# Patient Record
Sex: Male | Born: 1970 | Race: Black or African American | Hispanic: No | Marital: Single | Smoking: Former smoker
Health system: Southern US, Community
[De-identification: ages and names within clinical notes are randomized; demographics above are authoritative.]

## PROBLEM LIST (undated history)

## (undated) DIAGNOSIS — G4733 Obstructive sleep apnea (adult) (pediatric): Secondary | ICD-10-CM

## (undated) DIAGNOSIS — I10 Essential (primary) hypertension: Secondary | ICD-10-CM

## (undated) DIAGNOSIS — Z8719 Personal history of other diseases of the digestive system: Secondary | ICD-10-CM

## (undated) DIAGNOSIS — K760 Fatty (change of) liver, not elsewhere classified: Secondary | ICD-10-CM

## (undated) DIAGNOSIS — R351 Nocturia: Secondary | ICD-10-CM

## (undated) DIAGNOSIS — N529 Male erectile dysfunction, unspecified: Secondary | ICD-10-CM

## (undated) DIAGNOSIS — N433 Hydrocele, unspecified: Secondary | ICD-10-CM

## (undated) DIAGNOSIS — F329 Major depressive disorder, single episode, unspecified: Secondary | ICD-10-CM

## (undated) DIAGNOSIS — F32A Depression, unspecified: Secondary | ICD-10-CM

## (undated) DIAGNOSIS — K7460 Unspecified cirrhosis of liver: Secondary | ICD-10-CM

## (undated) DIAGNOSIS — Z973 Presence of spectacles and contact lenses: Secondary | ICD-10-CM

## (undated) DIAGNOSIS — D4959 Neoplasm of unspecified behavior of other genitourinary organ: Secondary | ICD-10-CM

## (undated) DIAGNOSIS — N503 Cyst of epididymis: Secondary | ICD-10-CM

## (undated) DIAGNOSIS — IMO0001 Reserved for inherently not codable concepts without codable children: Secondary | ICD-10-CM

## (undated) DIAGNOSIS — J40 Bronchitis, not specified as acute or chronic: Secondary | ICD-10-CM

## (undated) DIAGNOSIS — D869 Sarcoidosis, unspecified: Secondary | ICD-10-CM

## (undated) DIAGNOSIS — I4510 Unspecified right bundle-branch block: Secondary | ICD-10-CM

## (undated) DIAGNOSIS — D860 Sarcoidosis of lung: Secondary | ICD-10-CM

## (undated) DIAGNOSIS — R053 Chronic cough: Secondary | ICD-10-CM

## (undated) DIAGNOSIS — K219 Gastro-esophageal reflux disease without esophagitis: Secondary | ICD-10-CM

## (undated) DIAGNOSIS — D863 Sarcoidosis of skin: Secondary | ICD-10-CM

## (undated) DIAGNOSIS — T7840XA Allergy, unspecified, initial encounter: Secondary | ICD-10-CM

## (undated) HISTORY — PX: LUNG BIOPSY: SHX232

## (undated) HISTORY — DX: Personal history of other diseases of the digestive system: Z87.19

## (undated) HISTORY — DX: Male erectile dysfunction, unspecified: N52.9

## (undated) HISTORY — DX: Depression, unspecified: F32.A

## (undated) HISTORY — DX: Gastro-esophageal reflux disease without esophagitis: K21.9

## (undated) HISTORY — DX: Hydrocele, unspecified: N43.3

## (undated) HISTORY — DX: Major depressive disorder, single episode, unspecified: F32.9

## (undated) HISTORY — DX: Allergy, unspecified, initial encounter: T78.40XA

## (undated) HISTORY — DX: Fatty (change of) liver, not elsewhere classified: K76.0

## (undated) HISTORY — PX: OTHER SURGICAL HISTORY: SHX169

---

## 2010-02-19 ENCOUNTER — Emergency Department (HOSPITAL_BASED_OUTPATIENT_CLINIC_OR_DEPARTMENT_OTHER)
Admission: EM | Admit: 2010-02-19 | Discharge: 2010-02-19 | Payer: Self-pay | Source: Home / Self Care | Admitting: Emergency Medicine

## 2010-02-19 IMAGING — CR DG CHEST 2V
2 series · 2 of 2 positions shown · non-contrast
Comparison: None.

CLINICAL DATA: Chest pain for 1 week.  Cough.  Sarcoidosis.

CHEST - 2 VIEW

[w chest pa]
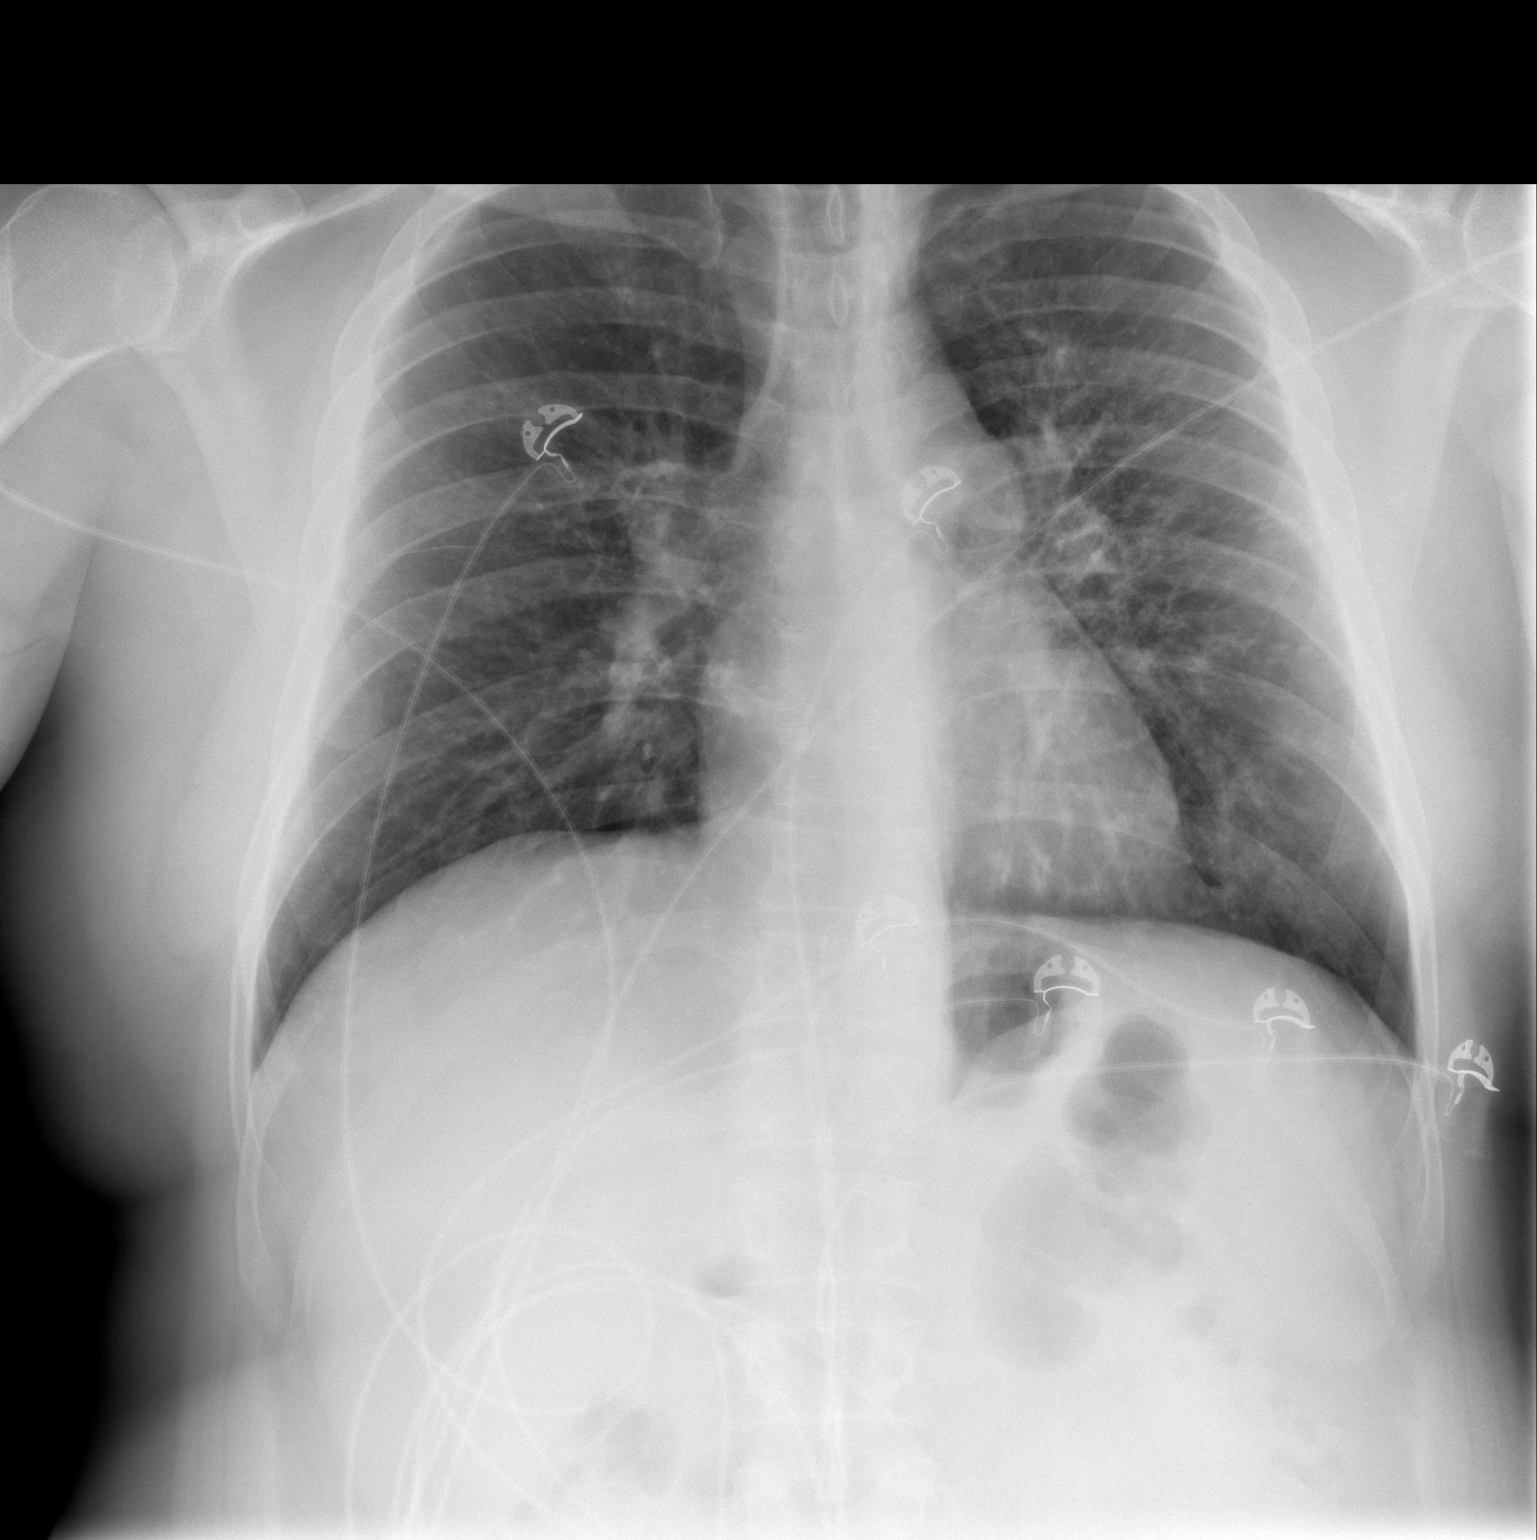

[w chest lat]
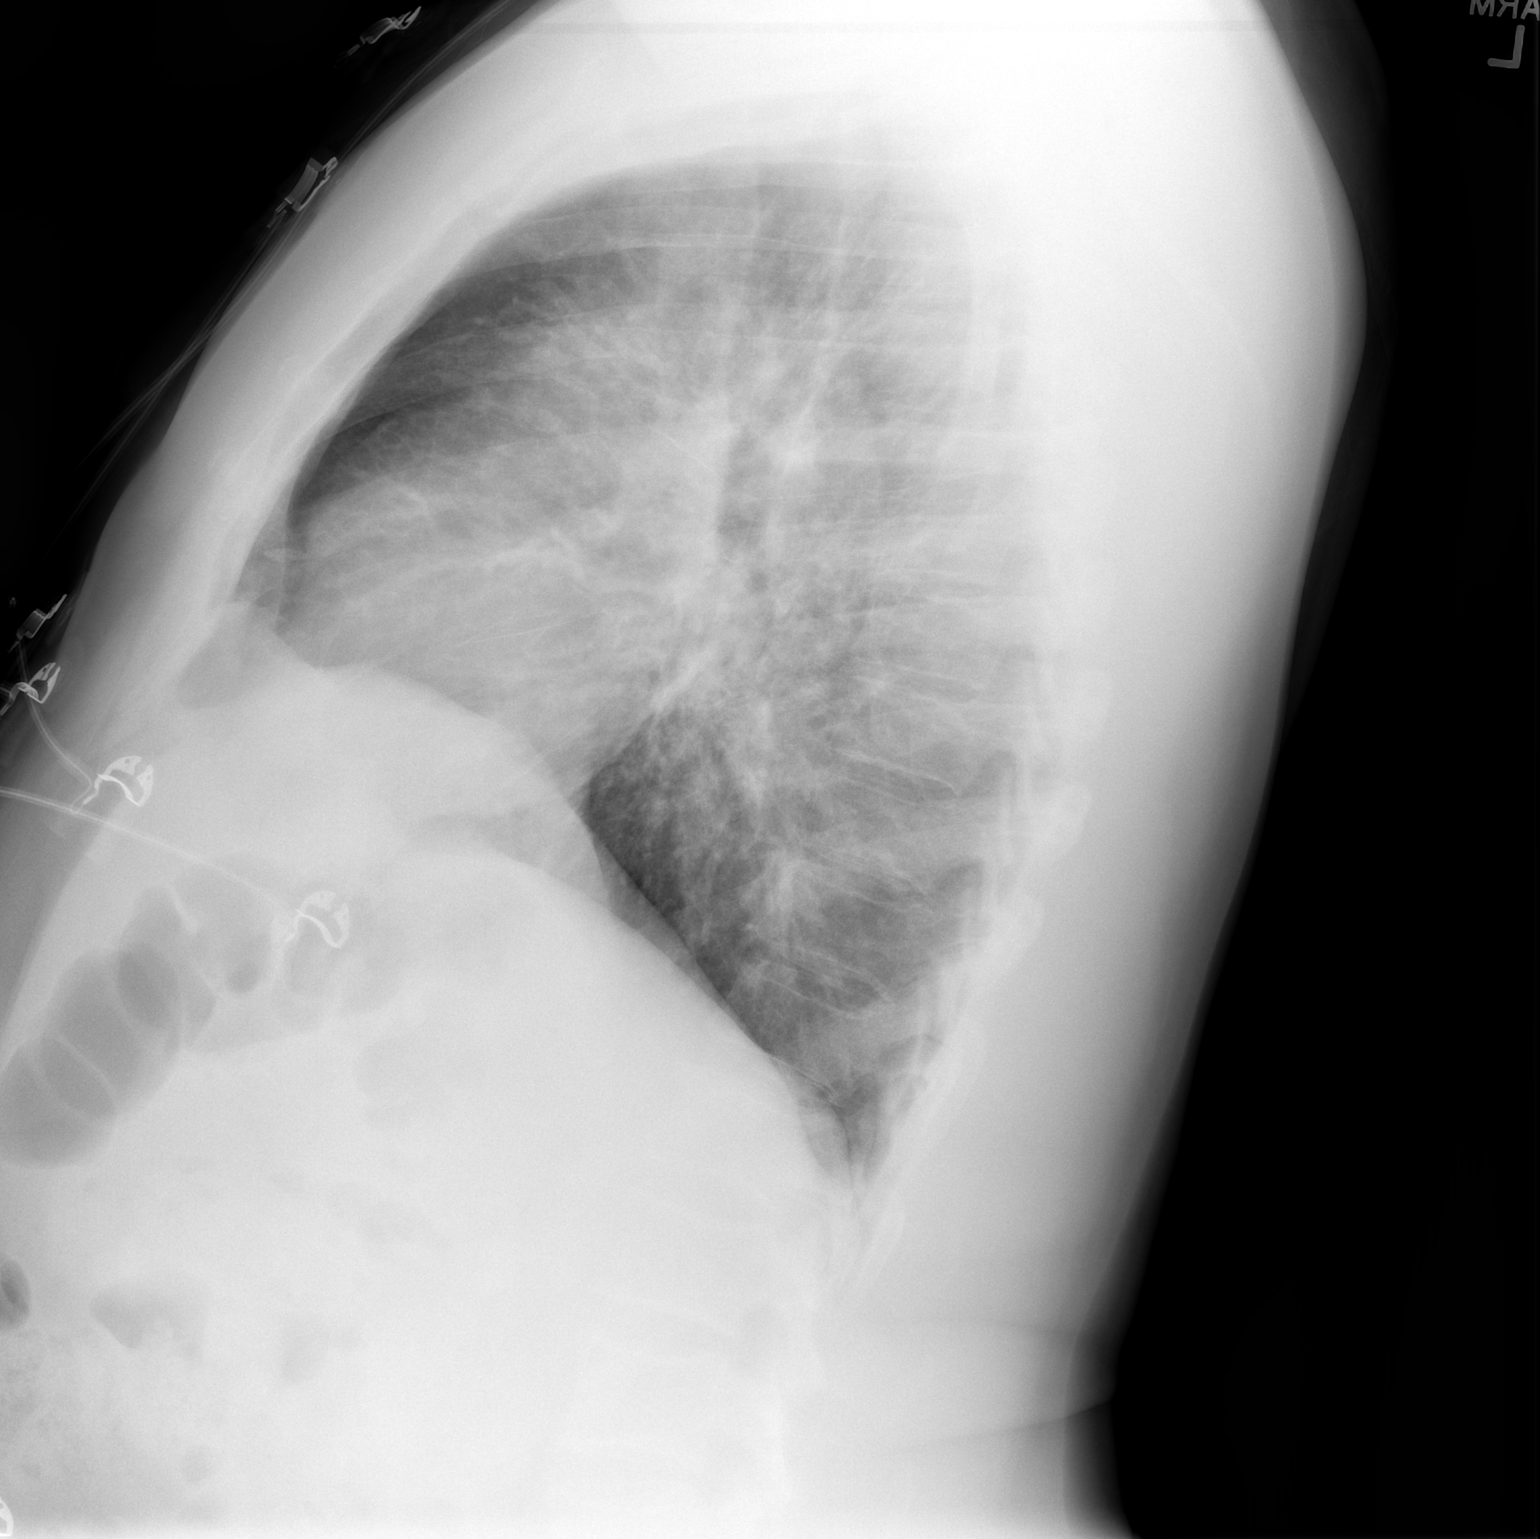

[2 of 2 positions shown; findings below may reference images not displayed]

FINDINGS: There is no comparison film available.  There is patchy
interstitial density more prominent on the left than right.  The
hila are slightly prominent, compatible with stated clinical
history of sarcoidosis.  There is no focal consolidation.
Superimposed pneumonia is difficult to exclude however the overall
appearance is most compatible with pulmonary sarcoidosis with
interstitial changes.
IMPRESSION: 1.  No definite acute cardiopulmonary disease.
2.  Interstitial prominence most compatible with sarcoidosis.
Underlying infection is impossible to exclude but not favored.

## 2010-05-07 LAB — URINALYSIS, ROUTINE W REFLEX MICROSCOPIC
Bilirubin Urine: NEGATIVE
Hgb urine dipstick: NEGATIVE
Ketones, ur: NEGATIVE mg/dL
Nitrite: NEGATIVE
Protein, ur: NEGATIVE mg/dL
Specific Gravity, Urine: 1.019 (ref 1.005–1.030)
Urobilinogen, UA: 0.2 mg/dL (ref 0.0–1.0)
pH: 5.5 (ref 5.0–8.0)

## 2010-05-07 LAB — DIFFERENTIAL
Lymphocytes Relative: 29 % (ref 12–46)
Monocytes Relative: 12 % (ref 3–12)
Neutrophils Relative %: 52 % (ref 43–77)

## 2010-05-07 LAB — CBC
HCT: 45.7 % (ref 39.0–52.0)
Hemoglobin: 15.7 g/dL (ref 13.0–17.0)
MCH: 28.6 pg (ref 26.0–34.0)
MCHC: 34.4 g/dL (ref 30.0–36.0)
MCV: 83.2 fL (ref 78.0–100.0)
RBC: 5.49 MIL/uL (ref 4.22–5.81)

## 2010-05-07 LAB — BASIC METABOLIC PANEL
BUN: 15 mg/dL (ref 6–23)
GFR calc non Af Amer: >60 mL/min (ref >60)
Glucose, Bld: 80 mg/dL (ref 70–99)
Sodium: 144 mEq/L (ref 135–145)

## 2010-08-26 ENCOUNTER — Emergency Department (INDEPENDENT_AMBULATORY_CARE_PROVIDER_SITE_OTHER): Payer: Self-pay

## 2010-08-26 ENCOUNTER — Emergency Department (HOSPITAL_BASED_OUTPATIENT_CLINIC_OR_DEPARTMENT_OTHER)
Admission: EM | Admit: 2010-08-26 | Discharge: 2010-08-26 | Disposition: A | Discharge status: Home / Self Care | Payer: Self-pay | Attending: Emergency Medicine | Admitting: Emergency Medicine

## 2010-08-26 DIAGNOSIS — R05 Cough: Secondary | ICD-10-CM | POA: Insufficient documentation

## 2010-08-26 DIAGNOSIS — R059 Cough, unspecified: Secondary | ICD-10-CM | POA: Insufficient documentation

## 2010-08-26 DIAGNOSIS — R0602 Shortness of breath: Secondary | ICD-10-CM

## 2010-08-26 DIAGNOSIS — R0789 Other chest pain: Secondary | ICD-10-CM

## 2010-08-26 DIAGNOSIS — IMO0002 Reserved for concepts with insufficient information to code with codable children: Secondary | ICD-10-CM | POA: Insufficient documentation

## 2010-08-26 DIAGNOSIS — M25569 Pain in unspecified knee: Secondary | ICD-10-CM

## 2010-08-26 DIAGNOSIS — S82839A Other fracture of upper and lower end of unspecified fibula, initial encounter for closed fracture: Secondary | ICD-10-CM | POA: Insufficient documentation

## 2010-08-26 IMAGING — CR DG KNEE COMPLETE 4+V*R*
4 series · 4 of 4 positions shown · non-contrast
Comparison: None.

CLINICAL DATA: Right knee pain following an injury 1.5 weeks ago.

RIGHT KNEE - COMPLETE 4+ VIEW

[t knee ap right]
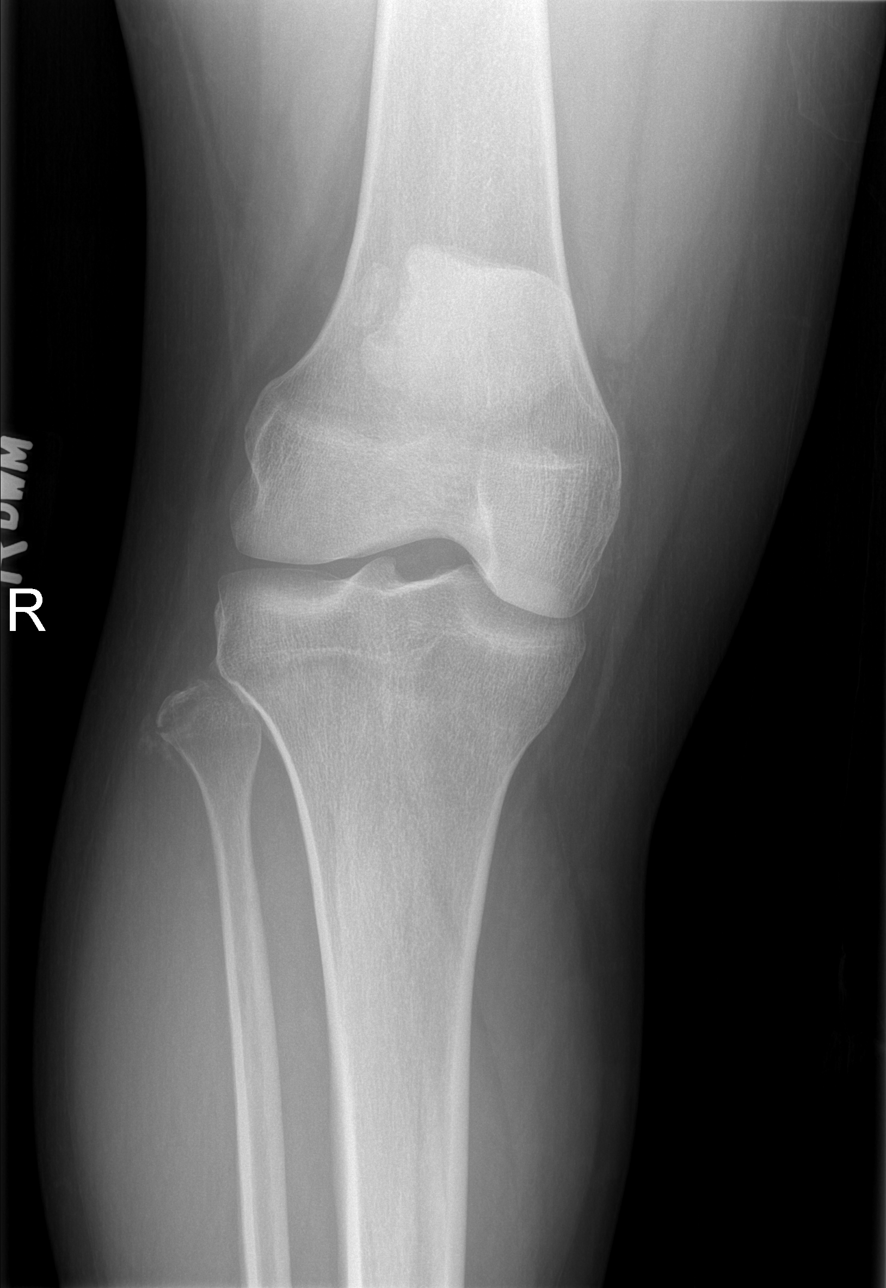

[t knee oblique right (1 of 2)]
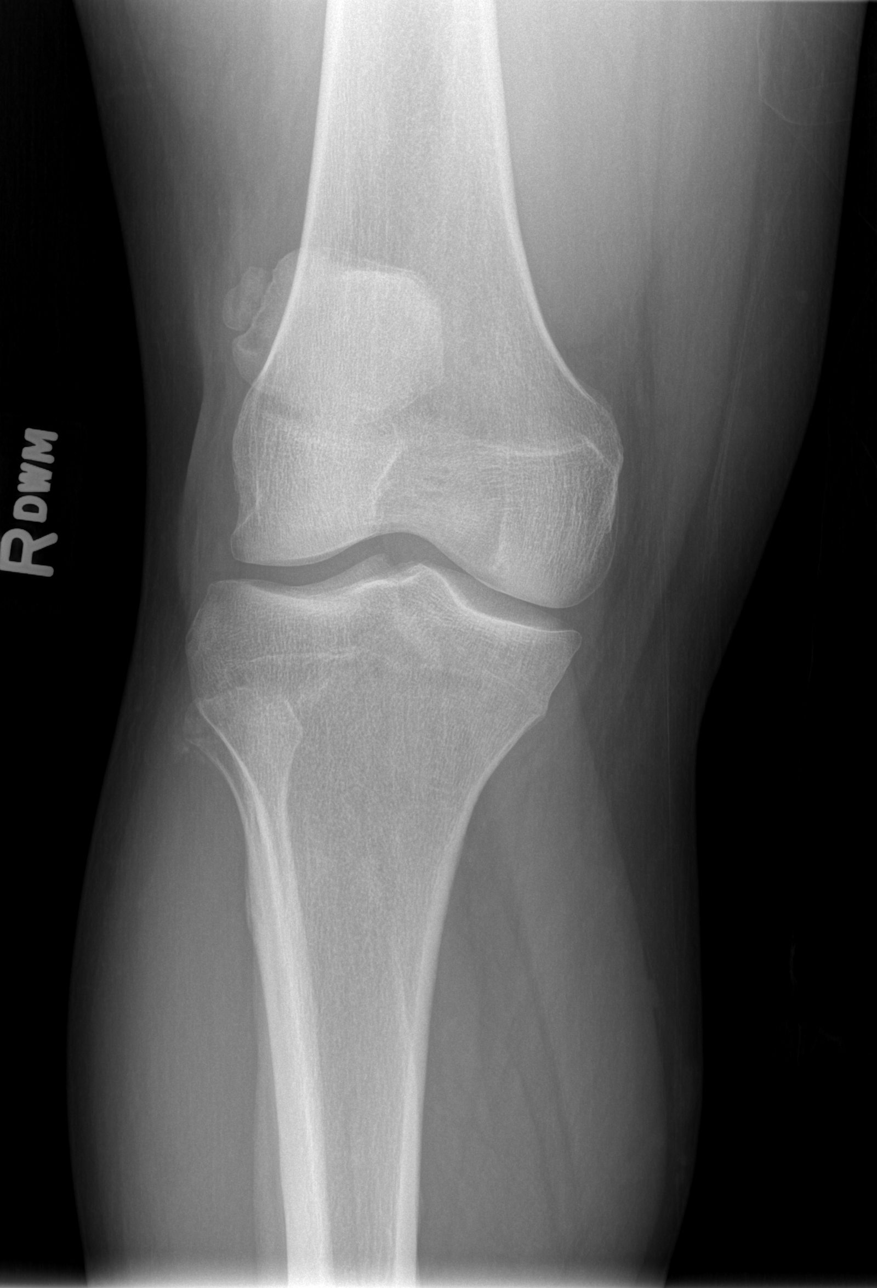

[t knee oblique right (2 of 2)]
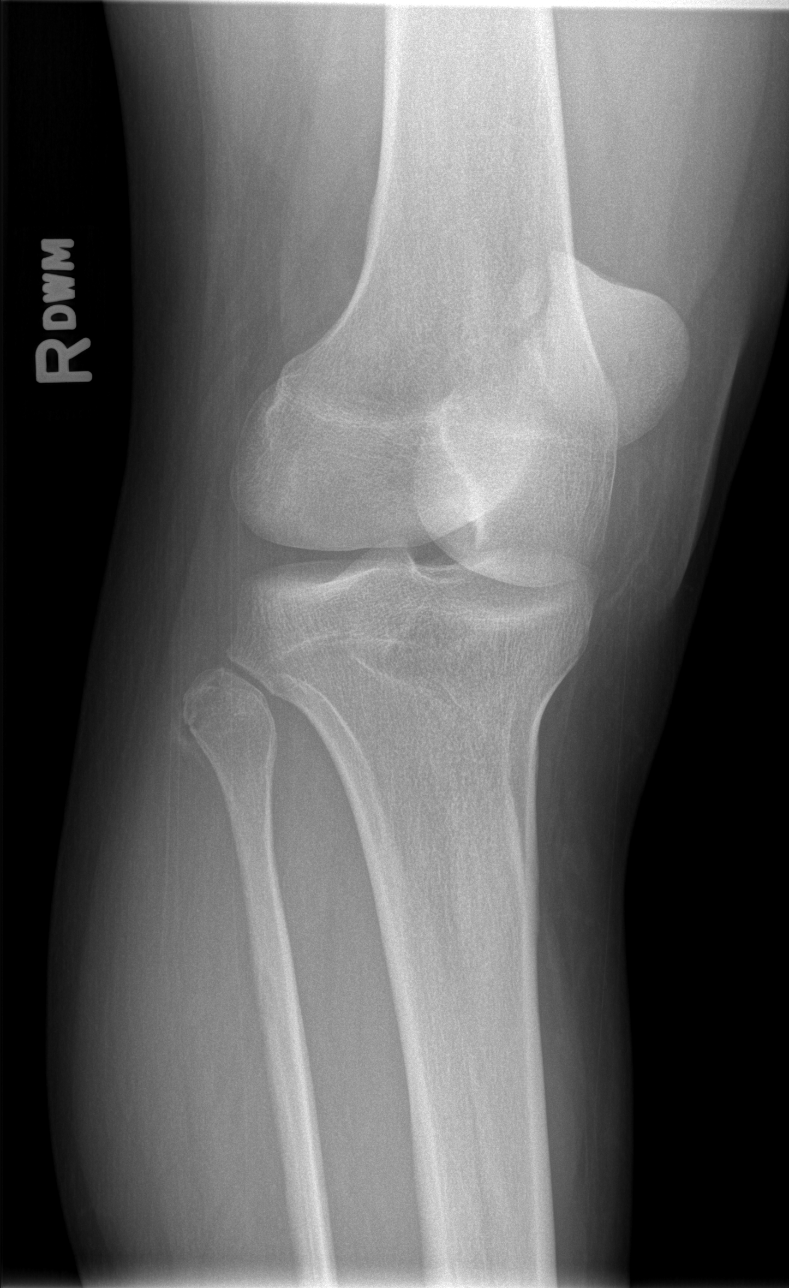

[t knee lat right]
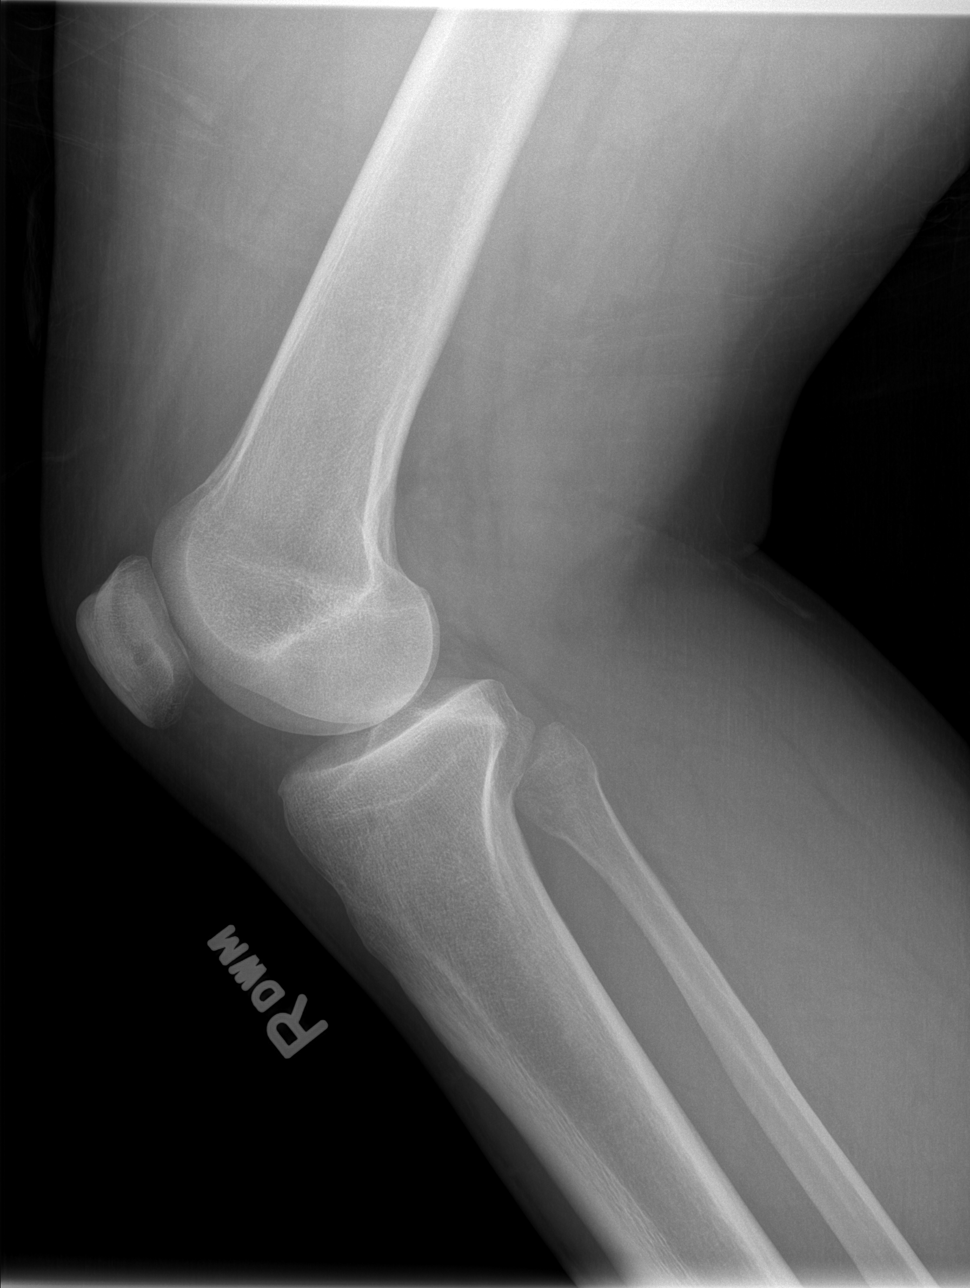

[4 of 4 positions shown; findings below may reference images not displayed]

FINDINGS: Mildly comminuted fracture of the superior aspect of the
fibular head with adjacent soft tissue calcification.  Bipartite
patella.  No effusion.
IMPRESSION: Healing fibular head fracture.

## 2010-08-26 IMAGING — CR DG CHEST 2V
2 series · 2 of 2 positions shown · non-contrast
Comparison: [DATE]

CLINICAL DATA: Chest tightness and short of breath for 3 days.
History sarcoidosis.  Ex-smoker.

CHEST - 2 VIEW

[w chest pa]
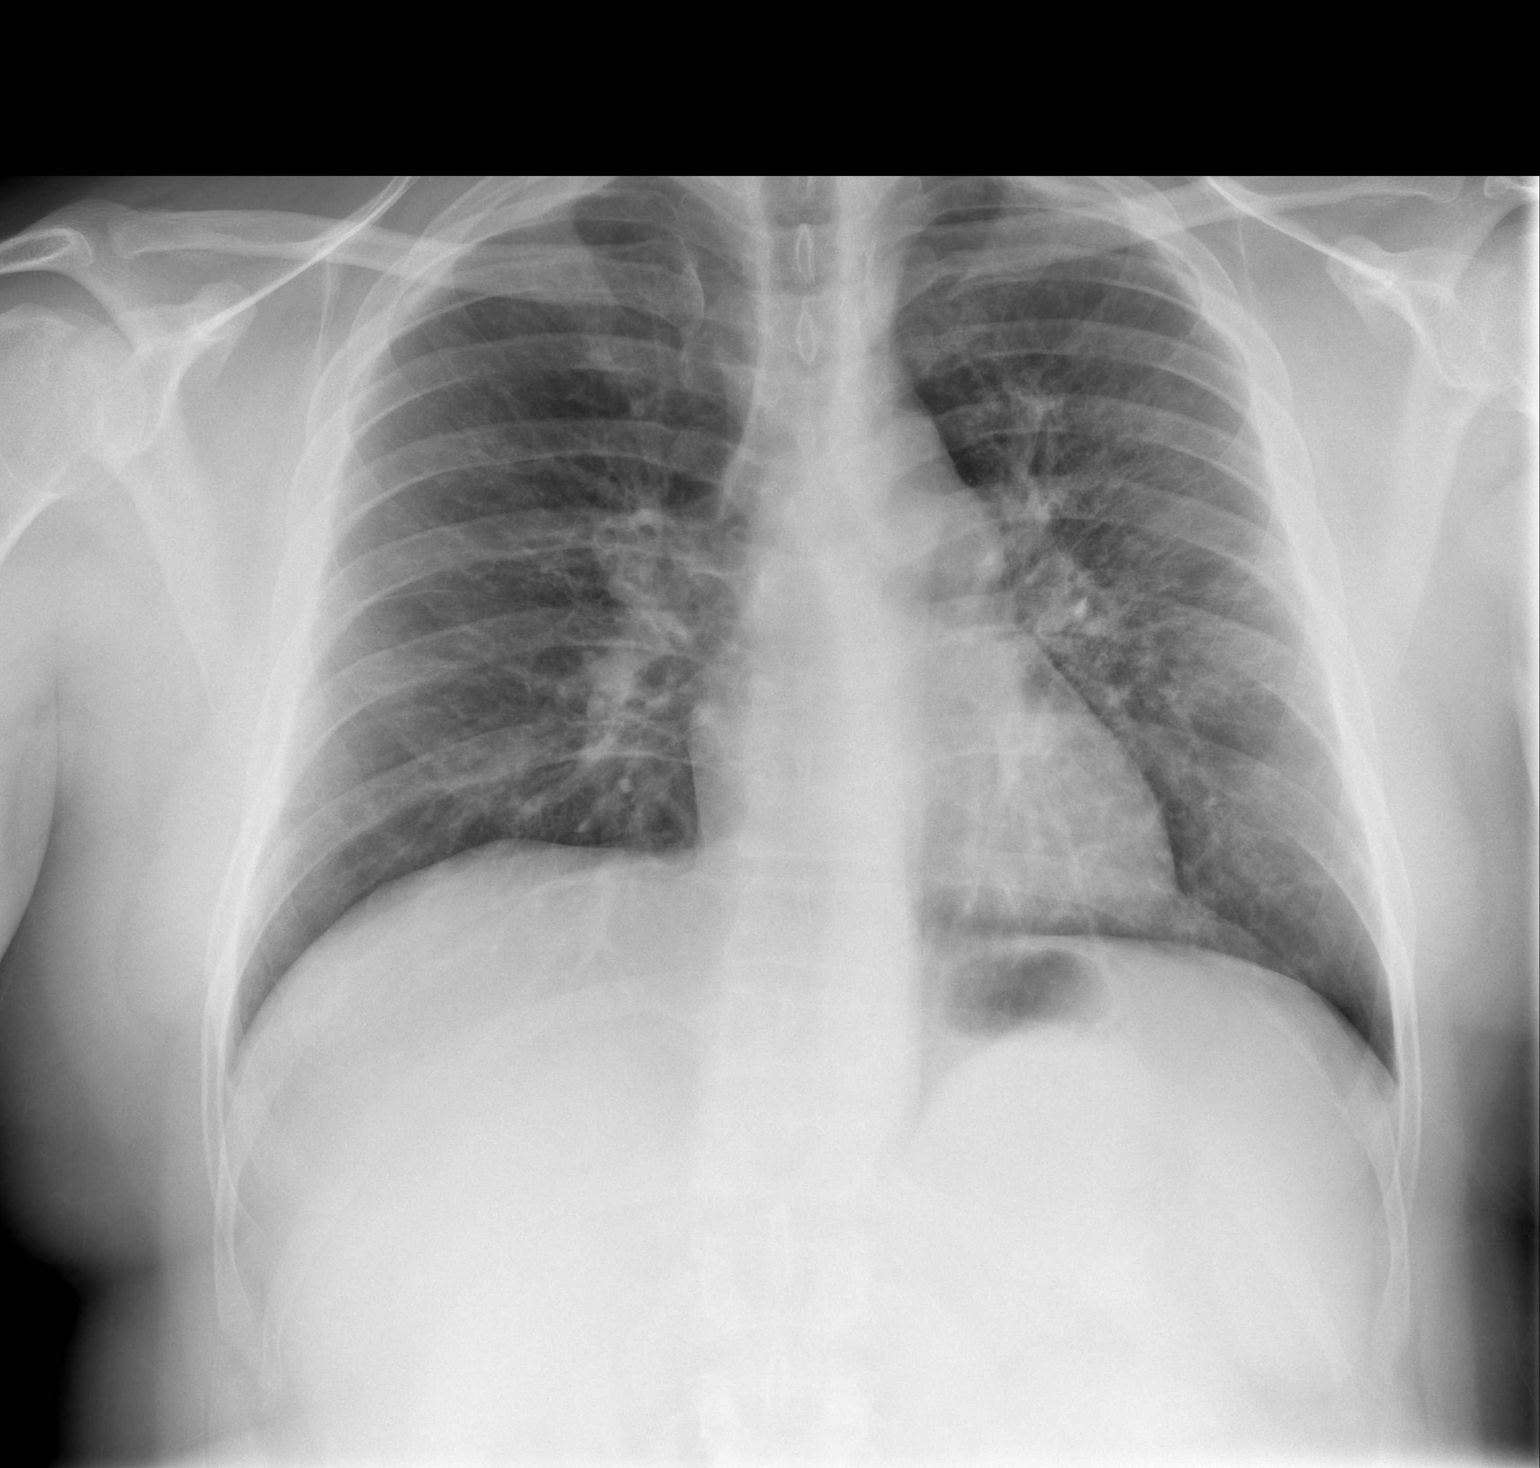

[w chest lat]
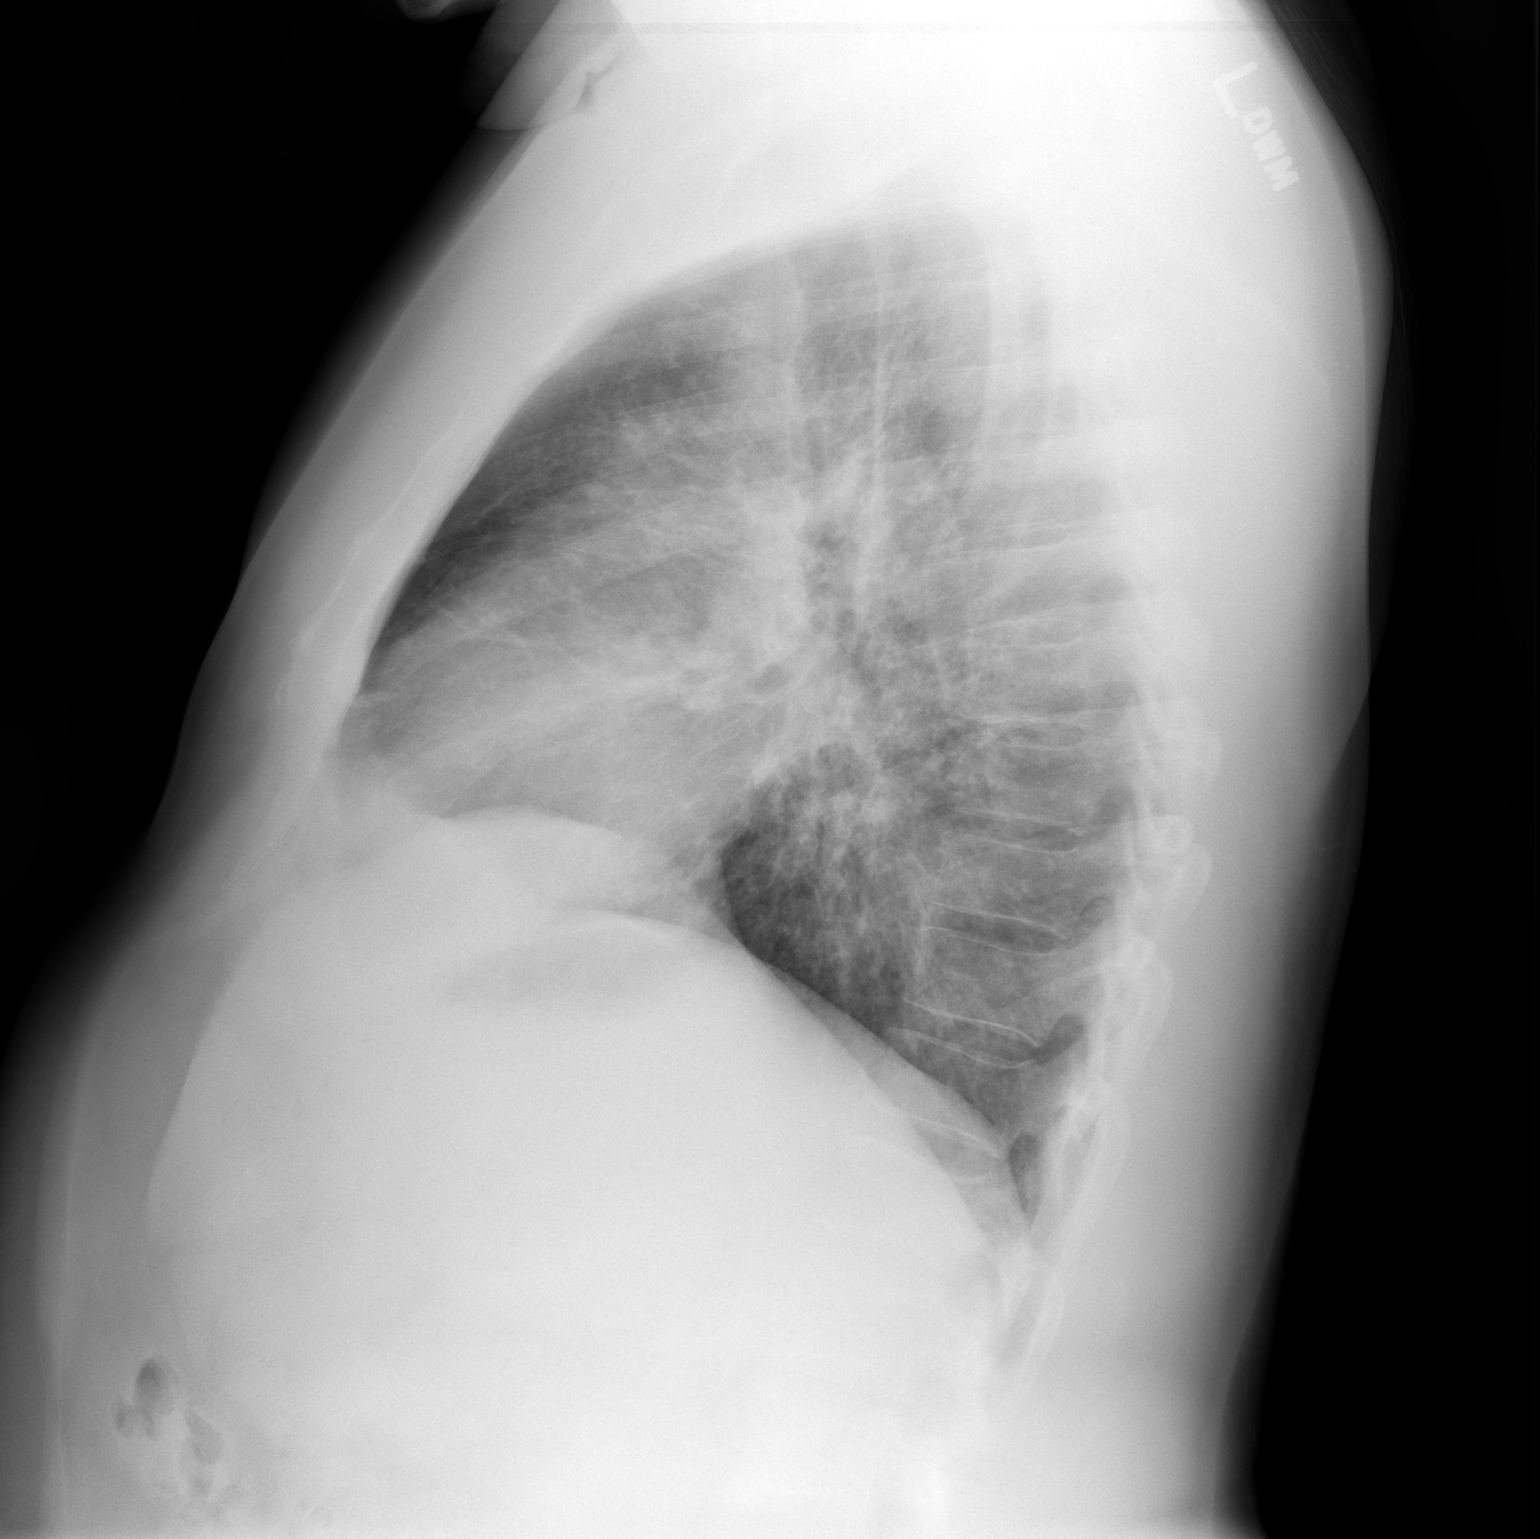

[2 of 2 positions shown; findings below may reference images not displayed]

FINDINGS: Midline trachea.  Normal heart size and mediastinal
contours. No pleural effusion or pneumothorax.  Biapical pleural
thickening.  Moderate diffuse interstitial thickening is similar to
on the prior exam.  Slightly asymmetric, and greater on the left
than right.
IMPRESSION: No change in diffuse interstitial thickening.  Likely related to
the clinical history of sarcoidosis.  No evidence of acute
superimposed process.

## 2011-01-09 ENCOUNTER — Emergency Department (HOSPITAL_BASED_OUTPATIENT_CLINIC_OR_DEPARTMENT_OTHER)
Admission: EM | Admit: 2011-01-09 | Discharge: 2011-01-09 | Disposition: A | Discharge status: Home / Self Care | Payer: Self-pay | Attending: Emergency Medicine | Admitting: Emergency Medicine

## 2011-01-09 ENCOUNTER — Emergency Department (INDEPENDENT_AMBULATORY_CARE_PROVIDER_SITE_OTHER): Payer: Self-pay

## 2011-01-09 ENCOUNTER — Other Ambulatory Visit: Payer: Self-pay

## 2011-01-09 DIAGNOSIS — R0602 Shortness of breath: Secondary | ICD-10-CM | POA: Insufficient documentation

## 2011-01-09 DIAGNOSIS — R079 Chest pain, unspecified: Secondary | ICD-10-CM | POA: Insufficient documentation

## 2011-01-09 DIAGNOSIS — R1011 Right upper quadrant pain: Secondary | ICD-10-CM

## 2011-01-09 DIAGNOSIS — R110 Nausea: Secondary | ICD-10-CM | POA: Insufficient documentation

## 2011-01-09 DIAGNOSIS — R1013 Epigastric pain: Secondary | ICD-10-CM | POA: Insufficient documentation

## 2011-01-09 DIAGNOSIS — K7689 Other specified diseases of liver: Secondary | ICD-10-CM | POA: Insufficient documentation

## 2011-01-09 HISTORY — DX: Sarcoidosis, unspecified: D86.9

## 2011-01-09 LAB — COMPREHENSIVE METABOLIC PANEL
ALT: 32 U/L (ref 0–53)
AST: 53 U/L — ABNORMAL HIGH (ref 0–37)
Alkaline Phosphatase: 83 U/L (ref 39–117)
BUN: 6 mg/dL (ref 6–23)
Calcium: 9.6 mg/dL (ref 8.4–10.5)
Sodium: 136 mEq/L (ref 135–145)
Total Bilirubin: 1.1 mg/dL (ref 0.3–1.2)
Total Protein: 8.8 g/dL — ABNORMAL HIGH (ref 6.0–8.3)

## 2011-01-09 LAB — CBC
MCH: 28.8 pg (ref 26.0–34.0)
MCV: 84.8 fL (ref 78.0–100.0)
Platelets: 210 10*3/uL (ref 150–400)
RBC: 5.6 MIL/uL (ref 4.22–5.81)

## 2011-01-09 LAB — DIFFERENTIAL
Basophils Absolute: 0 10*3/uL (ref 0.0–0.1)
Basophils Relative: 1 % (ref 0–1)
Eosinophils Absolute: 0.2 10*3/uL (ref 0.0–0.7)
Neutro Abs: 3.3 10*3/uL (ref 1.7–7.7)
Neutrophils Relative %: 59 % (ref 43–77)

## 2011-01-09 LAB — URINALYSIS, ROUTINE W REFLEX MICROSCOPIC
Bilirubin Urine: NEGATIVE
Nitrite: NEGATIVE
Protein, ur: NEGATIVE mg/dL
Urobilinogen, UA: 1 mg/dL (ref 0.0–1.0)
pH: 5.5 (ref 5.0–8.0)

## 2011-01-09 LAB — LIPASE, BLOOD: Lipase: 22 U/L (ref 11–59)

## 2011-01-09 LAB — TROPONIN I: Troponin I: <0.3 ng/mL (ref <0.30)

## 2011-01-09 LAB — D-DIMER, QUANTITATIVE: D-Dimer, Quant: <0.22 ug/mL-FEU (ref 0.00–0.48)

## 2011-01-09 IMAGING — CR DG ABDOMEN ACUTE W/ 1V CHEST
3 series · 3 of 3 positions shown · non-contrast
Comparison: [DATE]

CLINICAL DATA: Left-sided chest pain

ACUTE ABDOMEN SERIES (ABDOMEN 2 VIEW & CHEST 1 VIEW)

[w chest pa]
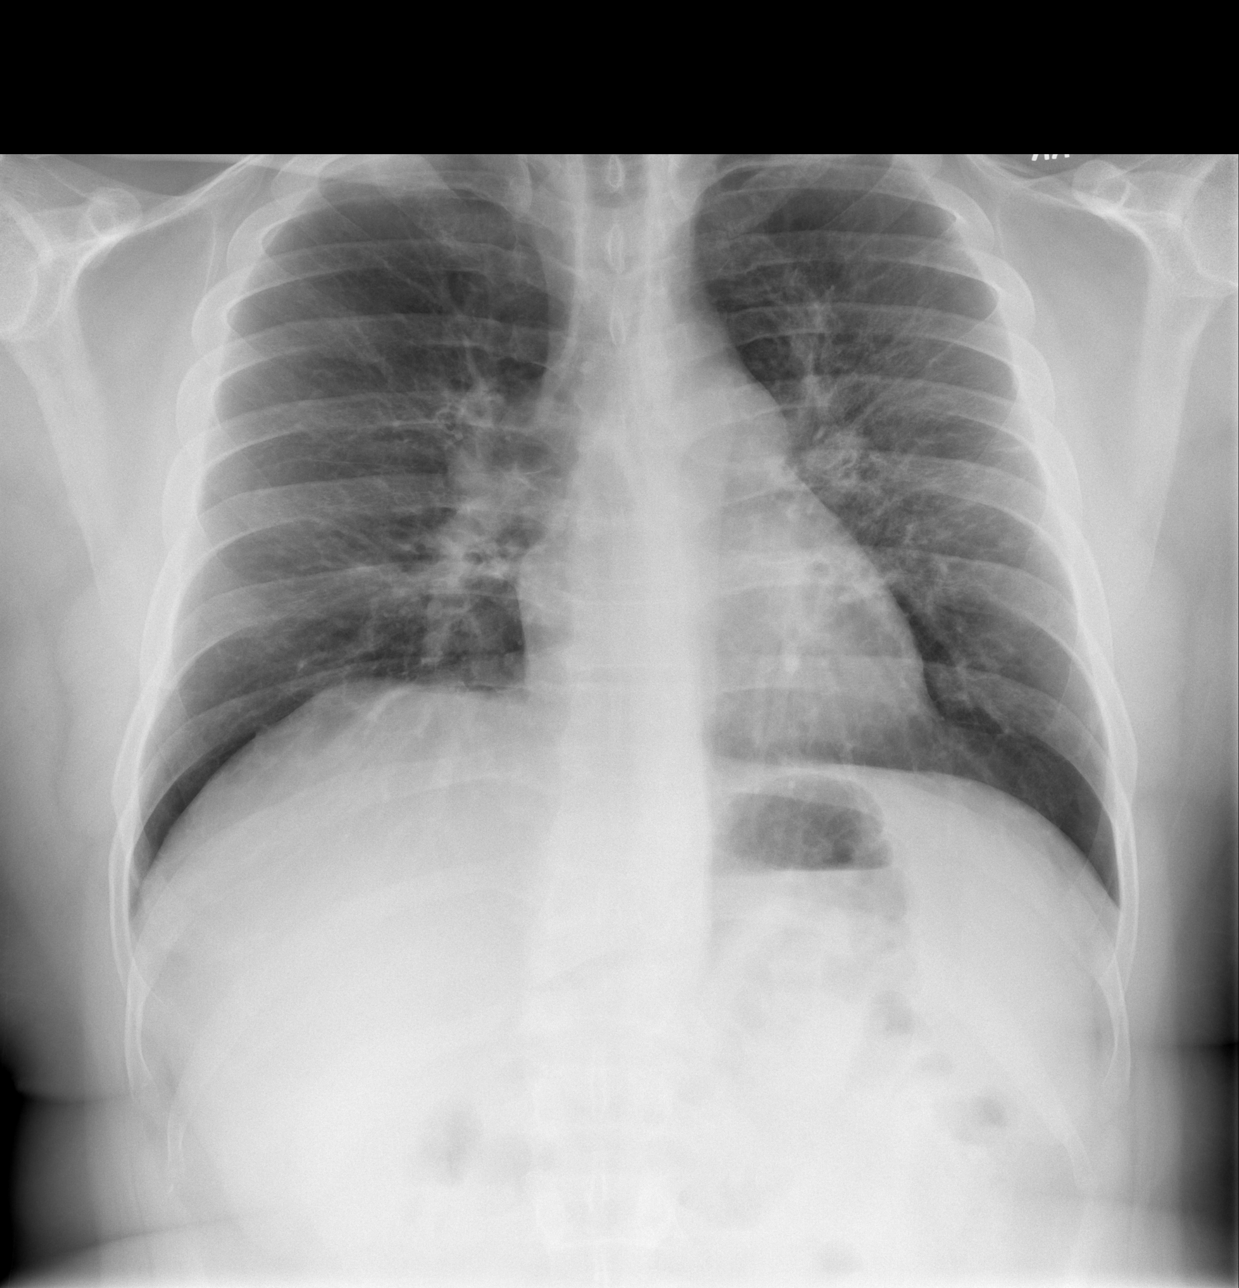

[w abdomen upright]
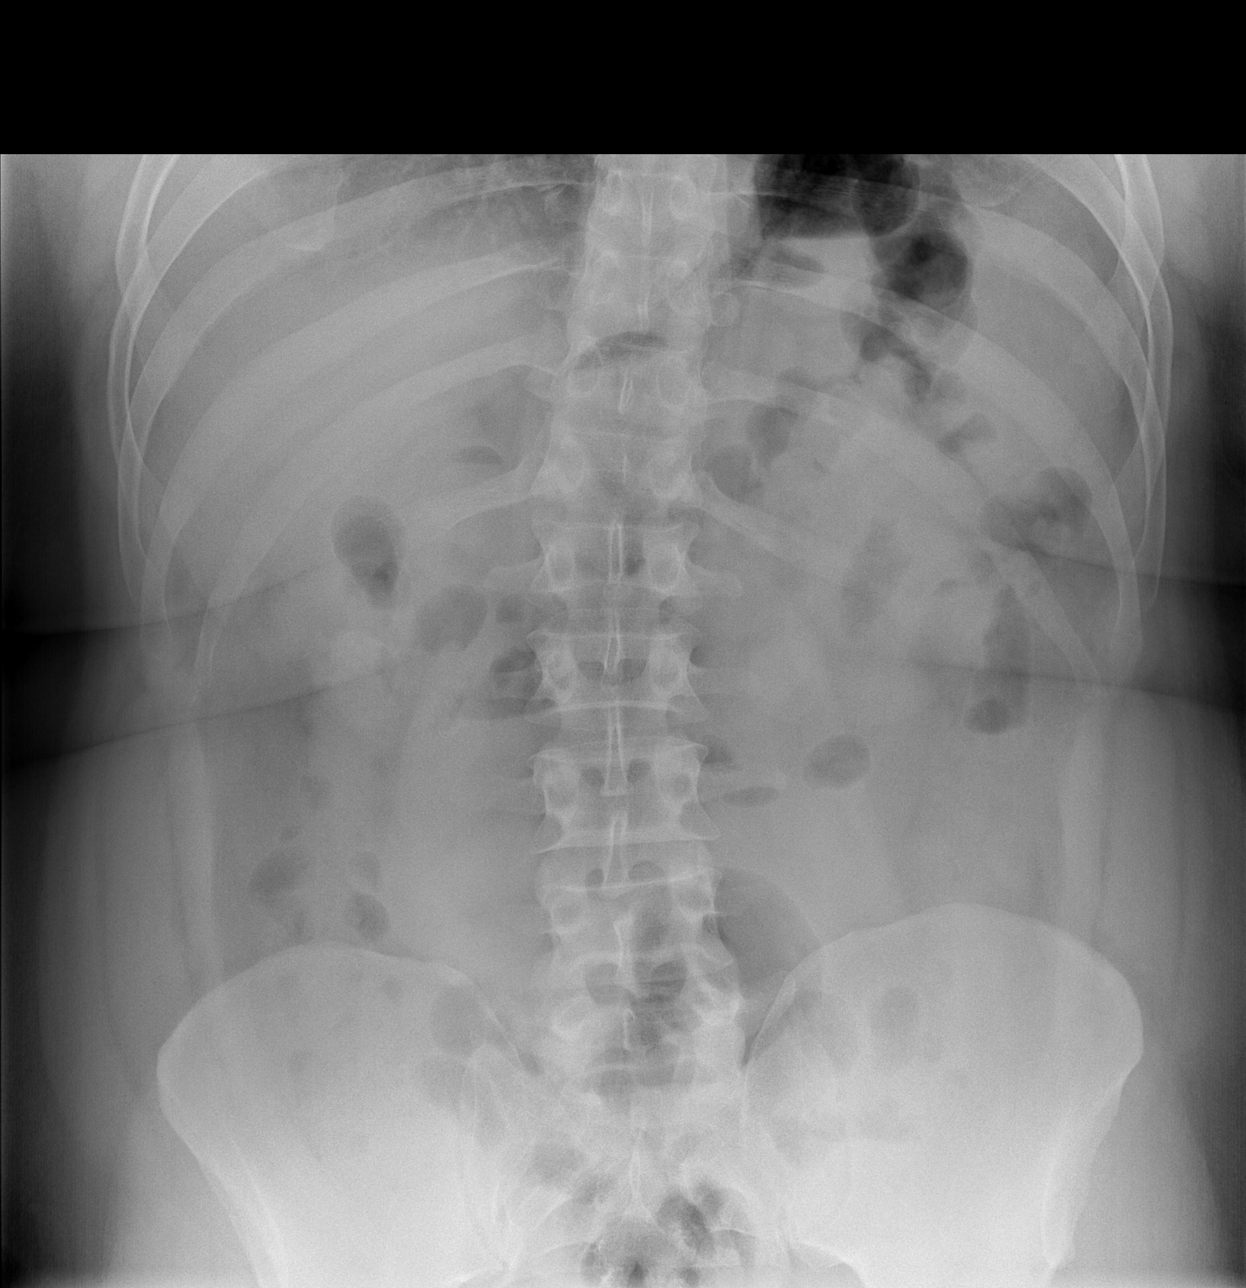

[t abdomen supine]
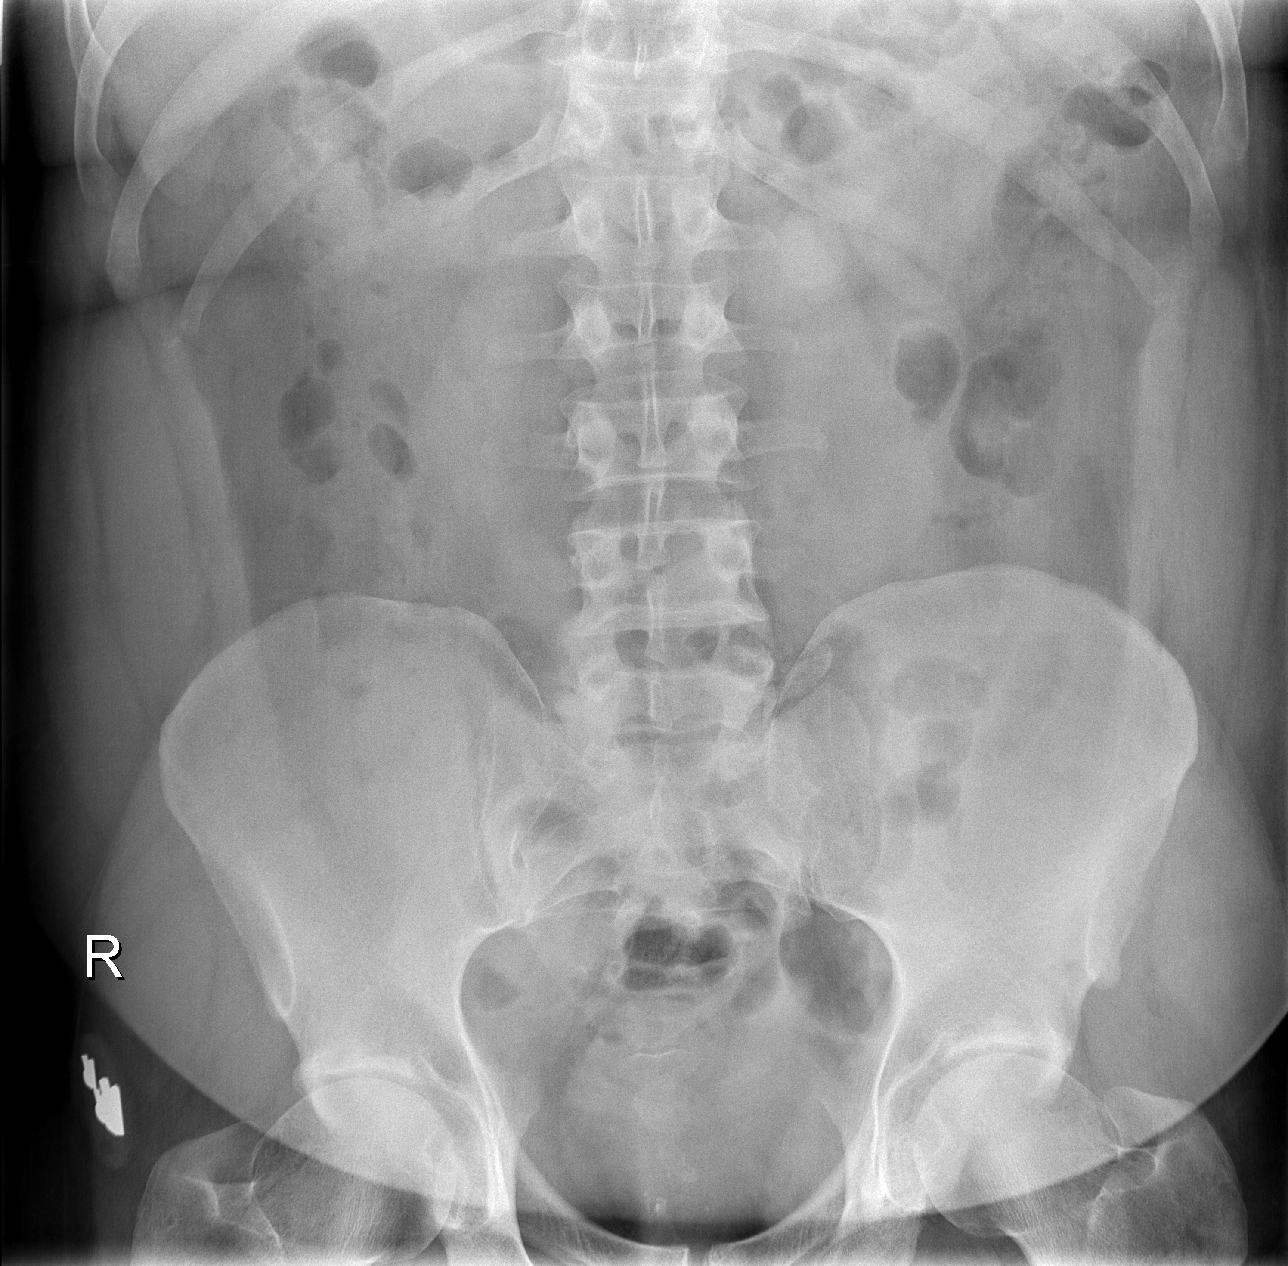

[3 of 3 positions shown; findings below may reference images not displayed]

FINDINGS: Cardiomediastinal silhouette is stable.  Stable diffuse
interstitial thickening and mild perihilar interstitial prominence.
No acute infiltrate or pulmonary edema.

There is nonspecific nonobstructive bowel gas pattern.  No free
abdominal air is noted.
IMPRESSION: Stable diffuse interstitial thickening and mild perihilar
interstitial prominence.  No acute infiltrate or pulmonary edema.
Nonspecific nonobstructive bowel gas pattern.

## 2011-01-09 IMAGING — US US ABDOMEN COMPLETE
1 series · 14 of 25 positions shown · non-contrast
Comparison: Radiography same day

CLINICAL DATA: Right upper quadrant pain.  Epigastric pain.

COMPLETE ABDOMINAL ULTRASOUND

[Series 1: us abdomen complete · 0.32mm/px · 14 of 80 slices shown]
[im 1/80]
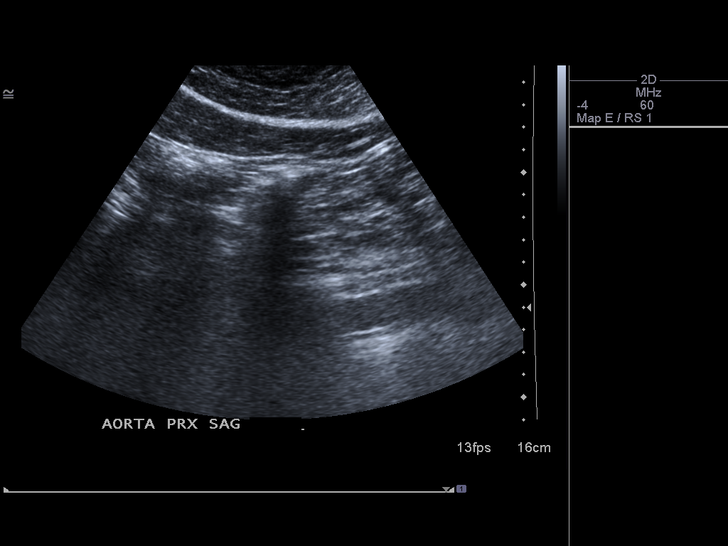
[im 7/80]
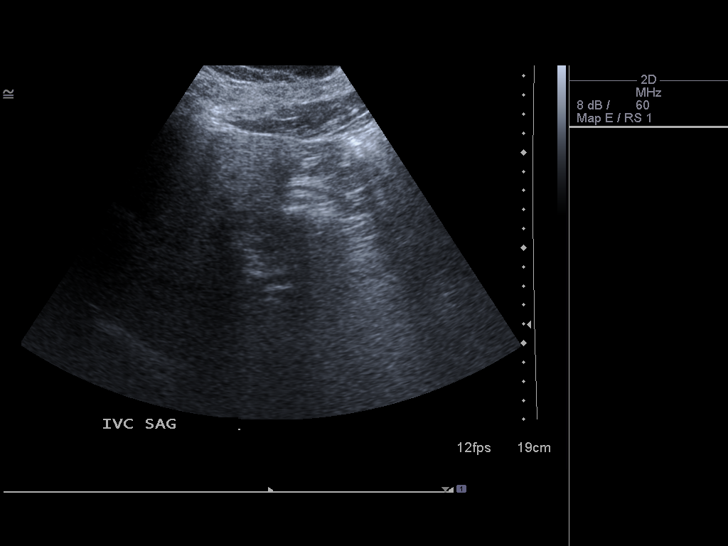
[im 14/80]
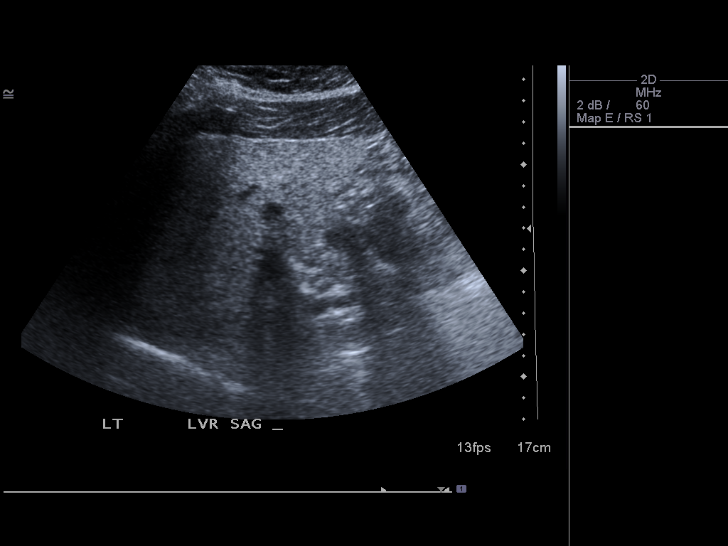
[im 20/80]
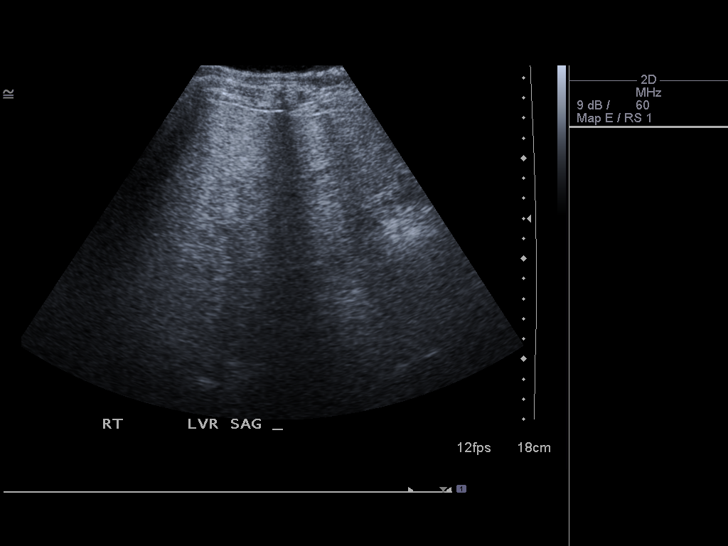
[im 27/80]
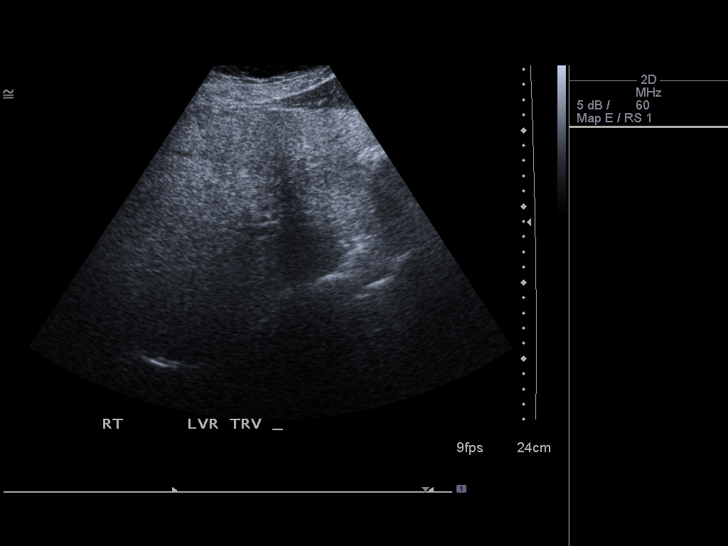
[im 30/80]
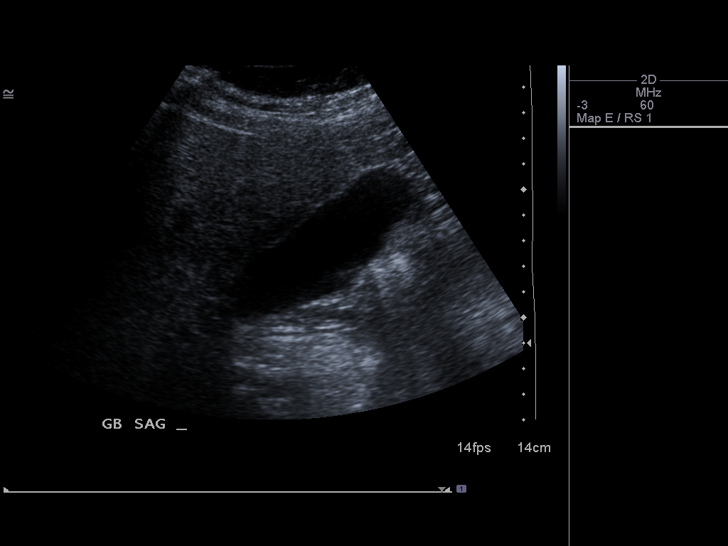
[im 37/80]
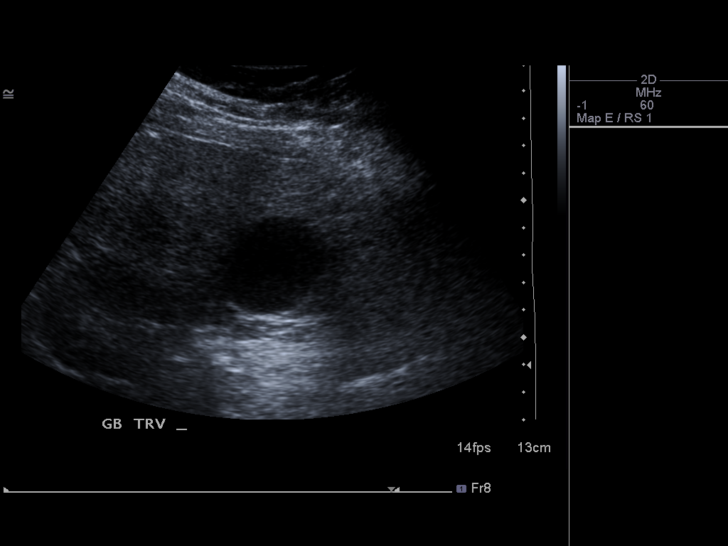
[im 43/80]
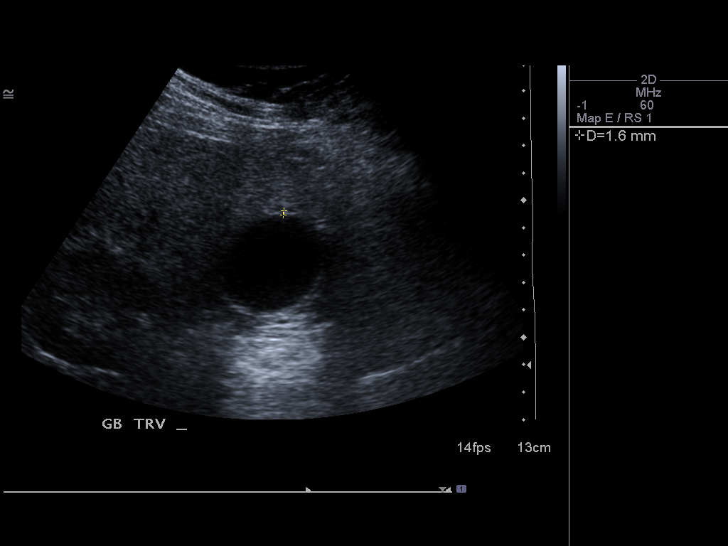
[im 50/80]
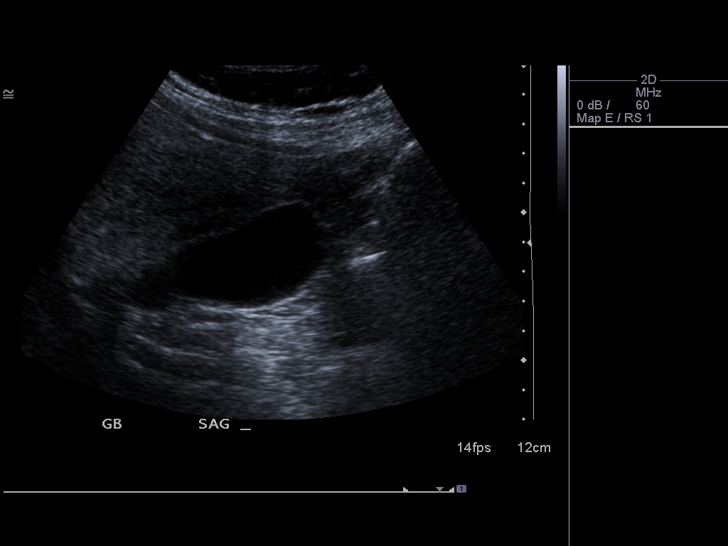
[im 53/80]
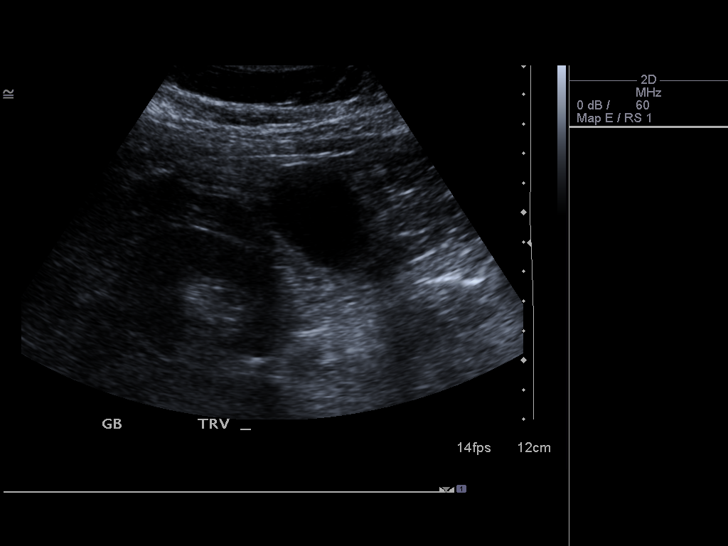
[im 60/80]
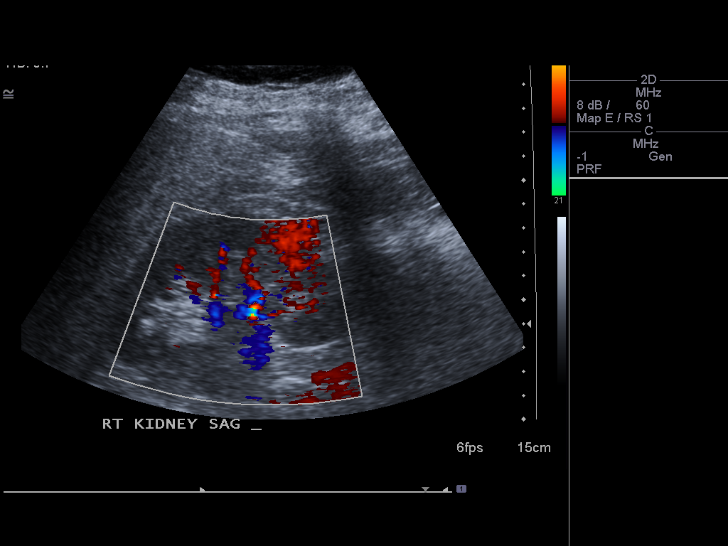
[im 66/80]
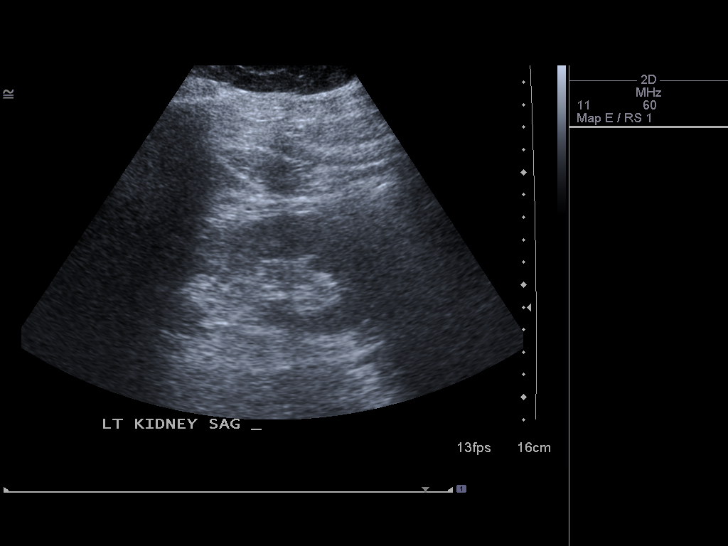
[im 73/80]
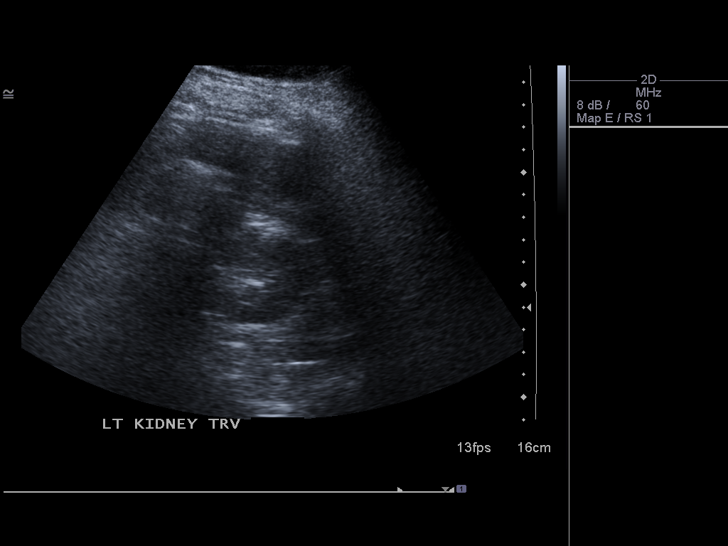
[im 80/80]
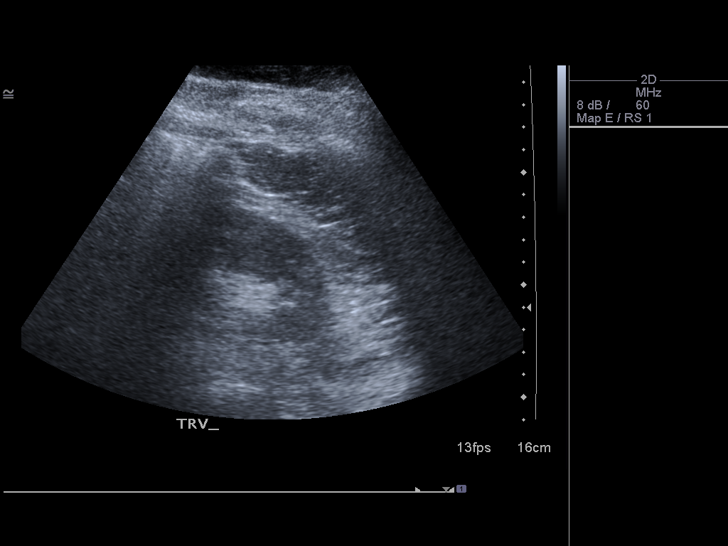

[14 of 25 positions shown; findings below may reference images not displayed]

FINDINGS: Gallbladder:  Normal without stones, sludge or wall thickening.

Common bile duct:  Normal at 2 mm.

Liver:  Echogenic suggesting fatty change.  No focal lesion or
biliary ductal dilatation.

IVC:  Poorly seen because of overlying bowel gas.

Pancreas:  Poorly seen because of overlying bowel gas.

Spleen:  Normal at 10.8 cm.

Right Kidney:  Normal 11.0 cm.  No cyst, mass, stone or
hydronephrosis.

Left Kidney:  Similarly normal at 10.8 cm.

Abdominal aorta:  Poorly seen because of overlying bowel gas.  No
aneurysm identified.

No ascites
IMPRESSION: Fatty liver.  No evidence of biliary tract disease.

Poor visualization of the IVC, pancreas and aorta because of
overlying bowel gas.

## 2011-01-09 MED ORDER — ASPIRIN 325 MG PO TABS
325.0000 mg | ORAL_TABLET | Freq: Once | ORAL | Status: AC
  Administered 2011-01-09: 325 mg via ORAL
  Filled 2011-01-09: qty 1

## 2011-01-09 MED ORDER — ALBUTEROL SULFATE (5 MG/ML) 0.5% IN NEBU
2.5000 mg | INHALATION_SOLUTION | Freq: Once | RESPIRATORY_TRACT | Status: AC
  Administered 2011-01-09: 2.5 mg via RESPIRATORY_TRACT
  Filled 2011-01-09: qty 0.5

## 2011-01-09 MED ORDER — PANTOPRAZOLE SODIUM 40 MG IV SOLR
80.0000 mg | Freq: Once | INTRAVENOUS | Status: AC
  Administered 2011-01-09: 80 mg via INTRAVENOUS
  Filled 2011-01-09: qty 40

## 2011-01-09 MED ORDER — MORPHINE SULFATE 4 MG/ML IJ SOLN
4.0000 mg | Freq: Once | INTRAMUSCULAR | Status: AC
  Administered 2011-01-09: 4 mg via INTRAVENOUS
  Filled 2011-01-09: qty 1

## 2011-01-09 MED ORDER — ONDANSETRON HCL 4 MG/2ML IJ SOLN
4.0000 mg | Freq: Once | INTRAMUSCULAR | Status: AC
  Administered 2011-01-09: 4 mg via INTRAVENOUS
  Filled 2011-01-09: qty 2

## 2011-01-09 MED ORDER — PANTOPRAZOLE SODIUM 20 MG PO TBEC: 20.0000 mg | DELAYED_RELEASE_TABLET | Freq: Every day | ORAL | Status: DC

## 2011-01-09 NOTE — ED Provider Notes (Signed)
History     CSN: [id number] Arrival date & time: 01/09/2011  2:28 PM   First MD Initiated Contact with Patient 01/09/11 1436      Chief Complaint  Patient presents with  . Chest Pain  . Abdominal Pain    (Consider location/radiation/quality/duration/timing/severity/associated sxs/prior treatment) HPI 40 yo M with sarcoidosis presents for evaluation of epigastric abdominal pain and left-sided chest pain for the past week.  Paient has associated cough, SOB, and anorexia with this.  He is not on home O2 but is on prednisone 40 mg po qday.  He has no recent hospitaliaqtions and no history of PE or DVT.  He has had multiple episodes of PNA in the past but says that this feels different.  He denies fever but reports occasional streaks of blood in his stools.  He has not taken anything for his pain.  He does feel the abdominal pain is worse with eating.  He has no prior abdominal surgeries.  He says his pain is an 8/10.  There are not other alleviating or exacerbating factors he can think of.  He denies radiation of his pain and feels like it has gradually gotten worse.  There are not other modifying factor. Past Medical History  Diagnosis Date  . Sarcoidosis     Past Surgical History  Procedure Date  . Arm surgery     History reviewed. No pertinent family history.  History  Substance Use Topics  . Smoking status: Former Games developer  . Smokeless tobacco: Not on file  . Alcohol Use: Yes      Review of Systems  Constitutional: Positive for appetite change.  HENT: Negative.   Eyes: Negative.   Respiratory: Positive for shortness of breath.   Cardiovascular: Positive for chest pain.  Gastrointestinal: Positive for nausea, abdominal pain and blood in stool. Negative for vomiting, diarrhea and constipation.  Genitourinary: Negative.   Musculoskeletal: Negative.   Skin: Negative.   Neurological: Negative.   Hematological: Negative.   Psychiatric/Behavioral: Negative.   All other  systems reviewed and are negative.    Allergies  Review of patient's allergies indicates no known allergies.  Home Medications   Current Outpatient Rx  Name Route Sig Dispense Refill  . ALBUTEROL SULFATE HFA 108 (90 BASE) MCG/ACT IN AERS Inhalation Inhale 2 puffs into the lungs every 6 (six) hours as needed. For shortness of breath and wheezing     . VENTOLIN HFA IN Inhalation Inhale into the lungs as needed.     Marland Kitchen HYDROCODONE-ACETAMINOPHEN 5-325 MG PO TABS Oral Take 1 tablet by mouth every 6 (six) hours as needed.      Marland Kitchen PREDNISONE 20 MG PO TABS Oral Take 20 mg by mouth 2 (two) times daily.      Marland Kitchen PANTOPRAZOLE SODIUM 20 MG PO TBEC Oral Take 1 tablet (20 mg total) by mouth daily. 30 tablet 0    BP 125/103  Pulse 92  Temp(Src) 98.6 F (37 C) (Oral)  Resp 20  Ht 5\' 9"  (1.753 m)  Wt 260 lb (117.935 kg)  BMI 38.40 kg/m2  SpO2 99%  Physical Exam  Nursing note and vitals reviewed. Constitutional: He is oriented to person, place, and time. He appears well-developed and well-nourished. No distress.  HENT:  Head: Normocephalic and atraumatic.  Eyes: Conjunctivae and EOM are normal. Pupils are equal, round, and reactive to light.  Neck: Normal range of motion.  Cardiovascular: Normal rate, regular rhythm, normal heart sounds and intact distal pulses.  Exam reveals no  gallop and no friction rub.   No murmur heard. Pulmonary/Chest: Effort normal and breath sounds normal. No respiratory distress. He has no wheezes. He has no rales.  Abdominal: Soft. Bowel sounds are normal. He exhibits no distension. There is tenderness in the epigastric area. There is no rigidity, no rebound, no guarding and negative Murphy's sign.  Musculoskeletal: Normal range of motion. He exhibits no edema and no tenderness.  Neurological: He is alert and oriented to person, place, and time. No cranial nerve deficit. He exhibits normal muscle tone. Coordination normal.  Skin: Skin is warm and dry. No rash noted.    Psychiatric: He has a normal mood and affect.    ED Course  Procedures (including critical care time)  Date: 01/09/2011  Rate: 81Rhythm: normal sinus rhythm  QRS Axis: normal  Intervals: normal  ST/T Wave abnormalities: nonspecific T wave changes  Conduction Disutrbances:none  Narrative Interpretation: no acute ischemic changes  Old EKG Reviewed: unchanged  Labs Reviewed  CARDIAC PANEL(CRET KIN+CKTOT+MB+TROPI) - Abnormal; Notable for the following:    Total CK 831 (*)    CK, MB 6.6 (*)    All other components within normal limits  COMPREHENSIVE METABOLIC PANEL - Abnormal; Notable for the following:    Glucose, Bld 126 (*)    Total Protein 8.8 (*)    AST 53 (*)    All other components within normal limits  URINALYSIS, ROUTINE W REFLEX MICROSCOPIC - Abnormal; Notable for the following:    Ketones, ur 15 (*)    All other components within normal limits  DIFFERENTIAL - Abnormal; Notable for the following:    Monocytes Relative 13 (*)    All other components within normal limits  CBC  LIPASE, BLOOD  PRO B NATRIURETIC PEPTIDE  D-DIMER, QUANTITATIVE  TROPONIN I   US Abdomen Complete  01/09/2011  *RADIOLOGY REPORT*  Clinical Data:  Right upper quadrant pain.  Epigastric pain.  COMPLETE ABDOMINAL ULTRASOUND  Comparison:  Radiography same day  Findings:  Gallbladder:  Normal without stones, sludge or wall thickening.  Common bile duct:  Normal at 2 mm.  Liver:  Echogenic suggesting fatty change.  No focal lesion or biliary ductal dilatation.  IVC:  Poorly seen because of overlying bowel gas.  Pancreas:  Poorly seen because of overlying bowel gas.  Spleen:  Normal at 10.8 cm.  Right Kidney:  Normal 11.0 cm.  No cyst, mass, stone or hydronephrosis.  Left Kidney:  Similarly normal at 10.8 cm.  Abdominal aorta:  Poorly seen because of overlying bowel gas.  No aneurysm identified.  No ascites  IMPRESSION: Fatty liver.  No evidence of biliary tract disease.  Poor visualization of the IVC,  pancreas and aorta because of overlying bowel gas.  Original Report Authenticated By: Thomasenia Sales, M.D.   Dg Abd Acute W/chest  01/09/2011  *RADIOLOGY REPORT*  Clinical Data: Left-sided chest pain  ACUTE ABDOMEN SERIES (ABDOMEN 2 VIEW & CHEST 1 VIEW)  Comparison: 08/26/2010  Findings: Cardiomediastinal silhouette is stable.  Stable diffuse interstitial thickening and mild perihilar interstitial prominence. No acute infiltrate or pulmonary edema.  There is nonspecific nonobstructive bowel gas pattern.  No free abdominal air is noted.  IMPRESSION: Stable diffuse interstitial thickening and mild perihilar interstitial prominence.  No acute infiltrate or pulmonary edema. Nonspecific nonobstructive bowel gas pattern.  Original Report Authenticated By: Natasha Mead, M.D.     1. Epigastric abdominal pain       MDM  Paient was evaluated by myself.  He  had an ECG that was unchanged from previous.  Given his chest pain a TNI, CBC, and d-imer were ordered.  CK and CK-MB were already ordered per protocol and were mildly abnormal owing mainly to patient elevated total CK.  Paient did receive ASA.  Paient was given morphine and nausea medicine.  D-dimer and CXR were unremarkable.  Given abdominal pain protonix was given.  CBC, lipase, UA, and LFT's were unrevealing.  Patient was given a gI cocktail.  He had an US of the RUQ given the timing of his symptoms with eating and this was normal.  While here patient was also given a albuterol neb with improvement of his SOB as well.  Paient overall felt much better and had a completely benign work-up.  I spoke with his pulmonologist, Dr. Ysidro Evert who was in agreement that patient could stay on current prednisone.  Final TNI at 3 hours was normal.  Since patient improved mainly with GI cocktail and protonix a Rx for protonix was given.  Paient can follow-up with his PCP or return for other emergent concerns.       Cyndra Numbers, MD 01/09/11 [phone]

## 2011-01-09 NOTE — ED Notes (Signed)
C/o left side CP-and mid abd pain x 1 week

## 2011-03-25 ENCOUNTER — Emergency Department (HOSPITAL_BASED_OUTPATIENT_CLINIC_OR_DEPARTMENT_OTHER)
Admission: EM | Admit: 2011-03-25 | Discharge: 2011-03-25 | Disposition: A | Discharge status: Home / Self Care | Payer: Self-pay | Attending: Emergency Medicine | Admitting: Emergency Medicine

## 2011-03-25 ENCOUNTER — Emergency Department (INDEPENDENT_AMBULATORY_CARE_PROVIDER_SITE_OTHER): Payer: Self-pay

## 2011-03-25 ENCOUNTER — Encounter (HOSPITAL_BASED_OUTPATIENT_CLINIC_OR_DEPARTMENT_OTHER): Payer: Self-pay

## 2011-03-25 DIAGNOSIS — R509 Fever, unspecified: Secondary | ICD-10-CM

## 2011-03-25 DIAGNOSIS — R05 Cough: Secondary | ICD-10-CM

## 2011-03-25 DIAGNOSIS — J069 Acute upper respiratory infection, unspecified: Secondary | ICD-10-CM

## 2011-03-25 DIAGNOSIS — J111 Influenza due to unidentified influenza virus with other respiratory manifestations: Secondary | ICD-10-CM | POA: Insufficient documentation

## 2011-03-25 DIAGNOSIS — R079 Chest pain, unspecified: Secondary | ICD-10-CM | POA: Insufficient documentation

## 2011-03-25 IMAGING — CR DG CHEST 2V
2 series · 2 of 2 positions shown · non-contrast
Comparison: [DATE]

CLINICAL DATA: Low grade fever, cough, and left-sided chest pain.

CHEST - 2 VIEW

[w chest pa]
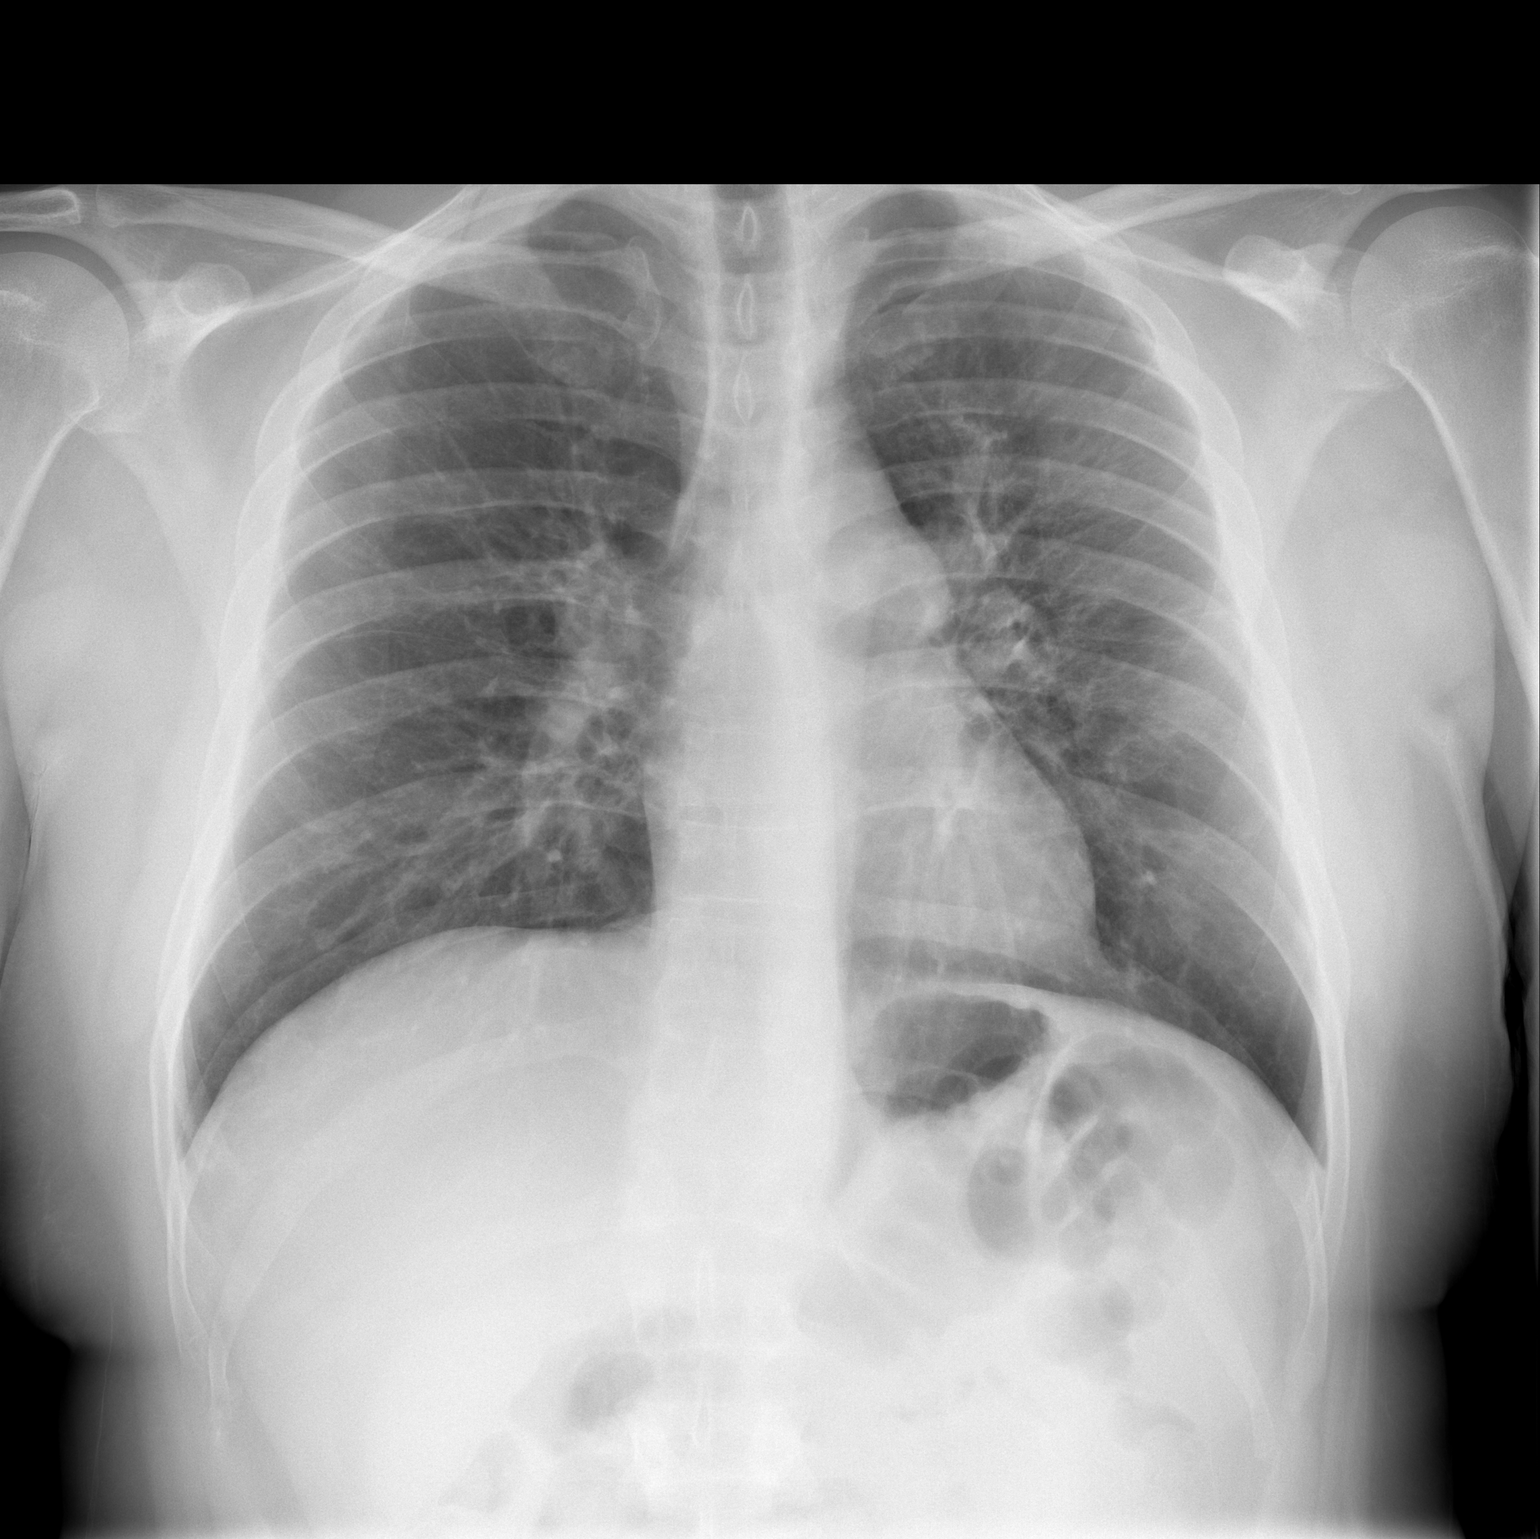

[w chest lat]
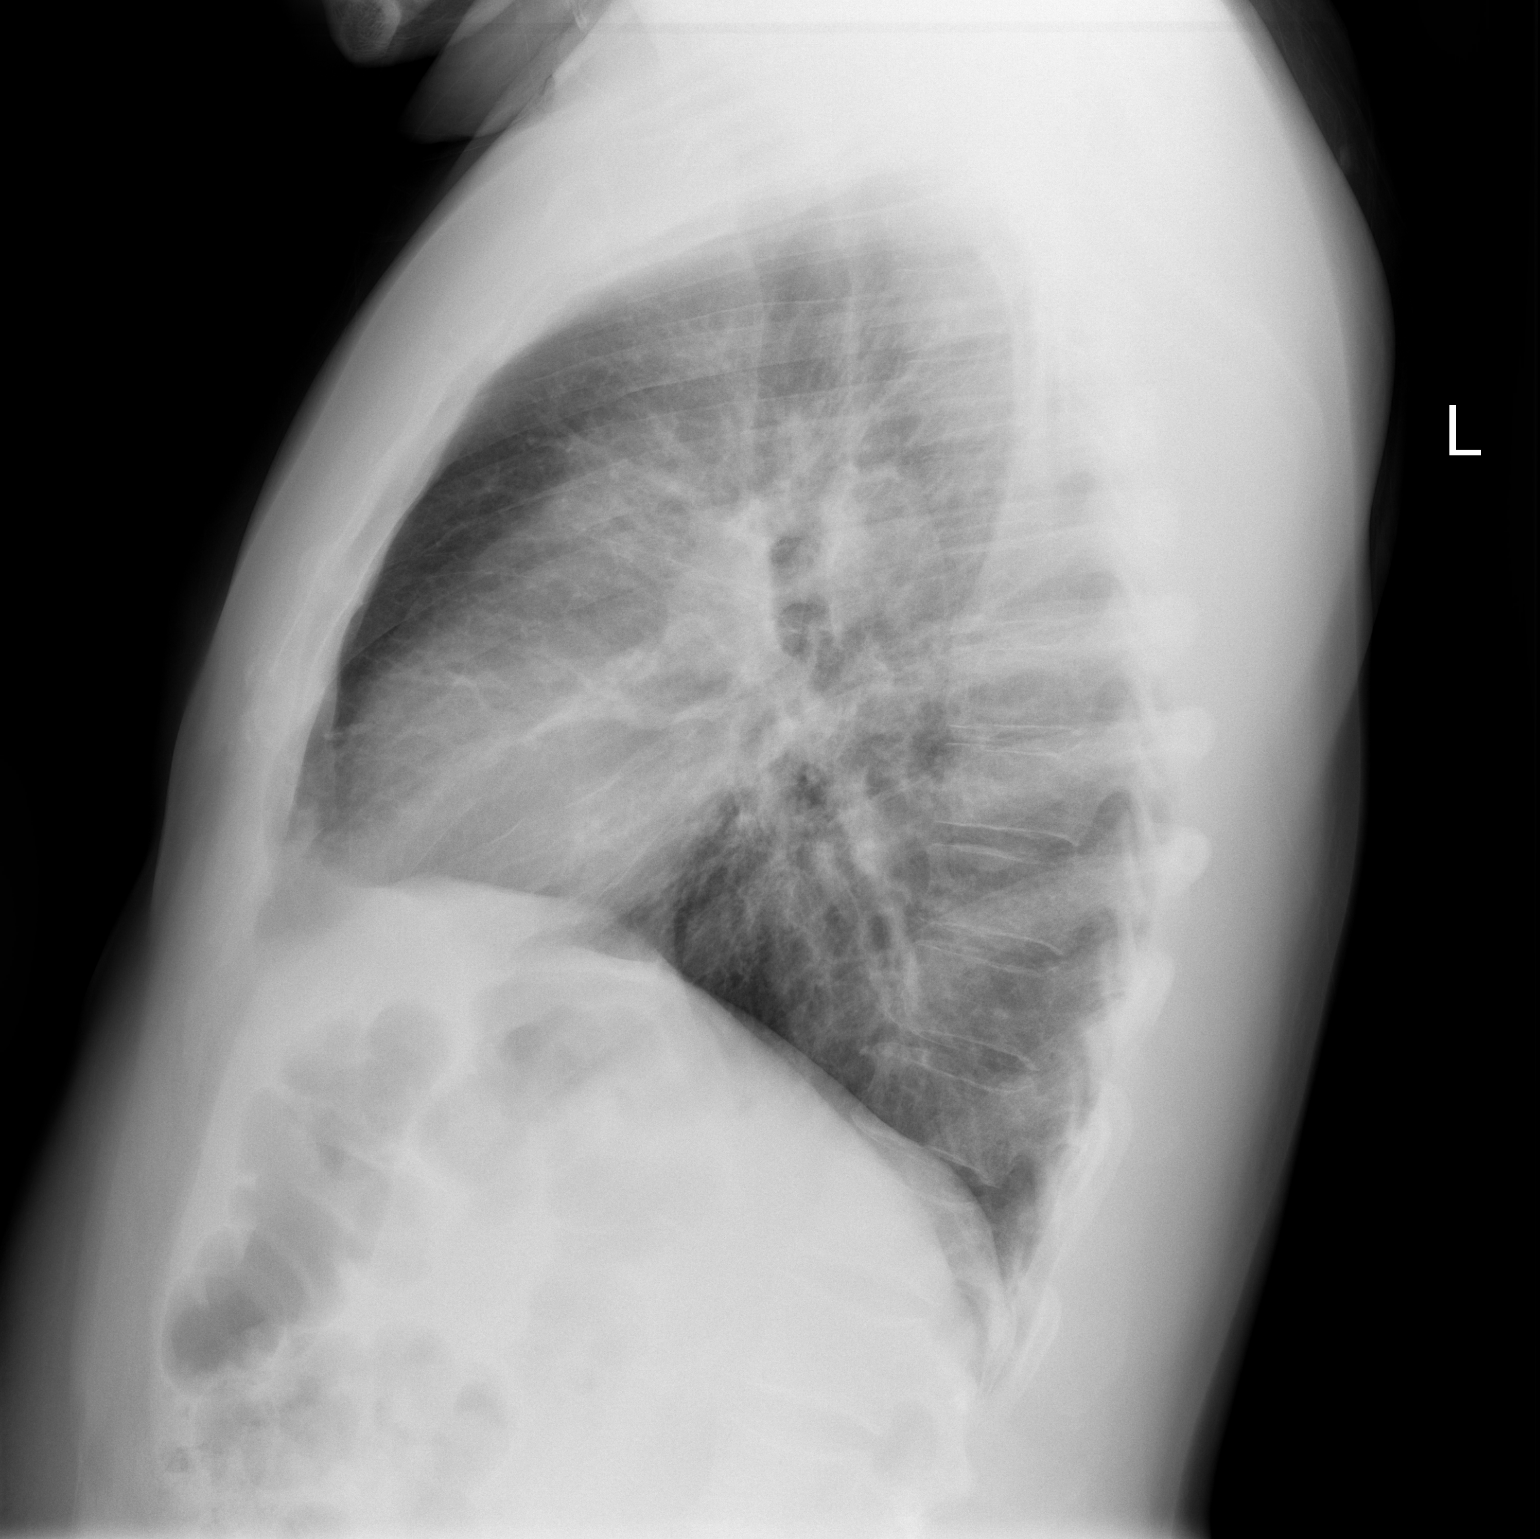

[2 of 2 positions shown; findings below may reference images not displayed]

FINDINGS: The heart size and pulmonary vascularity are normal. The
lungs appear clear and expanded without focal air space disease or
consolidation. No blunting of the costophrenic angles.  No
pneumothorax.
IMPRESSION: No evidence of active pulmonary disease.

## 2011-03-25 MED ORDER — ALBUTEROL SULFATE (5 MG/ML) 0.5% IN NEBU
INHALATION_SOLUTION | RESPIRATORY_TRACT | Status: AC
  Administered 2011-03-25: 5 mg via RESPIRATORY_TRACT
  Filled 2011-03-25: qty 1

## 2011-03-25 MED ORDER — ACETAMINOPHEN-CODEINE 120-12 MG/5ML PO SUSP: 5.0000 mL | Freq: Four times a day (QID) | ORAL | Status: AC | PRN

## 2011-03-25 MED ORDER — ACETAMINOPHEN 325 MG PO TABS
650.0000 mg | ORAL_TABLET | Freq: Once | ORAL | Status: AC
  Administered 2011-03-25: 650 mg via ORAL

## 2011-03-25 MED ORDER — IBUPROFEN 800 MG PO TABS
ORAL_TABLET | ORAL | Status: AC
  Administered 2011-03-25: 800 mg via ORAL
  Filled 2011-03-25: qty 1

## 2011-03-25 MED ORDER — ACETAMINOPHEN 325 MG PO TABS
ORAL_TABLET | ORAL | Status: AC
  Administered 2011-03-25: 650 mg via ORAL
  Filled 2011-03-25: qty 2

## 2011-03-25 MED ORDER — IBUPROFEN 800 MG PO TABS
800.0000 mg | ORAL_TABLET | Freq: Once | ORAL | Status: AC
  Administered 2011-03-25: 800 mg via ORAL

## 2011-03-25 MED ORDER — ONDANSETRON 8 MG PO TBDP
8.0000 mg | ORAL_TABLET | Freq: Once | ORAL | Status: AC
  Administered 2011-03-25: 8 mg via ORAL
  Filled 2011-03-25: qty 1

## 2011-03-25 MED ORDER — ALBUTEROL SULFATE (5 MG/ML) 0.5% IN NEBU
5.0000 mg | INHALATION_SOLUTION | Freq: Once | RESPIRATORY_TRACT | Status: AC
  Administered 2011-03-25: 5 mg via RESPIRATORY_TRACT

## 2011-03-25 MED ORDER — OSELTAMIVIR PHOSPHATE 75 MG PO CAPS: 75.0000 mg | ORAL_CAPSULE | Freq: Two times a day (BID) | ORAL | Status: AC

## 2011-03-25 MED ORDER — ONDANSETRON HCL 4 MG PO TABS: 4.0000 mg | ORAL_TABLET | Freq: Four times a day (QID) | ORAL | Status: AC

## 2011-03-25 NOTE — ED Provider Notes (Signed)
History     CSN: [id number]  Arrival date & time 03/25/11  6295   First MD Initiated Contact with Patient 03/25/11 [phone]      Chief Complaint  Patient presents with  . Influenza    (Consider location/radiation/quality/duration/timing/severity/associated sxs/prior treatment) Patient is a 40 y.o. male presenting with flu symptoms. The history is provided by the patient.  Influenza This is a new problem. The problem occurs constantly. The problem has not changed since onset.Associated symptoms include chest pain. Pertinent negatives include no abdominal pain, no headaches and no shortness of breath. The symptoms are aggravated by exertion. The symptoms are relieved by nothing. He has tried nothing for the symptoms. The treatment provided no relief.   history of sarcoid, did not get a flu shot this season. Said symptoms for the last 7-10 days, including cough, congestion, sore throat, voice change, now with pain in his chest every time he coughs. No pain outside of coughing. He has some lower back discomfort body aches. He has subjective fever and chills at home. He denies taking any medications for the symptoms. Uses inhaler as needed. He is scheduled followup with his primary doctor this week in clinic. No leg pain or swelling. Moderate in severity.  Past Medical History  Diagnosis Date  . Sarcoidosis     Past Surgical History  Procedure Date  . Arm surgery     History reviewed. No pertinent family history.  History  Substance Use Topics  . Smoking status: Former Games developer  . Smokeless tobacco: Not on file  . Alcohol Use: Yes      Review of Systems  Constitutional: Positive for fever and chills.  HENT: Positive for congestion, sore throat, rhinorrhea and voice change. Negative for neck pain and neck stiffness.   Eyes: Negative for pain.  Respiratory: Positive for cough. Negative for shortness of breath.   Cardiovascular: Positive for chest pain. Negative for leg swelling.    Gastrointestinal: Positive for nausea. Negative for vomiting and abdominal pain.  Genitourinary: Negative for dysuria and flank pain.  Musculoskeletal: Positive for back pain.  Skin: Negative for rash.  Neurological: Negative for headaches.  All other systems reviewed and are negative.    Allergies  Review of patient's allergies indicates no known allergies.  Home Medications   Current Outpatient Rx  Name Route Sig Dispense Refill  . ALBUTEROL SULFATE HFA 108 (90 BASE) MCG/ACT IN AERS Inhalation Inhale 2 puffs into the lungs every 6 (six) hours as needed. For shortness of breath and wheezing     . PREDNISONE 20 MG PO TABS Oral Take 30 mg by mouth daily.     Terald Sleeper HFA IN Inhalation Inhale into the lungs as needed.     Marland Kitchen HYDROCODONE-ACETAMINOPHEN 5-325 MG PO TABS Oral Take 1 tablet by mouth every 6 (six) hours as needed.      Marland Kitchen PANTOPRAZOLE SODIUM 20 MG PO TBEC Oral Take 1 tablet (20 mg total) by mouth daily. 30 tablet 0    BP 151/101  Pulse 126  Temp(Src) 98.5 F (36.9 C) (Oral)  Resp 20  Ht 5\' 9"  (1.753 m)  Wt 210 lb (95.255 kg)  BMI 31.01 kg/m2  SpO2 98%  Physical Exam  Constitutional: He is oriented to person, place, and time. He appears well-developed and well-nourished.  HENT:  Head: Normocephalic and atraumatic.       Oral pharynx is clear without posterior erythema or injury dates. Uvula midline. Moist mucous membranes. Nasal congestion. Hoarse voice.  Eyes:  Conjunctivae and EOM are normal. Pupils are equal, round, and reactive to light. Right eye exhibits no discharge. Left eye exhibits no discharge.  Neck: Trachea normal. Neck supple. No thyromegaly present.  Cardiovascular: Normal rate, regular rhythm, S1 normal, S2 normal and normal pulses.     No systolic murmur is present   No diastolic murmur is present  Pulses:      Radial pulses are 2+ on the right side, and 2+ on the left side.  Pulmonary/Chest: Effort normal and breath sounds normal. He has no  wheezes. He has no rhonchi. He has no rales. He exhibits tenderness.       Reproducible left chest wall tenderness. No crepitus or erythema.  Abdominal: Soft. Normal appearance and bowel sounds are normal. There is no tenderness. There is no CVA tenderness and negative Murphy's sign.  Musculoskeletal: Normal range of motion. He exhibits no edema.       BLE:s Calves nontender, no cords or erythema, negative Homans sign  Lymphadenopathy:    He has no cervical adenopathy.  Neurological: He is alert and oriented to person, place, and time. He has normal strength. No cranial nerve deficit or sensory deficit. GCS eye subscore is 4. GCS verbal subscore is 5. GCS motor subscore is 6.  Skin: Skin is warm and dry. No rash noted. He is not diaphoretic.  Psychiatric: His speech is normal.       Cooperative and appropriate    ED Course  Procedures (including critical care time)  Dg Chest 2 View  03/25/2011  *RADIOLOGY REPORT*  Clinical Data: Low grade fever, cough, and left-sided chest pain.  CHEST - 2 VIEW  Comparison: 08/26/2010  Findings: The heart size and pulmonary vascularity are normal. The lungs appear clear and expanded without focal air space disease or consolidation. No blunting of the costophrenic angles.  No pneumothorax.  IMPRESSION: No evidence of active pulmonary disease.  Original Report Authenticated By: Marlon Pel, M.D.    Room air pulse ox 98% is adequate  Zofran for nausea. Albuterol for respiratory complaints with history of sarcoid. Tylenol and Motrin for body aches symptoms. Chest x-ray obtained and reviewed as above. Tolerates by mouth fluids.  MDM   Clinical presentation suggest flulike illness. Given significant history of sarcoid, is on prednisone 30 mg daily for the same, has moderate complaints, will treat with Tamiflu and plan outpatient followup as scheduled in clinic in 3 days. Reliable historian and agrees to strict return precautions and all discharge and  followup instructions.        Sunnie Nielsen, MD 03/25/11 [phone]

## 2011-03-25 NOTE — ED Notes (Signed)
Pt states that he has had a low grade fever, cough, chest pain on the L side, and back pain in lower back in the middle.  Hx of sarcoidosis

## 2011-03-25 NOTE — ED Notes (Signed)
Pt is drinking water and not having any episodes of nausea at this time.

## 2011-10-25 ENCOUNTER — Emergency Department (HOSPITAL_BASED_OUTPATIENT_CLINIC_OR_DEPARTMENT_OTHER)
Admission: EM | Admit: 2011-10-25 | Discharge: 2011-10-25 | Disposition: A | Discharge status: Home / Self Care | Payer: Self-pay | Attending: Emergency Medicine | Admitting: Emergency Medicine

## 2011-10-25 ENCOUNTER — Encounter (HOSPITAL_BASED_OUTPATIENT_CLINIC_OR_DEPARTMENT_OTHER): Payer: Self-pay | Admitting: *Deleted

## 2011-10-25 DIAGNOSIS — R059 Cough, unspecified: Secondary | ICD-10-CM | POA: Insufficient documentation

## 2011-10-25 DIAGNOSIS — J209 Acute bronchitis, unspecified: Secondary | ICD-10-CM

## 2011-10-25 DIAGNOSIS — Z720 Tobacco use: Secondary | ICD-10-CM

## 2011-10-25 DIAGNOSIS — D869 Sarcoidosis, unspecified: Secondary | ICD-10-CM | POA: Insufficient documentation

## 2011-10-25 DIAGNOSIS — K219 Gastro-esophageal reflux disease without esophagitis: Secondary | ICD-10-CM | POA: Insufficient documentation

## 2011-10-25 DIAGNOSIS — R05 Cough: Secondary | ICD-10-CM | POA: Insufficient documentation

## 2011-10-25 DIAGNOSIS — F17200 Nicotine dependence, unspecified, uncomplicated: Secondary | ICD-10-CM | POA: Insufficient documentation

## 2011-10-25 HISTORY — DX: Gastro-esophageal reflux disease without esophagitis: K21.9

## 2011-10-25 HISTORY — DX: Bronchitis, not specified as acute or chronic: J40

## 2011-10-25 HISTORY — DX: Reserved for inherently not codable concepts without codable children: IMO0001

## 2011-10-25 MED ORDER — AZITHROMYCIN 250 MG PO TABS: 250.0000 mg | ORAL_TABLET | Freq: Every day | ORAL | Status: AC

## 2011-10-25 MED ORDER — ALBUTEROL SULFATE HFA 108 (90 BASE) MCG/ACT IN AERS
2.0000 | INHALATION_SPRAY | Freq: Once | RESPIRATORY_TRACT | Status: AC
  Administered 2011-10-25: 2 via RESPIRATORY_TRACT
  Filled 2011-10-25: qty 6.7

## 2011-10-25 NOTE — ED Provider Notes (Signed)
History     CSN: [id number]  Arrival date & time 10/25/11  0909   First MD Initiated Contact with Patient 10/25/11 [phone]      Chief Complaint  Patient presents with  . Cough    (Consider location/radiation/quality/duration/timing/severity/associated sxs/prior treatment) Patient is a 41 y.o. male presenting with cough. The history is provided by the patient.  Cough This is a new problem. Episode onset: 4 days ago. The problem occurs constantly. The problem has been gradually worsening. The cough is productive of blood-tinged sputum. There has been no fever. Associated symptoms include rhinorrhea. Pertinent negatives include no chest pain. He has tried decongestants for the symptoms. The treatment provided mild relief. He is a smoker. Past medical history comments: sarcoidosis.    Past Medical History  Diagnosis Date  . Sarcoidosis   . Bronchitis   . Reflux     Past Surgical History  Procedure Date  . Arm surgery   . Lung biopsy     No family history on file.  History  Substance Use Topics  . Smoking status: Current Everyday Smoker -- 1.0 packs/day    Types: Cigarettes  . Smokeless tobacco: Not on file  . Alcohol Use: Yes      Review of Systems  HENT: Positive for rhinorrhea.   Respiratory: Positive for cough.   Cardiovascular: Negative for chest pain.  All other systems reviewed and are negative.    Allergies  Review of patient's allergies indicates no known allergies.  Home Medications   Current Outpatient Rx  Name Route Sig Dispense Refill  . ALBUTEROL SULFATE HFA 108 (90 BASE) MCG/ACT IN AERS Inhalation Inhale 2 puffs into the lungs every 6 (six) hours as needed. For shortness of breath and wheezing     . VENTOLIN HFA IN Inhalation Inhale into the lungs as needed.     Marland Kitchen HYDROCODONE-ACETAMINOPHEN 5-325 MG PO TABS Oral Take 1 tablet by mouth every 6 (six) hours as needed.      Marland Kitchen PANTOPRAZOLE SODIUM 20 MG PO TBEC Oral Take 1 tablet (20 mg total) by mouth  daily. 30 tablet 0  . PREDNISONE 20 MG PO TABS Oral Take 20 mg by mouth 2 (two) times daily.        BP 149/97  Pulse 93  Temp 98.3 F (36.8 C) (Oral)  Resp 20  Ht 5\' 9"  (1.753 m)  Wt 215 lb (97.523 kg)  BMI 31.75 kg/m2  SpO2 97%  Physical Exam  Nursing note and vitals reviewed. Constitutional: He is oriented to person, place, and time. He appears well-developed and well-nourished. No distress.  HENT:  Head: Normocephalic and atraumatic.  Mouth/Throat: Oropharynx is clear and moist.       Bilateral TM's clear.  Neck: Normal range of motion. Neck supple.  Cardiovascular: Normal rate and regular rhythm.   No murmur heard. Pulmonary/Chest: Effort normal and breath sounds normal. No respiratory distress. He has no wheezes.  Abdominal: Soft. Bowel sounds are normal. He exhibits no distension. There is no tenderness.  Musculoskeletal: Normal range of motion. He exhibits no edema.  Lymphadenopathy:    He has no cervical adenopathy.  Neurological: He is alert and oriented to person, place, and time.  Skin: Skin is warm and dry. He is not diaphoretic.    ED Course  Procedures (including critical care time)  Labs Reviewed - No data to display No results found.   No diagnosis found.    MDM  Patient presents with symptoms consistent with bronchitis/sinusitis.  He is a smoker and was diagnosed 8 years ago with sarcoidosis.  I have advised him that smoking cessation is in his best interest.  He tells me he is well aware of the risks of tobacco use.        Geoffery Lyons, MD 10/25/11 [phone]

## 2011-10-25 NOTE — ED Notes (Signed)
Patient states for the last 3 days he has had sinus congestion, runny nose ,  Productive cough with yellow secretions, chest tightness and bodyaches.

## 2012-02-07 ENCOUNTER — Emergency Department (HOSPITAL_BASED_OUTPATIENT_CLINIC_OR_DEPARTMENT_OTHER): Payer: Self-pay

## 2012-02-07 ENCOUNTER — Emergency Department (HOSPITAL_BASED_OUTPATIENT_CLINIC_OR_DEPARTMENT_OTHER)
Admission: EM | Admit: 2012-02-07 | Discharge: 2012-02-07 | Disposition: A | Discharge status: Home / Self Care | Payer: Self-pay | Attending: Emergency Medicine | Admitting: Emergency Medicine

## 2012-02-07 ENCOUNTER — Encounter (HOSPITAL_BASED_OUTPATIENT_CLINIC_OR_DEPARTMENT_OTHER): Payer: Self-pay

## 2012-02-07 DIAGNOSIS — D869 Sarcoidosis, unspecified: Secondary | ICD-10-CM | POA: Insufficient documentation

## 2012-02-07 DIAGNOSIS — K219 Gastro-esophageal reflux disease without esophagitis: Secondary | ICD-10-CM | POA: Insufficient documentation

## 2012-02-07 DIAGNOSIS — R062 Wheezing: Secondary | ICD-10-CM | POA: Insufficient documentation

## 2012-02-07 DIAGNOSIS — F17200 Nicotine dependence, unspecified, uncomplicated: Secondary | ICD-10-CM | POA: Insufficient documentation

## 2012-02-07 DIAGNOSIS — R0602 Shortness of breath: Secondary | ICD-10-CM | POA: Insufficient documentation

## 2012-02-07 DIAGNOSIS — J40 Bronchitis, not specified as acute or chronic: Secondary | ICD-10-CM | POA: Insufficient documentation

## 2012-02-07 DIAGNOSIS — R0682 Tachypnea, not elsewhere classified: Secondary | ICD-10-CM | POA: Insufficient documentation

## 2012-02-07 DIAGNOSIS — R079 Chest pain, unspecified: Secondary | ICD-10-CM | POA: Insufficient documentation

## 2012-02-07 DIAGNOSIS — R6883 Chills (without fever): Secondary | ICD-10-CM | POA: Insufficient documentation

## 2012-02-07 IMAGING — CR DG CHEST 2V
2 series · 2 of 2 positions shown · non-contrast
Comparison: [DATE]

CLINICAL DATA: Productive cough, congestion.

CHEST - 2 VIEW

[w chest pa]
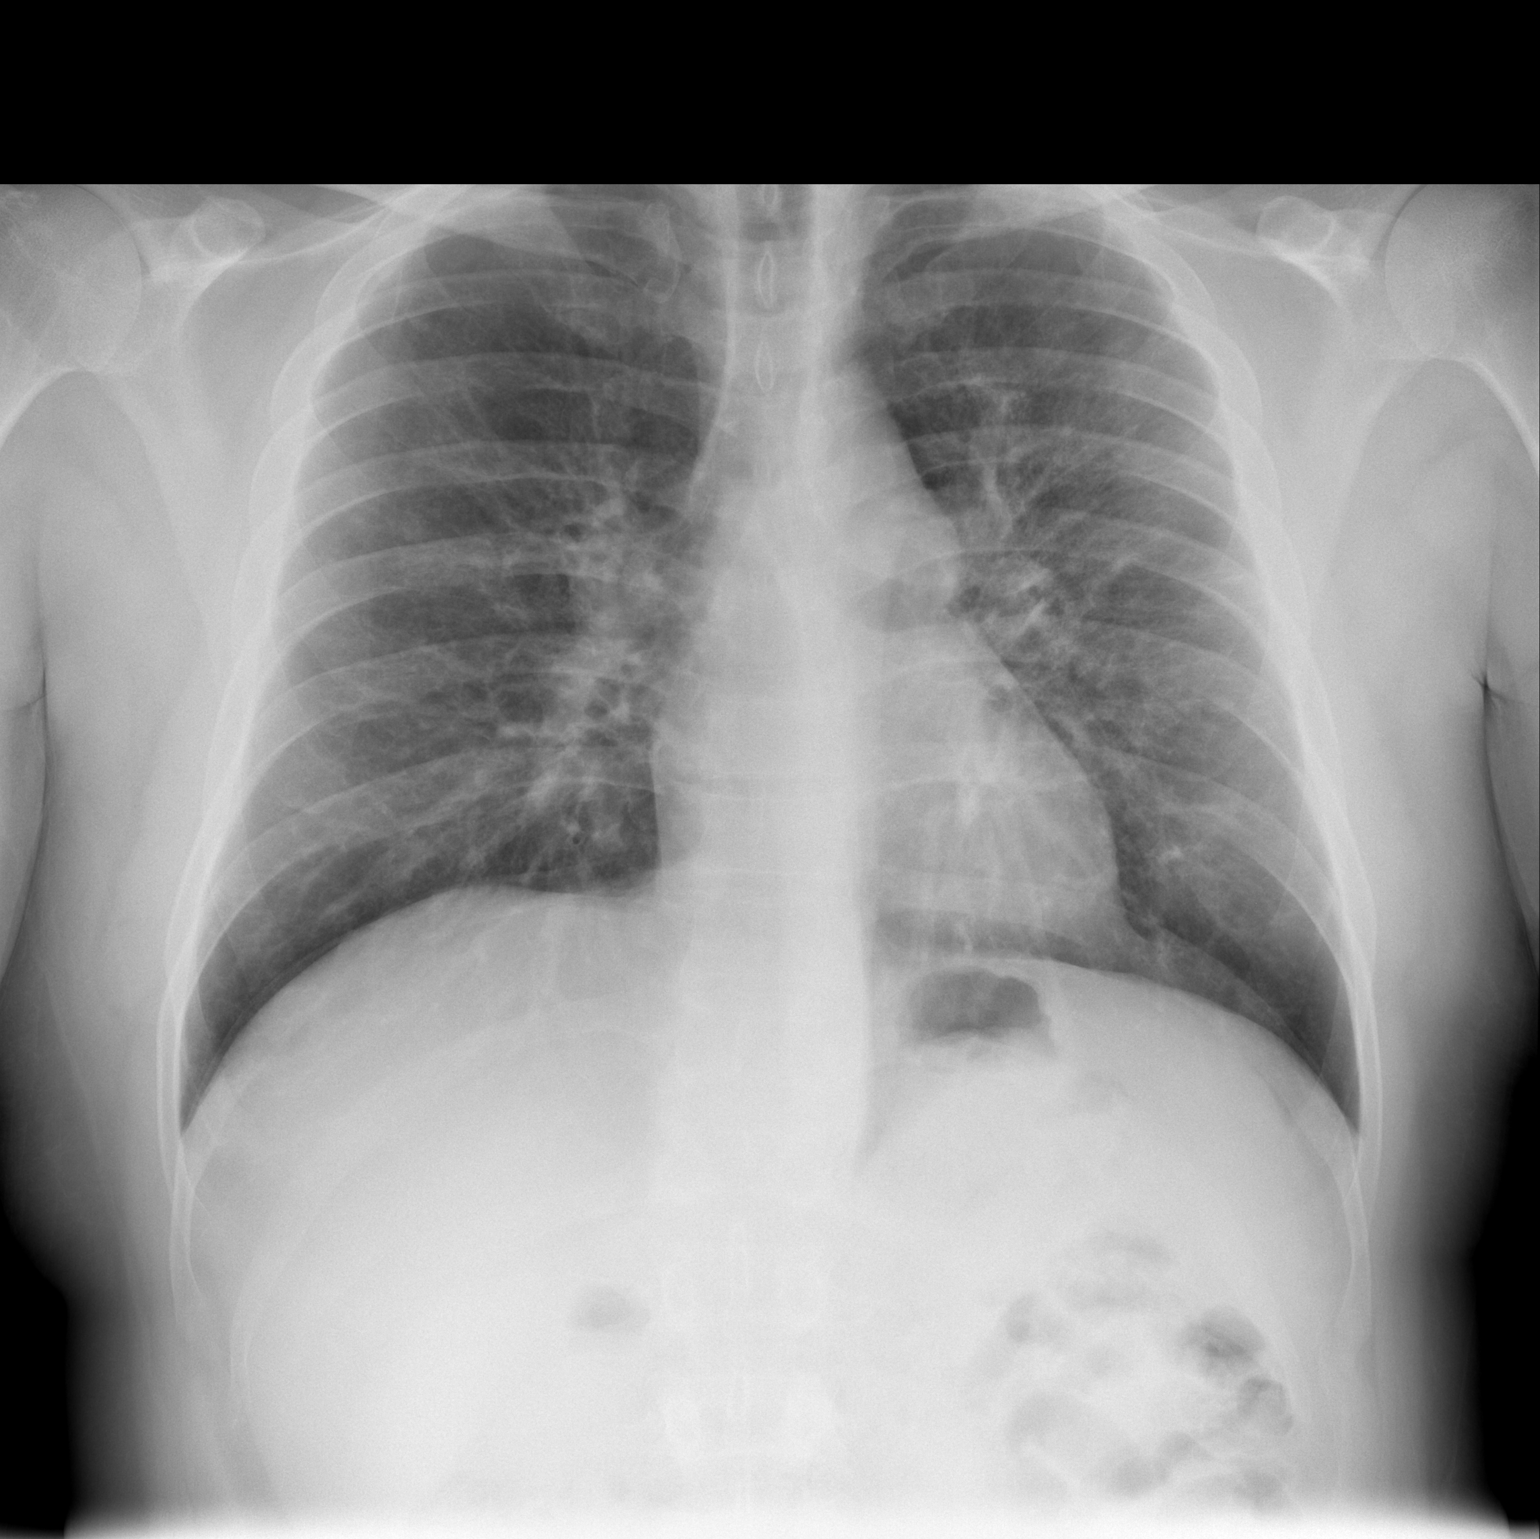

[w chest lat]
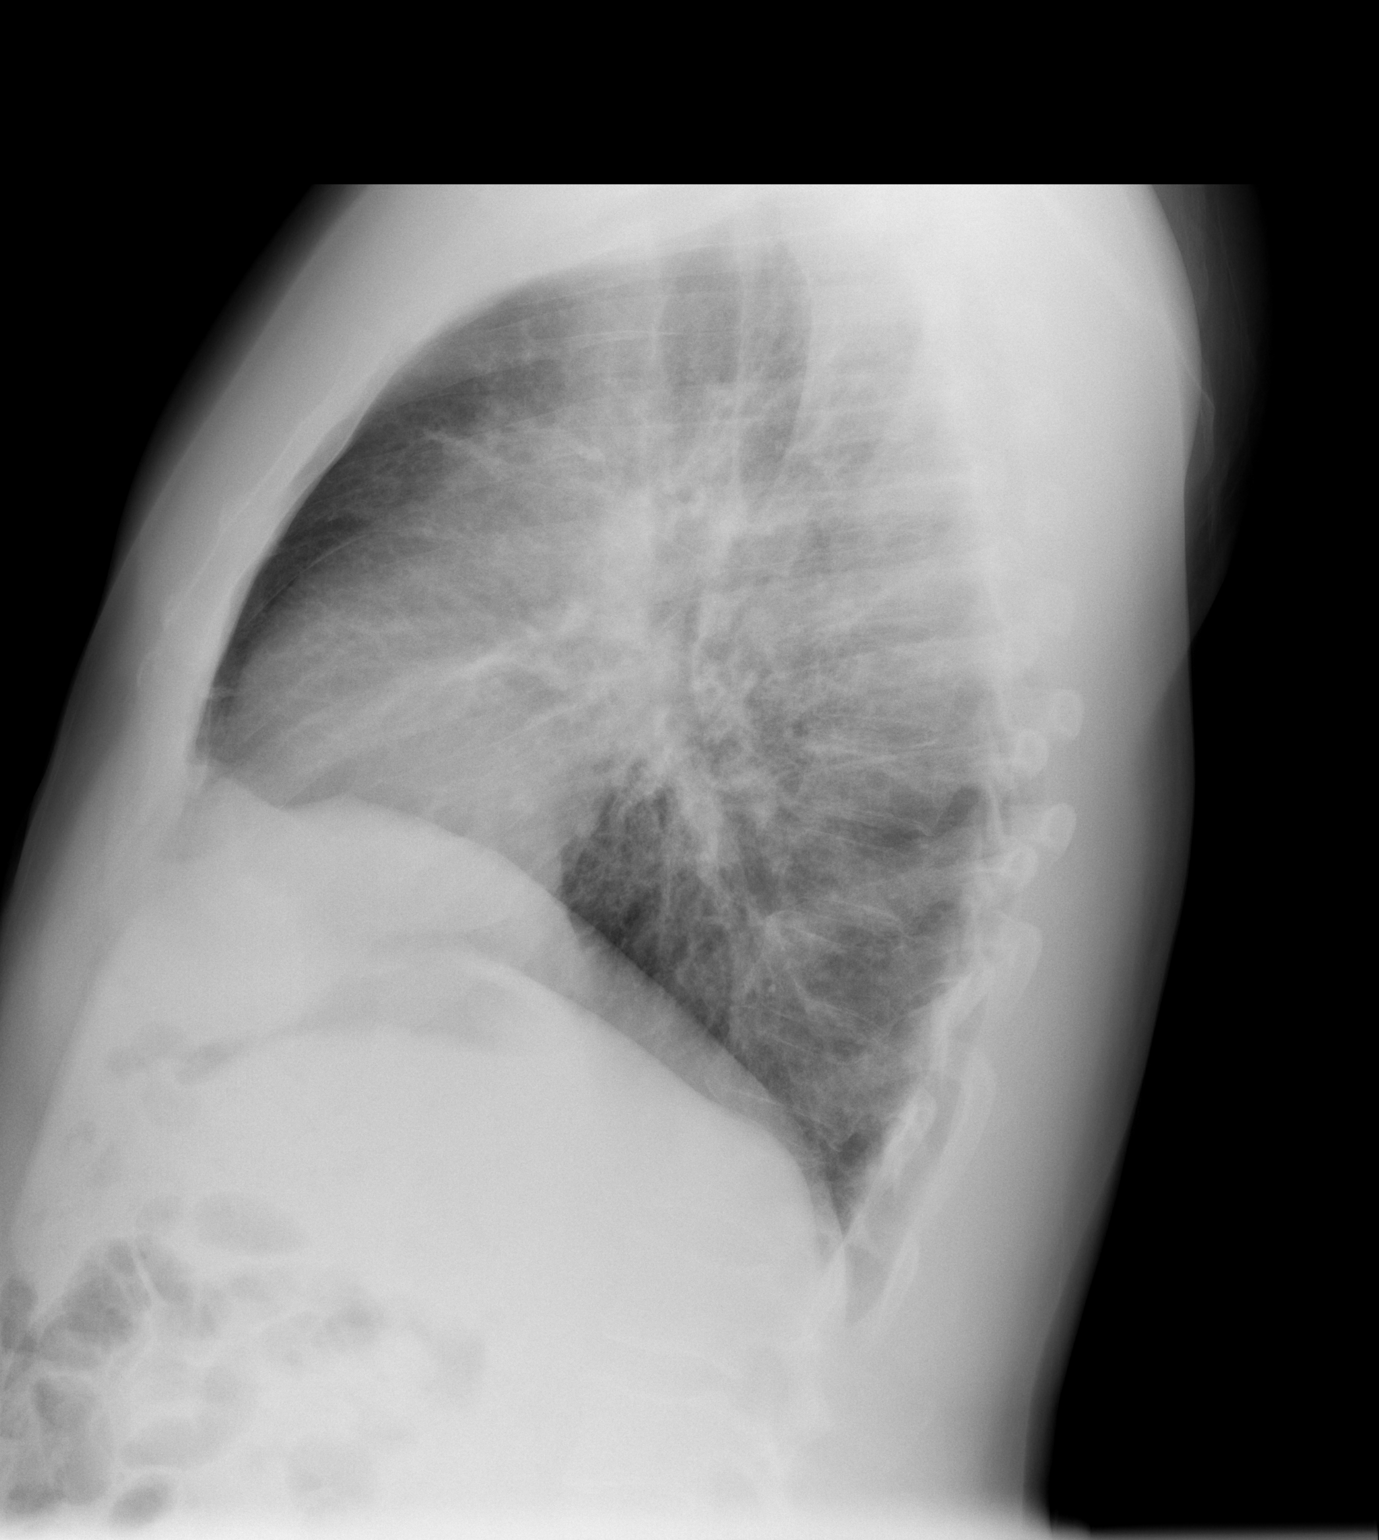

[2 of 2 positions shown; findings below may reference images not displayed]

FINDINGS: The diffuse peribronchial thickening without confluent
airspace opacity or effusion.  Heart is normal size.  No bony
abnormality.
IMPRESSION: Bronchitic changes.

## 2012-02-07 MED ORDER — OXYCODONE-ACETAMINOPHEN 5-325 MG PO TABS: 2.0000 | ORAL_TABLET | ORAL | Status: DC | PRN

## 2012-02-07 MED ORDER — ALBUTEROL SULFATE HFA 108 (90 BASE) MCG/ACT IN AERS
2.0000 | INHALATION_SPRAY | RESPIRATORY_TRACT | Status: DC | PRN
  Administered 2012-02-07: 2 via RESPIRATORY_TRACT
  Filled 2012-02-07: qty 6.7

## 2012-02-07 MED ORDER — ALBUTEROL SULFATE (5 MG/ML) 0.5% IN NEBU
5.0000 mg | INHALATION_SOLUTION | Freq: Once | RESPIRATORY_TRACT | Status: AC
  Administered 2012-02-07: 5 mg via RESPIRATORY_TRACT
  Filled 2012-02-07: qty 1

## 2012-02-07 MED ORDER — PREDNISONE 50 MG PO TABS
60.0000 mg | ORAL_TABLET | Freq: Once | ORAL | Status: AC
  Administered 2012-02-07: 60 mg via ORAL
  Filled 2012-02-07: qty 1

## 2012-02-07 MED ORDER — PREDNISONE 20 MG PO TABS: 40.0000 mg | ORAL_TABLET | Freq: Every day | ORAL | Status: DC

## 2012-02-07 MED ORDER — IPRATROPIUM BROMIDE 0.02 % IN SOLN
0.5000 mg | Freq: Once | RESPIRATORY_TRACT | Status: AC
  Administered 2012-02-07: 0.5 mg via RESPIRATORY_TRACT
  Filled 2012-02-07: qty 2.5

## 2012-02-07 NOTE — ED Notes (Signed)
C/o CP, coughing x 1 week

## 2012-02-07 NOTE — ED Notes (Signed)
Patient transported to X-ray 

## 2012-02-07 NOTE — ED Provider Notes (Addendum)
History     CSN: [id number]  Arrival date & time 02/07/12  1557   First MD Initiated Contact with Patient 02/07/12 1614      Chief Complaint  Patient presents with  . Cough    (Consider location/radiation/quality/duration/timing/severity/associated sxs/prior treatment) Patient is a 41 y.o. male presenting with cough. The history is provided by the patient.  Cough This is a recurrent problem. Episode onset: 2 weeks ago. The problem occurs constantly. The problem has been gradually worsening. The cough is productive of sputum. There has been no fever. Associated symptoms include chest pain, chills, shortness of breath and wheezing. He has tried decongestants for the symptoms. The treatment provided no relief. He is a smoker. Past medical history comments: Sarcoidosis.    Past Medical History  Diagnosis Date  . Sarcoidosis   . Bronchitis   . Reflux     Past Surgical History  Procedure Date  . Arm surgery   . Lung biopsy     No family history on file.  History  Substance Use Topics  . Smoking status: Current Every Day Smoker -- 1.0 packs/day    Types: Cigarettes  . Smokeless tobacco: Not on file  . Alcohol Use: Yes      Review of Systems  Constitutional: Positive for chills.  Respiratory: Positive for cough, shortness of breath and wheezing.   Cardiovascular: Positive for chest pain.  All other systems reviewed and are negative.    Allergies  Review of patient's allergies indicates no known allergies.  Home Medications   Current Outpatient Rx  Name  Route  Sig  Dispense  Refill  . PRILOSEC PO   Oral   Take by mouth.         . ALBUTEROL SULFATE HFA 108 (90 BASE) MCG/ACT IN AERS   Inhalation   Inhale 2 puffs into the lungs every 6 (six) hours as needed. For shortness of breath and wheezing          . VENTOLIN HFA IN   Inhalation   Inhale into the lungs as needed.          Marland Kitchen HYDROCODONE-ACETAMINOPHEN 5-325 MG PO TABS   Oral   Take 1 tablet by  mouth every 6 (six) hours as needed.           Marland Kitchen PANTOPRAZOLE SODIUM 20 MG PO TBEC   Oral   Take 1 tablet (20 mg total) by mouth daily.   30 tablet   0   . PREDNISONE 20 MG PO TABS   Oral   Take 20 mg by mouth 2 (two) times daily.             BP 128/85  Pulse 88  Temp 98.3 F (36.8 C) (Oral)  Resp 16  Ht 5\' 9"  (1.753 m)  Wt 220 lb (99.791 kg)  BMI 32.49 kg/m2  SpO2 98%  Physical Exam  Constitutional: He is oriented to person, place, and time. He appears well-developed and well-nourished. No distress.  HENT:  Head: Normocephalic and atraumatic.  Right Ear: External ear normal.  Left Ear: External ear normal.  Mouth/Throat: Oropharynx is clear and moist.  Eyes: Conjunctivae normal and EOM are normal. Pupils are equal, round, and reactive to light. Right eye exhibits no discharge.  Neck: Normal range of motion. Neck supple.  Cardiovascular: Normal rate, regular rhythm, normal heart sounds and intact distal pulses.   No murmur heard. Pulmonary/Chest: Tachypnea noted. No respiratory distress. He has no decreased breath sounds. He has  wheezes. He has no rhonchi. He has no rales.       Scant wheezing  Abdominal: Soft. There is no tenderness.  Musculoskeletal: Normal range of motion. He exhibits no edema and no tenderness.  Neurological: He is alert and oriented to person, place, and time.  Skin: Skin is warm and dry. No rash noted.  Psychiatric: He has a normal mood and affect.    ED Course  Procedures (including critical care time)  Labs Reviewed - No data to display Dg Chest 2 View  02/07/2012  *RADIOLOGY REPORT*  Clinical Data: Productive cough, congestion.  CHEST - 2 VIEW  Comparison: 03/25/2011  Findings: The diffuse peribronchial thickening without confluent airspace opacity or effusion.  Heart is normal size.  No bony abnormality.  IMPRESSION: Bronchitic changes.   Original Report Authenticated By: Charlett Nose, M.D.      1. Sarcoidosis       MDM    Patient with a history of sarcoidosis who for the last 2 weeks has had worsening productive cough, wheezing and shortness of breath. He's been coughing so hard has caused his chest hurt. He ran out of his inhaler approximately one week ago. He is also smoking. Satting 98% on room air in no acute distress but does have some scant wheezing. Chest x-ray pending. Patient given albuterol, Atrovent and prednisone  5:03 PM CXR wnl.  Will d/c home.      Gwyneth Sprout, MD 02/07/12 1704  Gwyneth Sprout, MD 02/07/12 1705

## 2012-03-01 ENCOUNTER — Emergency Department (HOSPITAL_BASED_OUTPATIENT_CLINIC_OR_DEPARTMENT_OTHER): Payer: Self-pay

## 2012-03-01 ENCOUNTER — Encounter (HOSPITAL_BASED_OUTPATIENT_CLINIC_OR_DEPARTMENT_OTHER): Payer: Self-pay | Admitting: Emergency Medicine

## 2012-03-01 ENCOUNTER — Emergency Department (HOSPITAL_BASED_OUTPATIENT_CLINIC_OR_DEPARTMENT_OTHER)
Admission: EM | Admit: 2012-03-01 | Discharge: 2012-03-01 | Disposition: A | Discharge status: Home / Self Care | Payer: Self-pay | Attending: Emergency Medicine | Admitting: Emergency Medicine

## 2012-03-01 DIAGNOSIS — N508 Other specified disorders of male genital organs: Secondary | ICD-10-CM | POA: Insufficient documentation

## 2012-03-01 DIAGNOSIS — K219 Gastro-esophageal reflux disease without esophagitis: Secondary | ICD-10-CM | POA: Insufficient documentation

## 2012-03-01 DIAGNOSIS — Z8709 Personal history of other diseases of the respiratory system: Secondary | ICD-10-CM | POA: Insufficient documentation

## 2012-03-01 DIAGNOSIS — F17200 Nicotine dependence, unspecified, uncomplicated: Secondary | ICD-10-CM | POA: Insufficient documentation

## 2012-03-01 DIAGNOSIS — Z79899 Other long term (current) drug therapy: Secondary | ICD-10-CM | POA: Insufficient documentation

## 2012-03-01 DIAGNOSIS — D869 Sarcoidosis, unspecified: Secondary | ICD-10-CM | POA: Insufficient documentation

## 2012-03-01 DIAGNOSIS — N503 Cyst of epididymis: Secondary | ICD-10-CM

## 2012-03-01 HISTORY — DX: Neoplasm of unspecified behavior of other genitourinary organ: D49.59

## 2012-03-01 IMAGING — US US ART/VEN ABD/PELV/SCROTUM DOPPLER LTD
1 series · 13 of 25 positions shown · non-contrast
Comparison: No similar prior study is available for comparison.

CLINICAL DATA: Left testicular mass discovered [TJ] per patient,
no treatment

SCROTAL ULTRASOUND
DOPPLER ULTRASOUND OF THE TESTICLES
TECHNIQUE: Complete ultrasound examination of the testicles,
epididymis, and other scrotal structures was performed.  Color and
spectral Doppler ultrasound were also utilized to evaluate blood
flow to the testicles.

[Series 1: us art/ven abd/pelv/scrotum doppler ltd · 0.08mm/px · 13 of 48 slices shown]
[im 1/48]
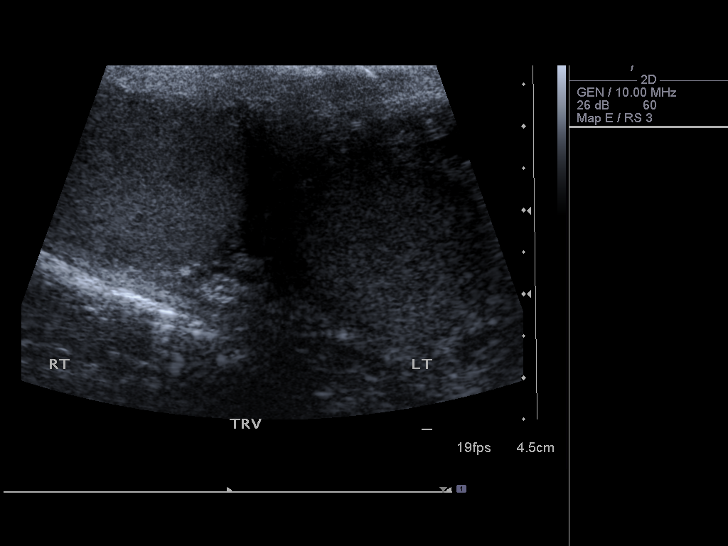
[im 4/48]
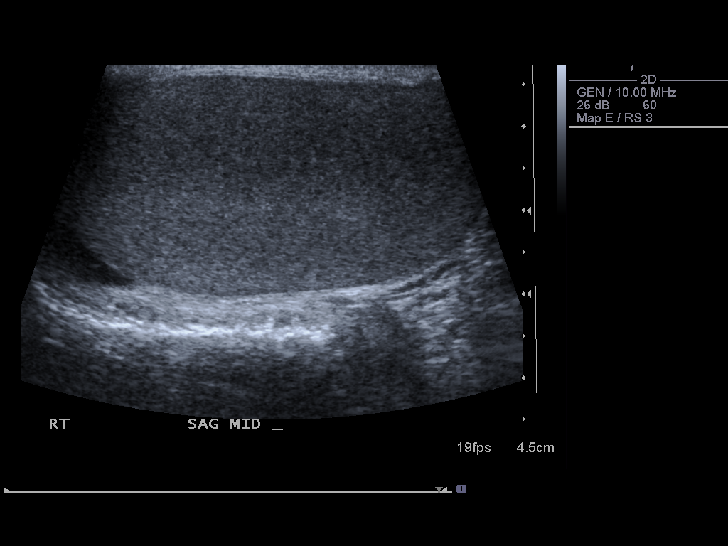
[im 8/48]
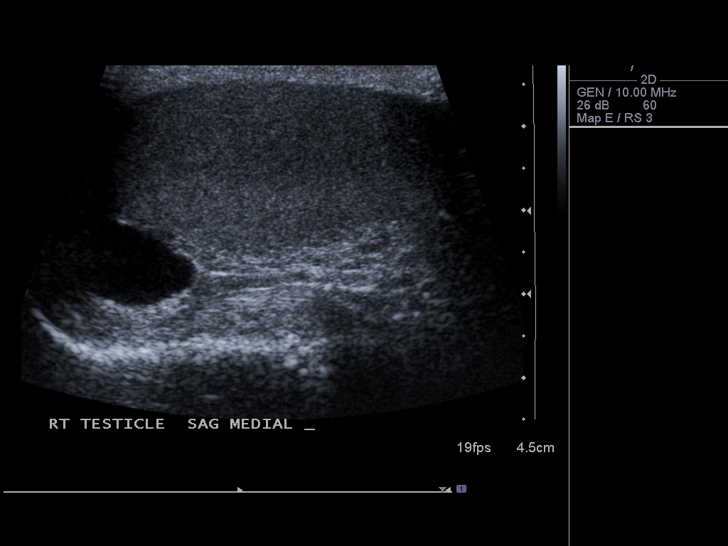
[im 12/48]
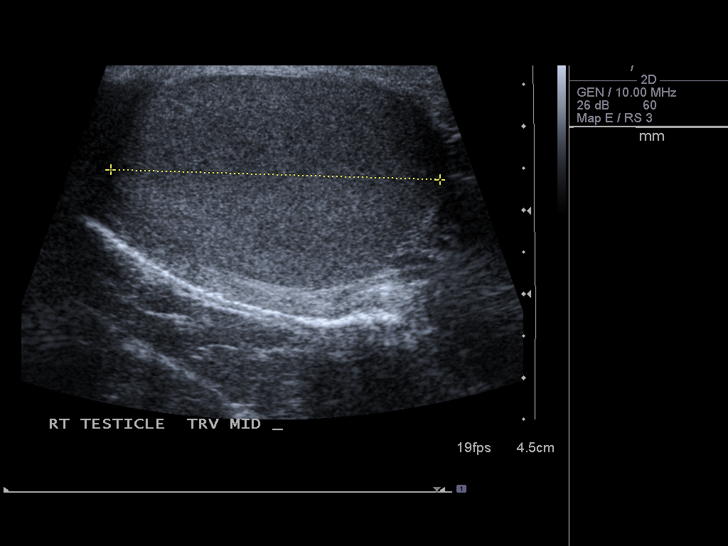
[im 16/48]
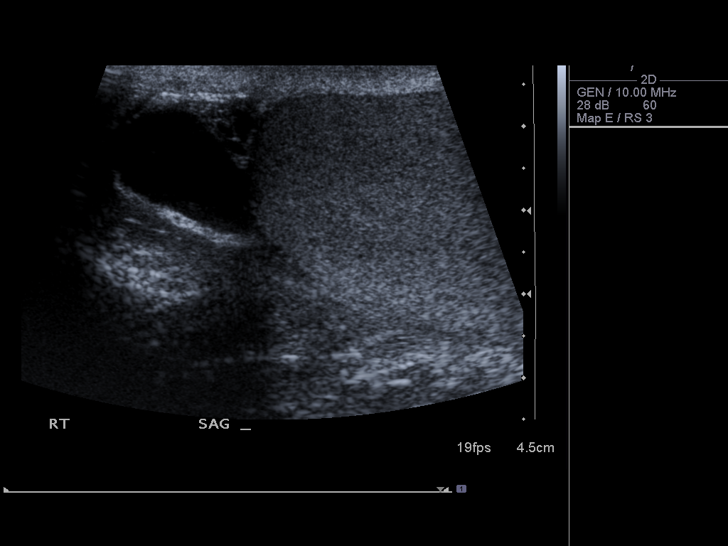
[im 20/48]
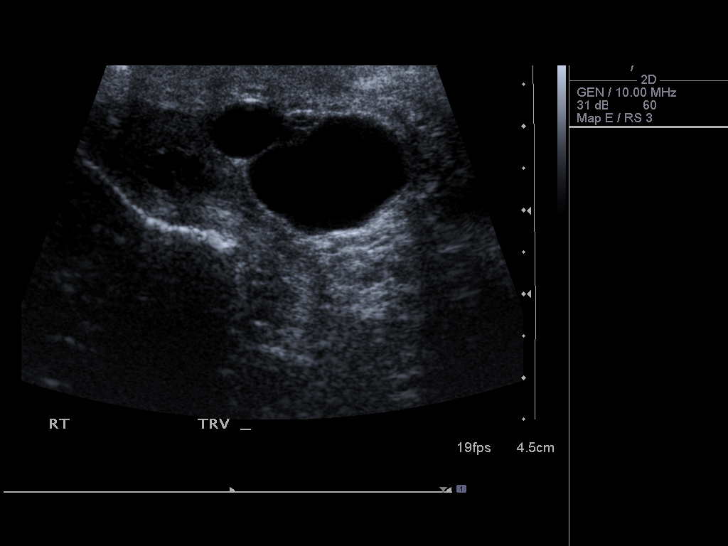
[im 24/48]
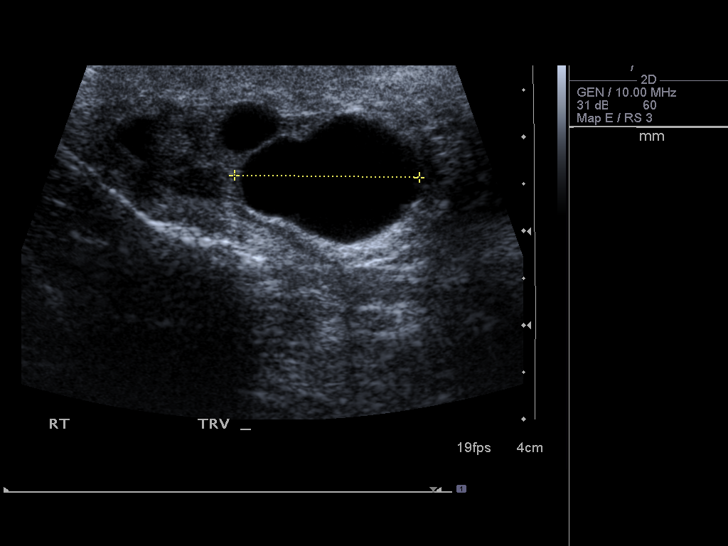
[im 28/48]
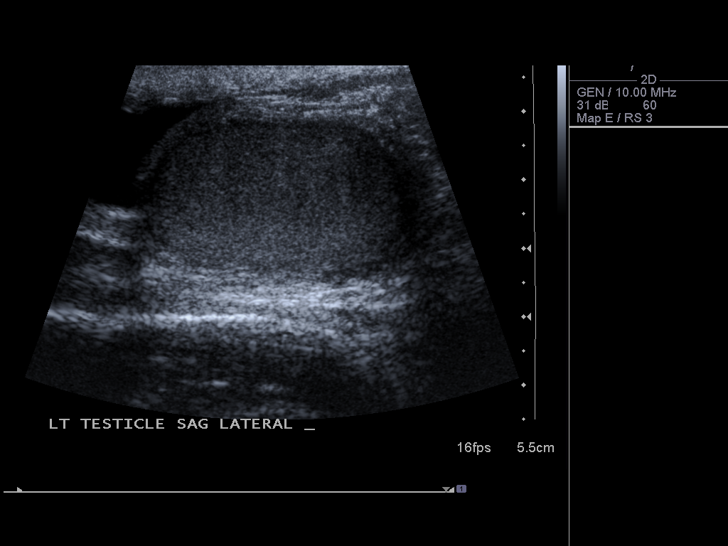
[im 32/48]
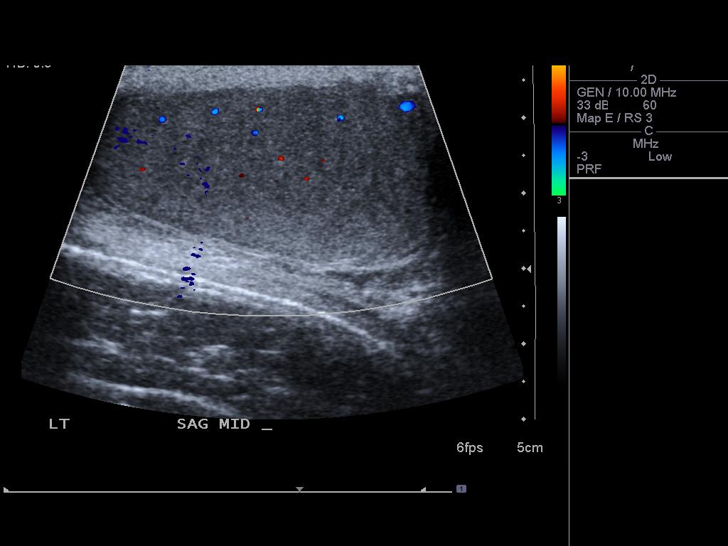
[im 36/48]
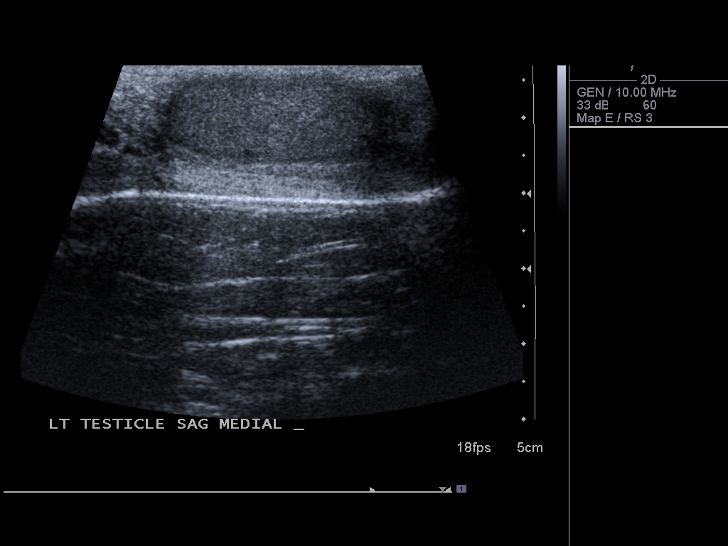
[im 40/48]
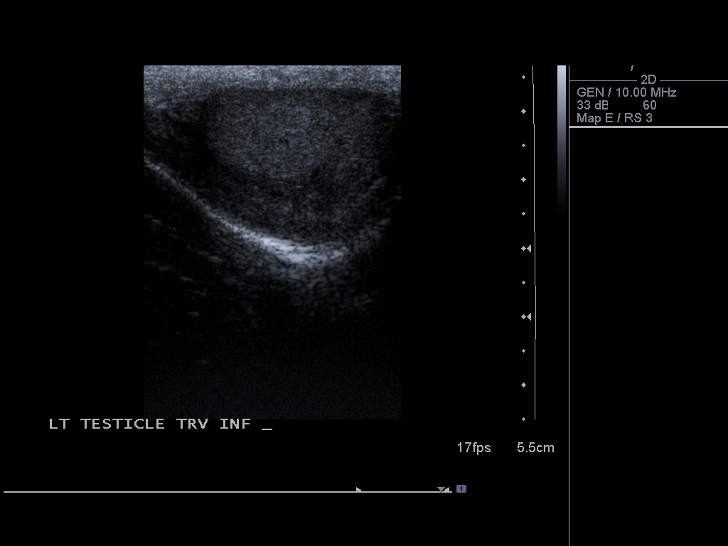
[im 44/48]
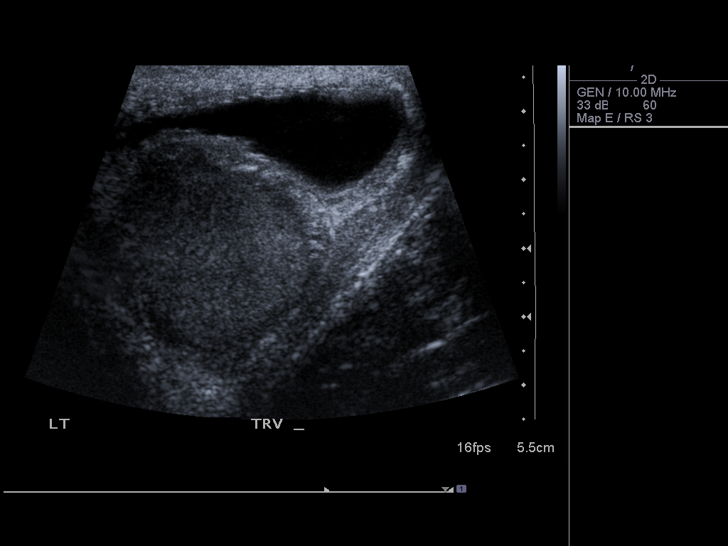
[im 48/48]
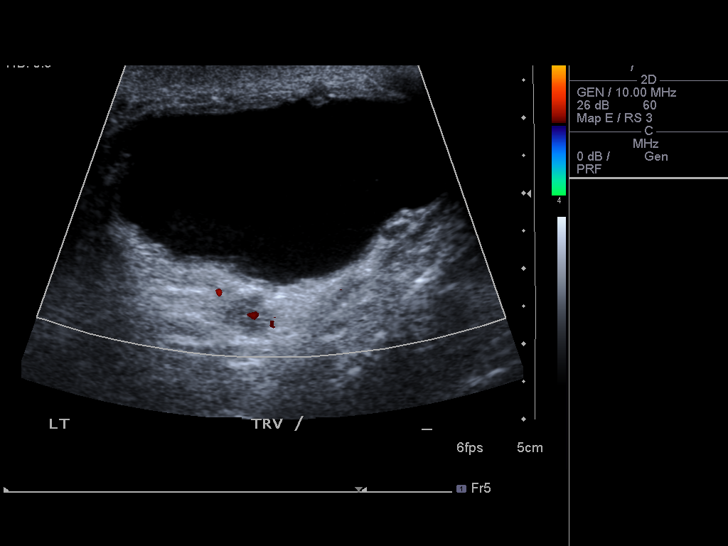

[13 of 25 positions shown; findings below may reference images not displayed]

FINDINGS: Right testis:  5.2 x 3.9 x 2.7 cm

Left testis:  4.6 x 4.3 x 2.4 cm

Right epididymis:  Simple appearing epididymal cysts incidentally
noted, 2.0 x 1.6 x 1.4 cm.  Otherwise normal.

Left epididymis:  There is a cystic structure in the region of the
expected location of the head of the epididymis measuring at least
4.4 cm maximally, incompletely visualized.  The epididymis itself
is not visualized.

Hydrocele:  Absent

Varicocele:  Absent

Pulsed Doppler interrogation of both testes demonstrates low
resistance arterial and venous wave forms bilaterally.
IMPRESSION: Bilateral (right, probable left) epididymal cysts.  No
intratesticular mass.

No sonographic evidence for testicular torsion.
Critical Value/emergent results were called by telephone at the
time of interpretation on [DATE] at [DATE] p.m. to Dr. JEREMIAS DA PAUSA, who
verbally acknowledged these results.

## 2012-03-01 MED ORDER — CEFTRIAXONE SODIUM 250 MG IJ SOLR
250.0000 mg | Freq: Once | INTRAMUSCULAR | Status: AC
  Administered 2012-03-01: 250 mg via INTRAMUSCULAR
  Filled 2012-03-01: qty 250

## 2012-03-01 MED ORDER — LIDOCAINE HCL (PF) 1 % IJ SOLN
INTRAMUSCULAR | Status: AC
  Administered 2012-03-01: 2.1 mL
  Filled 2012-03-01: qty 5

## 2012-03-01 MED ORDER — TRAMADOL HCL 50 MG PO TABS: 50.0000 mg | ORAL_TABLET | Freq: Four times a day (QID) | ORAL | Status: DC | PRN

## 2012-03-01 MED ORDER — DOXYCYCLINE HYCLATE 100 MG PO CAPS: 100.0000 mg | ORAL_CAPSULE | Freq: Two times a day (BID) | ORAL | Status: DC

## 2012-03-01 NOTE — ED Provider Notes (Signed)
History     CSN: [id number]  Arrival date & time 03/01/12  1143   First MD Initiated Contact with Patient 03/01/12 1239      Chief Complaint  Patient presents with  . Testicle Pain    (Consider location/radiation/quality/duration/timing/severity/associated sxs/prior treatment) HPI Comments: Patient presents with a chief complaint of left testicular pain.  He reports that seven years ago he was found to have a testicular mass.  He was seen by Urology at that time and states that he was told that the mass was benign.  He reports that the mass was the size of a baseball.  He opted not to have surgery at that time.  Three days ago he states that the mass resolved, but he then started having pain in that area.  He is sexually active.    Patient is a 42 y.o. male presenting with testicular pain. The history is provided by the patient.  Testicle Pain This is a new problem. Episode onset: 3 days ago. The problem occurs constantly. The problem has been unchanged. Pertinent negatives include no abdominal pain, chills, fever, nausea, rash, urinary symptoms or vomiting. Nothing aggravates the symptoms. He has tried nothing for the symptoms.    Past Medical History  Diagnosis Date  . Sarcoidosis   . Bronchitis   . Reflux   . Testicular tumor 2007    Past Surgical History  Procedure Date  . Arm surgery   . Lung biopsy     No family history on file.  History  Substance Use Topics  . Smoking status: Current Every Day Smoker -- 1.0 packs/day    Types: Cigarettes  . Smokeless tobacco: Not on file  . Alcohol Use: Yes      Review of Systems  Constitutional: Negative for fever and chills.  Gastrointestinal: Negative for nausea, vomiting and abdominal pain.  Genitourinary: Positive for testicular pain. Negative for dysuria, frequency, hematuria, decreased urine volume, discharge, penile swelling, scrotal swelling, genital sores and penile pain.  Skin: Negative for rash.  All other  systems reviewed and are negative.    Allergies  Review of patient's allergies indicates no known allergies.  Home Medications   Current Outpatient Rx  Name  Route  Sig  Dispense  Refill  . ALBUTEROL SULFATE HFA 108 (90 BASE) MCG/ACT IN AERS   Inhalation   Inhale 2 puffs into the lungs every 6 (six) hours as needed. For shortness of breath and wheezing          . VENTOLIN HFA IN   Inhalation   Inhale into the lungs as needed.          Marland Kitchen PRILOSEC PO   Oral   Take by mouth.           BP 132/87  Pulse 73  Temp 98.5 F (36.9 C) (Oral)  Resp 16  SpO2 100%  Physical Exam  Nursing note and vitals reviewed. Constitutional: He appears well-developed and well-nourished. No distress.  HENT:  Head: Normocephalic and atraumatic.  Neck: Normal range of motion. Neck supple.  Cardiovascular: Normal rate, regular rhythm and normal heart sounds.   Pulmonary/Chest: Effort normal and breath sounds normal.  Abdominal: Soft. There is no tenderness. Hernia confirmed negative in the right inguinal area and confirmed negative in the left inguinal area.  Genitourinary: Penis normal. Right testis shows no mass, no swelling and no tenderness. Right testis is descended. Cremasteric reflex is not absent on the right side. Left testis shows tenderness. Left testis  shows no mass and no swelling. Left testis is descended. Cremasteric reflex is not absent on the left side. No penile erythema. No discharge found.  Lymphadenopathy:       Right: No inguinal adenopathy present.       Left: No inguinal adenopathy present.  Neurological: He is alert.  Skin: Skin is warm and dry. He is not diaphoretic.  Psychiatric: He has a normal mood and affect.    ED Course  Procedures (including critical care time)  Labs Reviewed - No data to display US Scrotum  03/01/2012  *RADIOLOGY REPORT*  Clinical Data:  Left testicular mass discovered 2007 per patient, no treatment  SCROTAL ULTRASOUND DOPPLER ULTRASOUND  OF THE TESTICLES  Technique: Complete ultrasound examination of the testicles, epididymis, and other scrotal structures was performed.  Color and spectral Doppler ultrasound were also utilized to evaluate blood flow to the testicles.  Comparison:  No similar prior study is available for comparison.  Findings:  Right testis:  5.2 x 3.9 x 2.7 cm  Left testis:  4.6 x 4.3 x 2.4 cm  Right epididymis:  Simple appearing epididymal cysts incidentally noted, 2.0 x 1.6 x 1.4 cm.  Otherwise normal.  Left epididymis:  There is a cystic structure in the region of the expected location of the head of the epididymis measuring at least 4.4 cm maximally, incompletely visualized.  The epididymis itself is not visualized.  Hydrocele:  Absent  Varicocele:  Absent  Pulsed Doppler interrogation of both testes demonstrates low resistance arterial and venous wave forms bilaterally.  IMPRESSION: Bilateral (right, probable left) epididymal cysts.  No intratesticular mass.  No sonographic evidence for testicular torsion. Critical Value/emergent results were called by telephone at the time of interpretation on 03/01/2012 at 1:45 p.m. to Dr. Fredderick Phenix, who verbally acknowledged these results.   Original Report Authenticated By: Christiana Pellant, M.D.    [place] Art/ven Flow Abd Pelv Doppler  03/01/2012  *RADIOLOGY REPORT*  Clinical Data:  Left testicular mass discovered 2007 per patient, no treatment  SCROTAL ULTRASOUND DOPPLER ULTRASOUND OF THE TESTICLES  Technique: Complete ultrasound examination of the testicles, epididymis, and other scrotal structures was performed.  Color and spectral Doppler ultrasound were also utilized to evaluate blood flow to the testicles.  Comparison:  No similar prior study is available for comparison.  Findings:  Right testis:  5.2 x 3.9 x 2.7 cm  Left testis:  4.6 x 4.3 x 2.4 cm  Right epididymis:  Simple appearing epididymal cysts incidentally noted, 2.0 x 1.6 x 1.4 cm.  Otherwise normal.  Left epididymis:  There is a  cystic structure in the region of the expected location of the head of the epididymis measuring at least 4.4 cm maximally, incompletely visualized.  The epididymis itself is not visualized.  Hydrocele:  Absent  Varicocele:  Absent  Pulsed Doppler interrogation of both testes demonstrates low resistance arterial and venous wave forms bilaterally.  IMPRESSION: Bilateral (right, probable left) epididymal cysts.  No intratesticular mass.  No sonographic evidence for testicular torsion. Critical Value/emergent results were called by telephone at the time of interpretation on 03/01/2012 at 1:45 p.m. to Dr. Fredderick Phenix, who verbally acknowledged these results.   Original Report Authenticated By: Christiana Pellant, M.D.      No diagnosis found.    MDM  Patient presenting with a chief complaint of left testicular pain that has been present for the past 3 days.  Scrotal ultrasound is negative for torsion.  Ultrasound does show a bilateral epidermal cyst.  Patient did have some tenderness to palpation of the left epididymis area on exam and is sexually active.  Therefore, patient treated for Epididymitis with Ceftriaxone and Doxycycline.  Patient given referral to Urology.  Return precautions discussed.        Pascal Lux [place], PA-C 03/01/12 [phone]

## 2012-03-01 NOTE — ED Notes (Addendum)
Pt having left testicular pain since Thursday.  Pt had tumor on same side, patient states it disappeared.  No dysuria.

## 2012-03-05 NOTE — ED Provider Notes (Signed)
Medical screening examination/treatment/procedure(s) were performed by non-physician practitioner and as supervising physician I was immediately available for consultation/collaboration.   Hayden Kihara, MD 03/05/12 0747 

## 2012-06-07 ENCOUNTER — Emergency Department (HOSPITAL_BASED_OUTPATIENT_CLINIC_OR_DEPARTMENT_OTHER)
Admission: EM | Admit: 2012-06-07 | Discharge: 2012-06-07 | Disposition: A | Discharge status: Home / Self Care | Payer: BC Managed Care – PPO | Attending: Emergency Medicine | Admitting: Emergency Medicine

## 2012-06-07 ENCOUNTER — Encounter (HOSPITAL_BASED_OUTPATIENT_CLINIC_OR_DEPARTMENT_OTHER): Payer: Self-pay | Admitting: *Deleted

## 2012-06-07 ENCOUNTER — Emergency Department (HOSPITAL_BASED_OUTPATIENT_CLINIC_OR_DEPARTMENT_OTHER): Payer: BC Managed Care – PPO

## 2012-06-07 DIAGNOSIS — K219 Gastro-esophageal reflux disease without esophagitis: Secondary | ICD-10-CM | POA: Insufficient documentation

## 2012-06-07 DIAGNOSIS — J209 Acute bronchitis, unspecified: Secondary | ICD-10-CM | POA: Insufficient documentation

## 2012-06-07 DIAGNOSIS — Z8619 Personal history of other infectious and parasitic diseases: Secondary | ICD-10-CM | POA: Insufficient documentation

## 2012-06-07 DIAGNOSIS — Z8709 Personal history of other diseases of the respiratory system: Secondary | ICD-10-CM | POA: Insufficient documentation

## 2012-06-07 DIAGNOSIS — Z79899 Other long term (current) drug therapy: Secondary | ICD-10-CM | POA: Insufficient documentation

## 2012-06-07 DIAGNOSIS — J40 Bronchitis, not specified as acute or chronic: Secondary | ICD-10-CM

## 2012-06-07 DIAGNOSIS — F17200 Nicotine dependence, unspecified, uncomplicated: Secondary | ICD-10-CM | POA: Insufficient documentation

## 2012-06-07 DIAGNOSIS — Z87448 Personal history of other diseases of urinary system: Secondary | ICD-10-CM | POA: Insufficient documentation

## 2012-06-07 IMAGING — CR DG CHEST 2V
2 series · 2 of 2 positions shown · non-contrast
Comparison: [DATE]

CLINICAL DATA: Cough, sarcoidosis, bronchitis

CHEST - 2 VIEW

[w chest pa]
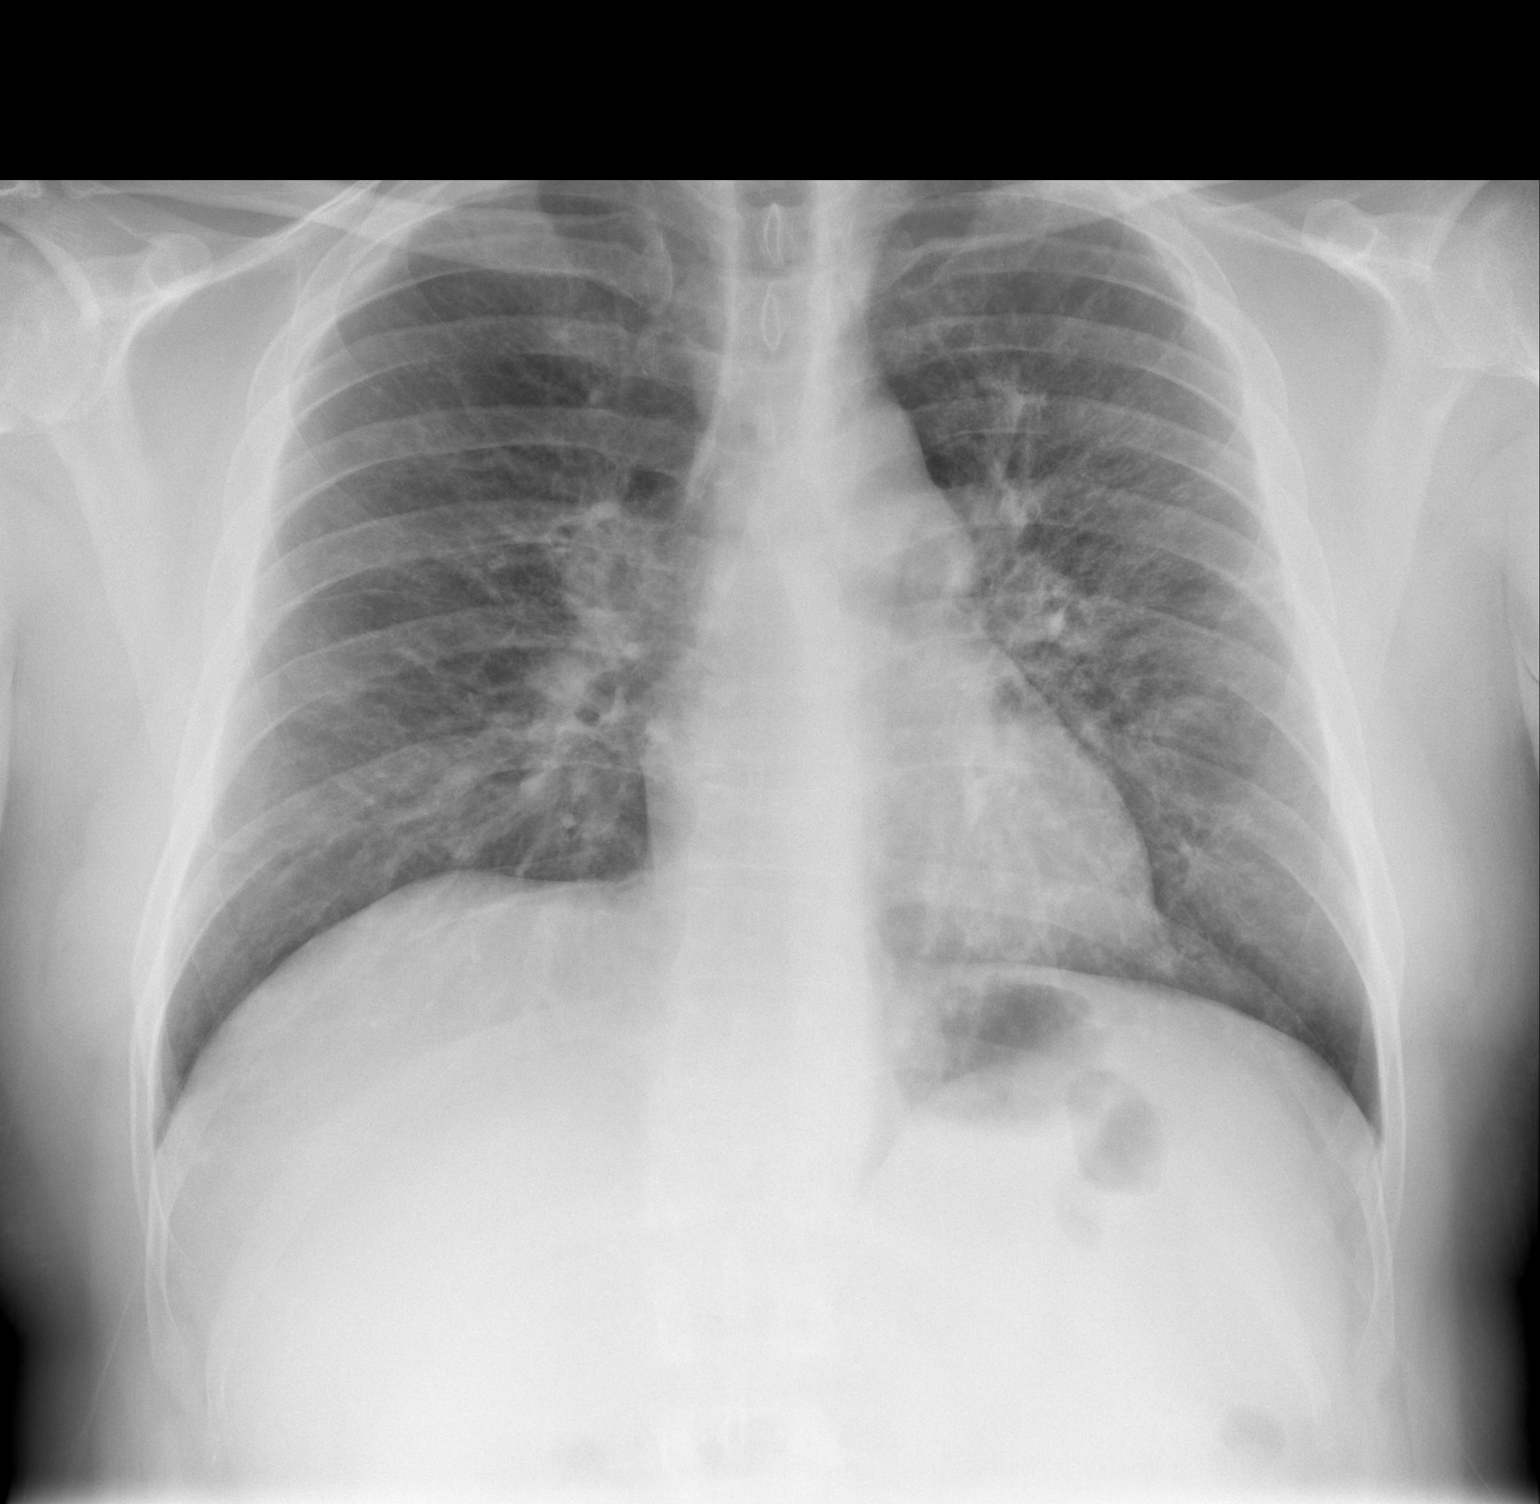

[w chest lat]
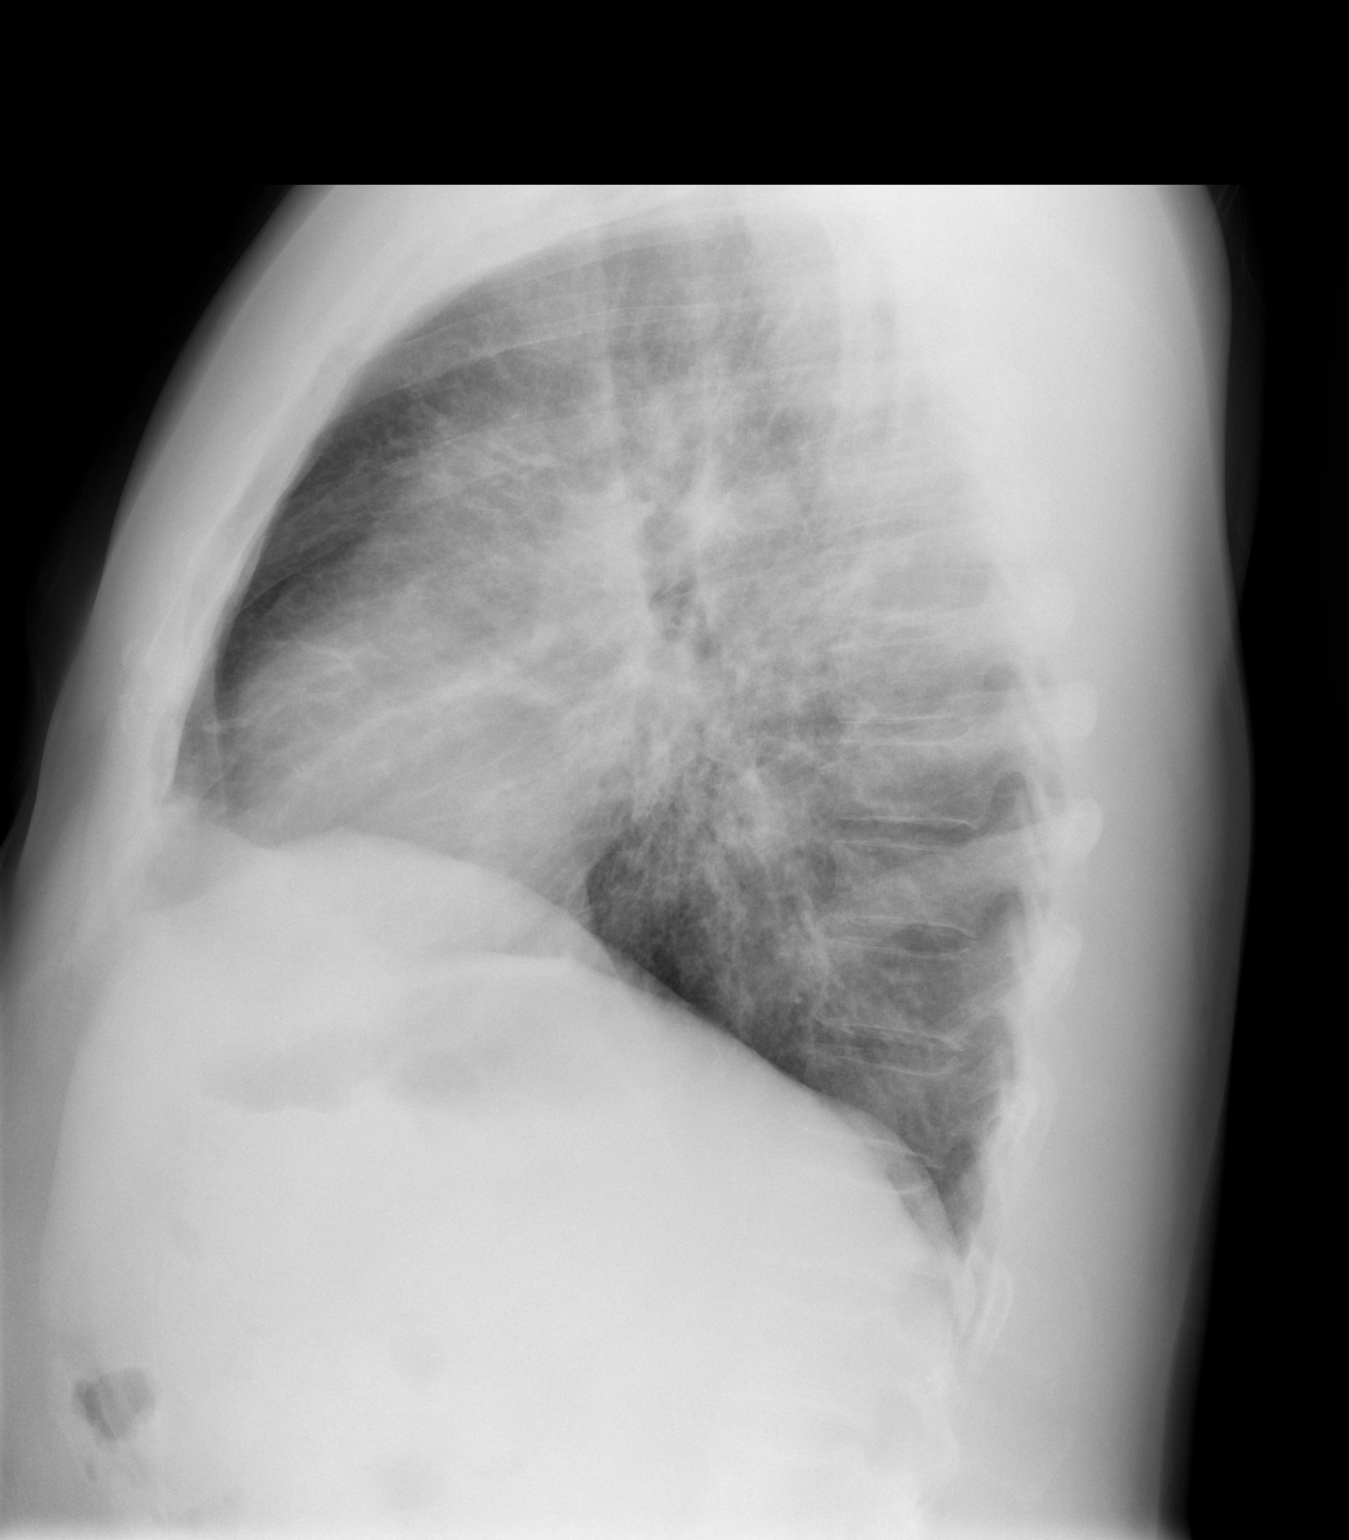

[2 of 2 positions shown; findings below may reference images not displayed]

FINDINGS: Increased interstitial markings/bronchitic changes,
possibly related to patient's history of sarcoidosis.  No focal
consolidation. No pleural effusion or pneumothorax.

The heart is normal in size.  Stable mild bilateral hilar
prominence, possibly reflecting adenopathy given in the history.

Visualized osseous structures are within normal limits.
IMPRESSION: No evidence of acute cardiopulmonary disease.

Increased interstitial markings with suspected hilar
lymphadenopathy, likely related to known sarcoidosis.

## 2012-06-07 MED ORDER — DEXTROMETHORPHAN POLISTIREX 30 MG/5ML PO LQCR: 60.0000 mg | ORAL | Status: DC | PRN

## 2012-06-07 MED ORDER — AZITHROMYCIN 250 MG PO TABS: 250.0000 mg | ORAL_TABLET | Freq: Every day | ORAL | Status: DC

## 2012-06-07 NOTE — ED Provider Notes (Signed)
History     CSN: [id number]  Arrival date & time 06/07/12  1421   First MD Initiated Contact with Patient 06/07/12 1438      Chief Complaint  Patient presents with  . Cough    (Consider location/radiation/quality/duration/timing/severity/associated sxs/prior treatment) HPI Comments: Patient is a 42 year old male with a past medical history of sarcoidosis who presents with a 3 week history of productive cough. Patient reports gradual onset and progressive worsening since the onset. Patient has not tried anything for symptoms. The cough is productive with green/yellow sputum. No aggravating/alleviating factors. No associated symptoms.    Past Medical History  Diagnosis Date  . Sarcoidosis   . Bronchitis   . Reflux   . Testicular tumor 2007    Past Surgical History  Procedure Laterality Date  . Arm surgery    . Lung biopsy      History reviewed. No pertinent family history.  History  Substance Use Topics  . Smoking status: Current Every Day Smoker -- 1.00 packs/day    Types: Cigarettes  . Smokeless tobacco: Not on file  . Alcohol Use: Yes      Review of Systems  Respiratory: Positive for cough.   All other systems reviewed and are negative.    Allergies  Review of patient's allergies indicates no known allergies.  Home Medications   Current Outpatient Rx  Name  Route  Sig  Dispense  Refill  . albuterol (PROVENTIL HFA;VENTOLIN HFA) 108 (90 BASE) MCG/ACT inhaler   Inhalation   Inhale 2 puffs into the lungs every 6 (six) hours as needed. For shortness of breath and wheezing          . Albuterol Sulfate (VENTOLIN HFA IN)   Inhalation   Inhale into the lungs as needed.          . doxycycline (VIBRAMYCIN) 100 MG capsule   Oral   Take 1 capsule (100 mg total) by mouth 2 (two) times daily.   20 capsule   0   . Omeprazole (PRILOSEC PO)   Oral   Take by mouth.         . traMADol (ULTRAM) 50 MG tablet   Oral   Take 1 tablet (50 mg total) by mouth  every 6 (six) hours as needed for pain.   15 tablet   0     BP 145/99  Pulse 90  Temp(Src) 98 F (36.7 C) (Oral)  Resp 18  Ht 5\' 9"  (1.753 m)  Wt 223 lb (101.152 kg)  BMI 32.92 kg/m2  SpO2 100%  Physical Exam  Nursing note and vitals reviewed. Constitutional: He is oriented to person, place, and time. He appears well-developed and well-nourished. No distress.  HENT:  Head: Normocephalic and atraumatic.  Eyes: Conjunctivae are normal.  Neck: Normal range of motion.  Cardiovascular: Normal rate and regular rhythm.  Exam reveals no gallop and no friction rub.   No murmur heard. Pulmonary/Chest: Effort normal. He has no wheezes. He has no rales. He exhibits no tenderness.  Rhonchi noted throughout bilateral lung fields.   Abdominal: Soft. There is no tenderness.  Musculoskeletal: Normal range of motion.  Neurological: He is alert and oriented to person, place, and time.  Speech is goal-oriented. Moves limbs without ataxia.   Skin: Skin is warm and dry.  Psychiatric: He has a normal mood and affect. His behavior is normal.    ED Course  Procedures (including critical care time)  Labs Reviewed - No data to display  Dg Chest 2 View  06/07/2012  *RADIOLOGY REPORT*  Clinical Data: Cough, sarcoidosis, bronchitis  CHEST - 2 VIEW  Comparison: 02/07/2012  Findings: Increased interstitial markings/bronchitic changes, possibly related to patient's history of sarcoidosis.  No focal consolidation. No pleural effusion or pneumothorax.  The heart is normal in size.  Stable mild bilateral hilar prominence, possibly reflecting adenopathy given in the history.  Visualized osseous structures are within normal limits.  IMPRESSION: No evidence of acute cardiopulmonary disease.  Increased interstitial markings with suspected hilar lymphadenopathy, likely related to known sarcoidosis.   Original Report Authenticated By: Charline Bills, M.D.      1. Bronchitis       MDM  4:22 PM Chest xray  shows no acute changes. Patient likely has bronchitis. I will treat him with a z-pack and delsym. Patient is afebrile with stable vitals. No further evaluation needed at this time. Patient instructed to return with worsening or concerning symptoms.         Emilia Beck, PA-C 06/08/12 1705

## 2012-06-07 NOTE — ED Notes (Addendum)
Pt states he has had a cough for about 3 weeks. Prod at times (yellow, green sputum). Causes him to gag. Here with girlfriend.

## 2012-06-10 NOTE — ED Provider Notes (Signed)
Medical screening examination/treatment/procedure(s) were performed by non-physician practitioner and as supervising physician I was immediately available for consultation/collaboration.  Martha K Linker, MD 06/10/12 1737 

## 2012-08-12 ENCOUNTER — Encounter (HOSPITAL_BASED_OUTPATIENT_CLINIC_OR_DEPARTMENT_OTHER): Payer: Self-pay

## 2012-08-12 ENCOUNTER — Emergency Department (HOSPITAL_BASED_OUTPATIENT_CLINIC_OR_DEPARTMENT_OTHER)
Admission: EM | Admit: 2012-08-12 | Discharge: 2012-08-12 | Disposition: A | Discharge status: Home / Self Care | Payer: BC Managed Care – PPO | Attending: Emergency Medicine | Admitting: Emergency Medicine

## 2012-08-12 ENCOUNTER — Emergency Department (HOSPITAL_BASED_OUTPATIENT_CLINIC_OR_DEPARTMENT_OTHER): Payer: BC Managed Care – PPO

## 2012-08-12 DIAGNOSIS — J029 Acute pharyngitis, unspecified: Secondary | ICD-10-CM | POA: Insufficient documentation

## 2012-08-12 DIAGNOSIS — R05 Cough: Secondary | ICD-10-CM | POA: Insufficient documentation

## 2012-08-12 DIAGNOSIS — Z87448 Personal history of other diseases of urinary system: Secondary | ICD-10-CM | POA: Insufficient documentation

## 2012-08-12 DIAGNOSIS — J3489 Other specified disorders of nose and nasal sinuses: Secondary | ICD-10-CM | POA: Insufficient documentation

## 2012-08-12 DIAGNOSIS — Z8619 Personal history of other infectious and parasitic diseases: Secondary | ICD-10-CM | POA: Insufficient documentation

## 2012-08-12 DIAGNOSIS — Z79899 Other long term (current) drug therapy: Secondary | ICD-10-CM | POA: Insufficient documentation

## 2012-08-12 DIAGNOSIS — R51 Headache: Secondary | ICD-10-CM | POA: Insufficient documentation

## 2012-08-12 DIAGNOSIS — K219 Gastro-esophageal reflux disease without esophagitis: Secondary | ICD-10-CM | POA: Insufficient documentation

## 2012-08-12 DIAGNOSIS — Z8709 Personal history of other diseases of the respiratory system: Secondary | ICD-10-CM | POA: Insufficient documentation

## 2012-08-12 DIAGNOSIS — R059 Cough, unspecified: Secondary | ICD-10-CM | POA: Insufficient documentation

## 2012-08-12 DIAGNOSIS — F17200 Nicotine dependence, unspecified, uncomplicated: Secondary | ICD-10-CM | POA: Insufficient documentation

## 2012-08-12 IMAGING — CR DG CHEST 2V
2 series · 2 of 2 positions shown · non-contrast
Comparison: [DATE]

CLINICAL DATA: Upper respiratory infection.  Cough, congestion.

CHEST - 2 VIEW

[w chest pa]
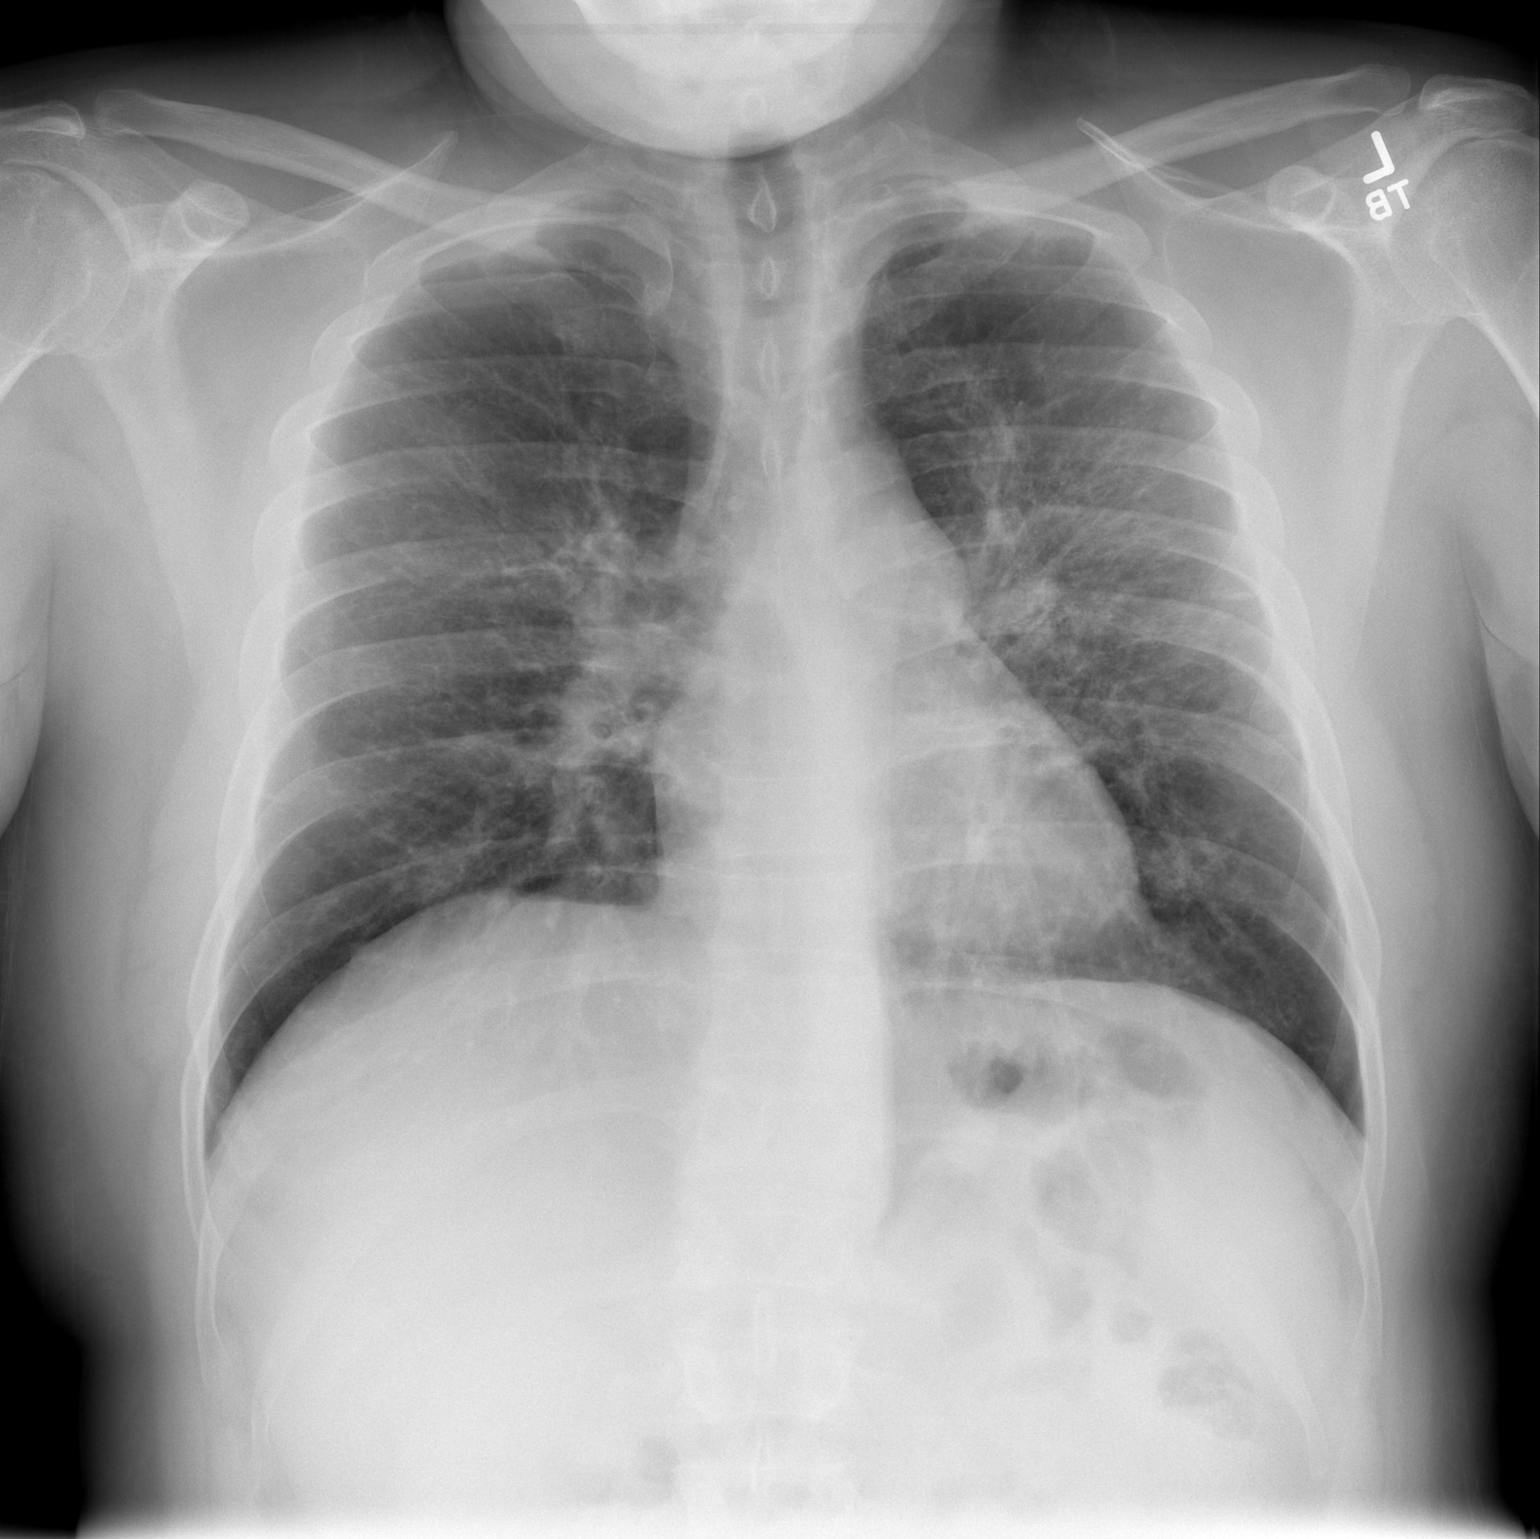

[w chest lat]
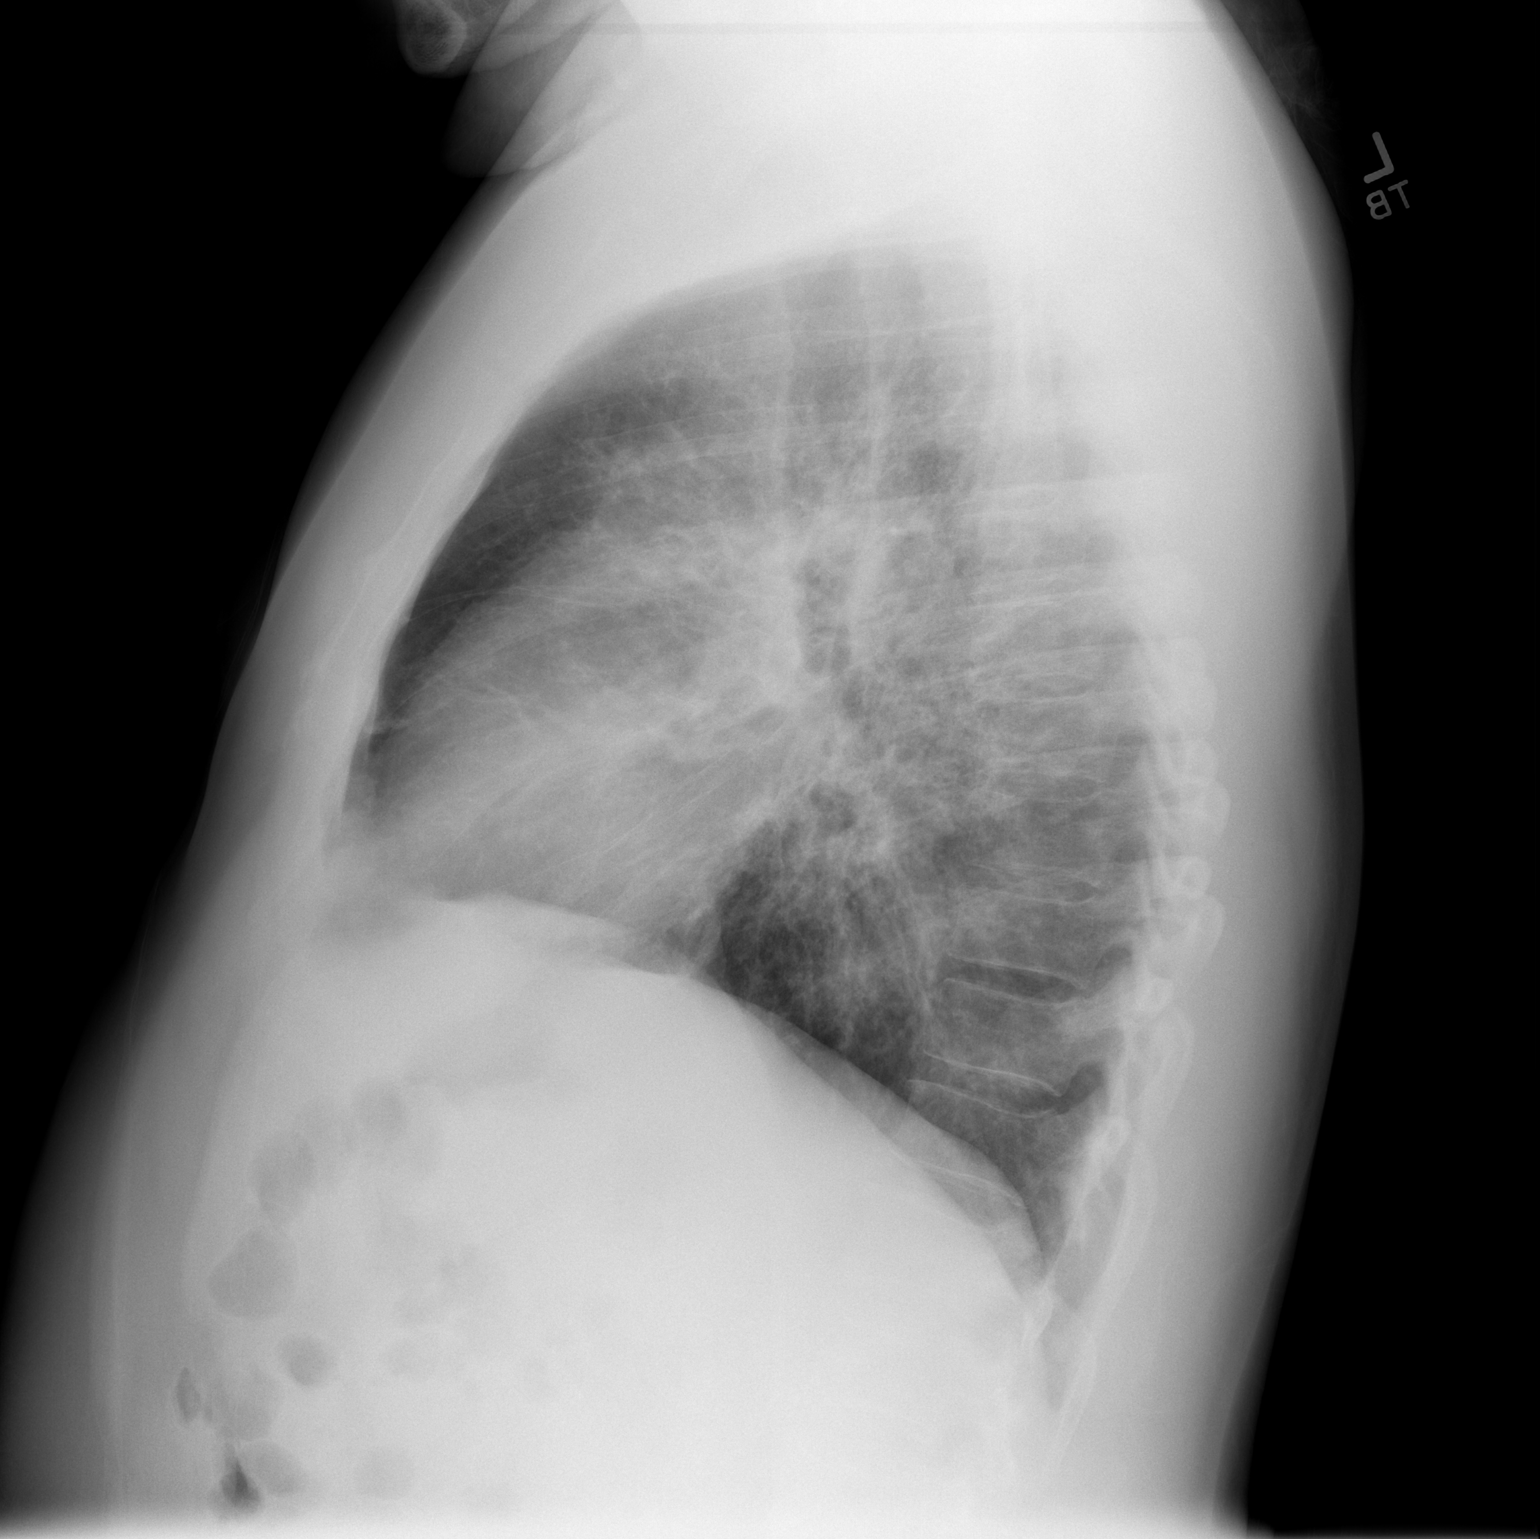

[2 of 2 positions shown; findings below may reference images not displayed]

FINDINGS: Heart is normal size.  Central peribronchial thickening
with increased interstitial markings.  Suspect mild bilateral hilar
and AP window adenopathy, similar to prior study, likely related to
known sarcoidosis.  No definite acute process.  No effusions.  No
acute bony abnormality.
IMPRESSION: Stable chronic changes and probable adenopathy, likely related to
sarcoid.  No definite acute process.

## 2012-08-12 MED ORDER — AZITHROMYCIN 250 MG PO TABS: 250.0000 mg | ORAL_TABLET | Freq: Every day | ORAL | Status: DC

## 2012-08-12 MED ORDER — HYDROCOD POLST-CHLORPHEN POLST 10-8 MG/5ML PO LQCR: 5.0000 mL | Freq: Two times a day (BID) | ORAL | Status: DC | PRN

## 2012-08-12 NOTE — ED Notes (Signed)
Pt c/o sinus pain, pressure congestion, states that he has had runny nose, postural drainage, sore throat, cough, headache, sneezing.

## 2012-08-12 NOTE — ED Provider Notes (Signed)
Medical screening examination/treatment/procedure(s) were performed by non-physician practitioner and as supervising physician I was immediately available for consultation/collaboration.    Seeley Hissong J. Corissa Oguinn, MD 08/12/12 2304 

## 2012-08-12 NOTE — ED Provider Notes (Signed)
History     CSN: [id number]  Arrival date & time 08/12/12  1826   First MD Initiated Contact with Patient 08/12/12 1845      Chief Complaint  Patient presents with  . URI    (Consider location/radiation/quality/duration/timing/severity/associated sxs/prior treatment) Patient is a 42 y.o. male presenting with URI. The history is provided by the patient. No language interpreter was used.  URI Presenting symptoms: congestion, cough, facial pain and sore throat   Severity:  Moderate Onset quality:  Gradual Timing:  Constant Progression:  Worsening Chronicity:  New Relieved by:  Nothing Worsened by:  Nothing tried Ineffective treatments:  None tried Associated symptoms: sinus pain   Associated symptoms: no wheezing     Past Medical History  Diagnosis Date  . Sarcoidosis   . Bronchitis   . Reflux   . Testicular tumor 2007    Past Surgical History  Procedure Laterality Date  . Arm surgery    . Lung biopsy      History reviewed. No pertinent family history.  History  Substance Use Topics  . Smoking status: Current Every Day Smoker -- 1.00 packs/day for 25 years    Types: Cigarettes  . Smokeless tobacco: Never Used  . Alcohol Use: Yes      Review of Systems  HENT: Positive for congestion and sore throat.   Respiratory: Positive for cough. Negative for wheezing.   Cardiovascular: Negative.     Allergies  Review of patient's allergies indicates no known allergies.  Home Medications   Current Outpatient Rx  Name  Route  Sig  Dispense  Refill  . albuterol (PROVENTIL HFA;VENTOLIN HFA) 108 (90 BASE) MCG/ACT inhaler   Inhalation   Inhale 2 puffs into the lungs every 6 (six) hours as needed. For shortness of breath and wheezing          . Albuterol Sulfate (VENTOLIN HFA IN)   Inhalation   Inhale into the lungs as needed.          . Omeprazole (PRILOSEC PO)   Oral   Take by mouth.         Marland Kitchen azithromycin (ZITHROMAX Z-PAK) 250 MG tablet   Oral  Take 1 tablet (250 mg total) by mouth daily. 500mg  PO day 1, then 250mg  PO days 205   6 tablet   0   . azithromycin (ZITHROMAX) 250 MG tablet   Oral   Take 1 tablet (250 mg total) by mouth daily. Take first 2 tablets together, then 1 every day until finished.   6 tablet   0   . chlorpheniramine-HYDROcodone (TUSSIONEX PENNKINETIC ER) 10-8 MG/5ML LQCR   Oral   Take 5 mLs by mouth every 12 (twelve) hours as needed.   140 mL   0   . dextromethorphan (DELSYM) 30 MG/5ML liquid   Oral   Take 10 mLs (60 mg total) by mouth as needed for cough.   89 mL   0   . doxycycline (VIBRAMYCIN) 100 MG capsule   Oral   Take 1 capsule (100 mg total) by mouth 2 (two) times daily.   20 capsule   0   . traMADol (ULTRAM) 50 MG tablet   Oral   Take 1 tablet (50 mg total) by mouth every 6 (six) hours as needed for pain.   15 tablet   0     BP 134/102  Pulse 100  Temp(Src) 99.5 F (37.5 C) (Oral)  Resp 16  Ht 5\' 9"  (1.753 m)  Wt  222 lb (100.699 kg)  BMI 32.77 kg/m2  SpO2 100%  Physical Exam  Nursing note and vitals reviewed. Constitutional: He is oriented to person, place, and time. He appears well-developed and well-nourished.  HENT:  Right Ear: External ear normal.  Left Ear: External ear normal.  Nose: Rhinorrhea present.  Mouth/Throat: Posterior oropharyngeal erythema present.  Eyes: Conjunctivae and EOM are normal.  Cardiovascular: Normal rate and regular rhythm.   Pulmonary/Chest: Effort normal and breath sounds normal.  Musculoskeletal: Normal range of motion.  Neurological: He is alert and oriented to person, place, and time.    ED Course  Procedures (including critical care time)  Labs Reviewed - No data to display Dg Chest 2 View  08/12/2012   *RADIOLOGY REPORT*  Clinical Data: Upper respiratory infection.  Cough, congestion.  CHEST - 2 VIEW  Comparison: 06/07/2012  Findings: Heart is normal size.  Central peribronchial thickening with increased interstitial markings.   Suspect mild bilateral hilar and AP window adenopathy, similar to prior study, likely related to known sarcoidosis.  No definite acute process.  No effusions.  No acute bony abnormality.  IMPRESSION: Stable chronic changes and probable adenopathy, likely related to sarcoid.  No definite acute process.   Original Report Authenticated By: Charlett Nose, M.D.     1. Cough       MDM  Pt treated with antibiotics with history of sarcoidosis       Teressa Lower, NP 08/12/12 2105

## 2012-09-19 ENCOUNTER — Emergency Department (HOSPITAL_BASED_OUTPATIENT_CLINIC_OR_DEPARTMENT_OTHER)
Admission: EM | Admit: 2012-09-19 | Discharge: 2012-09-19 | Disposition: A | Discharge status: Home / Self Care | Payer: BC Managed Care – PPO | Attending: Emergency Medicine | Admitting: Emergency Medicine

## 2012-09-19 ENCOUNTER — Emergency Department (HOSPITAL_BASED_OUTPATIENT_CLINIC_OR_DEPARTMENT_OTHER): Payer: BC Managed Care – PPO

## 2012-09-19 ENCOUNTER — Encounter (HOSPITAL_BASED_OUTPATIENT_CLINIC_OR_DEPARTMENT_OTHER): Payer: Self-pay | Admitting: Emergency Medicine

## 2012-09-19 DIAGNOSIS — Z8709 Personal history of other diseases of the respiratory system: Secondary | ICD-10-CM | POA: Insufficient documentation

## 2012-09-19 DIAGNOSIS — Z8719 Personal history of other diseases of the digestive system: Secondary | ICD-10-CM | POA: Insufficient documentation

## 2012-09-19 DIAGNOSIS — R0602 Shortness of breath: Secondary | ICD-10-CM | POA: Insufficient documentation

## 2012-09-19 DIAGNOSIS — F17200 Nicotine dependence, unspecified, uncomplicated: Secondary | ICD-10-CM | POA: Insufficient documentation

## 2012-09-19 DIAGNOSIS — Z87448 Personal history of other diseases of urinary system: Secondary | ICD-10-CM | POA: Insufficient documentation

## 2012-09-19 DIAGNOSIS — Z8619 Personal history of other infectious and parasitic diseases: Secondary | ICD-10-CM | POA: Insufficient documentation

## 2012-09-19 DIAGNOSIS — R071 Chest pain on breathing: Secondary | ICD-10-CM | POA: Insufficient documentation

## 2012-09-19 DIAGNOSIS — R059 Cough, unspecified: Secondary | ICD-10-CM | POA: Insufficient documentation

## 2012-09-19 DIAGNOSIS — R0789 Other chest pain: Secondary | ICD-10-CM

## 2012-09-19 DIAGNOSIS — R05 Cough: Secondary | ICD-10-CM | POA: Insufficient documentation

## 2012-09-19 LAB — BASIC METABOLIC PANEL
CO2: 24 mEq/L (ref 19–32)
Glucose, Bld: 109 mg/dL — ABNORMAL HIGH (ref 70–99)
Potassium: 3.4 mEq/L — ABNORMAL LOW (ref 3.5–5.1)
Sodium: 139 mEq/L (ref 135–145)

## 2012-09-19 LAB — CBC WITH DIFFERENTIAL/PLATELET
Basophils Absolute: 0 10*3/uL (ref 0.0–0.1)
Lymphocytes Relative: 19 % (ref 12–46)
Lymphs Abs: 1.6 10*3/uL (ref 0.7–4.0)
Neutro Abs: 5.8 10*3/uL (ref 1.7–7.7)
Neutrophils Relative %: 67 % (ref 43–77)
Platelets: 219 10*3/uL (ref 150–400)
RBC: 5.24 MIL/uL (ref 4.22–5.81)
WBC: 8.6 10*3/uL (ref 4.0–10.5)

## 2012-09-19 LAB — TROPONIN I: Troponin I: <0.3 ng/mL (ref <0.30)

## 2012-09-19 IMAGING — CR DG CHEST 2V
2 series · 2 of 2 positions shown · non-contrast
Comparison: [DATE]

CLINICAL DATA: Chest pain and shortness of breath; history of
sarcoidosis.

CHEST - 2 VIEW

[w chest pa]
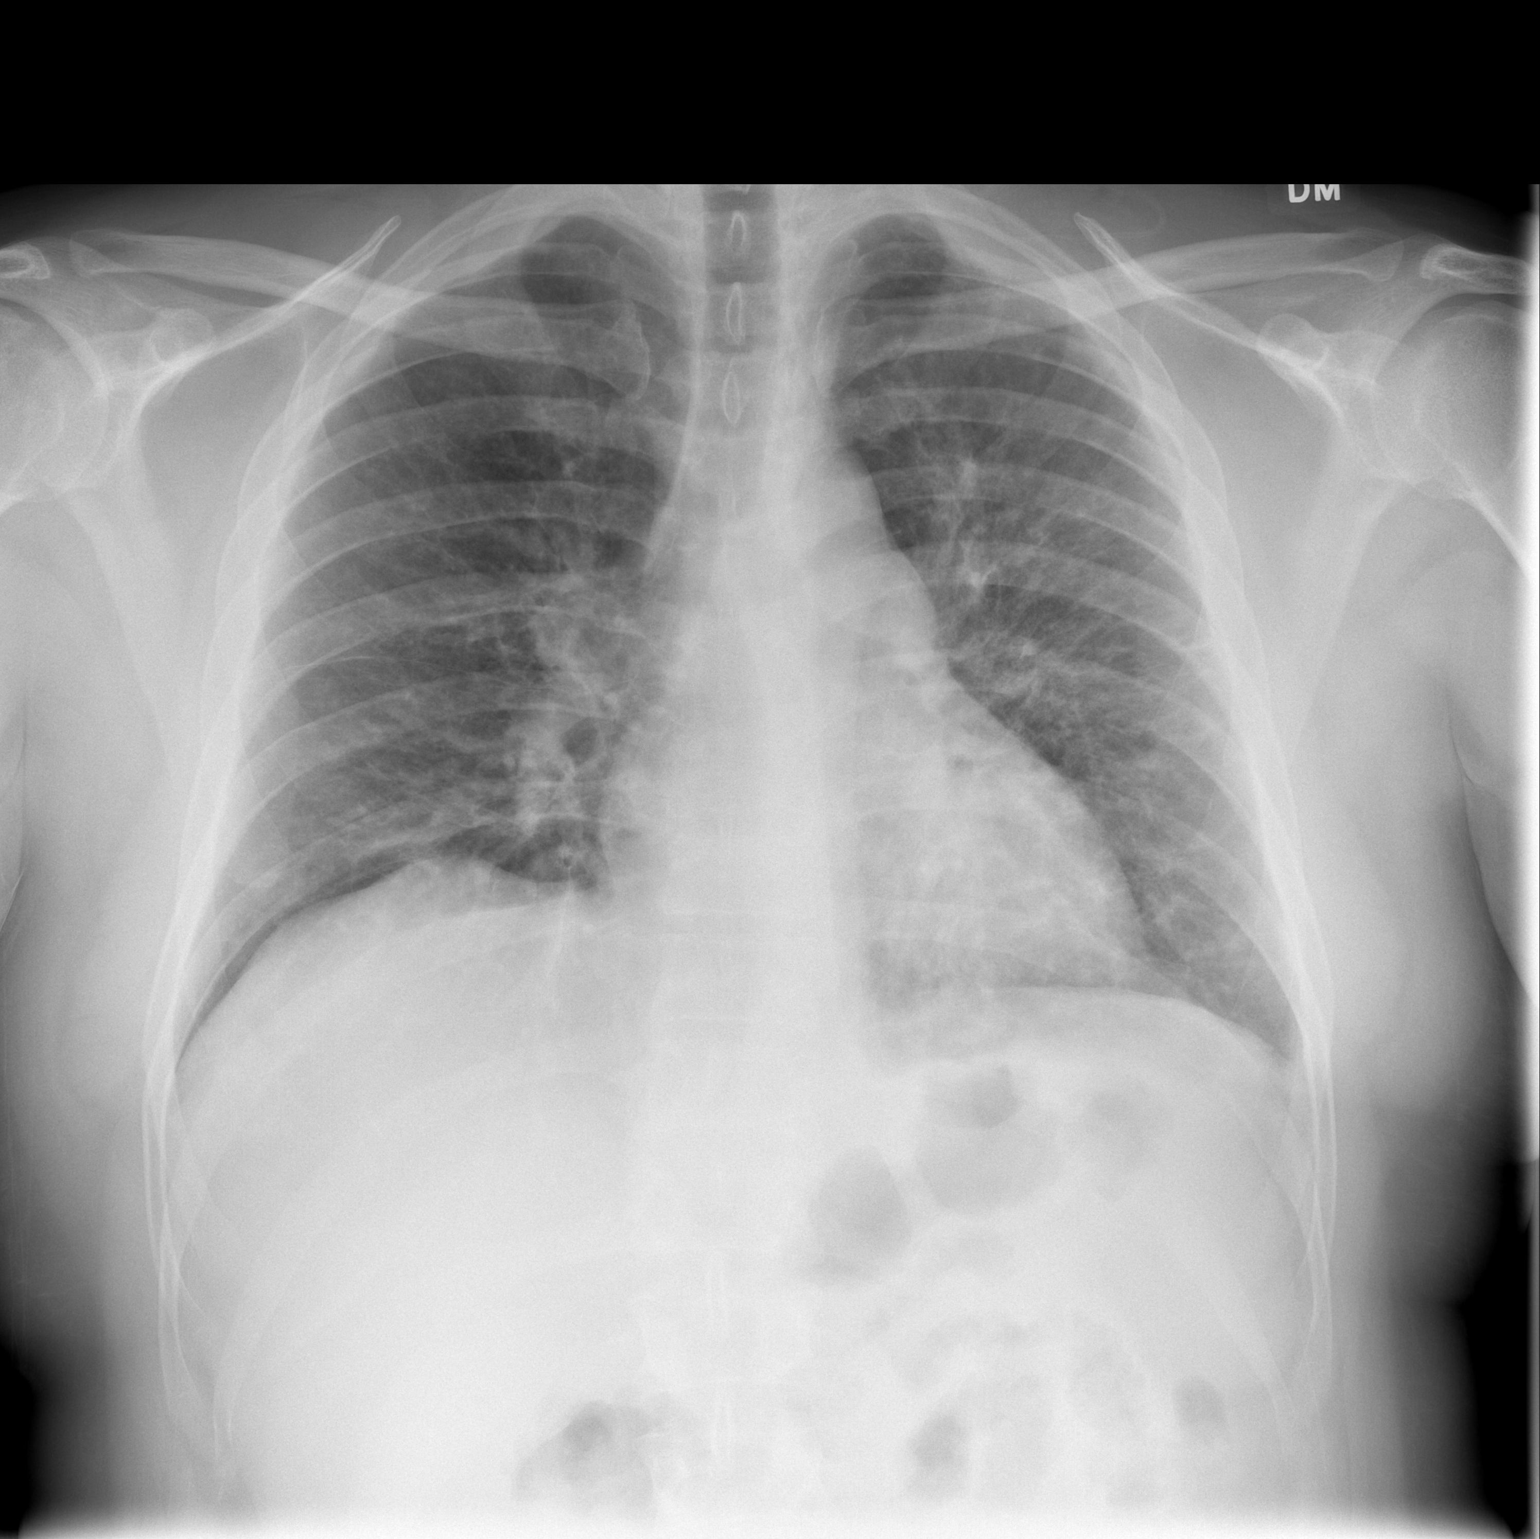

[w chest lat]
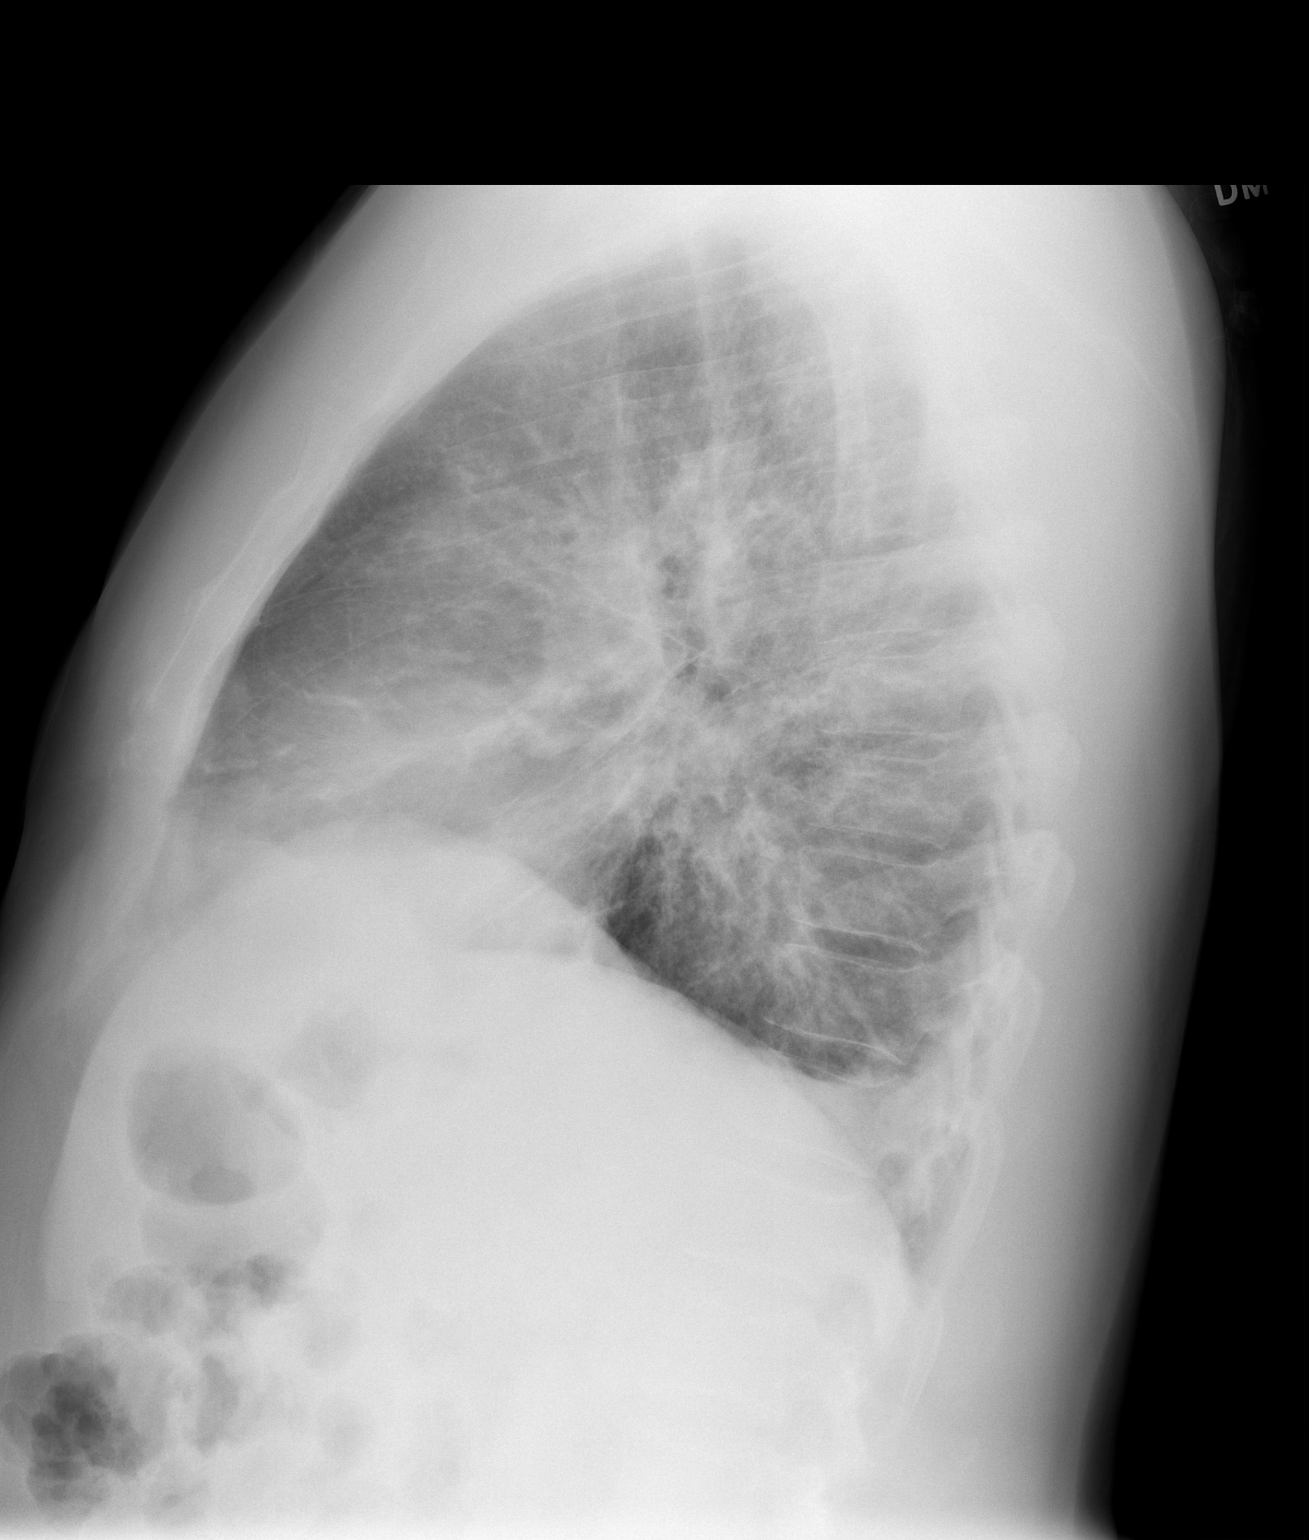

[2 of 2 positions shown; findings below may reference images not displayed]

FINDINGS: There is diffuse interstitial prominence, more on the
left than on the right, stable.  There is a fullness in both hilar
regions, most likely representing a degree of adenopathy, stable.
There is no new opacity.  There is no airspace consolidation.
Heart size and pulmonary vascularity are normal.  No pneumothorax.
No bone lesions.
IMPRESSION: Adenopathy and interstitial prominence, stable.  These changes are
associated with chronic sarcoidosis.  No frank edema or
consolidation.

## 2012-09-19 MED ORDER — ASPIRIN 81 MG PO CHEW
324.0000 mg | CHEWABLE_TABLET | Freq: Once | ORAL | Status: AC
  Administered 2012-09-19: 324 mg via ORAL
  Filled 2012-09-19: qty 4

## 2012-09-19 MED ORDER — OXYCODONE-ACETAMINOPHEN 5-325 MG PO TABS: 1.0000 | ORAL_TABLET | Freq: Four times a day (QID) | ORAL | Status: DC | PRN

## 2012-09-19 MED ORDER — PREDNISONE 10 MG PO TABS: 40.0000 mg | ORAL_TABLET | Freq: Every day | ORAL | Status: DC

## 2012-09-19 NOTE — ED Notes (Signed)
Pt states he woke up Monday morning with chest pain radiating to left arm and lower back.  Pain worsens with deep breath, cough or movement.  Some sob and weakness.  Some diaphoresis at night.
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
## 2012-09-19 NOTE — ED Notes (Signed)
MD at bedside. 
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
## 2013-04-07 ENCOUNTER — Encounter (HOSPITAL_BASED_OUTPATIENT_CLINIC_OR_DEPARTMENT_OTHER): Payer: Self-pay | Admitting: Emergency Medicine

## 2013-04-07 ENCOUNTER — Emergency Department (HOSPITAL_BASED_OUTPATIENT_CLINIC_OR_DEPARTMENT_OTHER)
Admission: EM | Admit: 2013-04-07 | Discharge: 2013-04-07 | Disposition: A | Discharge status: Home / Self Care | Payer: BC Managed Care – PPO | Attending: Emergency Medicine | Admitting: Emergency Medicine

## 2013-04-07 ENCOUNTER — Emergency Department (HOSPITAL_BASED_OUTPATIENT_CLINIC_OR_DEPARTMENT_OTHER): Payer: BC Managed Care – PPO

## 2013-04-07 DIAGNOSIS — J40 Bronchitis, not specified as acute or chronic: Secondary | ICD-10-CM | POA: Insufficient documentation

## 2013-04-07 DIAGNOSIS — Z8547 Personal history of malignant neoplasm of testis: Secondary | ICD-10-CM | POA: Insufficient documentation

## 2013-04-07 DIAGNOSIS — Z8719 Personal history of other diseases of the digestive system: Secondary | ICD-10-CM | POA: Insufficient documentation

## 2013-04-07 DIAGNOSIS — R197 Diarrhea, unspecified: Secondary | ICD-10-CM | POA: Insufficient documentation

## 2013-04-07 DIAGNOSIS — J069 Acute upper respiratory infection, unspecified: Secondary | ICD-10-CM

## 2013-04-07 DIAGNOSIS — R1110 Vomiting, unspecified: Secondary | ICD-10-CM | POA: Insufficient documentation

## 2013-04-07 DIAGNOSIS — D869 Sarcoidosis, unspecified: Secondary | ICD-10-CM

## 2013-04-07 DIAGNOSIS — IMO0002 Reserved for concepts with insufficient information to code with codable children: Secondary | ICD-10-CM | POA: Insufficient documentation

## 2013-04-07 DIAGNOSIS — R000 Tachycardia, unspecified: Secondary | ICD-10-CM | POA: Insufficient documentation

## 2013-04-07 DIAGNOSIS — F17200 Nicotine dependence, unspecified, uncomplicated: Secondary | ICD-10-CM | POA: Insufficient documentation

## 2013-04-07 LAB — CBC WITH DIFFERENTIAL/PLATELET
BASOS ABS: 0 10*3/uL (ref 0.0–0.1)
BASOS PCT: 1 % (ref 0–1)
EOS ABS: 0.1 10*3/uL (ref 0.0–0.7)
Eosinophils Relative: 2 % (ref 0–5)
HCT: 51.8 % (ref 39.0–52.0)
HEMOGLOBIN: 17.9 g/dL — AB (ref 13.0–17.0)
Lymphocytes Relative: 13 % (ref 12–46)
Lymphs Abs: 0.8 10*3/uL (ref 0.7–4.0)
MCH: 29.4 pg (ref 26.0–34.0)
MCHC: 34.6 g/dL (ref 30.0–36.0)
MCV: 85.2 fL (ref 78.0–100.0)
MONOS PCT: 9 % (ref 3–12)
Monocytes Absolute: 0.6 10*3/uL (ref 0.1–1.0)
NEUTROS PCT: 77 % (ref 43–77)
Neutro Abs: 4.9 10*3/uL (ref 1.7–7.7)
Platelets: 286 10*3/uL (ref 150–400)
RBC: 6.08 MIL/uL — ABNORMAL HIGH (ref 4.22–5.81)
RDW: 15.5 % (ref 11.5–15.5)
WBC: 6.4 10*3/uL (ref 4.0–10.5)

## 2013-04-07 LAB — BASIC METABOLIC PANEL
BUN: 8 mg/dL (ref 6–23)
CO2: 22 mEq/L (ref 19–32)
CREATININE: 0.9 mg/dL (ref 0.50–1.35)
Calcium: 9.5 mg/dL (ref 8.4–10.5)
Chloride: 98 mEq/L (ref 96–112)
Glucose, Bld: 123 mg/dL — ABNORMAL HIGH (ref 70–99)
POTASSIUM: 4 meq/L (ref 3.7–5.3)
Sodium: 138 mEq/L (ref 137–147)

## 2013-04-07 IMAGING — CR DG CHEST 2V
2 series · 2 of 2 positions shown · non-contrast
Comparison: Prior chest x-ray [DATE]

CLINICAL DATA: Cough, fever: History of a sarcoidosis and tobacco
use

EXAM:
CHEST  2 VIEW

[w chest pa]
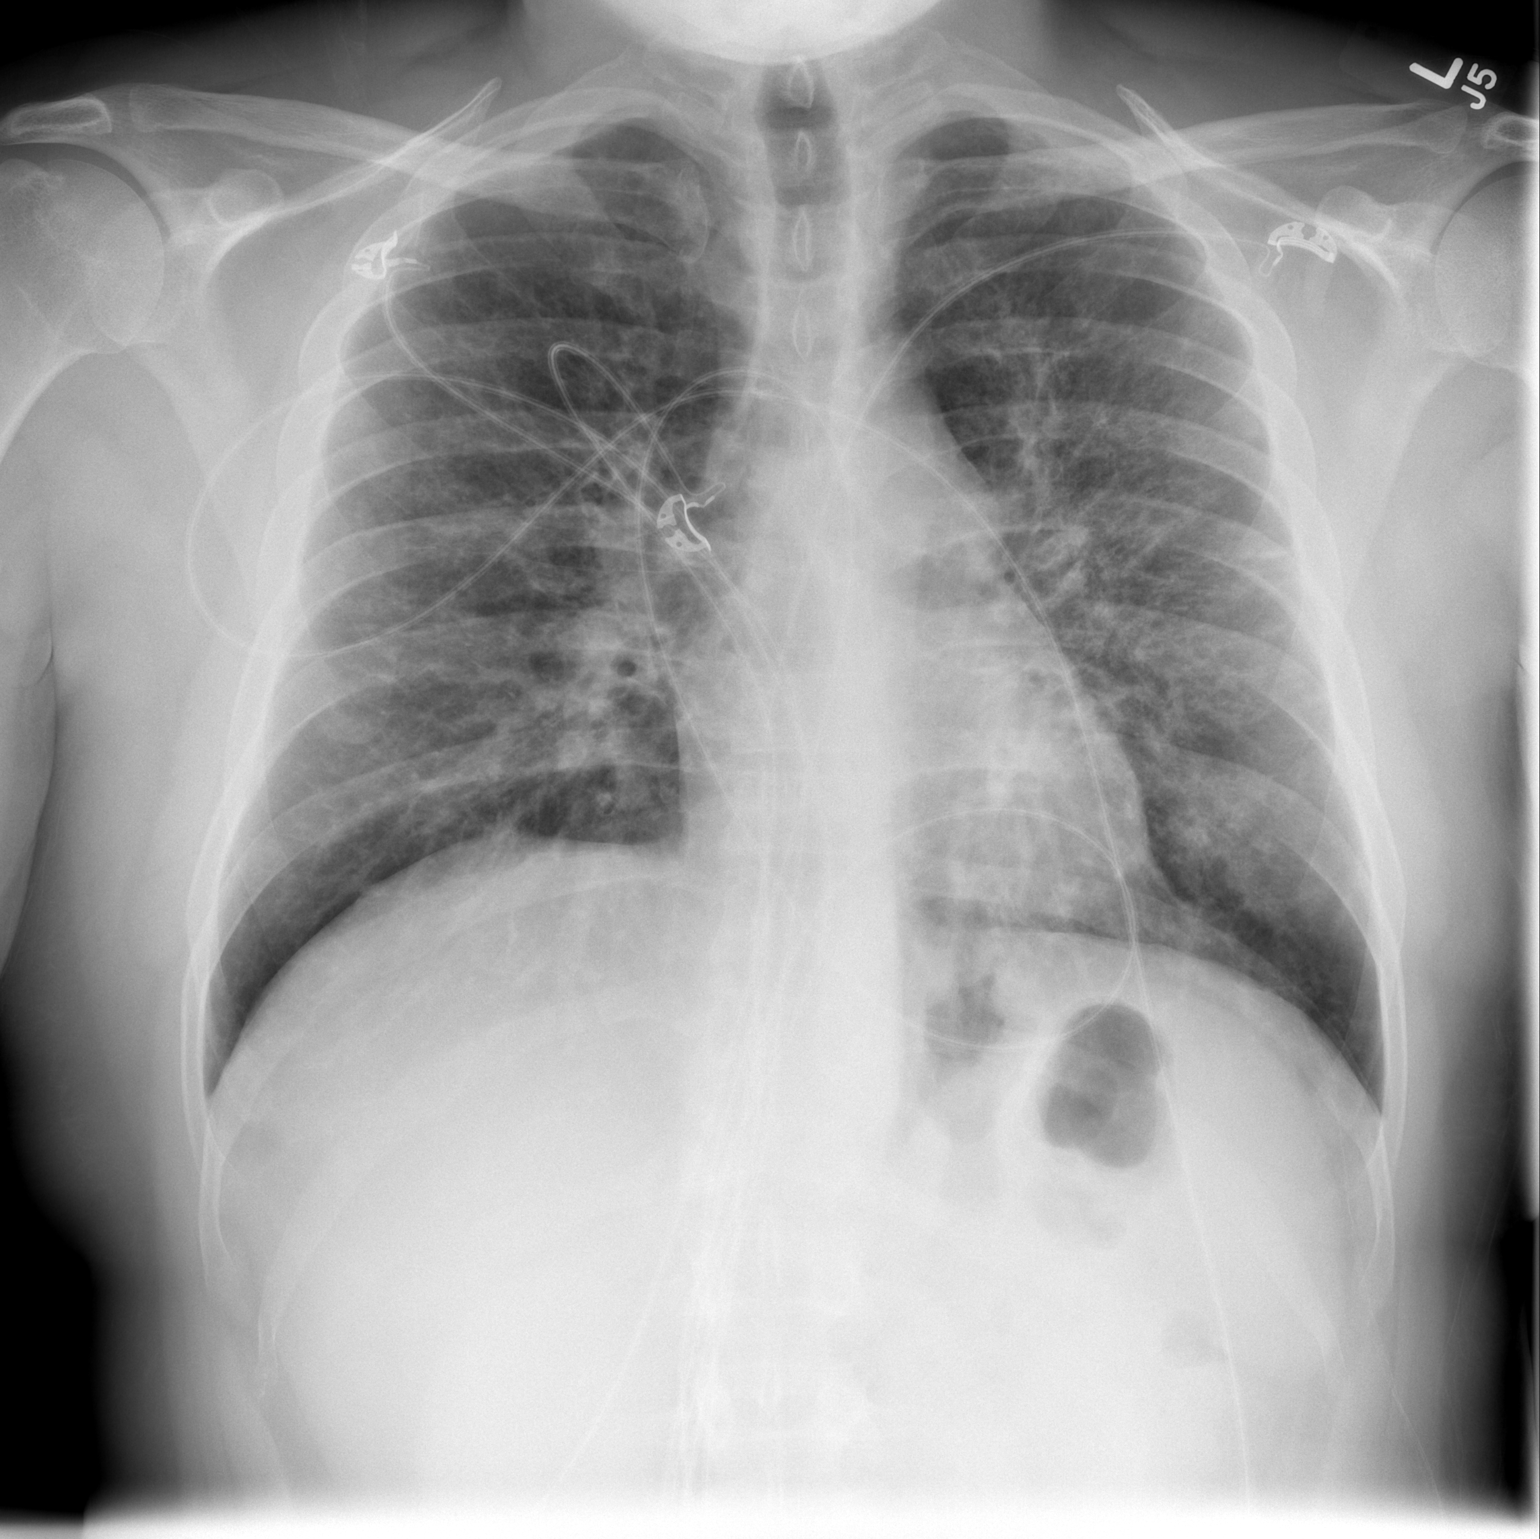

[w chest lat]
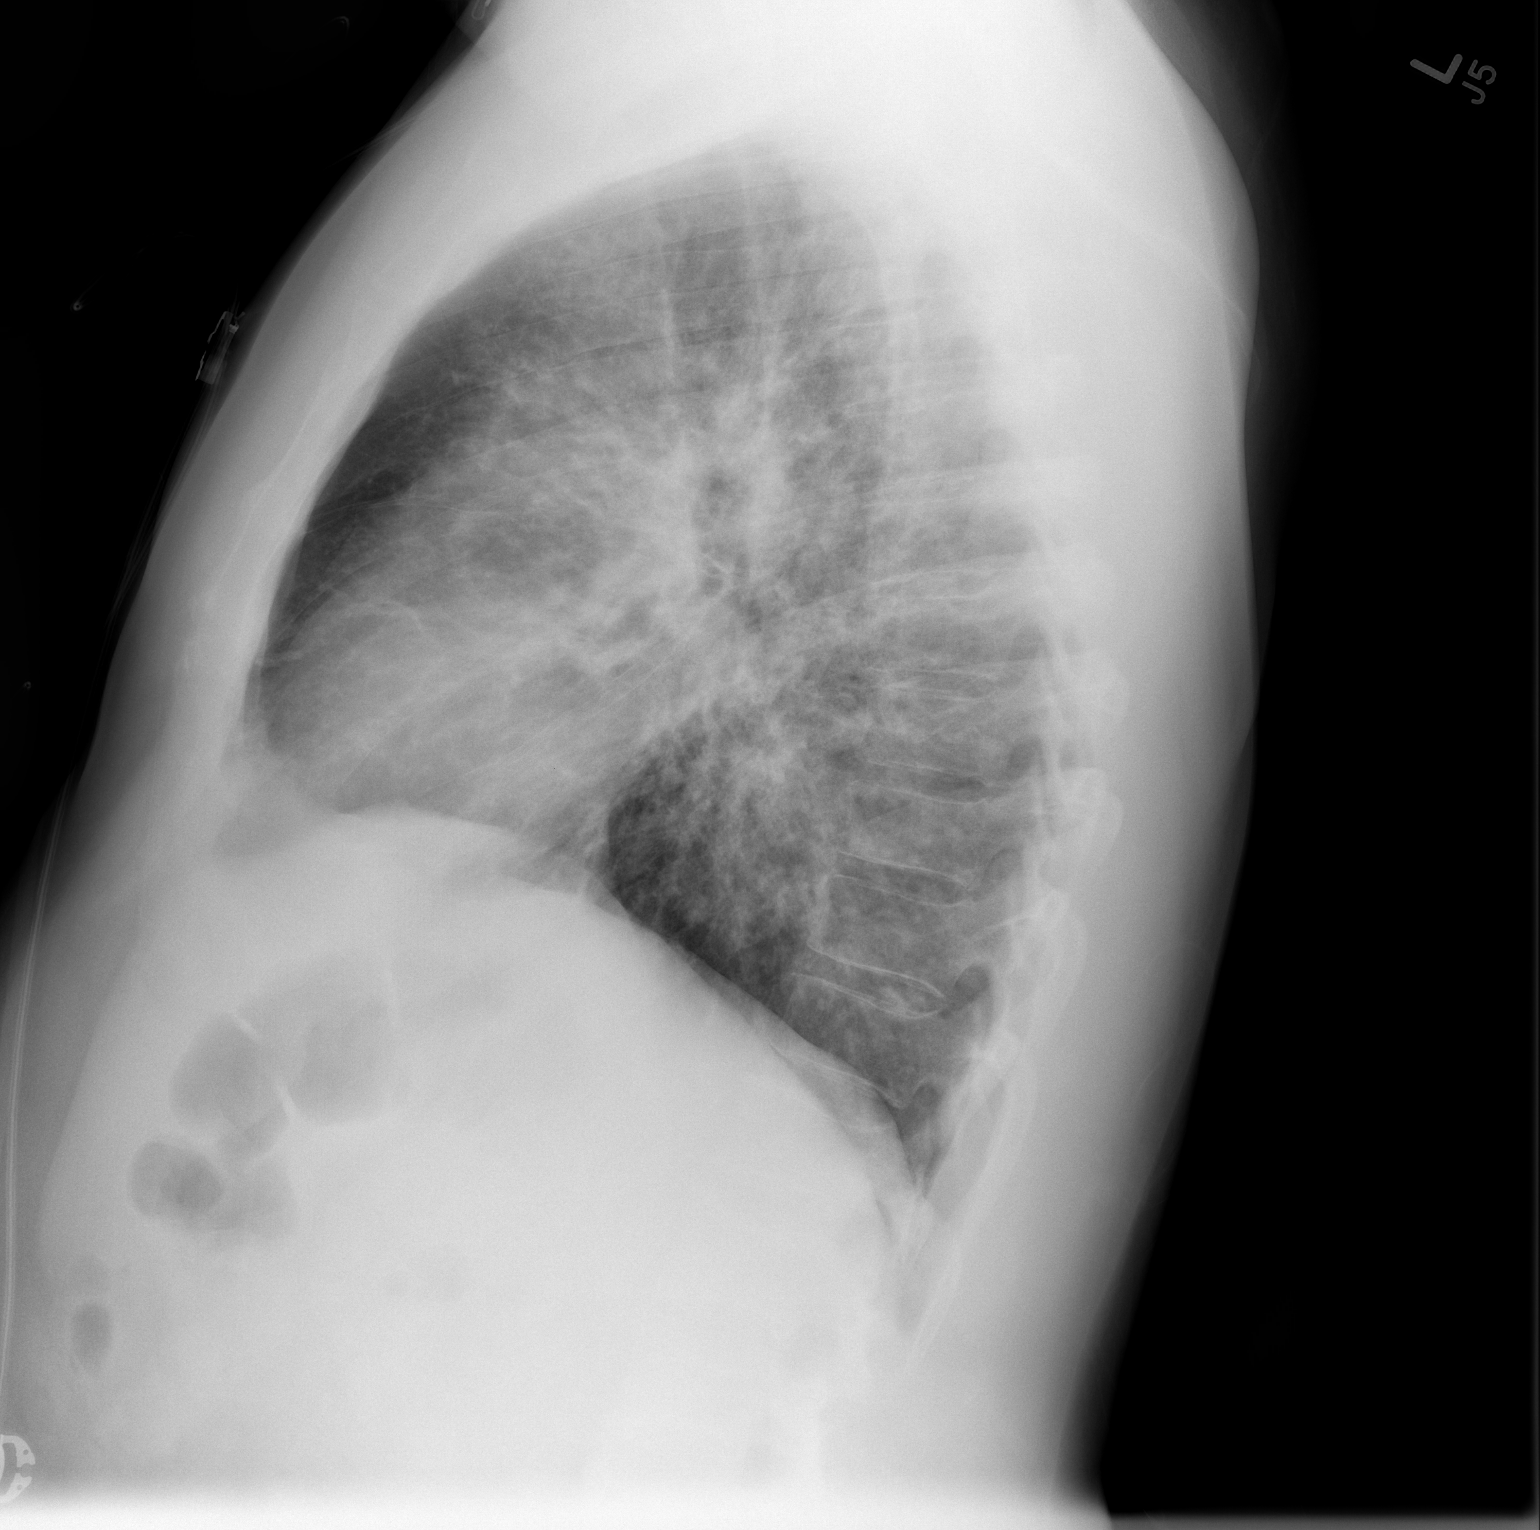

[2 of 2 positions shown; findings below may reference images not displayed]

FINDINGS: Stable cardiac and mediastinal contours. Mild mediastinal adenopathy
again noted. No significant interval change in the pattern of
diffuse bilateral predominantly interstitial airspace opacities with
scattered reticulonodular opacities. No acute osseous abnormality.
No new focal airspace consolidation. No pleural effusion or
pneumothorax.
IMPRESSION: No active cardiopulmonary disease.

Stable appearance of the chest. The chronic mediastinal and
pulmonary parenchymal findings can be consistent with the clinical
history of sarcoidosis.

## 2013-04-07 MED ORDER — SODIUM CHLORIDE 0.9 % IV BOLUS (SEPSIS)
1000.0000 mL | Freq: Once | INTRAVENOUS | Status: AC
  Administered 2013-04-07: 1000 mL via INTRAVENOUS

## 2013-04-07 MED ORDER — KETOROLAC TROMETHAMINE 30 MG/ML IJ SOLN
30.0000 mg | Freq: Once | INTRAMUSCULAR | Status: AC
  Administered 2013-04-07: 30 mg via INTRAVENOUS
  Filled 2013-04-07: qty 1

## 2013-04-07 MED ORDER — PREDNISONE 20 MG PO TABS: 40.0000 mg | ORAL_TABLET | Freq: Every day | ORAL | Status: DC

## 2013-04-07 MED ORDER — MOMETASONE FUROATE 50 MCG/ACT NA SUSP: 2.0000 | Freq: Every day | NASAL | Status: DC

## 2013-04-07 NOTE — ED Notes (Addendum)
Patient states that last night he began having pressure behind his eyes and then in his ears. Later he began having N/V/D, last episode appx an hour ago. Also c/o some chest pressure that started yesterday after he began feeling sick.

## 2013-04-07 NOTE — Discharge Instructions (Signed)
Bronchitis Bronchitis is inflammation of the airways that extend from the windpipe into the lungs (bronchi). The inflammation often causes mucus to develop, which leads to a cough. If the inflammation becomes severe, it may cause shortness of breath. CAUSES  Bronchitis may be caused by:   Viral infections.   Bacteria.   Cigarette smoke.   Allergens, pollutants, and other irritants.  SIGNS AND SYMPTOMS  The most common symptom of bronchitis is a frequent cough that produces mucus. Other symptoms include:  Fever.   Body aches.   Chest congestion.   Chills.   Shortness of breath.   Sore throat.  DIAGNOSIS  Bronchitis is usually diagnosed through a medical history and physical exam. Tests, such as chest X-rays, are sometimes done to rule out other conditions.  TREATMENT  You may need to avoid contact with whatever caused the problem (smoking, for example). Medicines are sometimes needed. These may include:  Antibiotics. These may be prescribed if the condition is caused by bacteria.  Cough suppressants. These may be prescribed for relief of cough symptoms.   Inhaled medicines. These may be prescribed to help open your airways and make it easier for you to breathe.   Steroid medicines. These may be prescribed for those with recurrent (chronic) bronchitis. HOME CARE INSTRUCTIONS  Get plenty of rest.   Drink enough fluids to keep your urine clear or pale yellow (unless you have a medical condition that requires fluid restriction). Increasing fluids may help thin your secretions and will prevent dehydration.   Only take over-the-counter or prescription medicines as directed by your health care provider.  Only take antibiotics as directed. Make sure you finish them even if you start to feel better.  Avoid secondhand smoke, irritating chemicals, and strong fumes. These will make bronchitis worse. If you are a smoker, quit smoking. Consider using nicotine gum or  skin patches to help control withdrawal symptoms. Quitting smoking will help your lungs heal faster.   Put a cool-mist humidifier in your bedroom at night to moisten the air. This may help loosen mucus. Change the water in the humidifier daily. You can also run the hot water in your shower and sit in the bathroom with the door closed for 5 10 minutes.   Follow up with your health care provider as directed.   Wash your hands frequently to avoid catching bronchitis again or spreading an infection to others.  SEEK MEDICAL CARE IF: Your symptoms do not improve after 1 week of treatment.  SEEK IMMEDIATE MEDICAL CARE IF:  Your fever increases.  You have chills.   You have chest pain.   You have worsening shortness of breath.   You have bloody sputum.  You faint.  You have lightheadedness.  You have a severe headache.   You vomit repeatedly. MAKE SURE YOU:   Understand these instructions.  Will watch your condition.  Will get help right away if you are not doing well or get worse. Document Released: 02/11/2005 Document Revised: 12/02/2012 Document Reviewed: 10/06/2012 [facility] Patient Information 2014 [place].  Sarcoidosis, Schaumann's Disease, Sarcoid of [place] Sarcoidosis appears briefly and heals naturally in 68 to 70 percent of cases, often without the patient knowing or doing anything about it. 20 to 30 percent of patients with sarcoidosis are left with some permanent lung damage. In 10 to 15 percent of the patients, sarcoidosis can become chronic (long lasting). When either the granulomas or fibrosis seriously affect the function of a vital organ (lungs, heart, nervous system,  liver, or kidneys), sarcoidosis can be fatal. This occurs 5 to 10 percent of the time. No one can predict how sarcoidosis will progress in an individual patient. The symptoms the patient experiences, the caregiver's findings, and the patient's race can give some clues. Sarcoidosis was  once considered a rare disease. We now know that it is a common chronic illness that appears all over the world. It is the most common of the fibrotic (scarring) lung disorders. Anyone can get sarcoidosis. It occurs in all races and in both sexes. The risk is greater if you are a young black adult, especially a black woman, or are of [place], [place], [place], or [place] origin. In sarcoidosis, small lumps (also called nodules or granulomas) develop in multiple organs of the body. These granulomas are small collections of inflamed cells. They commonly appear in the lungs. This is the most common organ affected. They also occur in the lymph nodes (your glands), skin, liver, and eyes. The granulomas vary in the amount of disease they produce from very little with no problems (symptoms) to causing severe illness. The cause of sarcoidosis is not known. It may be due to an abnormal immune reaction in the body. Most people will recover. A few people will develop long lasting conditions that may get worse. Women are affected more often than men. The majority of those affected are under 64 years of age. Because we do not know the cause, we do not have ways to prevent it. SYMPTOMS   Fever.  Loss of appetite.  Night sweats.  Joint pain.  Aching muscles Symptoms vary because the disease affects different parts of the body in different people. Most people who see their caregiver with sarcoidosis have lung problems. The first signs are usually a dry cough and shortness of breath. There may also be wheezing, chest pain, or a cough that brings up bloody mucus. In severe cases, lung function may become so poor that the person cannot perform even the simple routine tasks of daily life. Other symptoms of sarcoidosis are less common than lung symptoms. They can include:  Skin symptoms. Sarcoidosis can appear as a collection of tender, red bumps called erythema nodosum. These bumps usually occur on the face,  shins, and arms. They can also occur as a scaly, purplish discoloration on the nose, cheeks, and ears. This is called lupus pernio. Less often, sarcoidosis causes cysts, pimples, or disfiguring over growths of skin. In many cases, the disfiguring over growths develop in areas of scars or tattoos.  Eye symptoms. These include redness, eye pain, and sensitivity to light.  Heart symptoms. These include irregular heartbeat and heart failure.  Other symptoms. A person may have paralyzed facial muscles, seizures, psychiatric symptoms, swollen salivary glands, or bone pain. DIAGNOSIS  Even when there are no symptoms, your caregiver can sometimes pick up signs of sarcoidosis during a routine examination, usually through a chest x-ray or when checking other complaints. The patient's age and race or ethnic group can raise an additional red flag that a sign or symptom could be related to sarcoidosis.   Enlargement of the salivary or tear glands and cysts in bone tissue may also be caused by sarcoidosis.  You may have had a biopsy done that shows signs of sarcoidosis. A biopsy is a small tissue sample that is removed for laboratory testing. This tissue sample can be taken from your lung, skin, lip, or another inflamed or abnormal area of the body.  You may have had  an abnormal chest X-ray. Although you appear healthy, a chest X-ray ordered for other reasons may turn up abnormalities that suggest sarcoidosis.  Other tests may be needed. These tests may be done to rule out other illnesses or to determine the amount of organ damage caused by sarcoidosis. Some of the most common tests are:  Blood levels of calcium or angiotensin-converting enzyme may be high in people with sarcoidosis.  Blood tests to evaluate how well your liver is functioning.  Lung function tests to measure how well you are breathing.  A complete eye examination. TREATMENT  If sarcoidosis does not cause any problems, treatment may not  be necessary. Your caregiver may decide to simply monitor your condition. As part of this monitoring process, you may have frequent office visits, follow-up chest X-rays, and tests of your lung function.If you have signs of moderate or severe lung disease, your doctor may recommend:  A corticosteroid drug, such as prednisone (sold under several brand names).  Corticosteroids also are used to treat sarcoidosis of the eyes, joints, skin, nerves, or heart.  Corticosteroid eye drops may be used for the eyes.  Over-the-counter medications like nonsteroidal anti-inflammatory drugs (NSAID) often are used to treat joint pain first before corticosteroids, which tend to have more side effects.  If corticosteroids are not effective or cause serious side effects, other drugs that alter or suppress the immune system may be used.  In rare cases, when sarcoidosis causes life-threatening lung disease, a lung transplant may be necessary. However, there is some risk that the new lungs also will be attacked by sarcoidosis. SEEK IMMEDIATE MEDICAL CARE IF:   You suffer from shortness of breath or a lingering cough.  You develop new problems that may be related to the disease. Remember this disease can affect almost all organs of the body and cause many different problems. Document Released: 12/13/2003 Document Revised: 05/06/2011 Document Reviewed: 05/22/2005 [facility] Patient Information 2014 [place].

## 2013-04-07 NOTE — ED Provider Notes (Signed)
CSN: [id number]     Arrival date & time 04/07/13  1110 History   First MD Initiated Contact with Patient 04/07/13 1126     Chief Complaint  Patient presents with  . Emesis  . Otalgia  . Cough     (Consider location/radiation/quality/duration/timing/severity/associated sxs/prior Treatment) Patient is a 43 y.o. male presenting with vomiting, ear pain, and cough.  Emesis Otalgia Associated symptoms: cough and vomiting   Cough Associated symptoms: ear pain    Pt with history of sarcoidosis reports onset of facial pain/sinus pressure behind his eyes 2 days ago, progressed to bilateral ear pain, productive cough, general malaise, chills but no fever, and occasional diarrhea. He was previously managed at [facility] but has not been in some time. No longer taking any daily prednisone. Continues to smoke.   Past Medical History  Diagnosis Date  . Sarcoidosis   . Bronchitis   . Reflux   . Testicular tumor 2007   Past Surgical History  Procedure Laterality Date  . Arm surgery    . Lung biopsy     No family history on file. History  Substance Use Topics  . Smoking status: Current Every Day Smoker -- 1.00 packs/day for 25 years    Types: Cigarettes  . Smokeless tobacco: Never Used  . Alcohol Use: Yes    Review of Systems  HENT: Positive for ear pain.   Respiratory: Positive for cough.   Gastrointestinal: Positive for vomiting.   All other systems reviewed and are negative except as noted in HPI.     Allergies  Review of patient's allergies indicates no known allergies.  Home Medications   Current Outpatient Rx  Name  Route  Sig  Dispense  Refill  . albuterol (PROVENTIL HFA;VENTOLIN HFA) 108 (90 BASE) MCG/ACT inhaler   Inhalation   Inhale 2 puffs into the lungs every 6 (six) hours as needed. For shortness of breath and wheezing          . oxyCODONE-acetaminophen (PERCOCET/ROXICET) 5-325 MG per tablet   Oral   Take 1-2 tablets by mouth every 6 (six) hours as  needed for pain.   20 tablet   0   . predniSONE (DELTASONE) 10 MG tablet   Oral   Take 4 tablets (40 mg total) by mouth daily.   60 tablet   0    BP 138/102  Pulse 127  Temp(Src) 98.5 F (36.9 C) (Oral)  Resp 20  SpO2 97% Physical Exam  Nursing note and vitals reviewed. Constitutional: He is oriented to person, place, and time. He appears well-developed and well-nourished.  HENT:  Head: Normocephalic and atraumatic.  Right Ear: Tympanic membrane normal.  Left Ear: Tympanic membrane normal.  Facial pain to percussion  Eyes: EOM are normal. Pupils are equal, round, and reactive to light.  Neck: Normal range of motion. Neck supple.  Cardiovascular: Normal heart sounds and intact distal pulses.  Tachycardia present.   Pulmonary/Chest: Effort normal and breath sounds normal. He has no wheezes. He has no rales.  Abdominal: Bowel sounds are normal. He exhibits no distension. There is no tenderness. There is no rebound.  Musculoskeletal: Normal range of motion. He exhibits no edema and no tenderness.  Neurological: He is alert and oriented to person, place, and time. He has normal strength. No cranial nerve deficit or sensory deficit.  Skin: Skin is warm and dry. No rash noted.  Psychiatric: He has a normal mood and affect.    ED Course  Procedures (including critical  care time) Labs Review Labs Reviewed  CBC WITH DIFFERENTIAL  BASIC METABOLIC PANEL   Imaging Review No results found.  EKG Interpretation    Date/Time:  Wednesday April 07 2013 11:32:12 EST Ventricular Rate:  116 PR Interval:  118 QRS Duration: 84 QT Interval:  316 QTC Calculation: 439 R Axis:   72 Text Interpretation:  Sinus tachycardia Nonspecific T wave abnormality Abnormal ECG No significant change since last tracing Confirmed by Pearley Baranek  MD, Amauri Medellin ([id number]) on 04/07/2013 11:42:11 AM            MDM   Final diagnoses:  Viral URI  Bronchitis  Sarcoidosis    Labs and imaging results  reviewed and unremarkable. Pt with viral URI symptoms in setting of sarcoidosis. Will give Nasonex, oral prednisone burst, OTC decongestants and recommend followup with Pulm.     Nicolaas Savo B. Karle Starch, MD 04/07/13 1356

## 2014-07-26 ENCOUNTER — Encounter (HOSPITAL_BASED_OUTPATIENT_CLINIC_OR_DEPARTMENT_OTHER): Payer: Self-pay | Admitting: Emergency Medicine

## 2014-07-26 ENCOUNTER — Emergency Department (HOSPITAL_BASED_OUTPATIENT_CLINIC_OR_DEPARTMENT_OTHER)
Admission: EM | Admit: 2014-07-26 | Discharge: 2014-07-26 | Disposition: A | Discharge status: Home / Self Care | Payer: Self-pay | Attending: Emergency Medicine | Admitting: Emergency Medicine

## 2014-07-26 ENCOUNTER — Emergency Department (HOSPITAL_BASED_OUTPATIENT_CLINIC_OR_DEPARTMENT_OTHER): Payer: Self-pay

## 2014-07-26 DIAGNOSIS — F419 Anxiety disorder, unspecified: Secondary | ICD-10-CM | POA: Insufficient documentation

## 2014-07-26 DIAGNOSIS — Z8719 Personal history of other diseases of the digestive system: Secondary | ICD-10-CM | POA: Insufficient documentation

## 2014-07-26 DIAGNOSIS — Z7951 Long term (current) use of inhaled steroids: Secondary | ICD-10-CM | POA: Insufficient documentation

## 2014-07-26 DIAGNOSIS — Z79899 Other long term (current) drug therapy: Secondary | ICD-10-CM | POA: Insufficient documentation

## 2014-07-26 DIAGNOSIS — Z86018 Personal history of other benign neoplasm: Secondary | ICD-10-CM | POA: Insufficient documentation

## 2014-07-26 DIAGNOSIS — Z8709 Personal history of other diseases of the respiratory system: Secondary | ICD-10-CM | POA: Insufficient documentation

## 2014-07-26 DIAGNOSIS — Z7952 Long term (current) use of systemic steroids: Secondary | ICD-10-CM | POA: Insufficient documentation

## 2014-07-26 DIAGNOSIS — D869 Sarcoidosis, unspecified: Secondary | ICD-10-CM | POA: Insufficient documentation

## 2014-07-26 DIAGNOSIS — R079 Chest pain, unspecified: Secondary | ICD-10-CM | POA: Insufficient documentation

## 2014-07-26 DIAGNOSIS — Z720 Tobacco use: Secondary | ICD-10-CM | POA: Insufficient documentation

## 2014-07-26 LAB — CBC WITH DIFFERENTIAL/PLATELET
BASOS PCT: 2 % — AB (ref 0–1)
Basophils Absolute: 0.1 10*3/uL (ref 0.0–0.1)
EOS ABS: 0 10*3/uL (ref 0.0–0.7)
EOS PCT: 0 % (ref 0–5)
HCT: 49.8 % (ref 39.0–52.0)
Hemoglobin: 16.7 g/dL (ref 13.0–17.0)
LYMPHS ABS: 0.9 10*3/uL (ref 0.7–4.0)
LYMPHS PCT: 17 % (ref 12–46)
MCH: 29.1 pg (ref 26.0–34.0)
MCHC: 33.5 g/dL (ref 30.0–36.0)
MCV: 86.8 fL (ref 78.0–100.0)
Monocytes Absolute: 0.5 10*3/uL (ref 0.1–1.0)
Monocytes Relative: 9 % (ref 3–12)
NEUTROS PCT: 72 % (ref 43–77)
Neutro Abs: 3.8 10*3/uL (ref 1.7–7.7)
PLATELETS: 250 10*3/uL (ref 150–400)
RBC: 5.74 MIL/uL (ref 4.22–5.81)
RDW: 14.9 % (ref 11.5–15.5)
WBC: 5.3 10*3/uL (ref 4.0–10.5)

## 2014-07-26 LAB — COMPREHENSIVE METABOLIC PANEL
ALBUMIN: 3.4 g/dL — AB (ref 3.5–5.0)
ALK PHOS: 155 U/L — AB (ref 38–126)
ALT: 58 U/L (ref 17–63)
ANION GAP: 12 (ref 5–15)
AST: 86 U/L — ABNORMAL HIGH (ref 15–41)
BILIRUBIN TOTAL: 0.8 mg/dL (ref 0.3–1.2)
BUN: 5 mg/dL — AB (ref 6–20)
CHLORIDE: 101 mmol/L (ref 101–111)
CO2: 24 mmol/L (ref 22–32)
CREATININE: 0.94 mg/dL (ref 0.61–1.24)
Calcium: 8.8 mg/dL — ABNORMAL LOW (ref 8.9–10.3)
GFR calc non Af Amer: >60 mL/min (ref >60)
GLUCOSE: 155 mg/dL — AB (ref 65–99)
POTASSIUM: 3.3 mmol/L — AB (ref 3.5–5.1)
Sodium: 137 mmol/L (ref 135–145)
TOTAL PROTEIN: 8.4 g/dL — AB (ref 6.5–8.1)

## 2014-07-26 LAB — LIPASE, BLOOD: LIPASE: 31 U/L (ref 22–51)

## 2014-07-26 LAB — TROPONIN I

## 2014-07-26 IMAGING — DX DG CHEST 2V
2 series · 2 of 2 positions shown · non-contrast
Comparison: [DATE]

CLINICAL DATA: Chest pain, shortness of breath, sudden onset
abnormal talking with family, fatigue, smoker, history sarcoidosis

EXAM:
CHEST  2 VIEW

[chest pa]
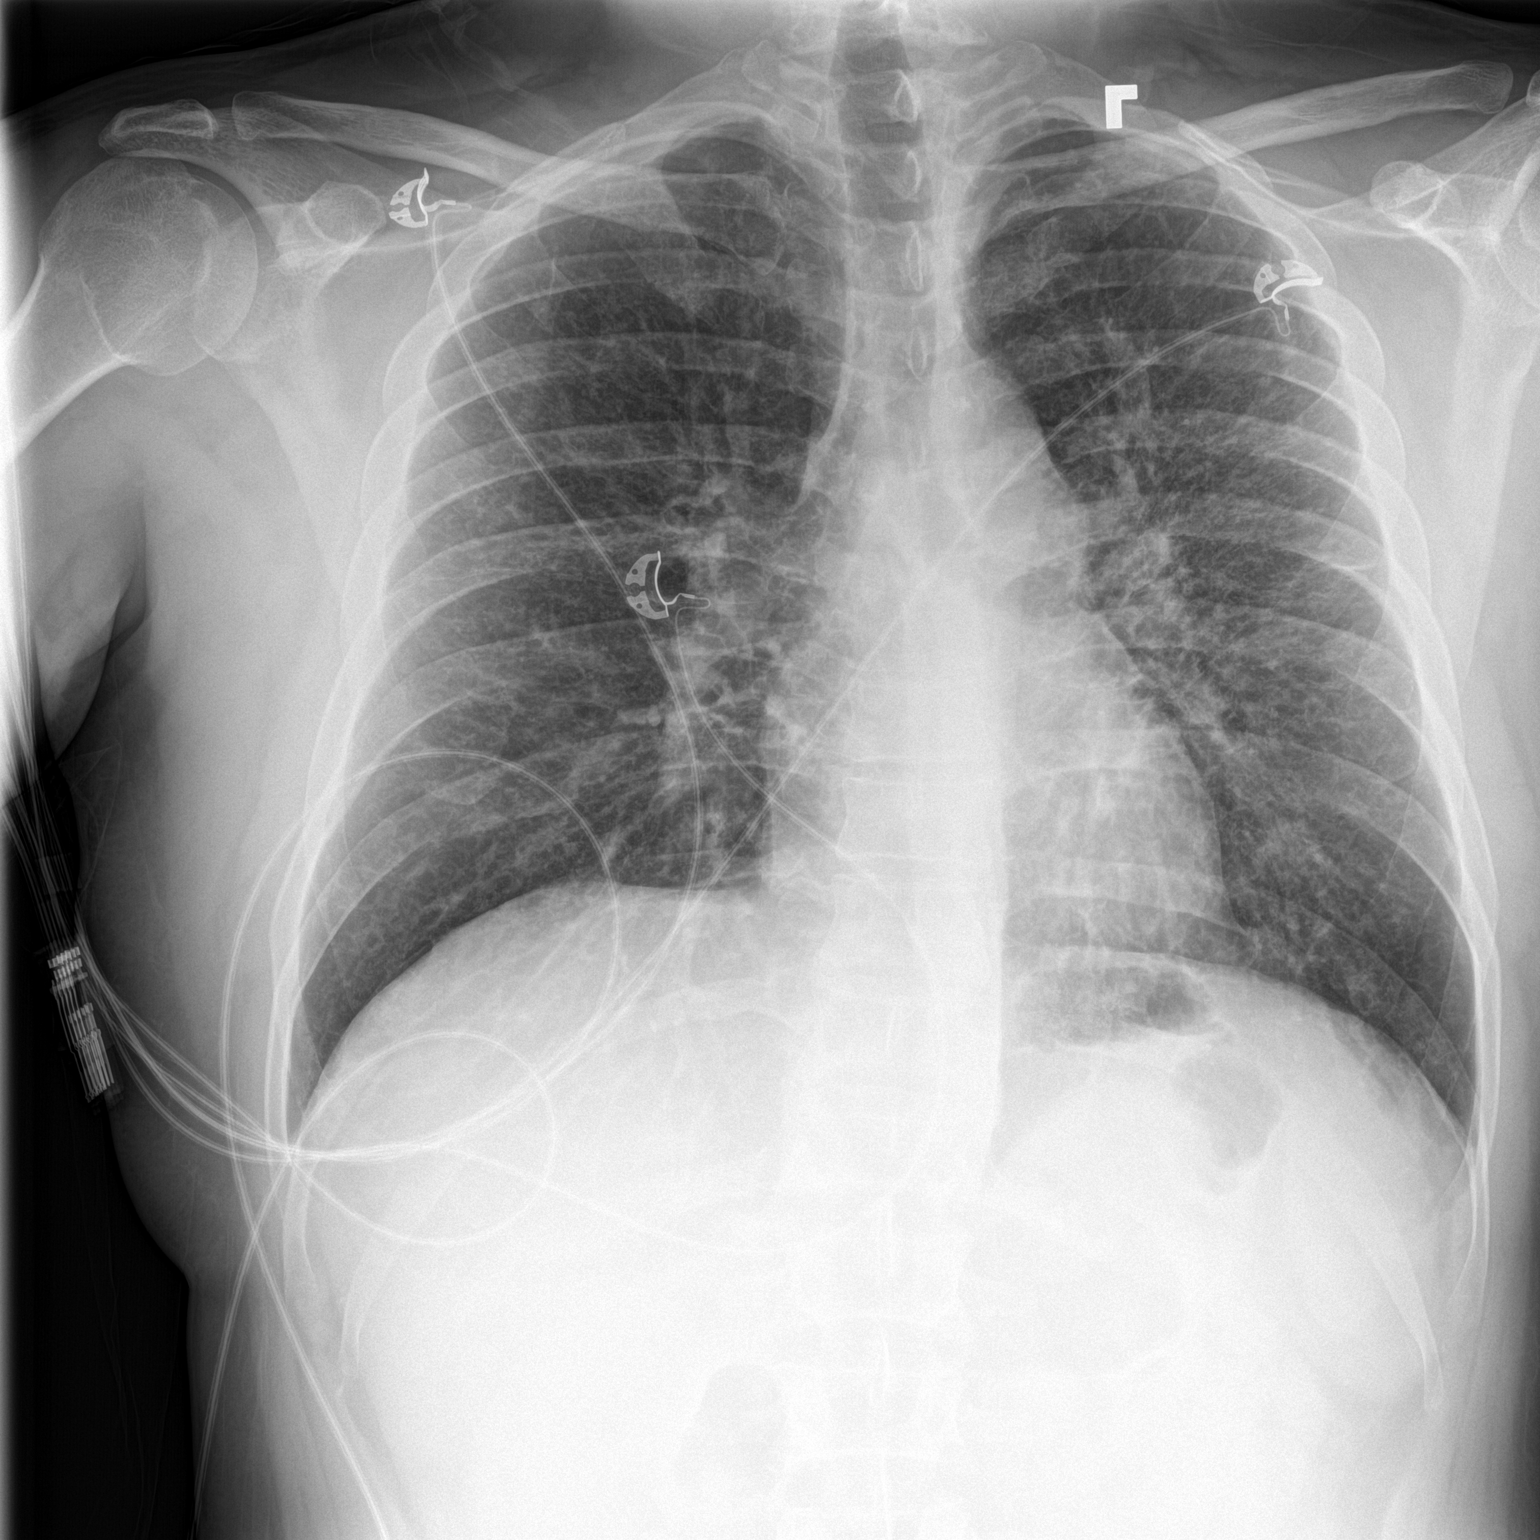

[chest lat]
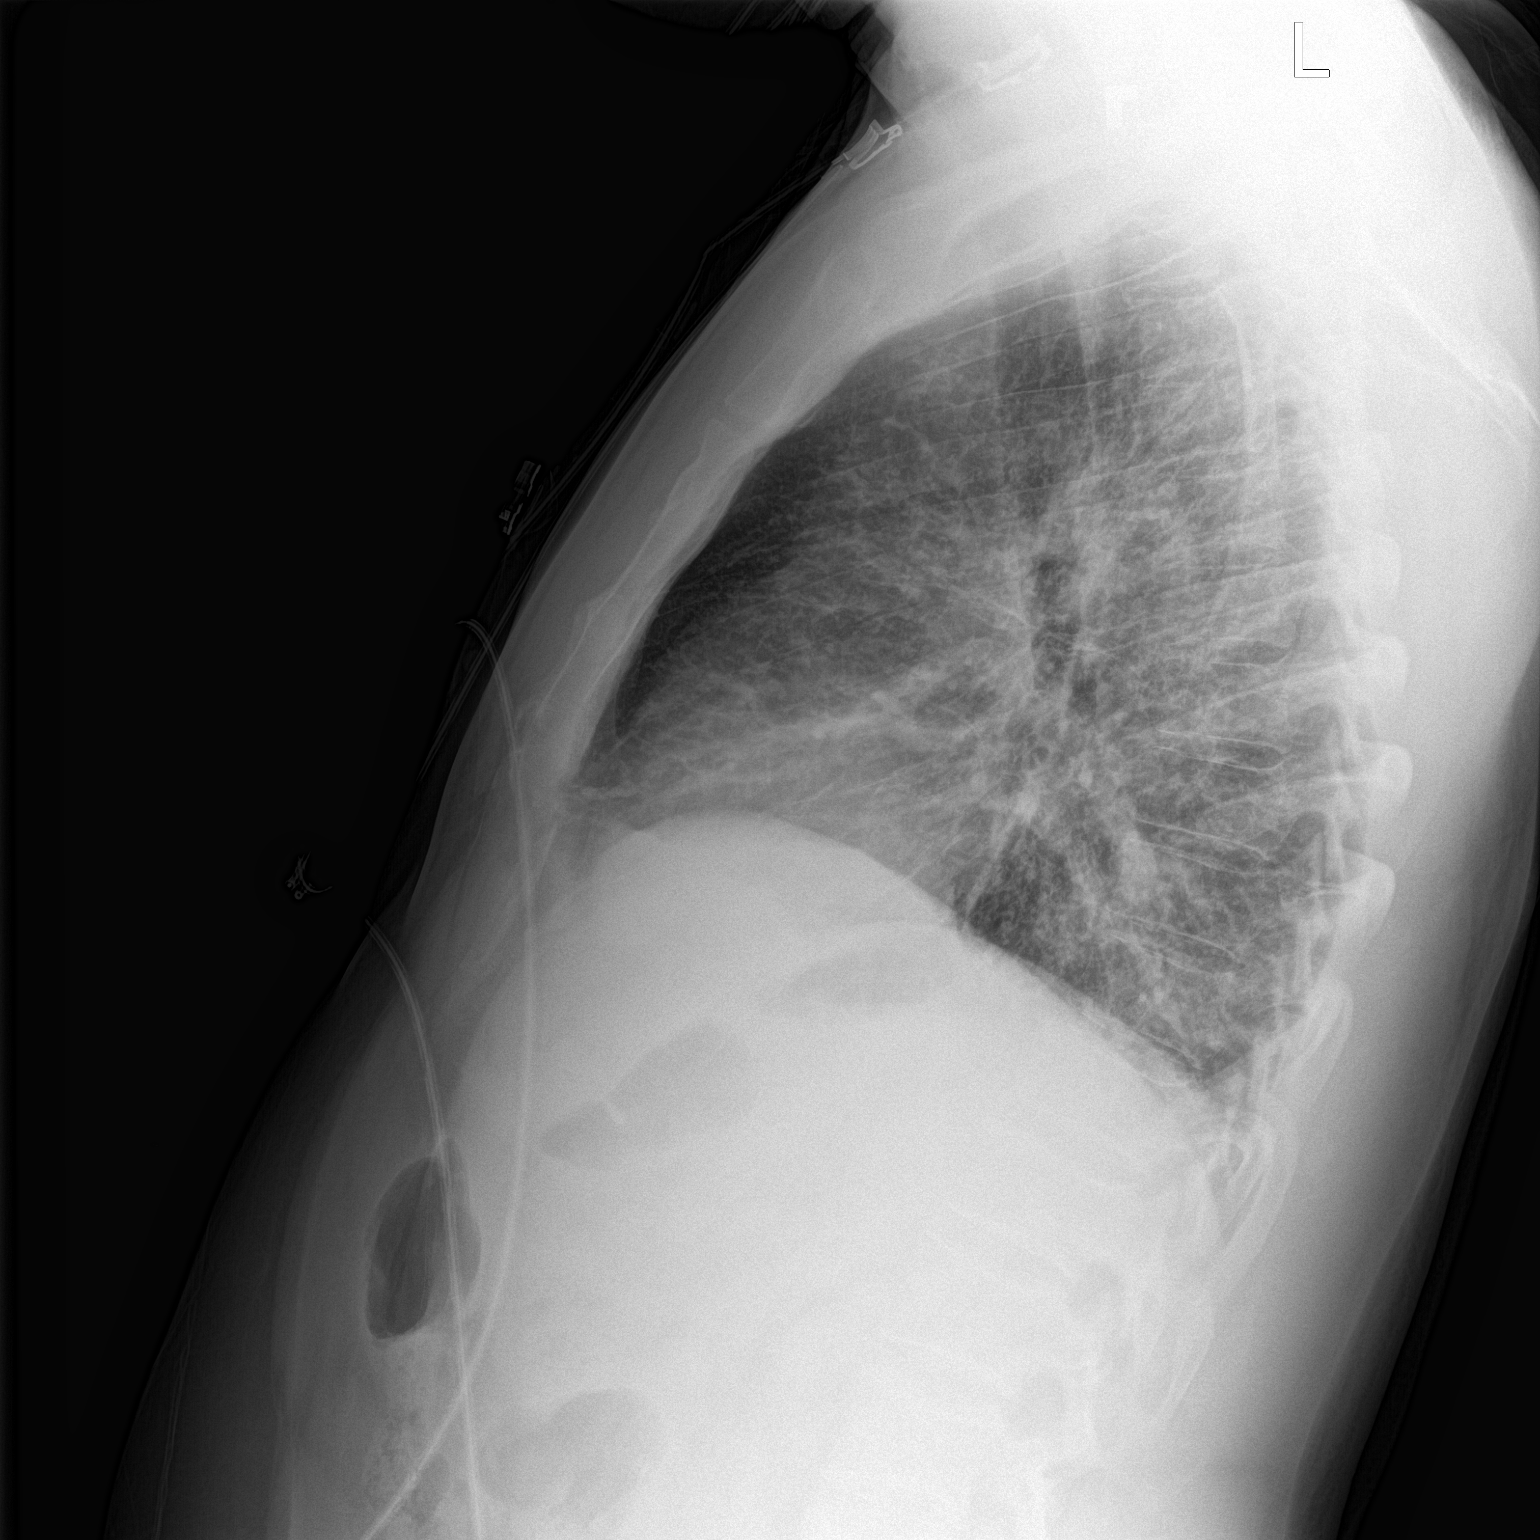

[2 of 2 positions shown; findings below may reference images not displayed]

FINDINGS: Normal heart size and pulmonary vascularity.

Fullness of AP window question related adenopathy, unchanged.

Chronic bronchitic interstitial changes in both lungs similar to
previous exam, slightly greater in LEFT perihilar region, likely
related to chronic interstitial disease and sarcoidosis.

No acute infiltrate, pleural effusion or pneumothorax.

Bones unremarkable.
IMPRESSION: Chronic lung changes and probable AP window adenopathy likely
related to sarcoidosis, stable versus previous exam.

No acute abnormalities.

## 2014-07-26 MED ORDER — IBUPROFEN 400 MG PO TABS
600.0000 mg | ORAL_TABLET | Freq: Once | ORAL | Status: AC
  Administered 2014-07-26: 600 mg via ORAL
  Filled 2014-07-26 (×2): qty 1

## 2014-07-26 NOTE — ED Notes (Signed)
Pt in c/o shooting chest pain starting after he got home from work, states he has been drinking alcohol this weekend with a decreased appetite. States hx of sarcoidosis. NAD in triage.

## 2014-07-26 NOTE — ED Provider Notes (Signed)
CSN: [id number]     Arrival date & time 07/26/14  1614 History  This chart was scribed for Leonard Schwartz, MD by Irene Pap, ED Scribe. This patient was seen in room MH09/MH09 and patient care was started at 6:08 PM.   Chief Complaint  Patient presents with  . Chest Pain   The history is provided by the patient. No language interpreter was used.   HPI Comments: Trevor Reilly is a 44 y.o. male who presents to the Emergency Department complaining of shooting chest pain onset earlier today. He states that he was at work starting his new job when a co-worker made him angry. He states that the symptoms started after that; attributes his symptoms due to getting angry. He reports that he was also trembling, something he attributes to his anger. He states that he drank alcohol last night and did not get any sleep before work. He reports history of sarcoidosis but has not taken his prednisone in 3 years; he reports that he has also not seen his doctor in this amount of time. He states that he smokes 2 packs a day. He denies any other symptoms or significant medical problems.   Past Medical History  Diagnosis Date  . Sarcoidosis   . Bronchitis   . Reflux   . Testicular tumor 2007   Past Surgical History  Procedure Laterality Date  . Arm surgery    . Lung biopsy     History reviewed. No pertinent family history. History  Substance Use Topics  . Smoking status: Current Every Day Smoker -- 1.00 packs/day for 25 years    Types: Cigarettes  . Smokeless tobacco: Never Used  . Alcohol Use: Yes    Review of Systems A complete 10 system review of systems was obtained and all systems are negative except as noted in the HPI and PMH.   Allergies  Review of patient's allergies indicates no known allergies.  Home Medications   Prior to Admission medications   Medication Sig Start Date End Date Taking? Authorizing Provider  albuterol (PROVENTIL HFA;VENTOLIN HFA) 108 (90 BASE) MCG/ACT inhaler  Inhale 2 puffs into the lungs every 6 (six) hours as needed. For shortness of breath and wheezing     Historical Provider, MD  mometasone (NASONEX) 50 MCG/ACT nasal spray Place 2 sprays into the nose daily. 04/07/13   Calvert Cantor, MD  oxyCODONE-acetaminophen (PERCOCET/ROXICET) 5-325 MG per tablet Take 1-2 tablets by mouth every 6 (six) hours as needed for pain. 09/19/12   Calvert Cantor, MD  predniSONE (DELTASONE) 10 MG tablet Take 4 tablets (40 mg total) by mouth daily. 09/19/12   Calvert Cantor, MD  predniSONE (DELTASONE) 20 MG tablet Take 2 tablets (40 mg total) by mouth daily. 04/07/13   Calvert Cantor, MD   BP 157/103 mmHg  Pulse 118  Temp(Src) 98.8 F (37.1 C) (Oral)  Resp 18  Ht 5\' 9"  (1.753 m)  Wt 189 lb (85.73 kg)  BMI 27.90 kg/m2  SpO2 98% Physical Exam  Constitutional: He is oriented to person, place, and time. He appears well-developed and well-nourished. No distress.  HENT:  Head: Normocephalic and atraumatic.  Eyes: Pupils are equal, round, and reactive to light.  Neck: Normal range of motion.  Cardiovascular: Normal rate and intact distal pulses.   Pulmonary/Chest: No respiratory distress.  Abdominal: Normal appearance. He exhibits no distension. There is no tenderness. There is no rebound.  Musculoskeletal: Normal range of motion.  Neurological: He is alert and oriented to person, place,  and time. No cranial nerve deficit.  Skin: Skin is warm and dry. No rash noted.  Psychiatric: He has a normal mood and affect. His behavior is normal.  Nursing note and vitals reviewed.   ED Course  Procedures (including critical care time) DIAGNOSTIC STUDIES: Oxygen Saturation is 98% on room air, normal by my interpretation.    COORDINATION OF CARE: 6:10 PM-Discussed treatment plan which includes discussion of lab, EKG, and X-ray results and follow up with PCP or wellness center with pt at bedside and pt agreed to plan.   Labs Review Labs Reviewed  COMPREHENSIVE METABOLIC  PANEL - Abnormal; Notable for the following:    Potassium 3.3 (*)    Glucose, Bld 155 (*)    BUN 5 (*)    Calcium 8.8 (*)    Total Protein 8.4 (*)    Albumin 3.4 (*)    AST 86 (*)    Alkaline Phosphatase 155 (*)    All other components within normal limits  CBC WITH DIFFERENTIAL/PLATELET - Abnormal; Notable for the following:    Basophils Relative 2 (*)    All other components within normal limits  LIPASE, BLOOD  TROPONIN I    Imaging Review No results found.   EKG Interpretation   Date/Time:  Tuesday Jul 26 2014 16:24:58 EDT Ventricular Rate:  110 PR Interval:  116 QRS Duration: 88 QT Interval:  328 QTC Calculation: 443 R Axis:   60 Text Interpretation:  Sinus tachycardia Nonspecific T wave abnormality  Abnormal ECG No significant change since last tracing Confirmed by Sweeny ([id number]) on 07/26/2014 4:28:15 PM     After treatment in the ED the patient feels back to baseline and wants to go home.Strongly encouraged to f/u with primary care physician.  Appears stable for discharge MDM   Final diagnoses:  Chest pain  Sarcoid  Anxiety    I personally performed the services described in this documentation, which was scribed in my presence. The recorded information has been reviewed and considered.   Leonard Schwartz, MD 08/03/14 1341

## 2014-07-26 NOTE — Discharge Instructions (Signed)
Chest Pain (Nonspecific) It is often hard to give a diagnosis for the cause of chest pain. There is always a chance that your pain could be related to something serious, such as a heart attack or a blood clot in the lungs. You need to follow up with your doctor. HOME CARE  If antibiotic medicine was given, take it as directed by your doctor. Finish the medicine even if you start to feel better.  For the next few days, avoid activities that bring on chest pain. Continue physical activities as told by your doctor.  Do not use any tobacco products. This includes cigarettes, chewing tobacco, and e-cigarettes.  Avoid drinking alcohol.  Only take medicine as told by your doctor.  Follow your doctor's suggestions for more testing if your chest pain does not go away.  Keep all doctor visits you made. GET HELP IF:  Your chest pain does not go away, even after treatment.  You have a rash with blisters on your chest.  You have a fever. GET HELP RIGHT AWAY IF:   You have more pain or pain that spreads to your arm, neck, jaw, back, or belly (abdomen).  You have shortness of breath.  You cough more than usual or cough up blood.  You have very bad back or belly pain.  You feel sick to your stomach (nauseous) or throw up (vomit).  You have very bad weakness.  You pass out (faint).  You have chills. This is an emergency. Do not wait to see if the problems will go away. Call your local emergency services (911 in [place] Do not drive yourself to the hospital. MAKE SURE YOU:   Understand these instructions.  Will watch your condition.  Will get help right away if you are not doing well or get worse. Document Released: 07/31/2007 Document Revised: 02/16/2013 Document Reviewed: 07/31/2007 [facility] Patient Information 2015 [place], [place]. This information is not intended to replace advice given to you by your health care provider. Make sure you discuss any questions you have with your  health care provider.  Panic Attacks Panic attacks are sudden, short-livedsurges of severe anxiety, fear, or discomfort. They may occur for no reason when you are relaxed, when you are anxious, or when you are sleeping. Panic attacks may occur for a number of reasons:   Healthy people occasionally have panic attacks in extreme, life-threatening situations, such as war or natural disasters. Normal anxiety is a protective mechanism of the body that helps [place] react to danger (fight or flight response).  Panic attacks are often seen with anxiety disorders, such as panic disorder, social anxiety disorder, generalized anxiety disorder, and phobias. Anxiety disorders cause excessive or uncontrollable anxiety. They may interfere with your relationships or other life activities.  Panic attacks are sometimes seen with other mental illnesses, such as depression and posttraumatic stress disorder.  Certain medical conditions, prescription medicines, and drugs of abuse can cause panic attacks. SYMPTOMS  Panic attacks start suddenly, peak within 20 minutes, and are accompanied by four or more of the following symptoms:  Pounding heart or fast heart rate (palpitations).  Sweating.  Trembling or shaking.  Shortness of breath or feeling smothered.  Feeling choked.  Chest pain or discomfort.  Nausea or strange feeling in your stomach.  Dizziness, light-headedness, or feeling like you will faint.  Chills or hot flushes.  Numbness or tingling in your lips or hands and feet.  Feeling that things are not real or feeling that you are not yourself.  Fear of losing control or going crazy.  Fear of dying. Some of these symptoms can mimic serious medical conditions. For example, you may think you are having a heart attack. Although panic attacks can be very scary, they are not life threatening. DIAGNOSIS  Panic attacks are diagnosed through an assessment by your health care provider. Your health care  provider will ask questions about your symptoms, such as where and when they occurred. Your health care provider will also ask about your medical history and use of alcohol and drugs, including prescription medicines. Your health care provider may order blood tests or other studies to rule out a serious medical condition. Your health care provider may refer you to a mental health professional for further evaluation. TREATMENT   Most healthy people who have one or two panic attacks in an extreme, life-threatening situation will not require treatment.  The treatment for panic attacks associated with anxiety disorders or other mental illness typically involves counseling with a mental health professional, medicine, or a combination of both. Your health care provider will help determine what treatment is best for you.  Panic attacks due to physical illness usually go away with treatment of the illness. If prescription medicine is causing panic attacks, talk with your health care provider about stopping the medicine, decreasing the dose, or substituting another medicine.  Panic attacks due to alcohol or drug abuse go away with abstinence. Some adults need professional help in order to stop drinking or using drugs. HOME CARE INSTRUCTIONS   Take all medicines as directed by your health care provider.   Schedule and attend follow-up visits as directed by your health care provider. It is important to keep all your appointments. SEEK MEDICAL CARE IF:  You are not able to take your medicines as prescribed.  Your symptoms do not improve or get worse. SEEK IMMEDIATE MEDICAL CARE IF:   You experience panic attack symptoms that are different than your usual symptoms.  You have serious thoughts about hurting yourself or others.  You are taking medicine for panic attacks and have a serious side effect. MAKE SURE YOU:  Understand these instructions.  Will watch your condition.  Will get help right  away if you are not doing well or get worse. Document Released: 02/11/2005 Document Revised: 02/16/2013 Document Reviewed: 09/25/2012 [facility] Patient Information 2015 [place], [place]. This information is not intended to replace advice given to you by your health care provider. Make sure you discuss any questions you have with your health care provider.  Sarcoidosis, Schaumann's Disease, Sarcoid of [place] Sarcoidosis appears briefly and heals naturally in 50 to 70 percent of cases, often without the patient knowing or doing anything about it. 20 to 30 percent of patients with sarcoidosis are left with some permanent lung damage. In 10 to 15 percent of the patients, sarcoidosis can become chronic (long lasting). When either the granulomas or fibrosis seriously affect the function of a vital organ (lungs, heart, nervous system, liver, or kidneys), sarcoidosis can be fatal. This occurs 5 to 10 percent of the time. No one can predict how sarcoidosis will progress in an individual patient. The symptoms the patient experiences, the caregiver's findings, and the patient's race can give some clues. Sarcoidosis was once considered a rare disease. We now know that it is a common chronic illness that appears all over the world. It is the most common of the fibrotic (scarring) lung disorders. Anyone can get sarcoidosis. It occurs in all races and in both sexes.  The risk is greater if you are a young black adult, especially a black woman, or are of [place], [place], [place], or [place] origin. In sarcoidosis, small lumps (also called nodules or granulomas) develop in multiple organs of the body. These granulomas are small collections of inflamed cells. They commonly appear in the lungs. This is the most common organ affected. They also occur in the lymph nodes (your glands), skin, liver, and eyes. The granulomas vary in the amount of disease they produce from very little with no problems (symptoms) to causing severe  illness. The cause of sarcoidosis is not known. It may be due to an abnormal immune reaction in the body. Most people will recover. A few people will develop long lasting conditions that may get worse. Women are affected more often than men. The majority of those affected are under 76 years of age. Because we do not know the cause, we do not have ways to prevent it. SYMPTOMS   Fever.  Loss of appetite.  Night sweats.  Joint pain.  Aching muscles Symptoms vary because the disease affects different parts of the body in different people. Most people who see their caregiver with sarcoidosis have lung problems. The first signs are usually a dry cough and shortness of breath. There may also be wheezing, chest pain, or a cough that brings up bloody mucus. In severe cases, lung function may become so poor that the person cannot perform even the simple routine tasks of daily life. Other symptoms of sarcoidosis are less common than lung symptoms. They can include:  Skin symptoms. Sarcoidosis can appear as a collection of tender, red bumps called erythema nodosum. These bumps usually occur on the face, shins, and arms. They can also occur as a scaly, purplish discoloration on the nose, cheeks, and ears. This is called lupus pernio. Less often, sarcoidosis causes cysts, pimples, or disfiguring over growths of skin. In many cases, the disfiguring over growths develop in areas of scars or tattoos.  Eye symptoms. These include redness, eye pain, and sensitivity to light.  Heart symptoms. These include irregular heartbeat and heart failure.  Other symptoms. A person may have paralyzed facial muscles, seizures, psychiatric symptoms, swollen salivary glands, or bone pain. DIAGNOSIS  Even when there are no symptoms, your caregiver can sometimes pick up signs of sarcoidosis during a routine examination, usually through a chest x-ray or when checking other complaints. The patient's age and race or ethnic group  can raise an additional red flag that a sign or symptom could be related to sarcoidosis.   Enlargement of the salivary or tear glands and cysts in bone tissue may also be caused by sarcoidosis.  You may have had a biopsy done that shows signs of sarcoidosis. A biopsy is a small tissue sample that is removed for laboratory testing. This tissue sample can be taken from your lung, skin, lip, or another inflamed or abnormal area of the body.  You may have had an abnormal chest X-ray. Although you appear healthy, a chest X-ray ordered for other reasons may turn up abnormalities that suggest sarcoidosis.  Other tests may be needed. These tests may be done to rule out other illnesses or to determine the amount of organ damage caused by sarcoidosis. Some of the most common tests are:  Blood levels of calcium or angiotensin-converting enzyme may be high in people with sarcoidosis.  Blood tests to evaluate how well your liver is functioning.  Lung function tests to measure how well you  are breathing.  A complete eye examination. TREATMENT  If sarcoidosis does not cause any problems, treatment may not be necessary. Your caregiver may decide to simply monitor your condition. As part of this monitoring process, you may have frequent office visits, follow-up chest X-rays, and tests of your lung function.If you have signs of moderate or severe lung disease, your doctor may recommend:  A corticosteroid drug, such as prednisone (sold under several brand names).  Corticosteroids also are used to treat sarcoidosis of the eyes, joints, skin, nerves, or heart.  Corticosteroid eye drops may be used for the eyes.  Over-the-counter medications like nonsteroidal anti-inflammatory drugs (NSAID) often are used to treat joint pain first before corticosteroids, which tend to have more side effects.  If corticosteroids are not effective or cause serious side effects, other drugs that alter or suppress the immune  system may be used.  In rare cases, when sarcoidosis causes life-threatening lung disease, a lung transplant may be necessary. However, there is some risk that the new lungs also will be attacked by sarcoidosis. SEEK IMMEDIATE MEDICAL CARE IF:   You suffer from shortness of breath or a lingering cough.  You develop new problems that may be related to the disease. Remember this disease can affect almost all organs of the body and cause many different problems. Document Released: 12/13/2003 Document Revised: 05/06/2011 Document Reviewed: 06/09/2013 [facility] Patient Information 2015 [place], [place]. This information is not intended to replace advice given to you by your health care provider. Make sure you discuss any questions you have with your health care provider.

## 2015-07-18 DIAGNOSIS — J301 Allergic rhinitis due to pollen: Secondary | ICD-10-CM | POA: Insufficient documentation

## 2015-07-18 DIAGNOSIS — G4733 Obstructive sleep apnea (adult) (pediatric): Secondary | ICD-10-CM | POA: Insufficient documentation

## 2015-07-18 DIAGNOSIS — D860 Sarcoidosis of lung: Secondary | ICD-10-CM | POA: Insufficient documentation

## 2016-02-09 ENCOUNTER — Encounter (HOSPITAL_BASED_OUTPATIENT_CLINIC_OR_DEPARTMENT_OTHER): Payer: Self-pay | Admitting: *Deleted

## 2016-02-09 ENCOUNTER — Emergency Department (HOSPITAL_BASED_OUTPATIENT_CLINIC_OR_DEPARTMENT_OTHER)
Admission: EM | Admit: 2016-02-09 | Discharge: 2016-02-09 | Disposition: A | Discharge status: Home / Self Care | Payer: Self-pay | Attending: Emergency Medicine | Admitting: Emergency Medicine

## 2016-02-09 ENCOUNTER — Emergency Department (HOSPITAL_BASED_OUTPATIENT_CLINIC_OR_DEPARTMENT_OTHER): Payer: Self-pay

## 2016-02-09 DIAGNOSIS — Z8547 Personal history of malignant neoplasm of testis: Secondary | ICD-10-CM | POA: Insufficient documentation

## 2016-02-09 DIAGNOSIS — F17210 Nicotine dependence, cigarettes, uncomplicated: Secondary | ICD-10-CM | POA: Insufficient documentation

## 2016-02-09 DIAGNOSIS — J209 Acute bronchitis, unspecified: Secondary | ICD-10-CM | POA: Insufficient documentation

## 2016-02-09 IMAGING — DX DG CHEST 2V
2 series · 2 of 2 positions shown · non-contrast
Comparison: Chest radiograph dated [DATE]

CLINICAL DATA: 45-year-old male with cough x1 month. Nausea
vomiting. History of sarcoidosis.

EXAM:
CHEST  2 VIEW

[chest pa]
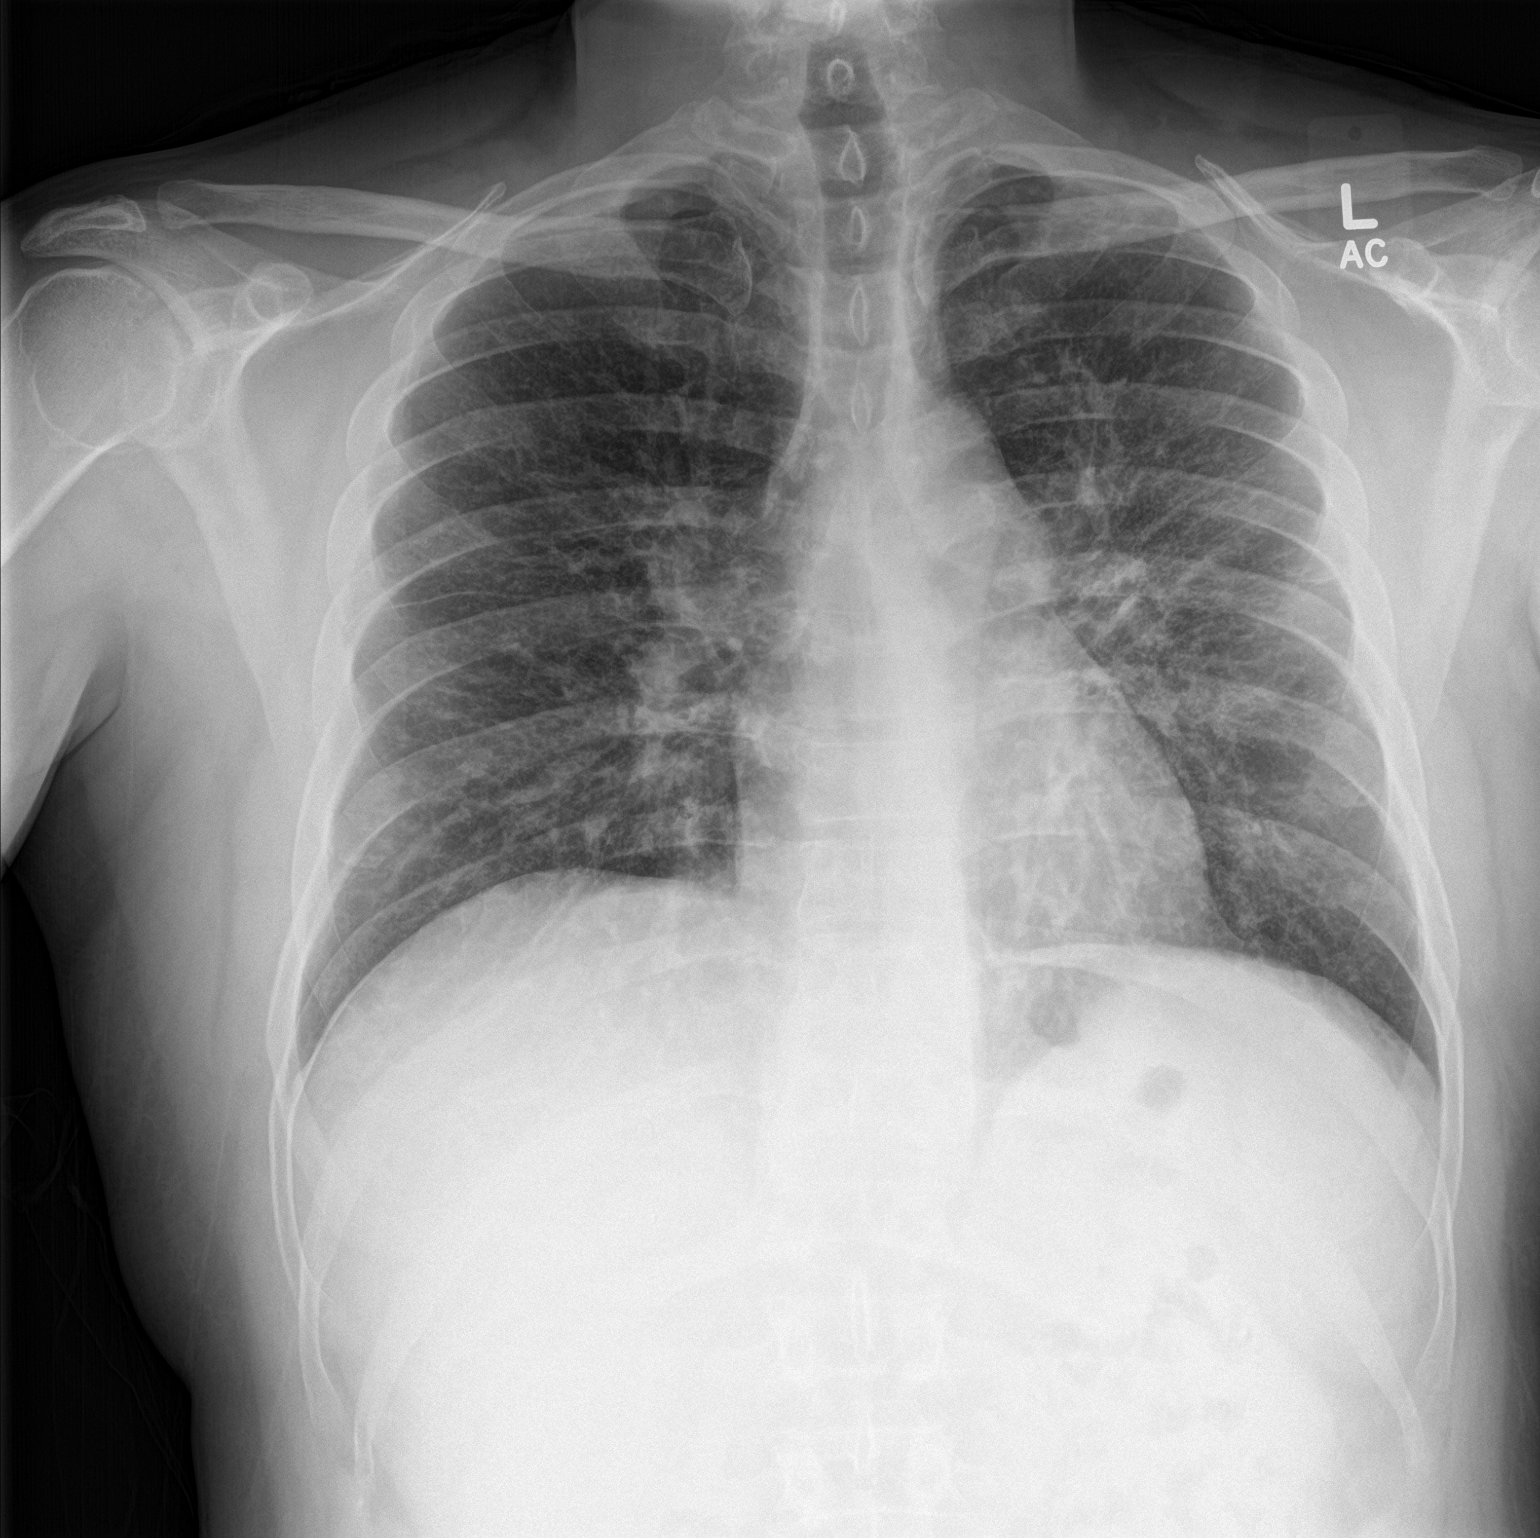

[chest lat]
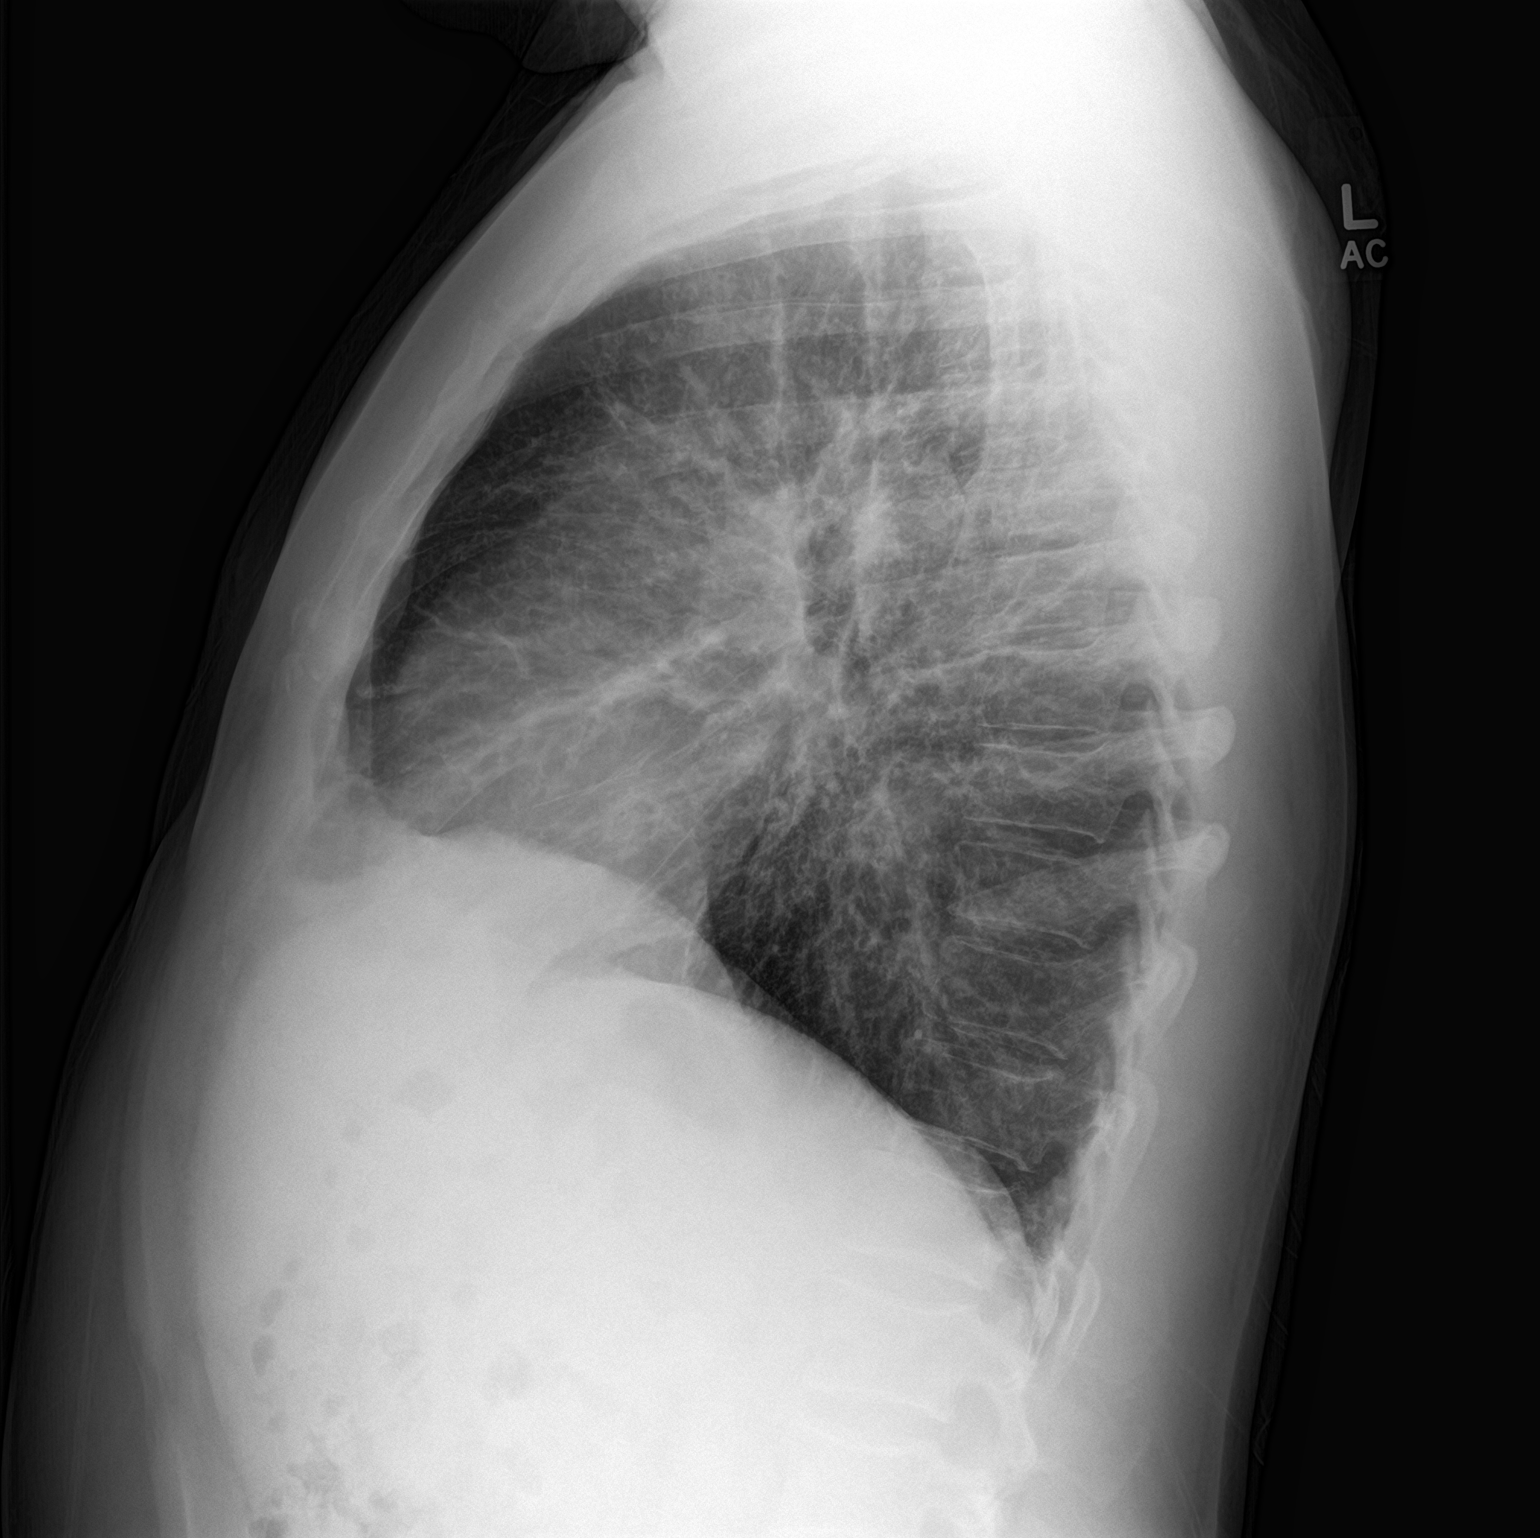

[2 of 2 positions shown; findings below may reference images not displayed]

FINDINGS: There is diffuse chronic appearing interstitial coarsening likely
related to underlying sarcoidosis. There is no focal consolidation,
pleural effusion, or pneumothorax. The cardiac silhouette is within
normal limits. No acute osseous pathology.
IMPRESSION: No active cardiopulmonary disease.

## 2016-02-09 MED ORDER — ALBUTEROL SULFATE HFA 108 (90 BASE) MCG/ACT IN AERS
2.0000 | INHALATION_SPRAY | RESPIRATORY_TRACT | Status: DC
  Administered 2016-02-09: 2 via RESPIRATORY_TRACT
  Filled 2016-02-09: qty 6.7

## 2016-02-09 MED ORDER — AEROCHAMBER PLUS FLO-VU LARGE MISC
1.0000 | Freq: Once | Status: AC
  Administered 2016-02-09: 1
  Filled 2016-02-09: qty 1

## 2016-02-09 MED ORDER — AZITHROMYCIN 250 MG PO TABS: ORAL_TABLET | ORAL | 0 refills | Status: DC

## 2016-02-09 NOTE — ED Provider Notes (Signed)
[facility] DEPT [facility] Provider Note   CSN: [id number] Arrival date & time: 02/09/16  0356     History   Chief Complaint Chief Complaint  Patient presents with  . Cough    HPI Trevor Reilly is a 45 y.o. male.  Patient is a 45 year old male with history of sarcoidosis. He presents for evaluation of a several week history of chest congestion and productive cough. He reports subjective chills but no fever. He denies any bloody stools, but does report some diarrhea. His wife was sick with similar symptoms recently.   The history is provided by the patient.  Cough  This is a new problem. Episode onset: Several weeks ago. The problem occurs constantly. The problem has been gradually worsening. The cough is productive of sputum. There has been no fever. Pertinent negatives include no chest pain and no chills. He has tried nothing for the symptoms. He is a smoker.    Past Medical History:  Diagnosis Date  . Bronchitis   . Reflux   . Sarcoidosis ([place])   . Testicular tumor 2007    There are no active problems to display for this patient.   Past Surgical History:  Procedure Laterality Date  . arm surgery    . LUNG BIOPSY         Home Medications    Prior to Admission medications   Medication Sig Start Date End Date Taking? Authorizing Provider  albuterol (PROVENTIL HFA;VENTOLIN HFA) 108 (90 BASE) MCG/ACT inhaler Inhale 2 puffs into the lungs every 6 (six) hours as needed. For shortness of breath and wheezing     Historical Provider, MD  mometasone (NASONEX) 50 MCG/ACT nasal spray Place 2 sprays into the nose daily. 04/07/13   Calvert Cantor, MD  oxyCODONE-acetaminophen (PERCOCET/ROXICET) 5-325 MG per tablet Take 1-2 tablets by mouth every 6 (six) hours as needed for pain. 09/19/12   Calvert Cantor, MD  predniSONE (DELTASONE) 10 MG tablet Take 4 tablets (40 mg total) by mouth daily. 09/19/12   Calvert Cantor, MD  predniSONE (DELTASONE) 20 MG tablet Take 2 tablets (40 mg  total) by mouth daily. 04/07/13   Calvert Cantor, MD    Family History History reviewed. No pertinent family history.  Social History Social History  Substance Use Topics  . Smoking status: Current Every Day Smoker    Packs/day: 1.00    Years: 25.00    Types: Cigarettes  . Smokeless tobacco: Never Used  . Alcohol use Yes     Allergies   Patient has no known allergies.   Review of Systems Review of Systems  Constitutional: Negative for chills.  Respiratory: Positive for cough.   Cardiovascular: Negative for chest pain.  All other systems reviewed and are negative.    Physical Exam Updated Vital Signs BP 134/100 (BP Location: Right Arm)   Pulse 95   Temp 98.2 F (36.8 C) (Oral)   Resp 18   SpO2 95%   Physical Exam  Constitutional: He is oriented to person, place, and time. He appears well-developed and well-nourished. No distress.  HENT:  Head: Normocephalic and atraumatic.  Mouth/Throat: Oropharynx is clear and moist.  Neck: Normal range of motion. Neck supple.  Cardiovascular: Normal rate and regular rhythm.  Exam reveals no friction rub.   No murmur heard. Pulmonary/Chest: Effort normal and breath sounds normal. No respiratory distress. He has no wheezes. He has no rales.  Abdominal: Soft. Bowel sounds are normal. He exhibits no distension. There is no tenderness.  Musculoskeletal: Normal range  of motion. He exhibits no edema.  Neurological: He is alert and oriented to person, place, and time. Coordination normal.  Skin: Skin is warm and dry. He is not diaphoretic.  Nursing note and vitals reviewed.    ED Treatments / Results  Labs (all labs ordered are listed, but only abnormal results are displayed) Labs Reviewed - No data to display  EKG  EKG Interpretation None       Radiology No results found.  Procedures Procedures (including critical care time)  Medications Ordered in ED Medications - No data to display   Initial Impression /  Assessment and Plan / ED Course  I have reviewed the triage vital signs and the nursing notes.  Pertinent labs & imaging results that were available during my care of the patient were reviewed by me and considered in my medical decision making (see chart for details).  Clinical Course     Patient with persistent productive cough for the past several weeks. His chest x-ray is clear. He will be treated for a bronchitis with Zithromax. To return as needed for any problems.  Final Clinical Impressions(s) / ED Diagnoses   Final diagnoses:  None    New Prescriptions New Prescriptions   No medications on file     Veryl Speak, MD 02/09/16 [phone]

## 2016-02-09 NOTE — Discharge Instructions (Signed)
Zithromax as prescribed.  Over-the-counter cough and cold medications as needed for symptom relief.  Return to the ER if symptoms significantly worsen or change.

## 2016-02-09 NOTE — ED Notes (Signed)
Back from xray, no changes, alert, NAD, calm.

## 2016-02-09 NOTE — ED Notes (Signed)
To x-ray

## 2016-02-09 NOTE — ED Notes (Signed)
Dr. Delo into room.  

## 2016-02-09 NOTE — ED Triage Notes (Signed)
Here for productive cough, nasal and chest congestion, chills, and diarrhea. Cough onset 1-2 weeks ago, diarrhea onset yesterday. Describes diarrhea as 2 episodes, brown and watery w/o blood. Also reports post-tussive emesis, neck sore, and ears ache, (denies: nausea, CP,sob, fever, sore throat or dizziness), "wife was sick last week with GI bug". Rates pain 5/10. Alert, NAD, calm, interactive, resps e/u, speaking in clear complete sentences. No dyspnea noted. LS CTA. Has not had pneumo vax or flu vaccine. Smoker. "Has an inhaler, but does not use it".

## 2016-03-29 ENCOUNTER — Emergency Department (HOSPITAL_BASED_OUTPATIENT_CLINIC_OR_DEPARTMENT_OTHER)
Admission: EM | Admit: 2016-03-29 | Discharge: 2016-03-30 | Disposition: A | Discharge status: Home / Self Care | Payer: Self-pay | Attending: Emergency Medicine | Admitting: Emergency Medicine

## 2016-03-29 ENCOUNTER — Encounter (HOSPITAL_BASED_OUTPATIENT_CLINIC_OR_DEPARTMENT_OTHER): Payer: Self-pay | Admitting: *Deleted

## 2016-03-29 ENCOUNTER — Emergency Department (HOSPITAL_BASED_OUTPATIENT_CLINIC_OR_DEPARTMENT_OTHER): Payer: Self-pay

## 2016-03-29 DIAGNOSIS — J069 Acute upper respiratory infection, unspecified: Secondary | ICD-10-CM | POA: Insufficient documentation

## 2016-03-29 DIAGNOSIS — F17210 Nicotine dependence, cigarettes, uncomplicated: Secondary | ICD-10-CM | POA: Insufficient documentation

## 2016-03-29 DIAGNOSIS — Z79899 Other long term (current) drug therapy: Secondary | ICD-10-CM | POA: Insufficient documentation

## 2016-03-29 DIAGNOSIS — R1110 Vomiting, unspecified: Secondary | ICD-10-CM | POA: Insufficient documentation

## 2016-03-29 IMAGING — DX DG CHEST 2V
2 series · 2 of 2 positions shown · non-contrast
Comparison: Radiographs [DATE], [YQ]

CLINICAL DATA: Cough for 1 week.  History of sarcoid.

EXAM:
CHEST  2 VIEW

[chest pa]
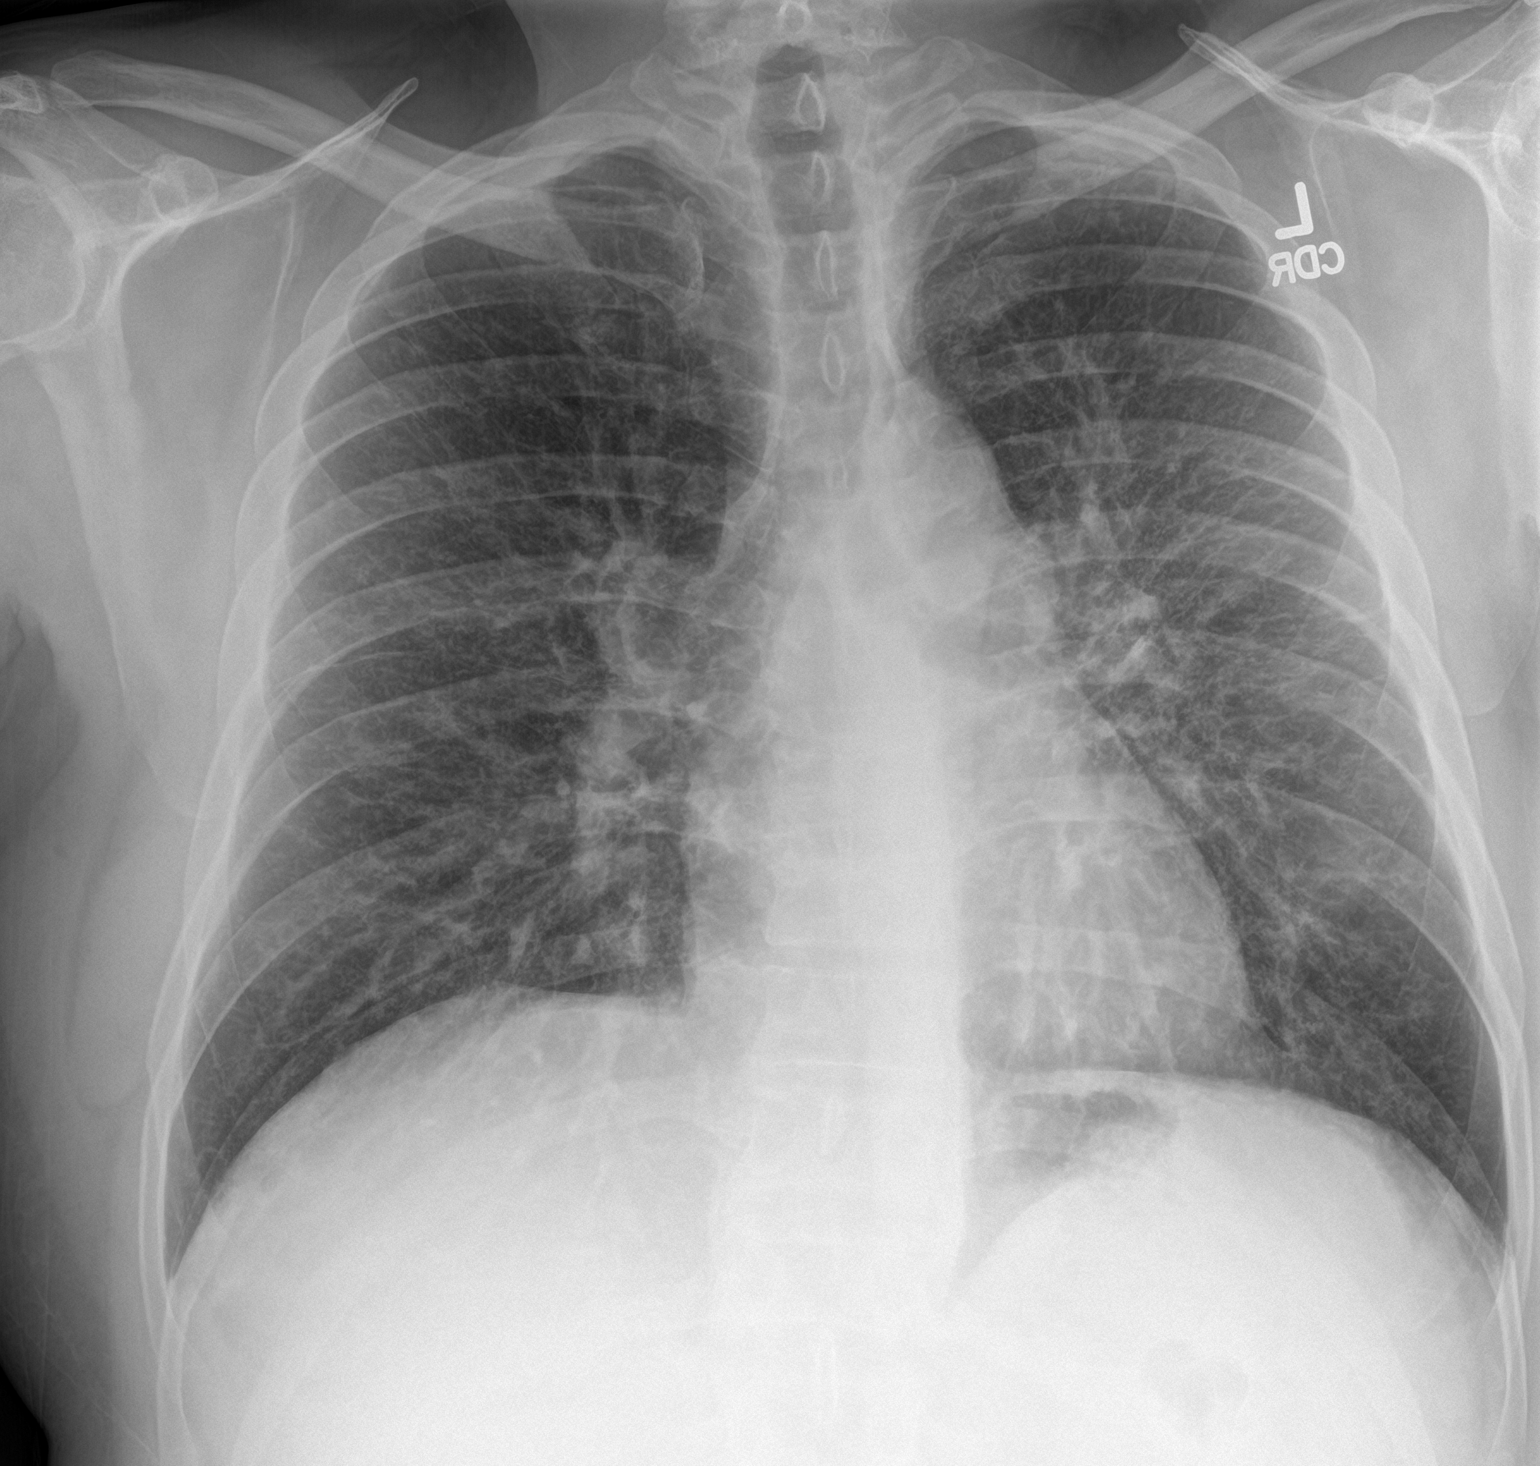

[chest lat]
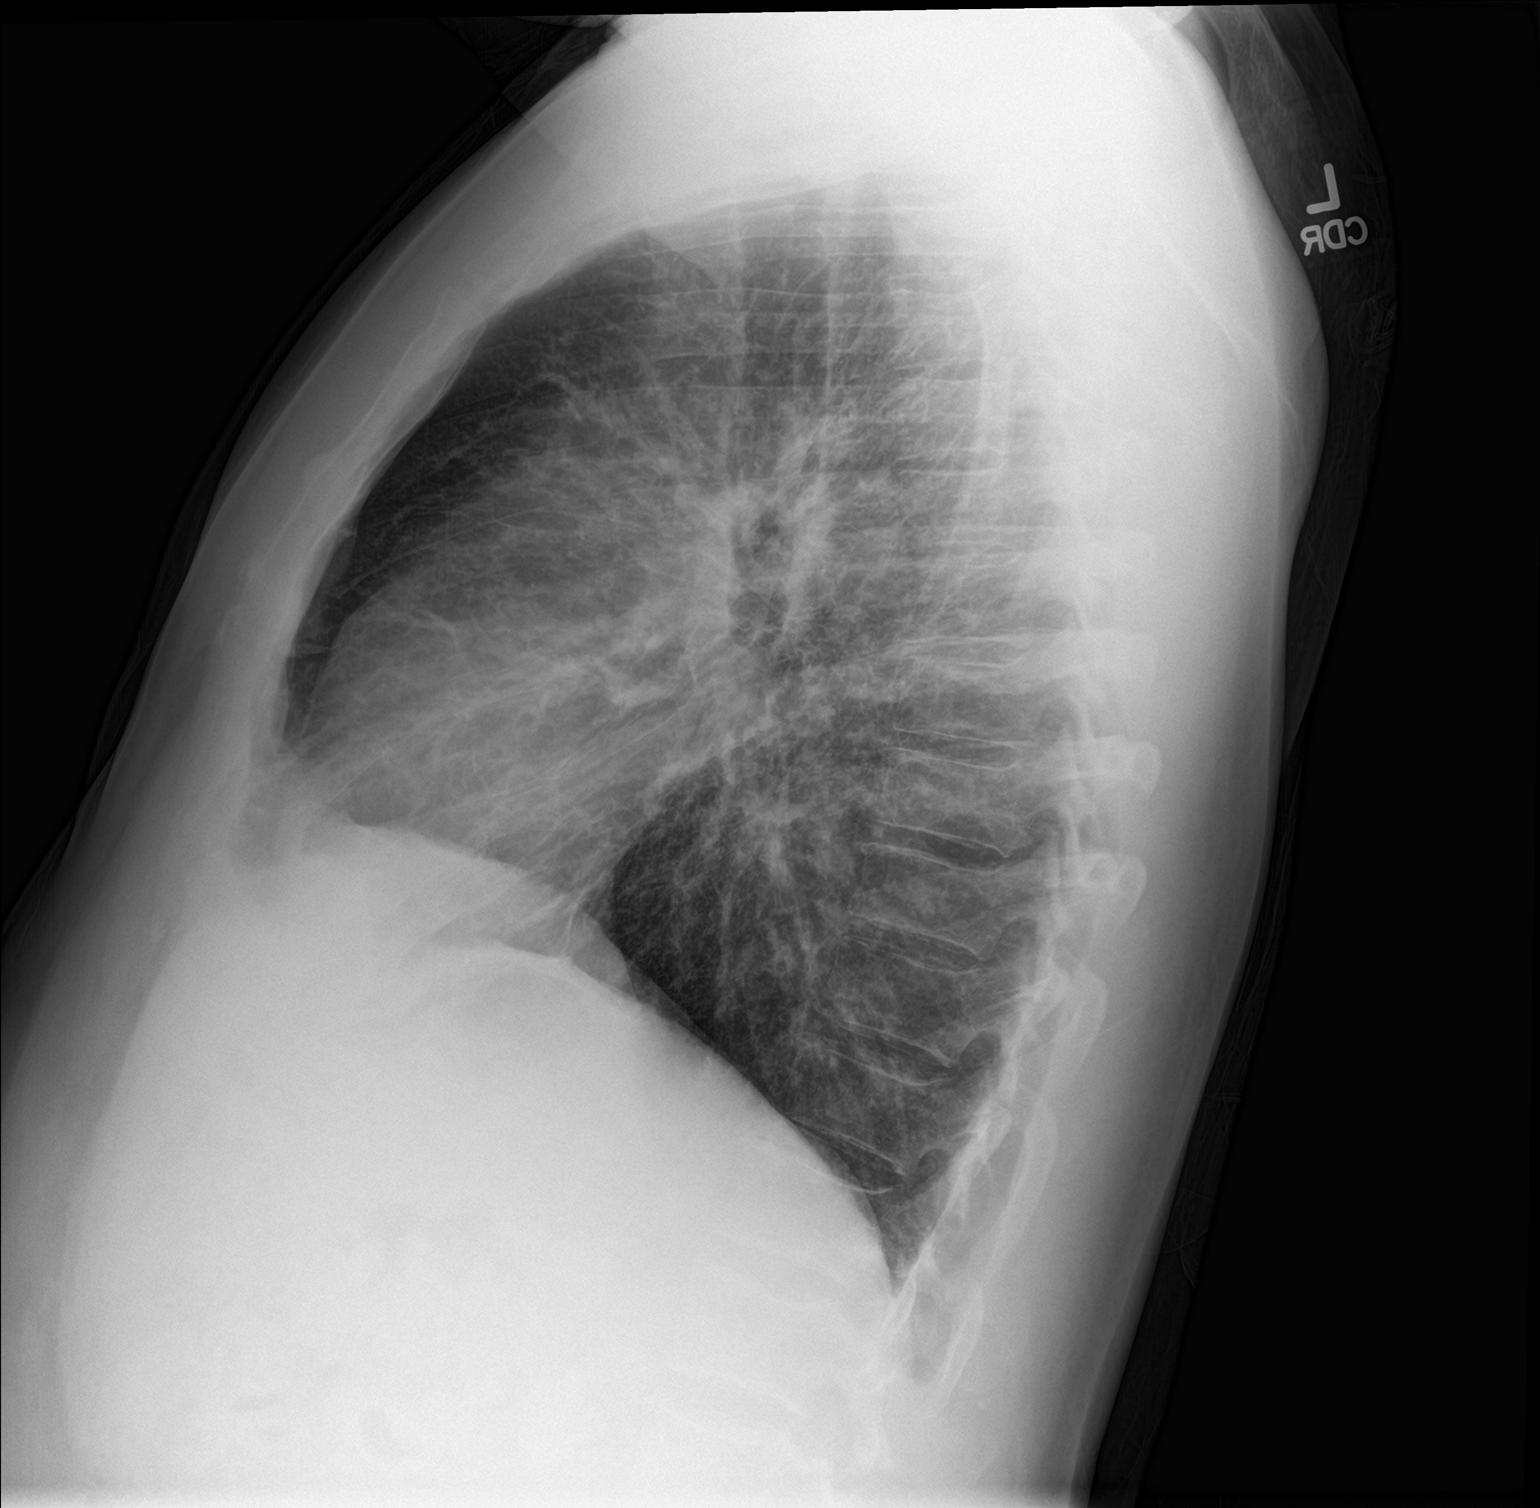

[2 of 2 positions shown; findings below may reference images not displayed]

FINDINGS: Stable heart size and mediastinal contours. Prominent hila and soft
tissue density in the AP window is unchanged from prior exam and
likely secondary to adenopathy. Interstitial coarsening diffusely
in, most prominent in the perihilar regions, stable from prior exam.
Stable right basilar scarring. No new focal airspace disease or
confluent opacity to suggest pneumonia. No pulmonary edema. No
pleural fluid or pneumothorax. No acute osseous abnormality.
IMPRESSION: Chronic changes are stable consistent with sarcoidosis. No acute
abnormality. No confluent pneumonia.

## 2016-03-29 NOTE — ED Provider Notes (Signed)
[facility] DEPT [facility] Provider Note   CSN: [id number] Arrival date & time: 03/29/16  2238   By signing my name below, I, Trevor Reilly, attest that this documentation has been prepared under the direction and in the presence of Eliezer Mccoy, PA-C Electronically Signed: Soijett Reilly, ED Scribe. 03/29/16. 11:17 PM.  History   Chief Complaint Chief Complaint  Patient presents with  . URI    HPI Trevor Reilly is a 46 y.o. male with a PMHx of sarcoidosis, bronchitis, who presents to the Emergency Department complaining of URI-like symptoms onset 1 week ago. Pt reports associated nasal congestion, subjective fever, chest wall muscle soreness due to cough, abdominal muscle soreness due to cough, post-tussive emesis, and 2 day history resolved diarrhea. Pt has tried benadryl and nyquil without relief of his symptoms. Pt states that he "spits up blood" daily for several years and he attributes this to his hx of sarcoidosis. Pt notes that he has notified his PCP of this in the past. He denies sore throat, leg pain, leg swelling, and any other symptoms. 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    The history is provided by the patient. No language interpreter was used.    Past Medical History:  Diagnosis Date  . Bronchitis   . Reflux   . Sarcoidosis ([place])   . Testicular tumor 2007    There are no active problems to display for this patient.   Past Surgical History:  Procedure Laterality Date  . arm surgery    . LUNG BIOPSY         Home Medications    Prior to Admission medications   Medication Sig Start Date End Date Taking? Authorizing Provider  albuterol (PROVENTIL HFA;VENTOLIN HFA) 108 (90 BASE) MCG/ACT inhaler Inhale 2 puffs into the lungs every 6 (six) hours as needed. For shortness of breath and wheezing     Historical Provider, MD  benzonatate (TESSALON) 100 MG capsule Take 1 capsule (100 mg total) by mouth every 8 (eight) hours.  03/30/16   Illianna Paschal M Alaiah Lundy, PA-C  cetirizine-pseudoephedrine (ZYRTEC-D) 5-120 MG tablet Take 1 tablet by mouth 2 (two) times daily. 03/30/16   Garlen Reinig M Rhilynn Preyer, PA-C  mometasone (NASONEX) 50 MCG/ACT nasal spray Place 2 sprays into the nose daily. 04/07/13   Calvert Cantor, MD  oxyCODONE-acetaminophen (PERCOCET/ROXICET) 5-325 MG per tablet Take 1-2 tablets by mouth every 6 (six) hours as needed for pain. 09/19/12   Calvert Cantor, MD    Family History History reviewed. No pertinent family history.  Social History Social History  Substance Use Topics  . Smoking status: Current Every Day Smoker    Packs/day: 1.00    Years: 25.00    Types: Cigarettes  . Smokeless tobacco: Never Used  . Alcohol use Yes     Allergies   Patient has no known allergies.   Review of Systems Review of Systems  Constitutional: Positive for fever (subjective). Negative for chills.  HENT: Negative for facial swelling and sore throat.   Respiratory: Negative for shortness of breath.        +chest wall muscle soreness due to cough  Cardiovascular: Negative for chest pain and leg swelling.  Gastrointestinal: Positive for abdominal pain (muscle soreness due to cough), diarrhea (resolved) and vomiting (post-tussive). Negative for nausea.  Genitourinary: Negative for dysuria.  Musculoskeletal: Negative for arthralgias and back pain.  Skin: Negative for rash and wound.  Neurological: Negative for headaches.  Psychiatric/Behavioral:  The patient is not nervous/anxious.      Physical Exam Updated Vital Signs BP (!) 150/108 (BP Location: Right Arm)   Pulse 87   Temp 98.4 F (36.9 C) (Oral)   Resp 18   Ht 5\' 9"  (1.753 m)   Wt 90.7 kg   SpO2 98%   BMI 29.53 kg/m   Physical Exam  Constitutional: He appears well-developed and well-nourished. No distress.  HENT:  Head: Normocephalic and atraumatic.  Mouth/Throat: Oropharynx is clear and moist. No oropharyngeal exudate.  Eyes: Conjunctivae are normal. Pupils  are equal, round, and reactive to light. Right eye exhibits no discharge. Left eye exhibits no discharge. No scleral icterus.  Neck: Normal range of motion. Neck supple. No thyromegaly present.  Cardiovascular: Normal rate, regular rhythm, normal heart sounds and intact distal pulses.  Exam reveals no gallop and no friction rub.   No murmur heard. Pulmonary/Chest: Effort normal. No stridor. No respiratory distress. He has decreased breath sounds. He has no wheezes. He has rhonchi. He has rales.  Decreased breath sounds. Rhonchi and rales to right lung. Rales noted to left lower lung.  Abdominal: Soft. Bowel sounds are normal. He exhibits no distension. There is no tenderness. There is no rebound and no guarding.  Musculoskeletal: He exhibits no edema.  No calf TTP bilaterally  Lymphadenopathy:    He has no cervical adenopathy.  Neurological: He is alert. Coordination normal.  Skin: Skin is warm and dry. No rash noted. He is not diaphoretic. No pallor.  Psychiatric: He has a normal mood and affect.  Nursing note and vitals reviewed.    ED Treatments / Results  DIAGNOSTIC STUDIES: Oxygen Saturation is 98% on RA, nl by my interpretation.    COORDINATION OF CARE: 11:13 PM Discussed treatment plan with pt at bedside which includes CXR and pt agreed to plan.  Radiology Dg Chest 2 View  Result Date: 03/30/2016 CLINICAL DATA:  Cough for 1 week.  History of sarcoid. EXAM: CHEST  2 VIEW COMPARISON:  Radiographs 02/09/2016, 5237 FINDINGS: Stable heart size and mediastinal contours. Prominent hila and soft tissue density in the AP window is unchanged from prior exam and likely secondary to adenopathy. Interstitial coarsening diffusely in, most prominent in the perihilar regions, stable from prior exam. Stable right basilar scarring. No new focal airspace disease or confluent opacity to suggest pneumonia. No pulmonary edema. No pleural fluid or pneumothorax. No acute osseous abnormality. IMPRESSION:  Chronic changes are stable consistent with sarcoidosis. No acute abnormality. No confluent pneumonia. Electronically Signed   By: Jeb Levering M.D.   On: 03/30/2016 00:22    Procedures Procedures (including critical care time)  Medications Ordered in ED Medications  albuterol (PROVENTIL HFA;VENTOLIN HFA) 108 (90 Base) MCG/ACT inhaler 1-2 puff (not administered)     Initial Impression / Assessment and Plan / ED Course  I have reviewed the triage vital signs and the nursing notes.  Pertinent imaging results that were available during my care of the patient were reviewed by me and considered in my medical decision making (see chart for details).     CXR shows chronic changes that are stable consistent with sarcoidosis; no acute abnormality or confluent pneumonia. Patient vitals stable, afebrile. Patient's hemoptysis is at baseline due to sarcoid. Doubt PE. Most viral upper respiratory infection. Will treat symptomatically with albuterol inhaler, Tessalon, Zyrtec-D. Follow-up to PCP in 3-5 days if symptoms are not improving. Strict return precautions given. Patient understands and agrees with plan. Patient vitals stable throughout ED  course and discharged in satisfactory condition. I discussed patient case with Dr. Regenia Skeeter who guided the patient's management and agrees with plan.  Final Clinical Impressions(s) / ED Diagnoses   Final diagnoses:  Upper respiratory tract infection, unspecified type    New Prescriptions New Prescriptions   BENZONATATE (TESSALON) 100 MG CAPSULE    Take 1 capsule (100 mg total) by mouth every 8 (eight) hours.   CETIRIZINE-PSEUDOEPHEDRINE (ZYRTEC-D) 5-120 MG TABLET    Take 1 tablet by mouth 2 (two) times daily.   I personally performed the services described in this documentation, which was scribed in my presence. The recorded information has been reviewed and is accurate.     [id number] PA-C 03/30/16 [id number]    Sherwood Gambler, MD 03/30/16  [phone]

## 2016-03-29 NOTE — ED Triage Notes (Signed)
Pt c/o URi symptoms x 1 week  

## 2016-03-30 MED ORDER — ALBUTEROL SULFATE HFA 108 (90 BASE) MCG/ACT IN AERS
1.0000 | INHALATION_SPRAY | Freq: Once | RESPIRATORY_TRACT | Status: AC
  Administered 2016-03-30: 2 via RESPIRATORY_TRACT
  Filled 2016-03-30: qty 6.7

## 2016-03-30 MED ORDER — BENZONATATE 100 MG PO CAPS: 100.0000 mg | ORAL_CAPSULE | Freq: Three times a day (TID) | ORAL | 0 refills | Status: DC

## 2016-03-30 MED ORDER — CETIRIZINE-PSEUDOEPHEDRINE ER 5-120 MG PO TB12: 1.0000 | ORAL_TABLET | Freq: Two times a day (BID) | ORAL | 0 refills | Status: DC

## 2016-03-30 NOTE — Discharge Instructions (Signed)
Medications: Albuterol inhaler, Tessalon, Zyrtec-D  Treatment: Use albuterol inhaler every 4-6 hours as needed for wheezing, shortness of breath, or cough. Take Tessalon every 8 hours as needed for cough. Take Zyrtec-D twice daily for your nasal congestion.  Follow-up: Please follow-up with your primary care provider if your symptoms are not improving over the next 5 days, as antibiotic treatment may be useful at that time. However, your infection is most likely viral. Please return to emergency department if you develop any new or worsening symptoms.

## 2016-05-20 DIAGNOSIS — R911 Solitary pulmonary nodule: Secondary | ICD-10-CM | POA: Insufficient documentation

## 2016-05-20 DIAGNOSIS — R109 Unspecified abdominal pain: Secondary | ICD-10-CM | POA: Insufficient documentation

## 2016-05-23 ENCOUNTER — Telehealth: Payer: Self-pay

## 2016-05-23 ENCOUNTER — Ambulatory Visit: Payer: Self-pay | Admitting: Medical

## 2016-05-23 NOTE — Telephone Encounter (Signed)
Patient father in law passed away therefore he will not be able to keep he's 3pm new patient appointment, charge or no charge

## 2016-05-24 NOTE — Telephone Encounter (Signed)
No charge. 

## 2016-05-27 ENCOUNTER — Encounter: Payer: Self-pay | Admitting: Medical

## 2016-05-27 ENCOUNTER — Encounter: Payer: Self-pay | Admitting: Gastroenterology

## 2016-05-27 ENCOUNTER — Ambulatory Visit (INDEPENDENT_AMBULATORY_CARE_PROVIDER_SITE_OTHER): Payer: BLUE CROSS/BLUE SHIELD | Admitting: Medical

## 2016-05-27 VITALS — BP 147/92 | HR 93 | Temp 98.5°F | Resp 16 | Ht 69.0 in | Wt 202.0 lb

## 2016-05-27 DIAGNOSIS — F17200 Nicotine dependence, unspecified, uncomplicated: Secondary | ICD-10-CM

## 2016-05-27 DIAGNOSIS — I10 Essential (primary) hypertension: Secondary | ICD-10-CM

## 2016-05-27 DIAGNOSIS — D869 Sarcoidosis, unspecified: Secondary | ICD-10-CM | POA: Diagnosis not present

## 2016-05-27 DIAGNOSIS — R0683 Snoring: Secondary | ICD-10-CM

## 2016-05-27 DIAGNOSIS — R1011 Right upper quadrant pain: Secondary | ICD-10-CM

## 2016-05-27 DIAGNOSIS — R739 Hyperglycemia, unspecified: Secondary | ICD-10-CM

## 2016-05-27 DIAGNOSIS — N433 Hydrocele, unspecified: Secondary | ICD-10-CM

## 2016-05-27 DIAGNOSIS — K219 Gastro-esophageal reflux disease without esophagitis: Secondary | ICD-10-CM

## 2016-05-27 MED ORDER — AMLODIPINE BESYLATE 5 MG PO TABS: 5.0000 mg | ORAL_TABLET | Freq: Every day | ORAL | 0 refills | Status: DC

## 2016-05-27 NOTE — Progress Notes (Signed)
Subjective:    Patient ID: Trevor Reilly, male    DOB: 03/22/1970, 46 y.o.   MRN: [id number]  HPI   Pt in for first time. He was seen at [facility].  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 Has girlfriend.    Pt used to see Dr. Orma Render as pcp. Pt has seen Pulmonologist recently.  Pt states Dr Orma Render was his former pcp. Pt lung specialist has been ordered sleep study and order upcoming CT scan.(follownig nodule and sarcoidosis)  Pt states recently his bp has been high over past 3 months. About same  level today as over past 3 months  per pt report. Pt sugar were high in ED. But no know history of diabetes. But no a1c on review per pt.  Pt also states long history of heart burn. At least 4 years. Pt states daily prilosec for years. Was on zantac before. Pt states needs referral to GI. Symptoms despite treatment.  Pt also told likely has hydrocele. Large likely. (acutally has already seen by urologist who evaluated the area in 2007  Pt states sometime feel mood and depressed.  But denies any severity and does not want to be on any meds.   Review of Systems  Constitutional: Negative for chills, fatigue and fever.  HENT: Negative for congestion, drooling, ear pain, postnasal drip, rhinorrhea, sinus pain and sinus pressure.   Respiratory: Negative for cough, chest tightness, shortness of breath and wheezing.   Cardiovascular: Negative for chest pain and palpitations.  Gastrointestinal: Positive for abdominal pain. Negative for abdominal distention, anal bleeding, blood in stool, constipation, diarrhea, nausea, rectal pain and vomiting.  Endocrine: Negative for polydipsia, polyphagia and polyuria.  Musculoskeletal: Negative for back pain, myalgias and neck pain.  Skin: Negative for rash.  Hematological: Negative for adenopathy. Does not bruise/bleed easily.    Psychiatric/Behavioral: Positive for dysphoric mood. Negative for behavioral problems, decreased concentration, hallucinations, sleep disturbance and suicidal ideas. The patient is not nervous/anxious and is not hyperactive.        Mild mood decreased.    Past Medical History:  Diagnosis Date  . Allergy   . Bronchitis   . Depression   . GERD (gastroesophageal reflux disease)   . History of rectal bleeding   . Hydrocele   . Reflux   . Sarcoidosis ([place])   . Testicular tumor 2007     Social History   Social History  . Marital status: Divorced    Spouse name: N/A  . Number of children: N/A  . Years of education: N/A   Occupational History  . Not on file.   Social History Main Topics  . Smoking status: Current Every Day Smoker    Packs/day: 1.00    Years: 25.00    Types: Cigarettes  . Smokeless tobacco: Never Used  . Alcohol use 8.4 oz/week    14 Cans of beer per week  . Drug use: No  . Sexual activity: Not on file   Other Topics Concern  . Not on file   Social History Narrative  . No narrative on file    Past Surgical History:  Procedure Laterality Date  . arm surgery    . LUNG BIOPSY      Family History  Problem Relation Age of Onset  . Gout Mother   . Hypertension Mother   . Thyroid disease Mother   .  Stroke Father   . Heart attack Father   . Thyroid disease Sister   . Hypertension Brother   . Sarcoidosis Cousin     No Known Allergies  No current outpatient prescriptions on file prior to visit.   No current facility-administered medications on file prior to visit.     BP (!) 142/99 (BP Location: Left Arm, Cuff Size: Normal)   Pulse 93   Temp 98.5 F (36.9 C) (Oral)   Resp 16   Ht 5\' 9"  (1.753 m)   Wt 202 lb (91.6 kg)   SpO2 99% Comment: room air  BMI 29.83 kg/m       Objective:   Physical Exam  General Mental Status- Alert. General Appearance- Not in acute distress.   Skin General: Color- Normal Color. Moisture- Normal  Moisture.  Neck Carotid Arteries- Normal color. Moisture- Normal Moisture. No carotid bruits. No JVD.  Chest and Lung Exam Auscultation: Breath Sounds:-Normal.  Cardiovascular Auscultation:Rythm- Regular. Murmurs & Other Heart Sounds:Auscultation of the heart reveals- No Murmurs.  Abdomen Inspection:-Inspeection Normal. Palpation/Percussion:Note:No mass. Palpation and Percussion of the abdomen reveal- rt upper quadrant  Tender, Non Distended + BS, no rebound or guarding.    Neurologic Cranial Nerve exam:- CN III-XII intact(No nystagmus), symmetric smile. Strength:- 5/5 equal and symmetric strength both upper and lower extremities.      Assessment & Plan:  For sarcoidosis follow up with pulmonologist get sleep study and ct scan as they ordered.  For high bp rx amlodipine 5 mg a day. Recheck bp in 2 wks. Might need to increase to 10 mg a day.  For gerd get protonix and use. Referral to GI.  For abdomen pain get labs and schedule to get US abdomen this week.  For smoking can try to stop by vaping. Wellbutrin option to consider.  For high sugar in past get a1c today.  Referral to urologist for hydrocele.  Follow up in 2 weeks or as needed  Mirayah Wren, Percell Miller, [employer]

## 2016-05-27 NOTE — Progress Notes (Signed)
Pre visit review using our clinic review tool, if applicable. No additional management support is needed unless otherwise documented below in the visit note. 

## 2016-05-27 NOTE — Patient Instructions (Addendum)
For sarcoidosis follow up with pulmonologist to get sleep study and ct scan as they ordered.  For high bp rx amlodipine 5 mg a day. Recheck bp in 2 wks. Might need to increase to 10 mg a day.  For gerd get protonix and use. Referral to GI.  For abdomen pain get labs and schedule to get US abdomen this week.  For smoking can try to stop by vaping. Wellbutrin option to consider.  For high sugar in past get a1c today.  Referral to urologist for hydrocele.  Follow up in 2 weeks or as needed

## 2016-05-28 ENCOUNTER — Telehealth: Payer: Self-pay

## 2016-05-28 LAB — COMPREHENSIVE METABOLIC PANEL
ALT: 41 U/L (ref 0–53)
AST: 111 U/L — ABNORMAL HIGH (ref 0–37)
Albumin: 3.5 g/dL (ref 3.5–5.2)
Alkaline Phosphatase: 138 U/L — ABNORMAL HIGH (ref 39–117)
BILIRUBIN TOTAL: 0.6 mg/dL (ref 0.2–1.2)
BUN: 7 mg/dL (ref 6–23)
CALCIUM: 9.3 mg/dL (ref 8.4–10.5)
CHLORIDE: 102 meq/L (ref 96–112)
CO2: 28 meq/L (ref 19–32)
CREATININE: 0.9 mg/dL (ref 0.40–1.50)
GFR: 117.34 mL/min (ref >60.00)
GLUCOSE: 97 mg/dL (ref 70–99)
Potassium: 4.7 mEq/L (ref 3.5–5.1)
Sodium: 138 mEq/L (ref 135–145)
TOTAL PROTEIN: 9.1 g/dL — AB (ref 6.0–8.3)

## 2016-05-28 LAB — CBC WITH DIFFERENTIAL/PLATELET
BASOS PCT: 1 % (ref 0.0–3.0)
Basophils Absolute: 0 10*3/uL (ref 0.0–0.1)
EOS PCT: 2.2 % (ref 0.0–5.0)
Eosinophils Absolute: 0.1 10*3/uL (ref 0.0–0.7)
HCT: 55.3 % — ABNORMAL HIGH (ref 39.0–52.0)
LYMPHS ABS: 0.9 10*3/uL (ref 0.7–4.0)
Lymphocytes Relative: 18.8 % (ref 12.0–46.0)
MCHC: 33.6 g/dL (ref 30.0–36.0)
MCV: 97 fl (ref 78.0–100.0)
MONOS PCT: 13.5 % — AB (ref 3.0–12.0)
Monocytes Absolute: 0.6 10*3/uL (ref 0.1–1.0)
NEUTROS PCT: 64.5 % (ref 43.0–77.0)
Neutro Abs: 3 10*3/uL (ref 1.4–7.7)
Platelets: 239 10*3/uL (ref 150.0–400.0)
RBC: 5.7 Mil/uL (ref 4.22–5.81)
RDW: 15.6 % — AB (ref 11.5–15.5)
WBC: 4.7 10*3/uL (ref 4.0–10.5)

## 2016-05-28 LAB — LIPASE: LIPASE: 52 U/L (ref 11.0–59.0)

## 2016-05-28 LAB — AMYLASE: Amylase: 44 U/L (ref 27–131)

## 2016-05-28 NOTE — Telephone Encounter (Signed)
Pt hb and hct increase can be related to smoking. But this is little higher than usual for him per chart review. So can you get him to follow up Monday or Tuesday this coming week. Sooner than formerly planned. Will repeat the cbc. If values increasing may refer him to specialist.

## 2016-05-28 NOTE — Telephone Encounter (Signed)
Notified pt and he voices understanding. Scheduled f/u for 06/04/16 at 10:30am with PCP.

## 2016-05-28 NOTE — Telephone Encounter (Signed)
CRITICAL VALUE STICKER  CRITICAL VALUE: HGB 18.6  RECEIVER (on-site recipient of call):Josuel Koeppen   DATE & TIME NOTIFIED: 05/28/16 11:15am  MESSENGER (representative from lab):[facility]  MD NOTIFIED: Zane Herald  TIME OF NOTIFICATION:11:36am  RESPONSE: Per verbal from PCP, route note and he will advise.

## 2016-05-31 ENCOUNTER — Other Ambulatory Visit: Payer: BLUE CROSS/BLUE SHIELD

## 2016-06-04 ENCOUNTER — Ambulatory Visit: Payer: BLUE CROSS/BLUE SHIELD | Admitting: Gastroenterology

## 2016-06-04 ENCOUNTER — Ambulatory Visit: Payer: BLUE CROSS/BLUE SHIELD | Admitting: Medical

## 2016-06-05 ENCOUNTER — Ambulatory Visit (HOSPITAL_BASED_OUTPATIENT_CLINIC_OR_DEPARTMENT_OTHER): Payer: BLUE CROSS/BLUE SHIELD

## 2016-06-06 ENCOUNTER — Other Ambulatory Visit: Payer: BLUE CROSS/BLUE SHIELD

## 2016-06-10 ENCOUNTER — Ambulatory Visit (HOSPITAL_BASED_OUTPATIENT_CLINIC_OR_DEPARTMENT_OTHER)
Admission: RE | Admit: 2016-06-10 | Discharge: 2016-06-10 | Disposition: A | Discharge status: Home / Self Care | Payer: BLUE CROSS/BLUE SHIELD | Source: Ambulatory Visit | Attending: Medical | Admitting: Medical

## 2016-06-10 ENCOUNTER — Encounter: Payer: Self-pay | Admitting: Medical

## 2016-06-10 ENCOUNTER — Telehealth: Payer: Self-pay | Admitting: Medical

## 2016-06-10 DIAGNOSIS — R1011 Right upper quadrant pain: Secondary | ICD-10-CM | POA: Diagnosis present

## 2016-06-10 DIAGNOSIS — K769 Liver disease, unspecified: Secondary | ICD-10-CM | POA: Insufficient documentation

## 2016-06-10 NOTE — Telephone Encounter (Signed)
GI sent note to me stating he was a no show? What happened. Still recommend referral. If he agrees to go can he be reshceduled?  Will you call him and see if he is willing to go?

## 2016-06-11 ENCOUNTER — Ambulatory Visit: Payer: BLUE CROSS/BLUE SHIELD | Admitting: Medical

## 2016-06-11 ENCOUNTER — Encounter: Payer: Self-pay | Admitting: Internal Medicine

## 2016-06-11 NOTE — Telephone Encounter (Signed)
Left message for patient to return my call.

## 2016-06-11 NOTE — Telephone Encounter (Signed)
Called pt and gave him GI's phone to call and make an appointment to be seen. Pt agreed and state he will call today. LB

## 2016-06-11 NOTE — Telephone Encounter (Signed)
I contacted pt and he is willing to reschedule. He stated his girlfriend keeps up with appts and he was unaware that he missed an appt.   Colletta [place], would GI be willing to reschedule since pt was no show?

## 2016-06-14 ENCOUNTER — Ambulatory Visit: Payer: BLUE CROSS/BLUE SHIELD | Admitting: Medical

## 2016-06-14 ENCOUNTER — Telehealth: Payer: Self-pay | Admitting: Medical

## 2016-06-14 NOTE — Telephone Encounter (Signed)
He missed appointment with me. Will you ask him to reschedule.

## 2016-06-19 ENCOUNTER — Ambulatory Visit (INDEPENDENT_AMBULATORY_CARE_PROVIDER_SITE_OTHER): Payer: BLUE CROSS/BLUE SHIELD | Admitting: Medical

## 2016-06-19 ENCOUNTER — Encounter: Payer: Self-pay | Admitting: Medical

## 2016-06-19 VITALS — BP 123/86 | HR 110 | Temp 98.3°F | Resp 16 | Ht 69.0 in | Wt 205.8 lb

## 2016-06-19 DIAGNOSIS — K760 Fatty (change of) liver, not elsewhere classified: Secondary | ICD-10-CM

## 2016-06-19 DIAGNOSIS — K219 Gastro-esophageal reflux disease without esophagitis: Secondary | ICD-10-CM | POA: Diagnosis not present

## 2016-06-19 DIAGNOSIS — I10 Essential (primary) hypertension: Secondary | ICD-10-CM | POA: Diagnosis not present

## 2016-06-19 DIAGNOSIS — R1011 Right upper quadrant pain: Secondary | ICD-10-CM

## 2016-06-19 DIAGNOSIS — F17200 Nicotine dependence, unspecified, uncomplicated: Secondary | ICD-10-CM

## 2016-06-19 DIAGNOSIS — D582 Other hemoglobinopathies: Secondary | ICD-10-CM

## 2016-06-19 DIAGNOSIS — N529 Male erectile dysfunction, unspecified: Secondary | ICD-10-CM

## 2016-06-19 MED ORDER — SILDENAFIL CITRATE 50 MG PO TABS: 50.0000 mg | ORAL_TABLET | Freq: Every day | ORAL | 0 refills | Status: DC | PRN

## 2016-06-19 NOTE — Patient Instructions (Signed)
Your bp is much better today. Continue the norvasc. Your fatigue side effect may dissipate as you mentioned both your mom and brother had this side effect initially then it stopped. But will see how you feel in 10-14 days. If you still are convinced side effect then can switch bp med.  For gerd continue current meds and please keep GI appointment.  For ruq pain and fatty liver GI should give opinion on this as well. Please ask them directly to review then abdomen US and to review you liver enzymes.  For fatty liver. Avoid fatty food, alcohol, tylenol and fructose.  For ED rx viagra.  Please consider stopping smoking  Follow up in 3 weeks or as needed.  Please get h yplori test today and recheck cbc/hb +hct.

## 2016-06-19 NOTE — Progress Notes (Signed)
Subjective:    Patient ID: Trevor Reilly, male    DOB: 03-23-1970, 46 y.o.   MRN: [id number]  HPI   Pt in follow up.   Pt states bp medication. Pt states amlodipine makes him feel drowsy and tired. He notes a direct correlation when he took first dose.. States noticed on first day taking the med. Pt mom and brother stated they felt fatigued and eventually they got used to med and did not have the side effect of fatigue after a while. Pt bp was 147/92 and now better. Pt describing fatigue sensation less now than it was on first day.  Pt states he has low libido and states has issues with getting erection for past 6 months.  Pt does smoke.  Pt missed the referral to GI. But he got rescheduled to GI coming up next month. Also some fatty liver on recent US. Pt never got h pylori test. He states will do today.  Pt had hb/hct elevated on labs. I wanted to repeat that today.     Review of Systems  Constitutional: Negative for chills, fatigue and fever.  Respiratory: Negative for cough, chest tightness and shortness of breath.   Cardiovascular: Negative for chest pain and palpitations.  Gastrointestinal: Positive for abdominal pain. Negative for abdominal distention, blood in stool, constipation, diarrhea, nausea and vomiting.       Gerd and ruq type pain.  Genitourinary: Negative for decreased urine volume, dysuria, frequency, genital sores, hematuria, penile swelling and testicular pain.       ED  Musculoskeletal: Negative for back pain.  Skin: Negative for rash.  Neurological: Negative for dizziness, syncope, facial asymmetry, weakness, numbness and headaches.  Hematological: Negative for adenopathy. Does not bruise/bleed easily.  Psychiatric/Behavioral: Negative for behavioral problems and confusion.    Past Medical History:  Diagnosis Date  . Allergy   . Bronchitis   . Depression   . GERD (gastroesophageal reflux disease)   . History of rectal bleeding   . Hydrocele   .  Reflux   . Sarcoidosis   . Testicular tumor 2007     Social History   Social History  . Marital status: Divorced    Spouse name: N/A  . Number of children: N/A  . Years of education: N/A   Occupational History  . Not on file.   Social History Main Topics  . Smoking status: Current Every Day Smoker    Packs/day: 1.00    Years: 25.00    Types: Cigarettes  . Smokeless tobacco: Never Used  . Alcohol use 8.4 oz/week    14 Cans of beer per week  . Drug use: No  . Sexual activity: Not on file   Other Topics Concern  . Not on file   Social History Narrative  . No narrative on file    Past Surgical History:  Procedure Laterality Date  . arm surgery    . LUNG BIOPSY      Family History  Problem Relation Age of Onset  . Gout Mother   . Hypertension Mother   . Thyroid disease Mother   . Stroke Father   . Heart attack Father   . Thyroid disease Sister   . Hypertension Brother   . Sarcoidosis Cousin     No Known Allergies  Current Outpatient Prescriptions on File Prior to Visit  Medication Sig Dispense Refill  . amLODipine (NORVASC) 5 MG tablet Take 1 tablet (5 mg total) by mouth daily. 30 tablet 0  .  potassium chloride (K-DUR) 10 MEQ tablet Take 1 tablet by mouth daily. For 5 days     No current facility-administered medications on file prior to visit.     BP 123/86 (BP Location: Right Arm, Patient Position: Sitting, Cuff Size: Normal)   Pulse (!) 110   Temp 98.3 F (36.8 C) (Oral)   Resp 16   Ht 5\' 9"  (1.753 m)   Wt 205 lb 12.8 oz (93.4 kg)   SpO2 96%   BMI 30.39 kg/m       Objective:   Physical Exam  General Mental Status- Alert. General Appearance- Not in acute distress.   Skin General: Color- Normal Color. Moisture- Normal Moisture.  Neck Carotid Arteries- Normal color. Moisture- Normal Moisture. No carotid bruits. No JVD.  Chest and Lung Exam Auscultation: Breath Sounds:-Normal.  Cardiovascular Auscultation:Rythm- Regular. Murmurs &  Other Heart Sounds:Auscultation of the heart reveals- No Murmurs.  Abdomen Inspection:-Inspeection Normal. Palpation/Percussion:Note:No mass. Palpation and Percussion of the abdomen reveal- Non Tender, Non Distended + BS, no rebound or guarding.    Neurologic Cranial Nerve exam:- CN III-XII intact(No nystagmus), symmetric smile. Strength:- 5/5 equal and symmetric strength both upper and lower extremities.      Assessment & Plan:  Your bp is much better today. Continue the norvasc. Your fatigue side effect may dissipate as you mentioned both your mom and brother had this side effect initially then it stopped. But will see how you feel in 10-14 days. If you still are convinced side effect then can switch bp med.  For gerd continue current meds and please keep GI appointment.  For ruq pain and fatty liver GI should give opinion on this as well. Please ask them directly to review then abdomen US and to review you liver enzymes.  For fatty liver. Avoid fatty food, alcohol, tylenol and fructose.  For ED rx viagra.  Please consider stopping smoking  Follow up in 3 weeks or as needed.  Please get h yplori test today and recheck cbc/hb +hct.

## 2016-06-19 NOTE — Progress Notes (Signed)
Pre visit review using our clinic review tool, if applicable. No additional management support is needed unless otherwise documented below in the visit note. 

## 2016-06-20 LAB — CBC WITH DIFFERENTIAL/PLATELET
BASOS ABS: 0 10*3/uL (ref 0.0–0.1)
Basophils Relative: 0.8 % (ref 0.0–3.0)
EOS PCT: 2.9 % (ref 0.0–5.0)
Eosinophils Absolute: 0.2 10*3/uL (ref 0.0–0.7)
HEMATOCRIT: 52.6 % — AB (ref 39.0–52.0)
Hemoglobin: 17.8 g/dL — ABNORMAL HIGH (ref 13.0–17.0)
LYMPHS ABS: 1.1 10*3/uL (ref 0.7–4.0)
Lymphocytes Relative: 19.1 % (ref 12.0–46.0)
MCHC: 33.8 g/dL (ref 30.0–36.0)
MCV: 96.7 fl (ref 78.0–100.0)
Monocytes Absolute: 0.6 10*3/uL (ref 0.1–1.0)
Monocytes Relative: 10.9 % (ref 3.0–12.0)
NEUTROS PCT: 66.3 % (ref 43.0–77.0)
Neutro Abs: 3.8 10*3/uL (ref 1.4–7.7)
Platelets: 189 10*3/uL (ref 150.0–400.0)
RBC: 5.44 Mil/uL (ref 4.22–5.81)
RDW: 14 % (ref 11.5–15.5)
WBC: 5.7 10*3/uL (ref 4.0–10.5)

## 2016-06-20 LAB — H. PYLORI BREATH TEST: H. pylori Breath Test: NOT DETECTED

## 2016-06-21 ENCOUNTER — Telehealth: Payer: Self-pay | Admitting: Medical

## 2016-06-21 NOTE — Telephone Encounter (Signed)
Caller name: Relationship to patient: Self Can be reached: [phone]  Pharmacy:  Reason for call: Patient is requesting to have an extended note for being out of work.

## 2016-06-22 NOTE — Telephone Encounter (Signed)
  What exactly does he mean by extended work note?? How many days? What days?

## 2016-06-24 NOTE — Telephone Encounter (Signed)
Patient states he wants to be out of work until the end of May and go back in June.

## 2016-06-25 ENCOUNTER — Telehealth: Payer: Self-pay | Admitting: Medical

## 2016-06-25 NOTE — Telephone Encounter (Signed)
Pt is requesting return to work note for June. He is relatively new pt to me. At the end of the exam I remember he wanted a return to work or few more days off work. Not sure. Need to find my note which I believe I wrote him. Will you find that in epic.   Then got call he wants to return end of May or June. He need office viist to discuss this. Usually with such long durtion abscenses employer may have forms for me to fill out. I need to know diagnosis/conditions for such and absence if in the end it is excused abscense.   What is his primary diagnosis/symptoms that he feels he wants to be off full until June?  I ask this since he is new to me? I have seen him twice so far.  If he agrees to come in schedule 8-9 am or 1-2 pm.

## 2016-06-26 NOTE — Telephone Encounter (Signed)
Called pt back after failed attempt to transfer call to RN. Pt clarified. He says that he never felt better his spouse was just insisting that he went back sooner than he felt per his body. Pt says that he still have the same concerns and was just unable to go back when he informed provider. Pt says that he didn't want to have a disagreement in the office with spouse so he decided to just go with what spouse said (that he's okay) for peace sake.  Pt says that he need to provide his job with excused note as soon as possible.

## 2016-06-26 NOTE — Telephone Encounter (Signed)
Called patient and left a message for call back  

## 2016-06-27 ENCOUNTER — Telehealth: Payer: Self-pay | Admitting: Medical

## 2016-06-27 NOTE — Telephone Encounter (Signed)
Pt states he just needs a note back to work today if possible.
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
## 2016-06-27 NOTE — Telephone Encounter (Signed)
Faxed letter to pt's job. Notified pt letter is being faxed. [phone]

## 2016-07-04 ENCOUNTER — Emergency Department (HOSPITAL_BASED_OUTPATIENT_CLINIC_OR_DEPARTMENT_OTHER)
Admission: EM | Admit: 2016-07-04 | Discharge: 2016-07-04 | Disposition: A | Discharge status: Home / Self Care | Payer: BLUE CROSS/BLUE SHIELD | Attending: Physician Assistant | Admitting: Physician Assistant

## 2016-07-04 ENCOUNTER — Encounter (HOSPITAL_BASED_OUTPATIENT_CLINIC_OR_DEPARTMENT_OTHER): Payer: Self-pay

## 2016-07-04 ENCOUNTER — Emergency Department (HOSPITAL_BASED_OUTPATIENT_CLINIC_OR_DEPARTMENT_OTHER): Payer: BLUE CROSS/BLUE SHIELD

## 2016-07-04 DIAGNOSIS — R0789 Other chest pain: Secondary | ICD-10-CM | POA: Diagnosis not present

## 2016-07-04 DIAGNOSIS — F17210 Nicotine dependence, cigarettes, uncomplicated: Secondary | ICD-10-CM | POA: Insufficient documentation

## 2016-07-04 DIAGNOSIS — Z79899 Other long term (current) drug therapy: Secondary | ICD-10-CM | POA: Insufficient documentation

## 2016-07-04 DIAGNOSIS — R112 Nausea with vomiting, unspecified: Secondary | ICD-10-CM | POA: Diagnosis not present

## 2016-07-04 DIAGNOSIS — R1013 Epigastric pain: Secondary | ICD-10-CM | POA: Diagnosis present

## 2016-07-04 LAB — CBC WITH DIFFERENTIAL/PLATELET
BASOS PCT: 1 %
Basophils Absolute: 0 10*3/uL (ref 0.0–0.1)
EOS ABS: 0.1 10*3/uL (ref 0.0–0.7)
Eosinophils Relative: 2 %
HCT: 47 % (ref 39.0–52.0)
Hemoglobin: 17.2 g/dL — ABNORMAL HIGH (ref 13.0–17.0)
Lymphocytes Relative: 14 %
Lymphs Abs: 0.8 10*3/uL (ref 0.7–4.0)
MCH: 32.6 pg (ref 26.0–34.0)
MCHC: 36.6 g/dL — ABNORMAL HIGH (ref 30.0–36.0)
MCV: 89.2 fL (ref 78.0–100.0)
Monocytes Absolute: 0.6 10*3/uL (ref 0.1–1.0)
Monocytes Relative: 11 %
NEUTROS ABS: 4.1 10*3/uL (ref 1.7–7.7)
NEUTROS PCT: 73 %
PLATELETS: 182 10*3/uL (ref 150–400)
RBC: 5.27 MIL/uL (ref 4.22–5.81)
RDW: 12.5 % (ref 11.5–15.5)
WBC: 5.7 10*3/uL (ref 4.0–10.5)

## 2016-07-04 LAB — COMPREHENSIVE METABOLIC PANEL
ALT: 40 U/L (ref 17–63)
ANION GAP: 10 (ref 5–15)
AST: 91 U/L — ABNORMAL HIGH (ref 15–41)
Albumin: 3.3 g/dL — ABNORMAL LOW (ref 3.5–5.0)
Alkaline Phosphatase: 129 U/L — ABNORMAL HIGH (ref 38–126)
BUN: 6 mg/dL (ref 6–20)
CHLORIDE: 102 mmol/L (ref 101–111)
CO2: 24 mmol/L (ref 22–32)
CREATININE: 0.7 mg/dL (ref 0.61–1.24)
Calcium: 8.8 mg/dL — ABNORMAL LOW (ref 8.9–10.3)
Glucose, Bld: 113 mg/dL — ABNORMAL HIGH (ref 65–99)
POTASSIUM: 3.2 mmol/L — AB (ref 3.5–5.1)
SODIUM: 136 mmol/L (ref 135–145)
Total Bilirubin: 1 mg/dL (ref 0.3–1.2)
Total Protein: 7.8 g/dL (ref 6.5–8.1)

## 2016-07-04 LAB — TROPONIN I

## 2016-07-04 LAB — URINALYSIS, ROUTINE W REFLEX MICROSCOPIC
Bilirubin Urine: NEGATIVE
Glucose, UA: NEGATIVE mg/dL
Hgb urine dipstick: NEGATIVE
Ketones, ur: 40 mg/dL — AB
LEUKOCYTES UA: NEGATIVE
NITRITE: NEGATIVE
PROTEIN: 30 mg/dL — AB
Specific Gravity, Urine: 1.022 (ref 1.005–1.030)
pH: 5.5 (ref 5.0–8.0)

## 2016-07-04 LAB — URINALYSIS, MICROSCOPIC (REFLEX)

## 2016-07-04 LAB — LIPASE, BLOOD: Lipase: 33 U/L (ref 11–51)

## 2016-07-04 IMAGING — CR DG CHEST 2V
2 series · 2 of 2 positions shown · non-contrast
Comparison: [DATE]

CLINICAL DATA: Chest tightness

EXAM:
CHEST  2 VIEW

[w chest pa]
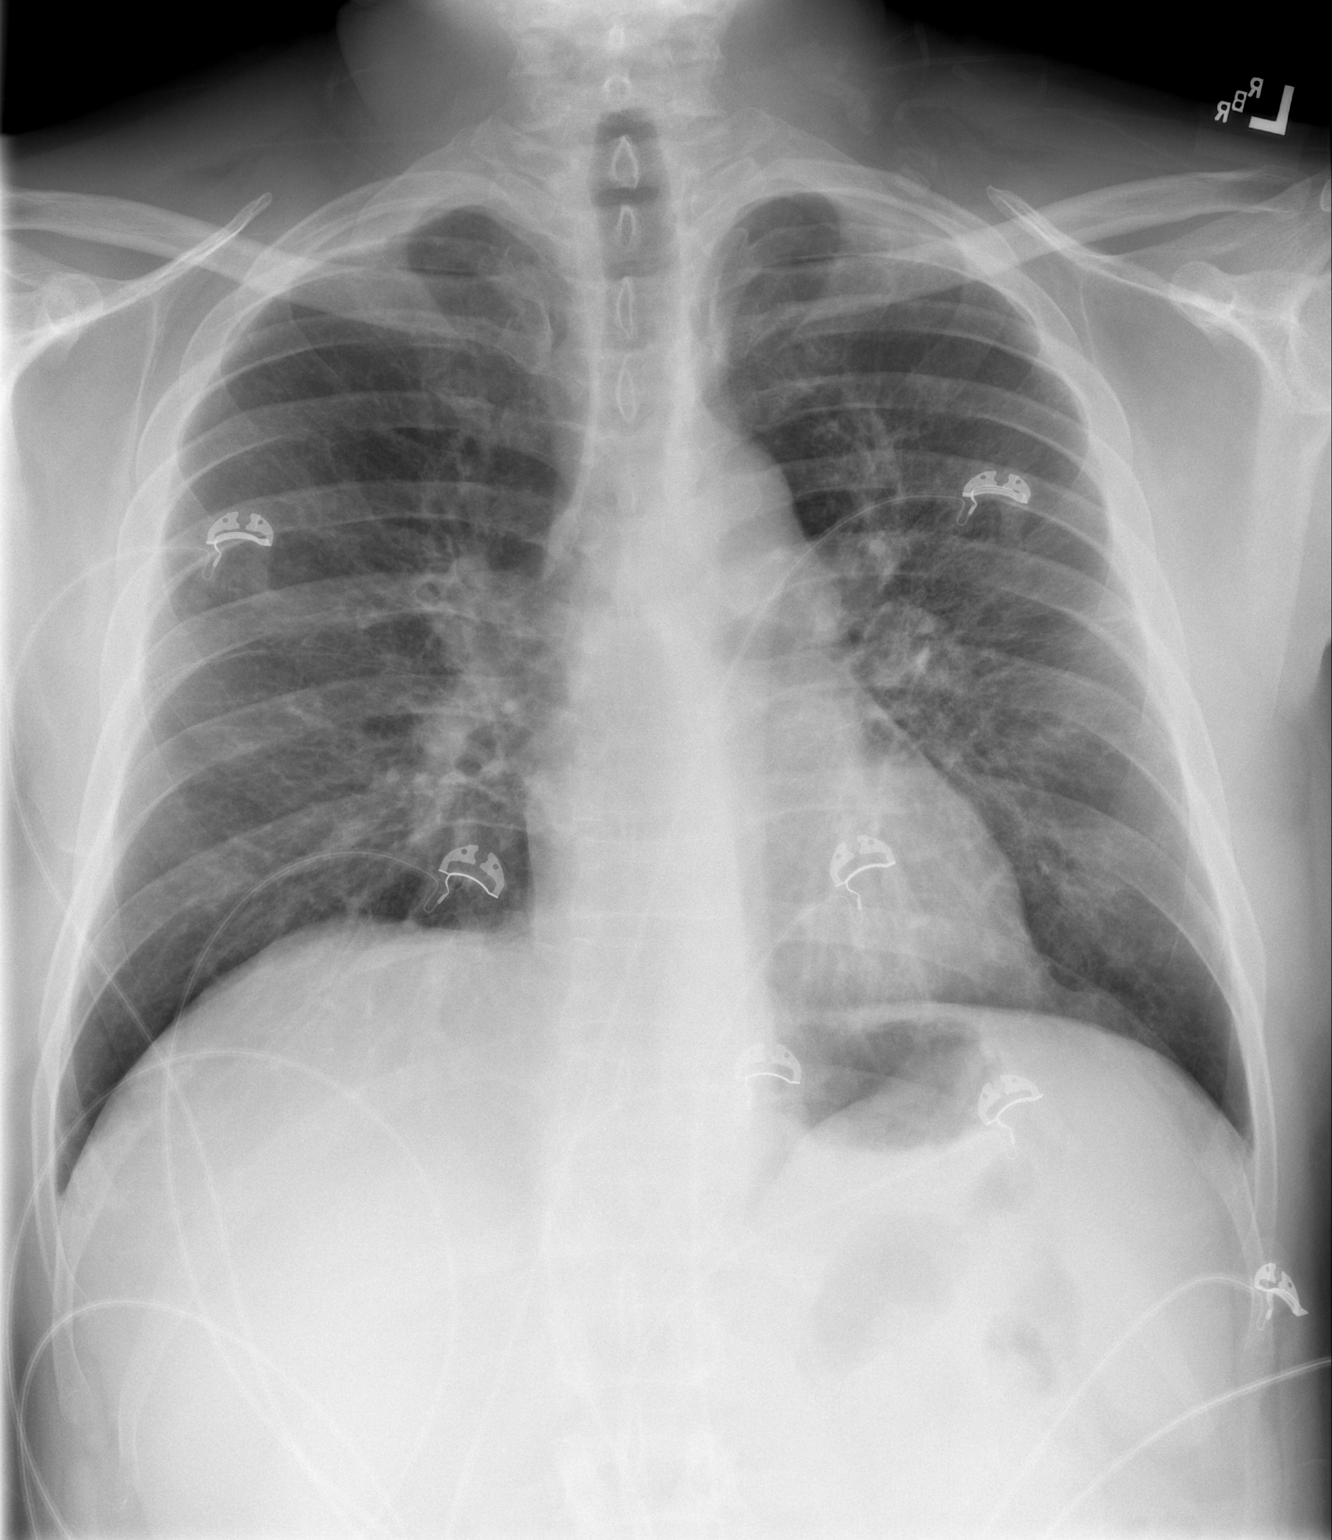

[w chest lat]
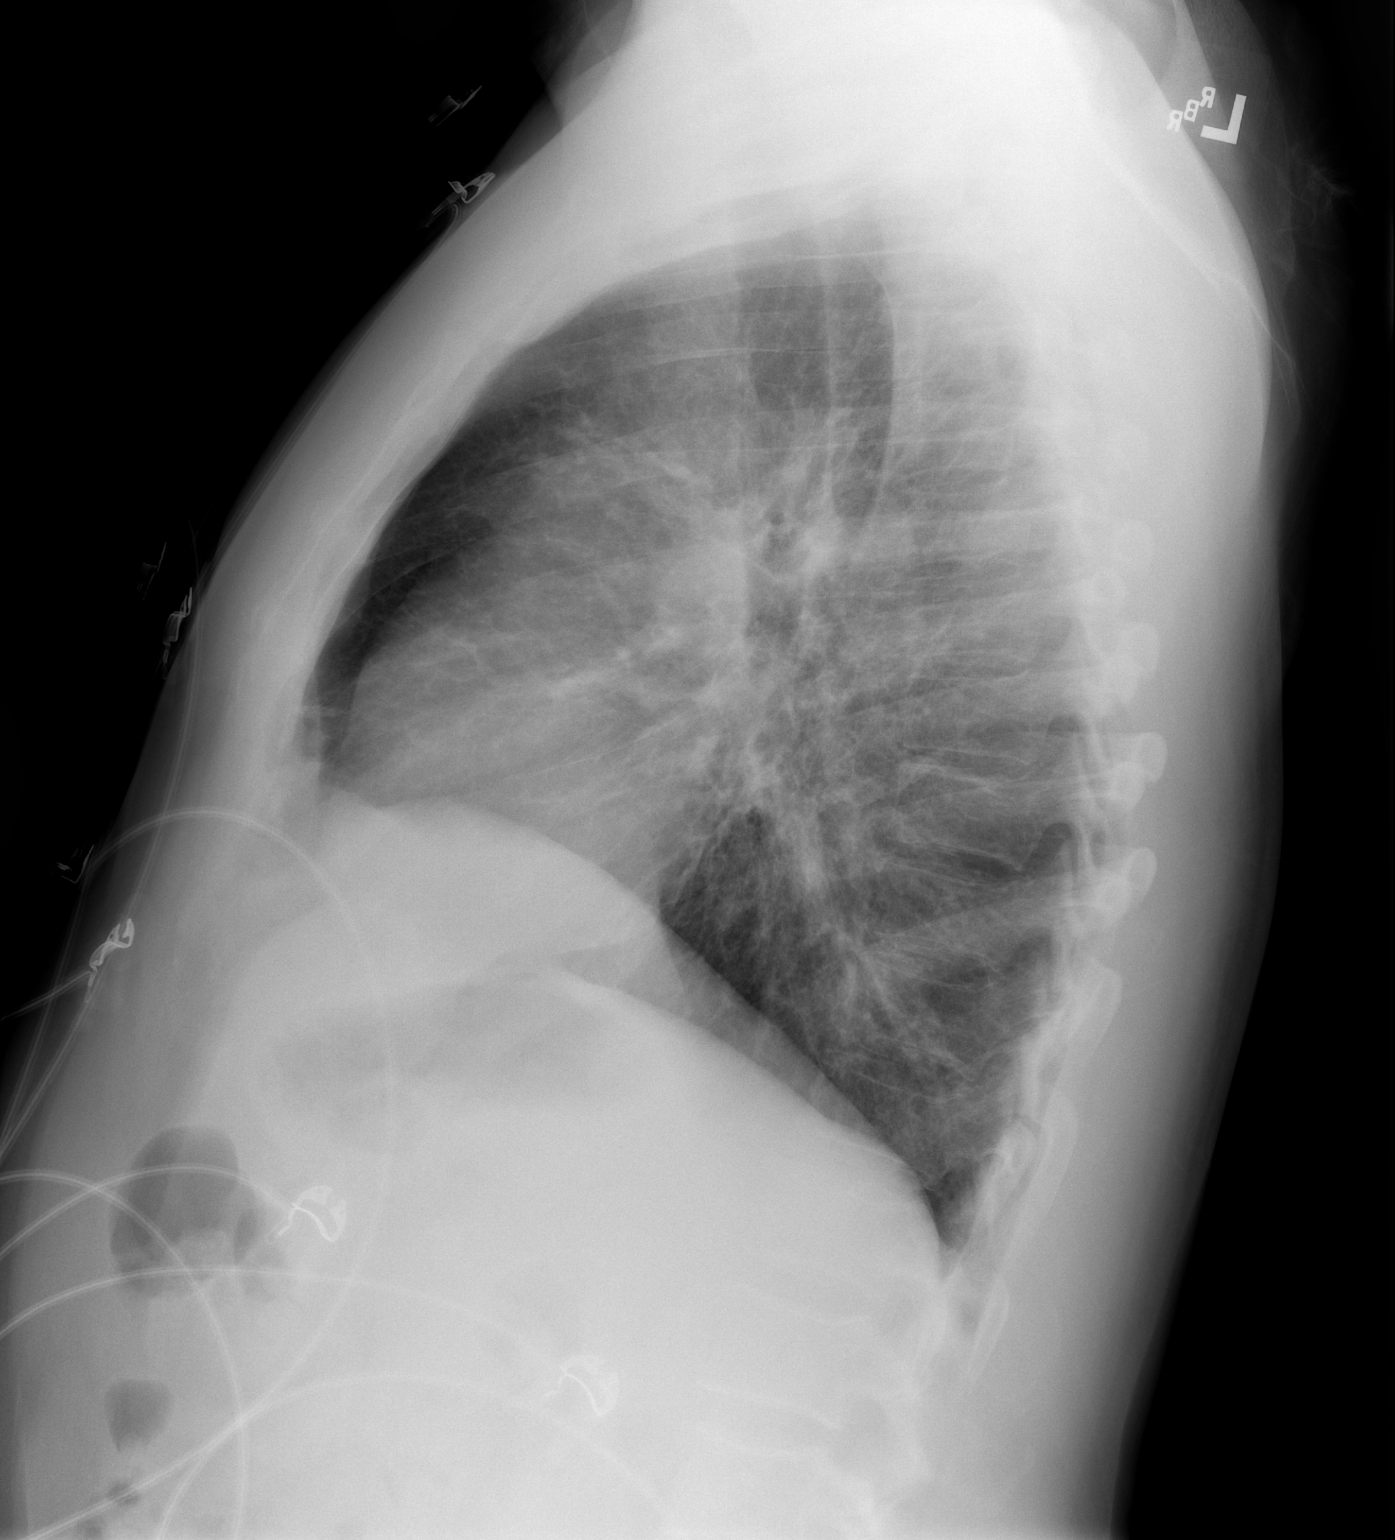

[2 of 2 positions shown; findings below may reference images not displayed]

FINDINGS: Chronic lung disease in this patient with airway thickening and
bronchiectasis on recent chest CT. No acute superimposed opacity. No
edema, effusion, or pneumothorax. Normal heart size and stable
mediastinal contours.
IMPRESSION: Chronic lung disease without acute superimposed finding.

## 2016-07-04 MED ORDER — SODIUM CHLORIDE 0.9 % IV BOLUS (SEPSIS)
1000.0000 mL | Freq: Once | INTRAVENOUS | Status: AC
  Administered 2016-07-04: 1000 mL via INTRAVENOUS

## 2016-07-04 MED ORDER — ONDANSETRON HCL 4 MG/2ML IJ SOLN
4.0000 mg | Freq: Once | INTRAMUSCULAR | Status: AC
  Administered 2016-07-04: 4 mg via INTRAVENOUS
  Filled 2016-07-04: qty 2

## 2016-07-04 MED ORDER — OMEPRAZOLE 20 MG PO CPDR: 20.0000 mg | DELAYED_RELEASE_CAPSULE | Freq: Every day | ORAL | 0 refills | Status: DC

## 2016-07-04 MED ORDER — MORPHINE SULFATE (PF) 4 MG/ML IV SOLN
4.0000 mg | Freq: Once | INTRAVENOUS | Status: AC
  Administered 2016-07-04: 4 mg via INTRAVENOUS
  Filled 2016-07-04: qty 1

## 2016-07-04 MED ORDER — GI COCKTAIL ~~LOC~~
30.0000 mL | Freq: Once | ORAL | Status: AC
  Administered 2016-07-04: 30 mL via ORAL
  Filled 2016-07-04: qty 30

## 2016-07-04 NOTE — ED Triage Notes (Addendum)
c/o abd pain since 1am after taking excedrin for a toothache-vomiting "dark blood" since 3am-NAD-steady gait-also added c/o intermittent CP

## 2016-07-04 NOTE — ED Provider Notes (Signed)
[facility] Provider Note   CSN: [id number] Arrival date & time: 07/04/16  1136     History   Chief Complaint Chief Complaint  Patient presents with  . Abdominal Pain    HPI Trevor Reilly is a 46 y.o. male presenting with sudden onset epigastric pain last night. He explains that he took a high dose of Excedrin (4) and went to sleep because of a toothache and woke up with epigastric pain that is sharp and non-radiating. He then experienced nausea and vomiting and saw some redness in his vomitus that made him think he had blood. He subsequently vomited dark brown liquid in his chest started to be tight. He reports chest tightness at this time. He also endorses chills but no fever, diarrhea, cold symptoms or other symptoms. He is currently taking prednisone for sarcoidosis and reports quitting drinking and smoking 2 weeks ago.  HPI  Past Medical History:  Diagnosis Date  . Allergy   . Bronchitis   . Depression   . GERD (gastroesophageal reflux disease)   . History of rectal bleeding   . Hydrocele   . Reflux   . Sarcoidosis   . Testicular tumor 2007    There are no active problems to display for this patient.   Past Surgical History:  Procedure Laterality Date  . arm surgery    . LUNG BIOPSY         Home Medications    Prior to Admission medications   Medication Sig Start Date End Date Taking? Authorizing Provider  amLODipine (NORVASC) 5 MG tablet Take 1 tablet (5 mg total) by mouth daily. 05/27/16   Saguier, Percell Miller, PA-C  omeprazole (PRILOSEC) 20 MG capsule Take 1 capsule (20 mg total) by mouth daily. 07/04/16   Avie Echevaria B, PA-C  potassium chloride (K-DUR) 10 MEQ tablet Take 1 tablet by mouth daily. For 5 days 05/18/16   [provider]  sildenafil (VIAGRA) 50 MG tablet Take 1 tablet (50 mg total) by mouth daily as needed for erectile dysfunction. Max one tablet per 24 hours. 06/19/16   Saguier, Percell Miller, PA-C    Family History Family  History  Problem Relation Age of Onset  . Gout Mother   . Hypertension Mother   . Thyroid disease Mother   . Stroke Father   . Heart attack Father   . Thyroid disease Sister   . Hypertension Brother   . Sarcoidosis Cousin     Social History Social History  Substance Use Topics  . Smoking status: Current Every Day Smoker    Packs/day: 1.00    Years: 25.00    Types: Cigarettes  . Smokeless tobacco: Never Used  . Alcohol use Yes     Comment: daily     Allergies   Patient has no known allergies.   Review of Systems Review of Systems  Constitutional: Positive for chills. Negative for fever.  HENT: Negative for congestion, ear pain, sore throat and trouble swallowing.   Respiratory: Positive for chest tightness. Negative for cough, shortness of breath, wheezing and stridor.   Cardiovascular: Negative for chest pain and palpitations.  Gastrointestinal: Positive for abdominal pain, nausea and vomiting. Negative for abdominal distention, blood in stool and diarrhea.  Genitourinary: Negative for difficulty urinating, dysuria, flank pain, frequency and hematuria.  Musculoskeletal: Negative for arthralgias, back pain, myalgias, neck pain and neck stiffness.  Skin: Negative for color change, pallor, rash and wound.  Neurological: Negative for dizziness, seizures, syncope and light-headedness.  Physical Exam Updated Vital Signs BP (!) 143/104 (BP Location: Left Arm)   Pulse 76   Temp 98.6 F (37 C) (Oral)   Resp 18   Ht 5\' 9"  (1.753 m)   Wt 94.3 kg   SpO2 96%   BMI 30.72 kg/m   Physical Exam  Constitutional: He appears well-developed and well-nourished. No distress.  Afebrile, nontoxic-appearing, lying comfortably in bed in no acute distress.  HENT:  Head: Normocephalic and atraumatic.  Mouth/Throat: Oropharynx is clear and moist. No oropharyngeal exudate.  Eyes: Conjunctivae and EOM are normal. Right eye exhibits no discharge. Left eye exhibits no discharge. No  scleral icterus.  Neck: Normal range of motion.  Cardiovascular: Normal rate, regular rhythm and normal heart sounds.   No murmur heard. Pulmonary/Chest: Effort normal and breath sounds normal. No respiratory distress. He has no wheezes. He has no rales.  Abdominal: Soft. Bowel sounds are normal. He exhibits no distension and no mass. There is no tenderness. There is no rebound and no guarding. No hernia.  Tenderness palpation of the epigastric region. Negative Murphy sign, negative McBurney's point tenderness.  Musculoskeletal: Normal range of motion. He exhibits no edema.  Neurological: He is alert.  Skin: Skin is warm and dry. He is not diaphoretic. No erythema. No pallor.  Patient has multiple skin lesions on his abdomen which are baseline for him from sarcoidosis  Psychiatric: He has a normal mood and affect.  Nursing note and vitals reviewed.    ED Treatments / Results  Labs (all labs ordered are listed, but only abnormal results are displayed) Labs Reviewed  CBC WITH DIFFERENTIAL/PLATELET - Abnormal; Notable for the following:       Result Value   Hemoglobin 17.2 (*)    MCHC 36.6 (*)    All other components within normal limits  COMPREHENSIVE METABOLIC PANEL - Abnormal; Notable for the following:    Potassium 3.2 (*)    Glucose, Bld 113 (*)    Calcium 8.8 (*)    Albumin 3.3 (*)    AST 91 (*)    Alkaline Phosphatase 129 (*)    All other components within normal limits  URINALYSIS, ROUTINE W REFLEX MICROSCOPIC - Abnormal; Notable for the following:    Color, Urine AMBER (*)    Ketones, ur 40 (*)    Protein, ur 30 (*)    All other components within normal limits  URINALYSIS, MICROSCOPIC (REFLEX) - Abnormal; Notable for the following:    Bacteria, UA FEW (*)    Squamous Epithelial / LPF 0-5 (*)    All other components within normal limits  TROPONIN I  LIPASE, BLOOD    EKG  EKG Interpretation  Date/Time:  Thursday Jul 04 2016 11:52:04 EDT Ventricular Rate:  69 PR  Interval:    QRS Duration: 85 QT Interval:  405 QTC Calculation: 434 R Axis:   70 Text Interpretation:  Sinus rhythm Borderline short PR interval Borderline T wave abnormalities Baseline wander in lead(s) II III aVF Normal sinus rhythm Confirmed by Thomasene Lot, [place] [phone]) on 07/04/2016 1:50:33 PM       Radiology Dg Chest 2 View  Result Date: 07/04/2016 CLINICAL DATA:  Chest tightness EXAM: CHEST  2 VIEW COMPARISON:  03/29/2016 FINDINGS: Chronic lung disease in this patient with airway thickening and bronchiectasis on recent chest CT. No acute superimposed opacity. No edema, effusion, or pneumothorax. Normal heart size and stable mediastinal contours. IMPRESSION: Chronic lung disease without acute superimposed finding. Electronically Signed   By: Angelica Chessman  Watts M.D.   On: 07/04/2016 13:10    Procedures Procedures (including critical care time)  Medications Ordered in ED Medications  morphine 4 MG/ML injection 4 mg (4 mg Intravenous Given 07/04/16 1321)  sodium chloride 0.9 % bolus 1,000 mL (0 mLs Intravenous Stopped 07/04/16 1422)  ondansetron (ZOFRAN) injection 4 mg (4 mg Intravenous Given 07/04/16 1316)  gi cocktail (Maalox,Lidocaine,Donnatal) (30 mLs Oral Given 07/04/16 1449)     Initial Impression / Assessment and Plan / ED Course  I have reviewed the triage vital signs and the nursing notes.  Pertinent labs & imaging results that were available during my care of the patient were reviewed by me and considered in my medical decision making (see chart for details).     Patient presents with epigastric discomfort, nausea, vomiting, chest tightness which he thought might be related to his retching.   Reassuring exam, labs are unremarkable, EKG unremarkable and unchanged from prior tracing. Negative troponin. Heart score 3: (due to risk factors and age 46). Low suspicion of cardiac origin of C/P in this patient.  Patient's pain managed while in ED and he reported improvement on  both reassessment and after GI cocktail. Successful PO trial.  He was already referred to GI from PCP for endoscopy and has an appointment scheduled. Advised follow up with GI for endoscopy. Initiated PPI and provided education to patient. Urged to continue to avoid smoking, alcohol, caffeine, spicy foods or any other offending agents. Patient understood, was stable, afebrile, nontoxic and ready for discharge. He will be contacting his PCP for follow up and to address hypertension.  Discussed strict return precautions and advised to return to the emergency department if experiencing any new or worsening symptoms. Instructions were understood and patient agreed with discharge plan.   Final Clinical Impressions(s) / ED Diagnoses   Final diagnoses:  Epigastric pain    New Prescriptions Discharge Medication List as of 07/04/2016  3:00 PM    START taking these medications   Details  omeprazole (PRILOSEC) 20 MG capsule Take 1 capsule (20 mg total) by mouth daily., Starting Thu 07/04/2016, Print         Avie Echevaria B, PA-C 07/04/16 1702    Macarthur Critchley, MD 07/09/16 [phone]

## 2016-07-04 NOTE — ED Notes (Signed)
Pt on monitor 

## 2016-07-04 NOTE — Discharge Instructions (Signed)
As discussed, avoid tobacco, alcohol, caffeine and NSAIDS. Contact your PCP and follow up with your GI appointment. Take omeprazole daily, 30 minutes before breakfast.  I suspect that you may have a peptic ulcer and would need an endoscopy to confirm.  Return to the ER, if you experience worsening symptoms, lightheadedness, weakness, or other new concerning symptoms in the meantime.

## 2016-07-04 NOTE — ED Notes (Signed)
Patient claimed that he had abdominal pain 3 weeks ago and was at [facility].  He had a prescription for Potassium, for acid reflux and something else per patient. Pt took Excedrin given by his brother for tooth ache.  He stated that the abdominal pain started after taking Excedrin.

## 2016-07-10 ENCOUNTER — Ambulatory Visit: Payer: BLUE CROSS/BLUE SHIELD | Admitting: Internal Medicine

## 2016-08-14 ENCOUNTER — Ambulatory Visit: Payer: BLUE CROSS/BLUE SHIELD | Admitting: Internal Medicine

## 2016-08-19 ENCOUNTER — Telehealth: Payer: Self-pay | Admitting: Medical

## 2016-08-19 ENCOUNTER — Encounter: Payer: Self-pay | Admitting: Medical

## 2016-08-19 NOTE — Telephone Encounter (Signed)
Pt missed Gi appointment for his abdomen pain history. I put in referral. Would still recommend he go. Does he want to reschedule or does he decline the referral?

## 2016-08-20 NOTE — Telephone Encounter (Signed)
Tried to reach pt. Pt phone is disconnected.

## 2016-11-17 ENCOUNTER — Emergency Department (HOSPITAL_COMMUNITY)
Admission: EM | Admit: 2016-11-17 | Discharge: 2016-11-17 | Discharge status: Against Medical Advice | Payer: BLUE CROSS/BLUE SHIELD | Attending: Emergency Medicine | Admitting: Emergency Medicine

## 2016-11-17 NOTE — ED Triage Notes (Signed)
Called to tx area x 3

## 2017-01-26 ENCOUNTER — Encounter (HOSPITAL_BASED_OUTPATIENT_CLINIC_OR_DEPARTMENT_OTHER): Payer: Self-pay | Admitting: Emergency Medicine

## 2017-01-26 ENCOUNTER — Emergency Department (HOSPITAL_BASED_OUTPATIENT_CLINIC_OR_DEPARTMENT_OTHER)
Admission: EM | Admit: 2017-01-26 | Discharge: 2017-01-26 | Disposition: A | Discharge status: Home / Self Care | Payer: No Typology Code available for payment source | Attending: Emergency Medicine | Admitting: Emergency Medicine

## 2017-01-26 ENCOUNTER — Other Ambulatory Visit: Payer: Self-pay

## 2017-01-26 ENCOUNTER — Emergency Department (HOSPITAL_BASED_OUTPATIENT_CLINIC_OR_DEPARTMENT_OTHER): Payer: No Typology Code available for payment source

## 2017-01-26 DIAGNOSIS — Y9389 Activity, other specified: Secondary | ICD-10-CM | POA: Insufficient documentation

## 2017-01-26 DIAGNOSIS — F17210 Nicotine dependence, cigarettes, uncomplicated: Secondary | ICD-10-CM | POA: Insufficient documentation

## 2017-01-26 DIAGNOSIS — Z79899 Other long term (current) drug therapy: Secondary | ICD-10-CM | POA: Diagnosis not present

## 2017-01-26 DIAGNOSIS — S161XXA Strain of muscle, fascia and tendon at neck level, initial encounter: Secondary | ICD-10-CM | POA: Insufficient documentation

## 2017-01-26 DIAGNOSIS — Y998 Other external cause status: Secondary | ICD-10-CM | POA: Diagnosis not present

## 2017-01-26 DIAGNOSIS — Y92410 Unspecified street and highway as the place of occurrence of the external cause: Secondary | ICD-10-CM | POA: Insufficient documentation

## 2017-01-26 DIAGNOSIS — S39012A Strain of muscle, fascia and tendon of lower back, initial encounter: Secondary | ICD-10-CM

## 2017-01-26 DIAGNOSIS — S199XXA Unspecified injury of neck, initial encounter: Secondary | ICD-10-CM | POA: Diagnosis present
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
## 2017-01-26 MED ORDER — IBUPROFEN 800 MG PO TABS
800.0000 mg | ORAL_TABLET | Freq: Once | ORAL | Status: AC
  Administered 2017-01-26: 800 mg via ORAL
  Filled 2017-01-26: qty 1

## 2017-01-26 MED ORDER — CYCLOBENZAPRINE HCL 5 MG PO TABS: 5.0000 mg | ORAL_TABLET | Freq: Three times a day (TID) | ORAL | 0 refills | Status: DC | PRN
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
## 2017-01-26 NOTE — ED Notes (Signed)
ED Provider at bedside. 
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
## 2017-01-26 NOTE — ED Notes (Signed)
Pt given d/c instructions as per chart. Verbalizes understanding. No questions. Rx x 1 with precautions 

## 2017-03-25 ENCOUNTER — Ambulatory Visit: Payer: Self-pay | Admitting: Internal Medicine

## 2018-03-30 ENCOUNTER — Encounter (HOSPITAL_BASED_OUTPATIENT_CLINIC_OR_DEPARTMENT_OTHER): Payer: Self-pay

## 2018-03-30 ENCOUNTER — Other Ambulatory Visit: Payer: Self-pay

## 2018-03-30 ENCOUNTER — Emergency Department (HOSPITAL_BASED_OUTPATIENT_CLINIC_OR_DEPARTMENT_OTHER)
Admission: EM | Admit: 2018-03-30 | Discharge: 2018-03-30 | Disposition: A | Discharge status: Home / Self Care | Payer: Self-pay | Attending: Emergency Medicine | Admitting: Emergency Medicine

## 2018-03-30 ENCOUNTER — Emergency Department (HOSPITAL_BASED_OUTPATIENT_CLINIC_OR_DEPARTMENT_OTHER): Payer: Self-pay

## 2018-03-30 DIAGNOSIS — F17210 Nicotine dependence, cigarettes, uncomplicated: Secondary | ICD-10-CM | POA: Insufficient documentation

## 2018-03-30 DIAGNOSIS — R0789 Other chest pain: Secondary | ICD-10-CM | POA: Insufficient documentation

## 2018-03-30 DIAGNOSIS — R1084 Generalized abdominal pain: Secondary | ICD-10-CM | POA: Insufficient documentation

## 2018-03-30 DIAGNOSIS — R05 Cough: Secondary | ICD-10-CM | POA: Insufficient documentation

## 2018-03-30 DIAGNOSIS — M791 Myalgia, unspecified site: Secondary | ICD-10-CM | POA: Insufficient documentation

## 2018-03-30 DIAGNOSIS — R110 Nausea: Secondary | ICD-10-CM

## 2018-03-30 DIAGNOSIS — R0602 Shortness of breath: Secondary | ICD-10-CM | POA: Insufficient documentation

## 2018-03-30 DIAGNOSIS — R52 Pain, unspecified: Secondary | ICD-10-CM

## 2018-03-30 DIAGNOSIS — R112 Nausea with vomiting, unspecified: Secondary | ICD-10-CM | POA: Insufficient documentation

## 2018-03-30 DIAGNOSIS — R059 Cough, unspecified: Secondary | ICD-10-CM

## 2018-03-30 DIAGNOSIS — Z79899 Other long term (current) drug therapy: Secondary | ICD-10-CM | POA: Insufficient documentation

## 2018-03-30 DIAGNOSIS — J029 Acute pharyngitis, unspecified: Secondary | ICD-10-CM | POA: Insufficient documentation

## 2018-03-30 DIAGNOSIS — R079 Chest pain, unspecified: Secondary | ICD-10-CM

## 2018-03-30 DIAGNOSIS — R0981 Nasal congestion: Secondary | ICD-10-CM | POA: Insufficient documentation

## 2018-03-30 LAB — COMPREHENSIVE METABOLIC PANEL
ALBUMIN: 3.2 g/dL — AB (ref 3.5–5.0)
ALK PHOS: 184 U/L — AB (ref 38–126)
ALT: 38 U/L (ref 0–44)
AST: 116 U/L — ABNORMAL HIGH (ref 15–41)
Anion gap: 10 (ref 5–15)
BILIRUBIN TOTAL: 0.7 mg/dL (ref 0.3–1.2)
BUN: 5 mg/dL — ABNORMAL LOW (ref 6–20)
CO2: 25 mmol/L (ref 22–32)
Calcium: 8.4 mg/dL — ABNORMAL LOW (ref 8.9–10.3)
Chloride: 101 mmol/L (ref 98–111)
Creatinine, Ser: 0.81 mg/dL (ref 0.61–1.24)
GFR calc Af Amer: >60 mL/min (ref >60)
GFR calc non Af Amer: >60 mL/min (ref >60)
Glucose, Bld: 95 mg/dL (ref 70–99)
Potassium: 3.3 mmol/L — ABNORMAL LOW (ref 3.5–5.1)
Sodium: 136 mmol/L (ref 135–145)
Total Protein: 8.5 g/dL — ABNORMAL HIGH (ref 6.5–8.1)

## 2018-03-30 LAB — CBC WITH DIFFERENTIAL/PLATELET
Abs Immature Granulocytes: 0.01 10*3/uL (ref 0.00–0.07)
Basophils Absolute: 0.1 10*3/uL (ref 0.0–0.1)
Basophils Relative: 1 %
EOS PCT: 0 %
Eosinophils Absolute: 0 10*3/uL (ref 0.0–0.5)
HEMATOCRIT: 49.7 % (ref 39.0–52.0)
Hemoglobin: 16.3 g/dL (ref 13.0–17.0)
IMMATURE GRANULOCYTES: 0 %
LYMPHS ABS: 1.1 10*3/uL (ref 0.7–4.0)
Lymphocytes Relative: 17 %
MCH: 31 pg (ref 26.0–34.0)
MCHC: 32.8 g/dL (ref 30.0–36.0)
MCV: 94.7 fL (ref 80.0–100.0)
MONO ABS: 0.6 10*3/uL (ref 0.1–1.0)
MONOS PCT: 9 %
Neutro Abs: 4.7 10*3/uL (ref 1.7–7.7)
Neutrophils Relative %: 73 %
Platelets: 169 10*3/uL (ref 150–400)
RBC: 5.25 MIL/uL (ref 4.22–5.81)
RDW: 14.1 % (ref 11.5–15.5)
WBC: 6.5 10*3/uL (ref 4.0–10.5)
nRBC: 0 % (ref 0.0–0.2)

## 2018-03-30 LAB — TROPONIN I: Troponin I: <0.03 ng/mL (ref <0.03)

## 2018-03-30 IMAGING — CR DG CHEST 2V
2 series · 2 of 2 positions shown · non-contrast
Comparison: [DATE]

CLINICAL DATA: Chest pain.  History of sarcoidosis.

EXAM:
CHEST - 2 VIEW

[w chest pa]
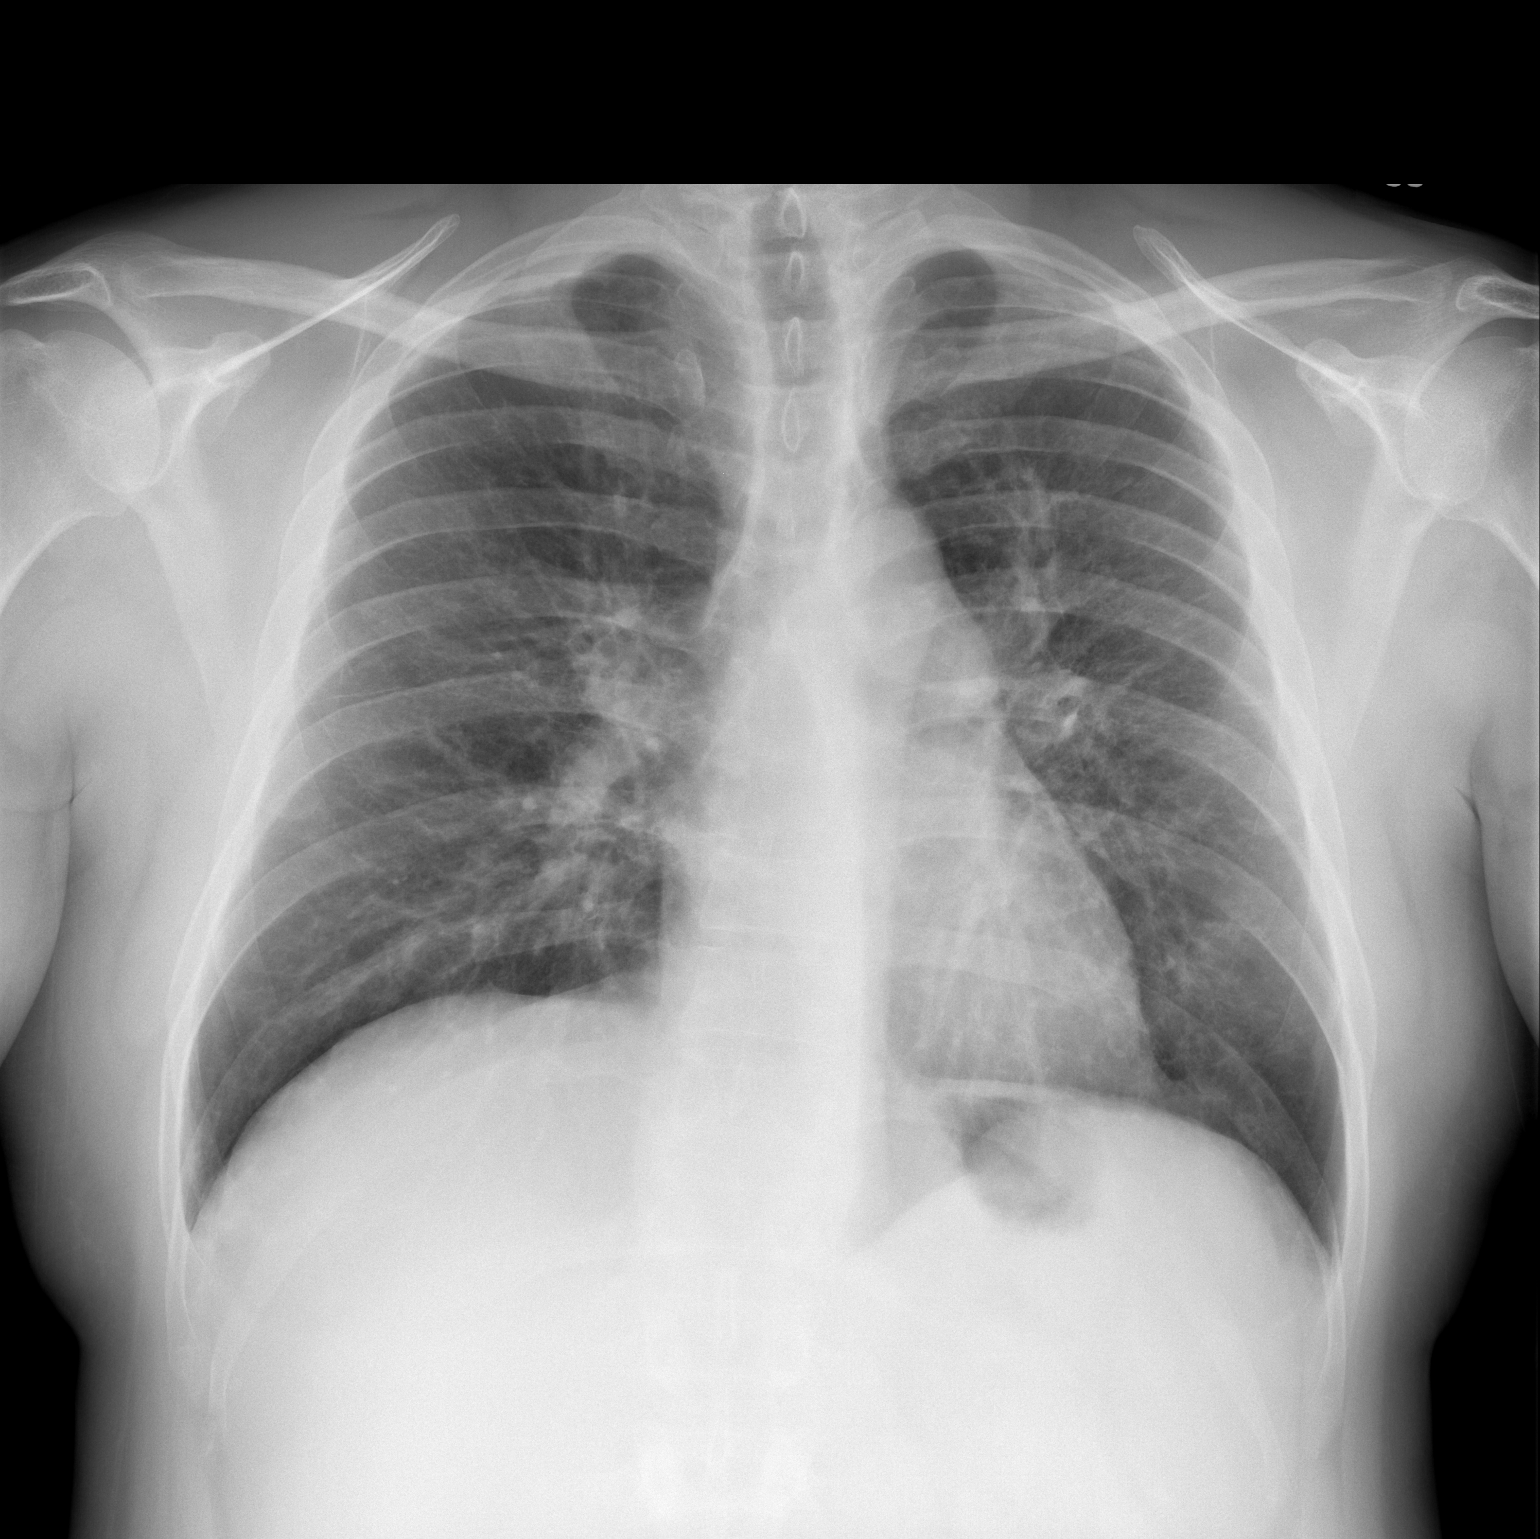

[w chest lat]
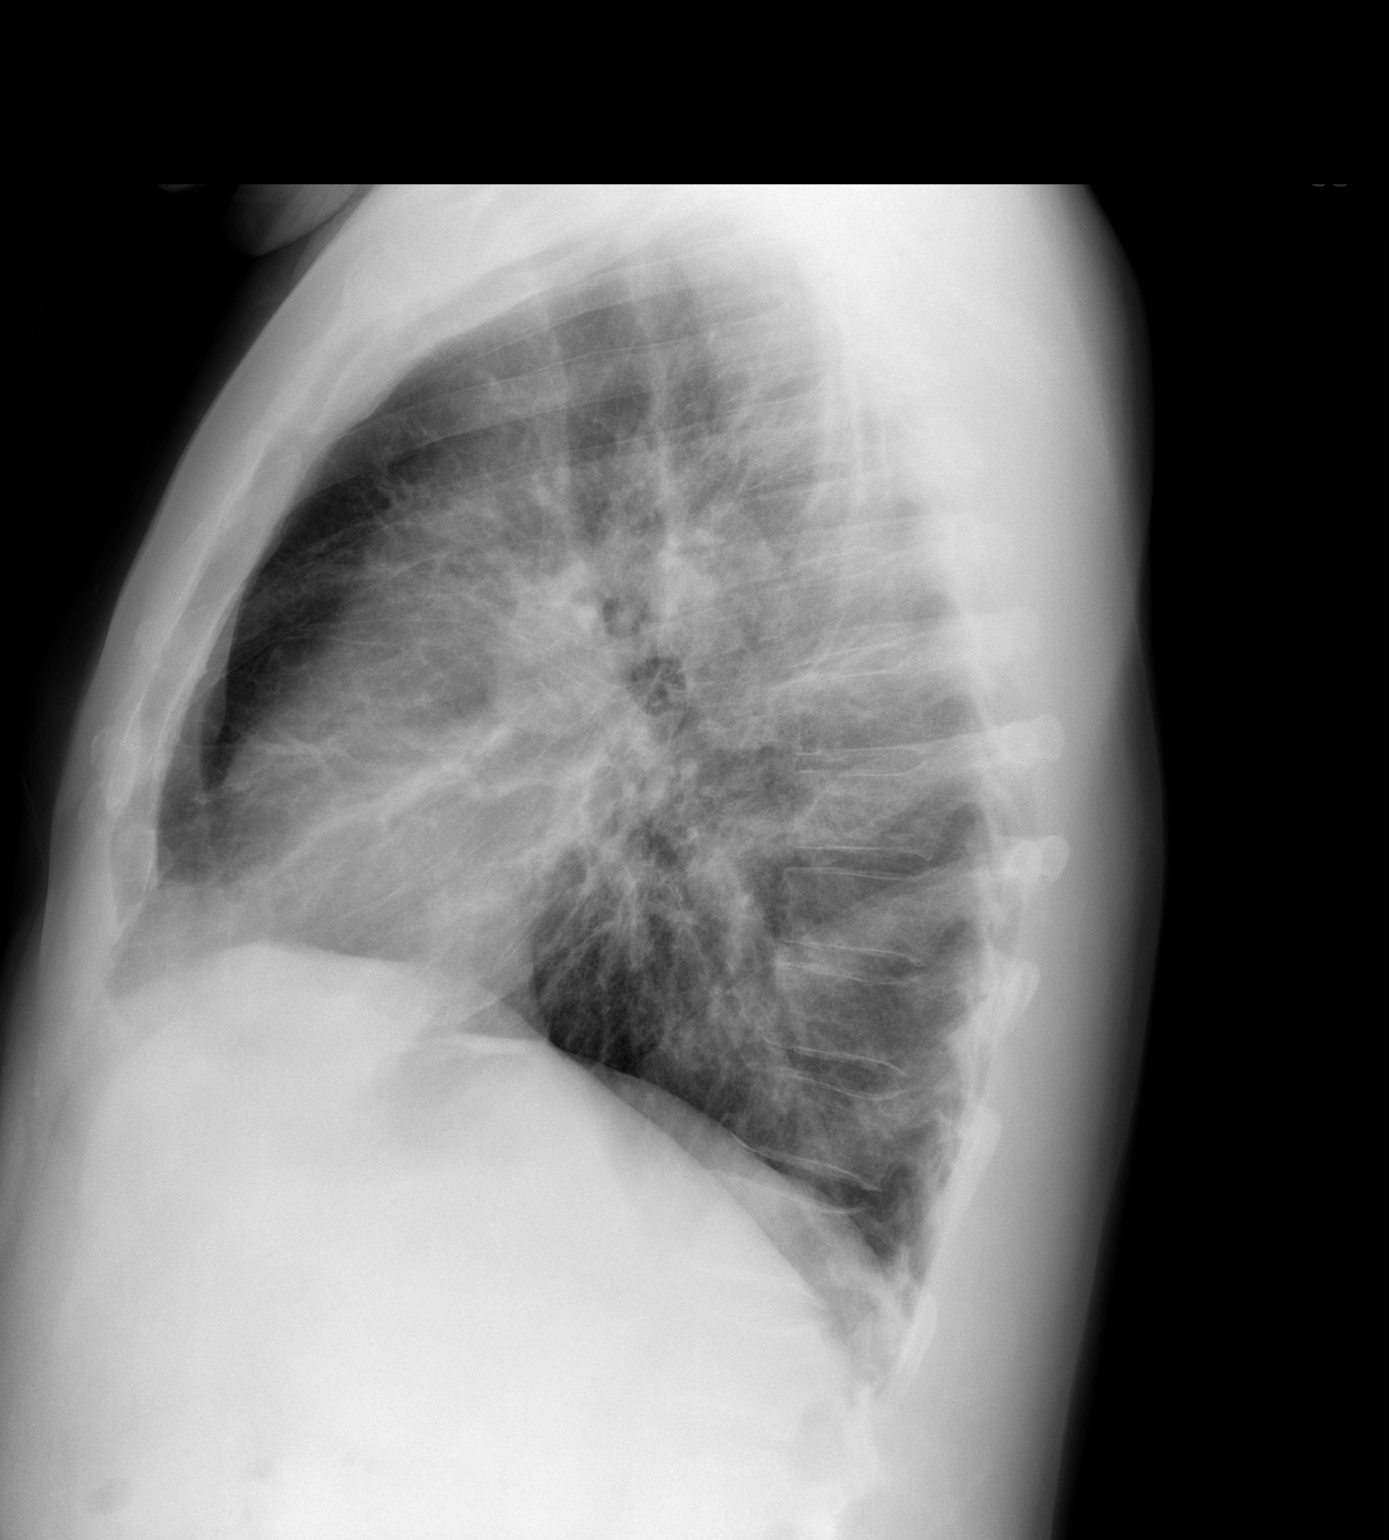

[2 of 2 positions shown; findings below may reference images not displayed]

FINDINGS: Chronic lung disease with airway thickening and bronchiectasis, seen
on previous imaging without significant change. Mild increased
interstitial markings, particularly in the left perihilar region,
also stable. No pneumothorax. The cardiomediastinal silhouette is
unchanged. No new focal infiltrate.
IMPRESSION: Chronic changes in the lungs consistent with the patient's known
sarcoidosis. No definitive acute infiltrate.

## 2018-03-30 MED ORDER — ONDANSETRON 4 MG PO TBDP
4.0000 mg | ORAL_TABLET | Freq: Once | ORAL | Status: AC
Start: 2018-03-30 — End: 2018-03-30
  Administered 2018-03-30: 4 mg via ORAL
  Filled 2018-03-30: qty 1

## 2018-03-30 MED ORDER — ONDANSETRON 4 MG PO TBDP: 4.0000 mg | ORAL_TABLET | Freq: Three times a day (TID) | ORAL | 0 refills | Status: DC | PRN

## 2018-03-30 MED ORDER — SODIUM CHLORIDE 0.9 % IV BOLUS: 1000.0000 mL | Freq: Once | INTRAVENOUS | Status: DC

## 2018-03-30 MED ORDER — IBUPROFEN 800 MG PO TABS
800.0000 mg | ORAL_TABLET | Freq: Once | ORAL | Status: AC
  Administered 2018-03-30: 800 mg via ORAL
  Filled 2018-03-30: qty 1

## 2018-03-30 MED ORDER — BENZONATATE 100 MG PO CAPS: 100.0000 mg | ORAL_CAPSULE | Freq: Three times a day (TID) | ORAL | 0 refills | Status: DC | PRN

## 2018-03-30 MED ORDER — ONDANSETRON HCL 4 MG/2ML IJ SOLN: 4.0000 mg | Freq: Once | INTRAMUSCULAR | Status: DC

## 2018-03-30 NOTE — ED Triage Notes (Signed)
Pt c/o congestion, sore throat, cough and body aches for the last few days, states it got worse today with an episode of vomiting

## 2018-03-30 NOTE — Discharge Instructions (Signed)
It was my pleasure taking care of you today!   As we discussed, please follow-up with either your pulmonologist or your primary care doctor.  I would like you to call them first thing in the morning to schedule this appointment.  Zofran as needed for nausea/vomiting.  Tessalon as needed for cough.  Stay hydrated.  Return to the emergency department for new or worsening symptoms, any additional concerns.

## 2018-03-30 NOTE — ED Notes (Signed)
Pt given a cup of ginger ale. Tolerating PO fluid at this time.

## 2018-03-30 NOTE — ED Notes (Signed)
ED Provider at bedside. 

## 2018-03-30 NOTE — ED Notes (Signed)
Patient transported to X-ray 

## 2018-03-30 NOTE — ED Provider Notes (Signed)
[name] EMER[facility] DEPARTMENT Provider Note   CSN: [id number] Arrival date & time: 03/30/18  1802     History   Chief Complaint Chief Complaint  Patient presents with  . Flu-Like Illness    HPI Trevor Reilly is a 48 y.o. male.  The history is provided by medical records. No language interpreter was used.  Trevor Reilly is a 48 y.o. male  with a PMH of sarcoidosis who presents to the Emergency Department complaining of nausea and vomiting which began about 2:00 today.  Patient states that last week he had cough, congestion, sore throat and body aches.  He gets flares of his sarcoidosis fairly frequently, and assumed this was another flare of his sarcoid.  His symptoms have been improving.  He still has a persistent cough, but otherwise felt as if he was getting better.  Suddenly today, he got very sweaty and nauseous.  He did have an episode of emesis.  He still feels nauseous.  Denies any abdominal pain.  He had diarrhea one day last week, but has not had any today.  He is also having pain across entire chest wall.  Pain has been constant since 2 PM without alleviating or aggravating factors.  It does not seem to get worse with exertion.  He does endorse some shortness of breath with his coughing fits.  Does not feel short of breath currently.  Denies any known sick contacts.   Past Medical History:  Diagnosis Date  . Allergy   . Bronchitis   . Depression   . ED (erectile dysfunction)   . Fatty liver   . History of rectal bleeding   . Hydrocele   . Reflux   . Sarcoidosis   . Testicular tumor 2007    There are no active problems to display for this patient.   Past Surgical History:  Procedure Laterality Date  . arm surgery    . LUNG BIOPSY          Home Medications    Prior to Admission medications   Medication Sig Start Date End Date Taking? Authorizing Provider  amLODipine (NORVASC) 5 MG tablet Take 1 tablet (5 mg total) by mouth daily. 05/27/16    Saguier, Percell Miller, PA-C  benzonatate (TESSALON) 100 MG capsule Take 1 capsule (100 mg total) by mouth 3 (three) times daily as needed for cough. 03/30/18   Artavious Trebilcock, Ozella Almond, PA-C  cyclobenzaprine (FLEXERIL) 5 MG tablet Take 1 tablet (5 mg total) by mouth 3 (three) times daily as needed for muscle spasms. 01/26/17   Davonna Belling, MD  omeprazole (PRILOSEC) 20 MG capsule Take 1 capsule (20 mg total) by mouth daily. 07/04/16   Avie Echevaria B, PA-C  ondansetron (ZOFRAN ODT) 4 MG disintegrating tablet Take 1 tablet (4 mg total) by mouth every 8 (eight) hours as needed for nausea or vomiting. 03/30/18   Sequita Wise, Ozella Almond, PA-C  potassium chloride (K-DUR) 10 MEQ tablet Take 1 tablet by mouth daily. For 5 days 05/18/16   [provider]  sildenafil (VIAGRA) 50 MG tablet Take 1 tablet (50 mg total) by mouth daily as needed for erectile dysfunction. Max one tablet per 24 hours. 06/19/16   Saguier, Percell Miller, PA-C    Family History Family History  Problem Relation Age of Onset  . Gout Mother   . Hypertension Mother   . Thyroid disease Mother   . Stroke Father   . Heart attack Father   . Thyroid disease Sister   . Hypertension  Brother   . Sarcoidosis Cousin     Social History Social History   Tobacco Use  . Smoking status: Current Every Day Smoker    Packs/day: 1.00    Years: 25.00    Pack years: 25.00    Types: Cigarettes  . Smokeless tobacco: Never Used  Substance Use Topics  . Alcohol use: Yes    Comment: daily  . Drug use: No     Allergies   Patient has no known allergies.   Review of Systems Review of Systems  Constitutional: Negative for appetite change, chills and fever.  HENT: Positive for congestion and sore throat (Resolved).   Respiratory: Positive for cough and shortness of breath.   Cardiovascular: Positive for chest pain.  Gastrointestinal: Positive for nausea and vomiting. Negative for abdominal pain and diarrhea.  All other systems reviewed and are  negative.    Physical Exam Updated Vital Signs BP (!) 145/102 (BP Location: Right Arm)   Pulse 77   Temp 98.6 F (37 C) (Oral)   Resp 18   Ht _0  (1.753 m)   Wt 95.3 kg   SpO2 97%   BMI 31.01 kg/m   Physical Exam Vitals signs and nursing note reviewed.  Constitutional:      General: He is not in acute distress.    Appearance: He is well-developed.  HENT:     Head: Normocephalic and atraumatic.  Neck:     Musculoskeletal: Neck supple.  Cardiovascular:     Rate and Rhythm: Normal rate and regular rhythm.     Heart sounds: Normal heart sounds. No murmur.  Pulmonary:     Effort: Pulmonary effort is normal. No respiratory distress.     Breath sounds: Normal breath sounds.     Comments: Lungs clear to auscultation bilaterally. Abdominal:     General: There is no distension.     Palpations: Abdomen is soft.     Comments: Mild generalized abdominal tenderness without rebound or guarding.  No focal areas of tenderness.  Skin:    General: Skin is warm and dry.  Neurological:     Mental Status: He is alert and oriented to person, place, and time.      ED Treatments / Results  Labs (all labs ordered are listed, but only abnormal results are displayed) Labs Reviewed  COMPREHENSIVE METABOLIC PANEL - Abnormal; Notable for the following components:      Result Value   Potassium 3.3 (*)    BUN 5 (*)    Calcium 8.4 (*)    Total Protein 8.5 (*)    Albumin 3.2 (*)    AST 116 (*)    Alkaline Phosphatase 184 (*)    All other components within normal limits  CBC WITH DIFFERENTIAL/PLATELET  TROPONIN I    EKG EKG Interpretation  Date/Time:  Monday March 30 2018 18:52:22 EST Ventricular Rate:  76 PR Interval:  112 QRS Duration: 88 QT Interval:  410 QTC Calculation: 461 R Axis:   69 Text Interpretation:  Normal sinus rhythm Normal ECG ST changes similar to may 2018 No acute changes Confirmed by Merrily Pew [phone]) on 03/30/2018 7:40:37 PM   Radiology Dg Chest 2  View  Result Date: 03/30/2018 CLINICAL DATA:  Chest pain.  History of sarcoidosis. EXAM: CHEST - 2 VIEW COMPARISON:  Jul 04, 2016 FINDINGS: Chronic lung disease with airway thickening and bronchiectasis, seen on previous imaging without significant change. Mild increased interstitial markings, particularly in the left perihilar region, also stable. No  pneumothorax. The cardiomediastinal silhouette is unchanged. No new focal infiltrate. IMPRESSION: Chronic changes in the lungs consistent with the patient's known sarcoidosis. No definitive acute infiltrate. Electronically Signed   By: Dorise Bullion III M.D   On: 03/30/2018 18:53    Procedures Procedures (including critical care time)  Medications Ordered in ED Medications  ondansetron (ZOFRAN-ODT) disintegrating tablet 4 mg (4 mg Oral Given 03/30/18 1912)  ibuprofen (ADVIL,MOTRIN) tablet 800 mg (800 mg Oral Given 03/30/18 1939)     Initial Impression / Assessment and Plan / ED Course  I have reviewed the triage vital signs and the nursing notes.  Pertinent labs & imaging results that were available during my care of the patient were reviewed by me and considered in my medical decision making (see chart for details).    Trevor Reilly is a 48 y.o. male who presents to ED for persistent cough, congestion for the last week with nausea and one episode of emesis that occurred today.  With episode of emesis, he did have chest pain radiating across his entire chest wall.  This was reproducible on exam.  It is not exertional and history does not sound cardiac in nature.  EKG with no acute ischemic changes today.  Troponin wdl.  Chest x-ray without acute findings.  No pneumonia.  Labs reviewed and reassuring.  Baseline elevation in AST and alk phos.  On exam, patient is afebrile, hemodynamically stable with a nonsurgical abdomen and clear lungs.  On repeat evaluation, nausea improved.  He is tolerating p.o. without difficulty.  He feels as if he could go home.   He is to call his doctor in the morning to schedule a follow-up appointment.  We discussed reasons to return to the emergency department at length.  All questions answered.   Final Clinical Impressions(s) / ED Diagnoses   Final diagnoses:  Chest pain  Nausea  Body aches  Cough    ED Discharge Orders         Ordered    benzonatate (TESSALON) 100 MG capsule  3 times daily PRN     03/30/18 1952    ondansetron (ZOFRAN ODT) 4 MG disintegrating tablet  Every 8 hours PRN     03/30/18 1952           Fortunata Betty, Ozella Almond, PA-C 03/30/18 1957    Merrily Pew, MD 03/31/18 [phone]

## 2018-06-21 ENCOUNTER — Other Ambulatory Visit: Payer: Self-pay

## 2018-06-21 ENCOUNTER — Encounter (HOSPITAL_BASED_OUTPATIENT_CLINIC_OR_DEPARTMENT_OTHER): Payer: Self-pay | Admitting: Emergency Medicine

## 2018-06-21 ENCOUNTER — Emergency Department (HOSPITAL_BASED_OUTPATIENT_CLINIC_OR_DEPARTMENT_OTHER)
Admission: EM | Admit: 2018-06-21 | Discharge: 2018-06-21 | Disposition: A | Discharge status: Home / Self Care | Payer: Self-pay | Attending: Emergency Medicine | Admitting: Emergency Medicine

## 2018-06-21 DIAGNOSIS — K029 Dental caries, unspecified: Secondary | ICD-10-CM | POA: Insufficient documentation

## 2018-06-21 DIAGNOSIS — Z87891 Personal history of nicotine dependence: Secondary | ICD-10-CM | POA: Insufficient documentation

## 2018-06-21 DIAGNOSIS — Z79899 Other long term (current) drug therapy: Secondary | ICD-10-CM | POA: Insufficient documentation

## 2018-06-21 DIAGNOSIS — K0381 Cracked tooth: Secondary | ICD-10-CM | POA: Insufficient documentation

## 2018-06-21 DIAGNOSIS — K047 Periapical abscess without sinus: Secondary | ICD-10-CM | POA: Insufficient documentation

## 2018-06-21 MED ORDER — HYDROCODONE-ACETAMINOPHEN 5-325 MG PO TABS: 1.0000 | ORAL_TABLET | Freq: Four times a day (QID) | ORAL | 0 refills | Status: DC | PRN

## 2018-06-21 MED ORDER — AMOXICILLIN 500 MG PO CAPS: 500.0000 mg | ORAL_CAPSULE | Freq: Three times a day (TID) | ORAL | 0 refills | Status: DC

## 2018-06-21 NOTE — ED Provider Notes (Signed)
[facility] EM[facility] DEPARTMENT Provider Note   CSN: [id number] Arrival date & time: 06/21/18  4782    History   Chief Complaint Chief Complaint  Patient presents with  . Dental Pain    HPI Trevor Reilly is a 48 y.o. male.     The history is provided by the patient.  Dental Pain  Location:  Lower Lower teeth location:  31/RL 2nd molar Quality:  Throbbing, sharp, shooting, pulsating and constant Severity:  Severe Onset quality:  Gradual Duration:  1 week Timing:  Constant Progression:  Worsening Chronicity:  New Context: dental caries and poor dentition   Relieved by:  Nothing Worsened by:  Cold food/drink, hot food/drink, jaw movement and touching Ineffective treatments:  NSAIDs Associated symptoms: facial pain and gum swelling   Associated symptoms: no difficulty swallowing, no drooling, no facial swelling, no fever, no neck pain, no neck swelling, no oral lesions and no trismus   Risk factors: periodontal disease   Risk factors comment:  Drinks alcohol daily and former smoker   Past Medical History:  Diagnosis Date  . Allergy   . Bronchitis   . Depression   . ED (erectile dysfunction)   . Fatty liver   . History of rectal bleeding   . Hydrocele   . Reflux   . Sarcoidosis   . Testicular tumor 2007    There are no active problems to display for this patient.   Past Surgical History:  Procedure Laterality Date  . arm surgery    . LUNG BIOPSY          Home Medications    Prior to Admission medications   Medication Sig Start Date End Date Taking? Authorizing Provider  albuterol (VENTOLIN HFA) 108 (90 Base) MCG/ACT inhaler Inhale into the lungs. 07/18/15  Yes [provider]  Beclomethasone Dipropionate 80 MCG/ACT AERS Place into the nose. 07/18/15  Yes [provider]  mometasone (NASONEX) 50 MCG/ACT nasal spray Place into the nose. 04/07/13  Yes [provider]  amLODipine (NORVASC) 5 MG tablet Take 1 tablet (5  mg total) by mouth daily. 05/27/16   Saguier, Percell Miller, PA-C  amoxicillin (AMOXIL) 500 MG capsule Take 1 capsule (500 mg total) by mouth 3 (three) times daily. 06/21/18   Blanchie Dessert, MD  benzonatate (TESSALON) 100 MG capsule Take 1 capsule (100 mg total) by mouth 3 (three) times daily as needed for cough. 03/30/18   Ward, Ozella Almond, PA-C  cyclobenzaprine (FLEXERIL) 5 MG tablet Take 1 tablet (5 mg total) by mouth 3 (three) times daily as needed for muscle spasms. 01/26/17   Davonna Belling, MD  HYDROcodone-acetaminophen (NORCO/VICODIN) 5-325 MG tablet Take 1-2 tablets by mouth every 6 (six) hours as needed. 06/21/18   Blanchie Dessert, MD  omeprazole (PRILOSEC) 20 MG capsule Take 1 capsule (20 mg total) by mouth daily. 07/04/16   Avie Echevaria B, PA-C  ondansetron (ZOFRAN ODT) 4 MG disintegrating tablet Take 1 tablet (4 mg total) by mouth every 8 (eight) hours as needed for nausea or vomiting. 03/30/18   Ward, Ozella Almond, PA-C  potassium chloride (K-DUR) 10 MEQ tablet Take 1 tablet by mouth daily. For 5 days 05/18/16   [provider]  sildenafil (VIAGRA) 50 MG tablet Take 1 tablet (50 mg total) by mouth daily as needed for erectile dysfunction. Max one tablet per 24 hours. 06/19/16   Saguier, Percell Miller, PA-C    Family History Family History  Problem Relation Age of Onset  . Gout Mother   .  Hypertension Mother   . Thyroid disease Mother   . Stroke Father   . Heart attack Father   . Thyroid disease Sister   . Hypertension Brother   . Sarcoidosis Cousin     Social History Social History   Tobacco Use  . Smoking status: Former Smoker    Packs/day: 1.00    Years: 25.00    Pack years: 25.00    Types: Cigarettes  . Smokeless tobacco: Never Used  Substance Use Topics  . Alcohol use: Yes    Comment: daily  . Drug use: No     Allergies   Patient has no known allergies.   Review of Systems Review of Systems  Constitutional: Negative for fever.  HENT: Negative for  drooling, facial swelling and mouth sores.   Musculoskeletal: Negative for neck pain.  All other systems reviewed and are negative.    Physical Exam Updated Vital Signs BP (!) 152/115 (BP Location: Right Arm)   Pulse 90   Temp 98.7 F (37.1 C) (Oral)   Resp 20   Ht 5\' 9"  (1.753 m)   Wt 95.3 kg   SpO2 99%   BMI 31.01 kg/m   Physical Exam Vitals signs and nursing note reviewed.  Constitutional:      General: He is not in acute distress.    Appearance: He is well-developed.  HENT:     Head: Normocephalic and atraumatic.     Mouth/Throat:      Comments: Diffuse periodontal disease. Eyes:     Conjunctiva/sclera: Conjunctivae normal.     Pupils: Pupils are equal, round, and reactive to light.  Neck:     Musculoskeletal: Normal range of motion and neck supple.     Comments: No stridor Cardiovascular:     Rate and Rhythm: Normal rate.     Heart sounds: No murmur.  Pulmonary:     Effort: Pulmonary effort is normal. No respiratory distress.  Abdominal:     Tenderness: There is no rebound.  Musculoskeletal: Normal range of motion.        General: No tenderness.  Skin:    General: Skin is warm and dry.     Findings: No erythema or rash.  Neurological:     Mental Status: He is alert and oriented to person, place, and time.  Psychiatric:        Behavior: Behavior normal.      ED Treatments / Results  Labs (all labs ordered are listed, but only abnormal results are displayed) Labs Reviewed - No data to display  EKG None  Radiology No results found.  Procedures Procedures (including critical care time)  Medications Ordered in ED Medications - No data to display   Initial Impression / Assessment and Plan / ED Course  I have reviewed the triage vital signs and the nursing notes.  Pertinent labs & imaging results that were available during my care of the patient were reviewed by me and considered in my medical decision making (see chart for details).         Pt with dental caries and facial swelling.  No signs of ludwig's angina or difficulty swallowing and no systemic symptoms. Will treat with amoxicillin and have pt f/u with dentist.   Final Clinical Impressions(s) / ED Diagnoses   Final diagnoses:  Dental caries  Dental abscess    ED Discharge Orders         Ordered    HYDROcodone-acetaminophen (NORCO/VICODIN) 5-325 MG tablet  Every 6 hours PRN  06/21/18 0958    amoxicillin (AMOXIL) 500 MG capsule  3 times daily     06/21/18 8527           Blanchie Dessert, MD 06/21/18 1003

## 2018-06-21 NOTE — ED Triage Notes (Signed)
Pt c/o right sided toothache since Wednesday. Pt tried to get to the dentist. Pt tried OTC pain reliever without relief.

## 2018-08-05 ENCOUNTER — Encounter (HOSPITAL_BASED_OUTPATIENT_CLINIC_OR_DEPARTMENT_OTHER): Payer: Self-pay | Admitting: Emergency Medicine

## 2018-08-05 ENCOUNTER — Emergency Department (HOSPITAL_BASED_OUTPATIENT_CLINIC_OR_DEPARTMENT_OTHER)
Admission: EM | Admit: 2018-08-05 | Discharge: 2018-08-05 | Disposition: A | Discharge status: Home / Self Care | Payer: Self-pay | Attending: Emergency Medicine | Admitting: Emergency Medicine

## 2018-08-05 ENCOUNTER — Other Ambulatory Visit: Payer: Self-pay

## 2018-08-05 DIAGNOSIS — F17210 Nicotine dependence, cigarettes, uncomplicated: Secondary | ICD-10-CM | POA: Insufficient documentation

## 2018-08-05 DIAGNOSIS — Y999 Unspecified external cause status: Secondary | ICD-10-CM | POA: Insufficient documentation

## 2018-08-05 DIAGNOSIS — S61213A Laceration without foreign body of left middle finger without damage to nail, initial encounter: Secondary | ICD-10-CM | POA: Insufficient documentation

## 2018-08-05 DIAGNOSIS — Y939 Activity, unspecified: Secondary | ICD-10-CM | POA: Insufficient documentation

## 2018-08-05 DIAGNOSIS — Y929 Unspecified place or not applicable: Secondary | ICD-10-CM | POA: Insufficient documentation

## 2018-08-05 DIAGNOSIS — S61412A Laceration without foreign body of left hand, initial encounter: Secondary | ICD-10-CM

## 2018-08-05 DIAGNOSIS — S61215A Laceration without foreign body of left ring finger without damage to nail, initial encounter: Secondary | ICD-10-CM | POA: Insufficient documentation

## 2018-08-05 DIAGNOSIS — W268XXA Contact with other sharp object(s), not elsewhere classified, initial encounter: Secondary | ICD-10-CM | POA: Insufficient documentation

## 2018-08-05 DIAGNOSIS — Z23 Encounter for immunization: Secondary | ICD-10-CM | POA: Insufficient documentation

## 2018-08-05 MED ORDER — TETANUS-DIPHTH-ACELL PERTUSSIS 5-2.5-18.5 LF-MCG/0.5 IM SUSP
0.5000 mL | Freq: Once | INTRAMUSCULAR | Status: AC
  Administered 2018-08-05: 0.5 mL via INTRAMUSCULAR
  Filled 2018-08-05: qty 0.5

## 2018-08-05 NOTE — ED Provider Notes (Signed)
[facility] EMERGENCY DEPARTMENT Provider Note   CSN: [id number] Arrival date & time: 08/05/18  1204    History   Chief Complaint Chief Complaint  Patient presents with  . Extremity Laceration    HPI Trevor Reilly is a 48 y.o. male.     The history is provided by the patient.  Laceration  Location:  Finger Finger laceration location:  L middle finger and L ring finger Laceration depth: shallow. Quality: straight   Bleeding: controlled   Time since incident:  15 hours Laceration mechanism:  Metal edge Pain details:    Quality:  Aching   Severity:  Mild   Timing:  Intermittent Relieved by:  Nothing Worsened by:  Nothing Tetanus status:  Out of date Associated symptoms: no fever, no rash, no redness and no swelling     Past Medical History:  Diagnosis Date  . Allergy   . Bronchitis   . Depression   . ED (erectile dysfunction)   . Fatty liver   . History of rectal bleeding   . Hydrocele   . Reflux   . Sarcoidosis   . Testicular tumor 2007    There are no active problems to display for this patient.   Past Surgical History:  Procedure Laterality Date  . arm surgery    . LUNG BIOPSY          Home Medications    Prior to Admission medications   Medication Sig Start Date End Date Taking? Authorizing Provider  albuterol (VENTOLIN HFA) 108 (90 Base) MCG/ACT inhaler Inhale into the lungs. 07/18/15   [provider]  sildenafil (VIAGRA) 50 MG tablet Take 1 tablet (50 mg total) by mouth daily as needed for erectile dysfunction. Max one tablet per 24 hours. 06/19/16   Saguier, Percell Miller, PA-C    Family History Family History  Problem Relation Age of Onset  . Gout Mother   . Hypertension Mother   . Thyroid disease Mother   . Stroke Father   . Heart attack Father   . Thyroid disease Sister   . Hypertension Brother   . Sarcoidosis Cousin     Social History Social History   Tobacco Use  . Smoking status: Current Every Day Smoker   Packs/day: 0.50    Years: 25.00    Pack years: 12.50    Types: Cigarettes  . Smokeless tobacco: Never Used  Substance Use Topics  . Alcohol use: Not Currently  . Drug use: No     Allergies   Patient has no known allergies.   Review of Systems Review of Systems  Constitutional: Negative for fever.  Skin: Positive for wound. Negative for color change, pallor and rash.     Physical Exam Updated Vital Signs  ED Triage Vitals [08/05/18 1218]  Enc Vitals Group     BP (!) 135/93     Pulse Rate 72     Resp 18     Temp 98.3 F (36.8 C)     Temp Source Oral     SpO2 98 %     Weight 210 lb (95.3 kg)     Height 5\' 9"  (1.753 m)     Head Circumference      Peak Flow      Pain Score 7     Pain Loc      Pain Edu?      Excl. in [place]?     Physical Exam Constitutional:      General: He is not  in acute distress.    Appearance: He is not ill-appearing.  HENT:     Mouth/Throat:     Mouth: Mucous membranes are moist.  Cardiovascular:     Pulses: Normal pulses.  Skin:    General: Skin is warm and dry.     Capillary Refill: Capillary refill takes less than 2 seconds.     Comments: 2 cm superficial laceration to back of left 3rd and 4th digits, hemostatic   Neurological:     Mental Status: He is alert.      ED Treatments / Results  Labs (all labs ordered are listed, but only abnormal results are displayed) Labs Reviewed - No data to display  EKG None  Radiology No results found.  Procedures Procedures (including critical care time)  Medications Ordered in ED Medications  Tdap (BOOSTRIX) injection 0.5 mL (0.5 mLs Intramuscular Given 08/05/18 1254)     Initial Impression / Assessment and Plan / ED Course  I have reviewed the triage vital signs and the nursing notes.  Pertinent labs & imaging results that were available during my care of the patient were reviewed by me and considered in my medical decision making (see chart for details).        Trevor Reilly  is a 48 year old male who presents to the ED with laceration to the back of his left hand.  Patient normal vitals.  Laceration occurred almost 15 hours ago.  Patient has 2 small lacerations to the back of his left fourth and third digit.  Wounds are hemostatic.  He was mostly concerned about having difficulty with getting the bleeding to stop.  He is not on blood thinners.  Overall these wounds are not amenable to suture repair as they are very minor and that the wounds are old at this time.  Bleeding has stopped.  Wounds were washed and tetanus shot was updated.  Recommend healing by secondary intention and using bacitracin ointment.  No signs of infection.  Recommend that patient return if he develops any signs of infection including redness, warmth, drainage.  Discharged from ED in good condition.  This chart was dictated using voice recognition software.  Despite best efforts to proofread,  errors can occur which can change the documentation meaning.    Final Clinical Impressions(s) / ED Diagnoses   Final diagnoses:  Laceration of left hand, foreign body presence unspecified, initial encounter    ED Discharge Orders    None       Lennice Sites, DO 08/05/18 1315

## 2018-08-05 NOTE — ED Triage Notes (Signed)
Lac to 3rd and 4th fingers of left hand by a metal can lid last night at approx 9pm.

## 2018-09-27 ENCOUNTER — Other Ambulatory Visit: Payer: Self-pay

## 2018-09-27 ENCOUNTER — Emergency Department (HOSPITAL_BASED_OUTPATIENT_CLINIC_OR_DEPARTMENT_OTHER)
Admission: EM | Admit: 2018-09-27 | Discharge: 2018-09-27 | Disposition: A | Discharge status: Home / Self Care | Payer: Self-pay | Attending: Emergency Medicine | Admitting: Emergency Medicine

## 2018-09-27 ENCOUNTER — Encounter (HOSPITAL_BASED_OUTPATIENT_CLINIC_OR_DEPARTMENT_OTHER): Payer: Self-pay | Admitting: Emergency Medicine

## 2018-09-27 ENCOUNTER — Emergency Department (HOSPITAL_BASED_OUTPATIENT_CLINIC_OR_DEPARTMENT_OTHER): Payer: Self-pay

## 2018-09-27 DIAGNOSIS — N451 Epididymitis: Secondary | ICD-10-CM | POA: Insufficient documentation

## 2018-09-27 DIAGNOSIS — N3001 Acute cystitis with hematuria: Secondary | ICD-10-CM | POA: Insufficient documentation

## 2018-09-27 DIAGNOSIS — Z79899 Other long term (current) drug therapy: Secondary | ICD-10-CM | POA: Insufficient documentation

## 2018-09-27 DIAGNOSIS — G0490 Encephalitis and encephalomyelitis, unspecified: Secondary | ICD-10-CM

## 2018-09-27 DIAGNOSIS — N50819 Testicular pain, unspecified: Secondary | ICD-10-CM

## 2018-09-27 DIAGNOSIS — F17210 Nicotine dependence, cigarettes, uncomplicated: Secondary | ICD-10-CM | POA: Insufficient documentation

## 2018-09-27 LAB — CBC WITH DIFFERENTIAL/PLATELET
Abs Immature Granulocytes: 0.01 10*3/uL (ref 0.00–0.07)
Basophils Absolute: 0.1 10*3/uL (ref 0.0–0.1)
Basophils Relative: 1 %
Eosinophils Absolute: 0.1 10*3/uL (ref 0.0–0.5)
Eosinophils Relative: 2 %
HCT: 49.7 % (ref 39.0–52.0)
Hemoglobin: 16.1 g/dL (ref 13.0–17.0)
Immature Granulocytes: 0 %
Lymphocytes Relative: 19 %
Lymphs Abs: 1.4 10*3/uL (ref 0.7–4.0)
MCH: 31 pg (ref 26.0–34.0)
MCHC: 32.4 g/dL (ref 30.0–36.0)
MCV: 95.6 fL (ref 80.0–100.0)
Monocytes Absolute: 0.8 10*3/uL (ref 0.1–1.0)
Monocytes Relative: 10 %
Neutro Abs: 5 10*3/uL (ref 1.7–7.7)
Neutrophils Relative %: 68 %
Platelets: 235 10*3/uL (ref 150–400)
RBC: 5.2 MIL/uL (ref 4.22–5.81)
RDW: 14 % (ref 11.5–15.5)
WBC: 7.3 10*3/uL (ref 4.0–10.5)
nRBC: 0 % (ref 0.0–0.2)

## 2018-09-27 LAB — URINALYSIS, MICROSCOPIC (REFLEX): WBC, UA: >50 WBC/hpf (ref 0–5)

## 2018-09-27 LAB — BASIC METABOLIC PANEL
Anion gap: 9 (ref 5–15)
BUN: 5 mg/dL — ABNORMAL LOW (ref 6–20)
CO2: 25 mmol/L (ref 22–32)
Calcium: 8.9 mg/dL (ref 8.9–10.3)
Chloride: 103 mmol/L (ref 98–111)
Creatinine, Ser: 0.88 mg/dL (ref 0.61–1.24)
GFR calc Af Amer: >60 mL/min (ref >60)
GFR calc non Af Amer: >60 mL/min (ref >60)
Glucose, Bld: 98 mg/dL (ref 70–99)
Potassium: 4 mmol/L (ref 3.5–5.1)
Sodium: 137 mmol/L (ref 135–145)

## 2018-09-27 LAB — URINALYSIS, ROUTINE W REFLEX MICROSCOPIC
Bilirubin Urine: NEGATIVE
Glucose, UA: NEGATIVE mg/dL
Ketones, ur: NEGATIVE mg/dL
Nitrite: POSITIVE — AB
Protein, ur: NEGATIVE mg/dL
Specific Gravity, Urine: 1.015 (ref 1.005–1.030)
pH: 6 (ref 5.0–8.0)

## 2018-09-27 IMAGING — US ULTRASOUND SCROTUM DOPPLER COMPLETE
1 series · 13 of 25 positions shown · non-contrast
Comparison: None.

CLINICAL DATA: Left testicular pain and swelling for 1 week.

EXAM:
SCROTAL ULTRASOUND
DOPPLER ULTRASOUND OF THE TESTICLES
TECHNIQUE: Complete ultrasound examination of the testicles, epididymis, and
other scrotal structures was performed. Color and spectral Doppler
ultrasound were also utilized to evaluate blood flow to the
testicles.

[Series 1: ultrasound scrotum doppler complete · 13 of 55 slices shown]
[im 1/55]
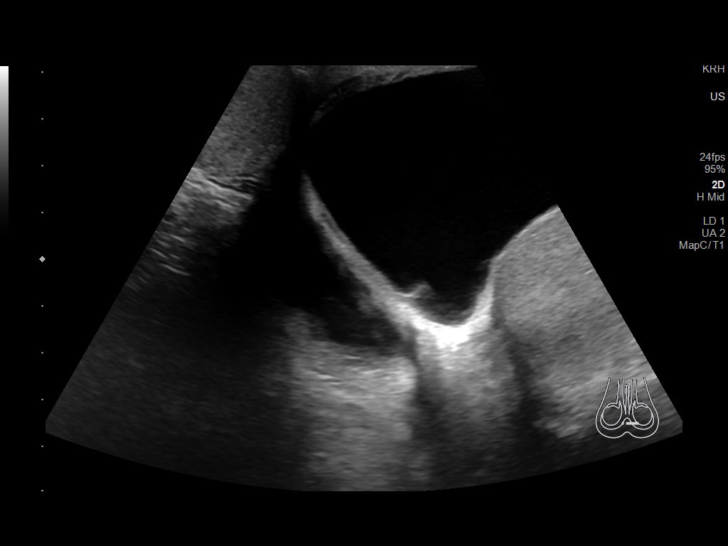
[im 5/55]
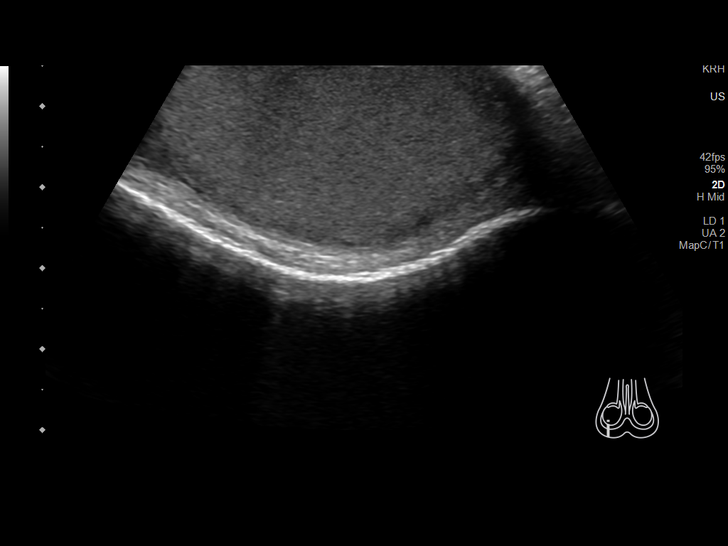
[im 10/55]
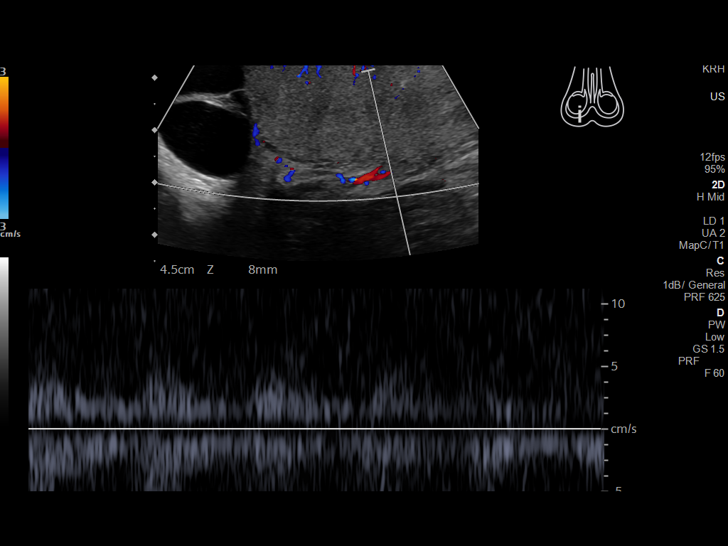
[im 14/55]
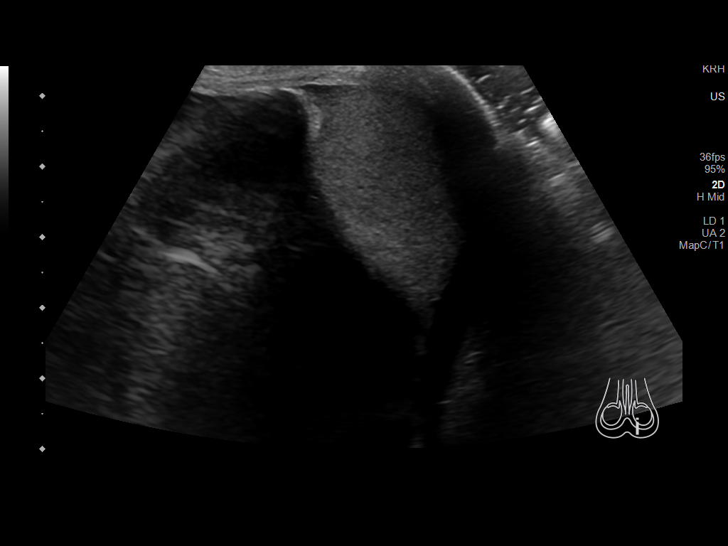
[im 19/55]
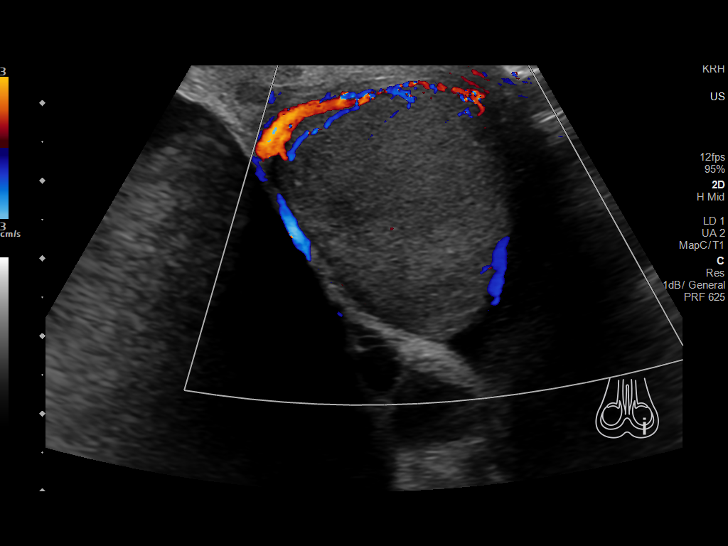
[im 23/55]
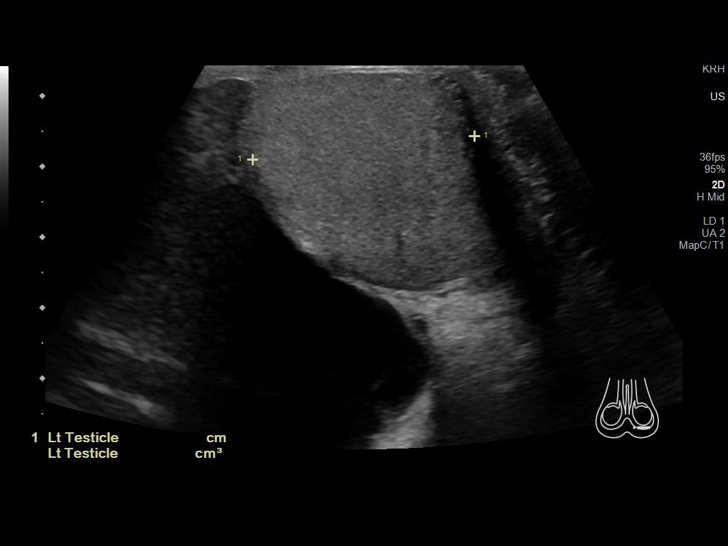
[im 28/55]
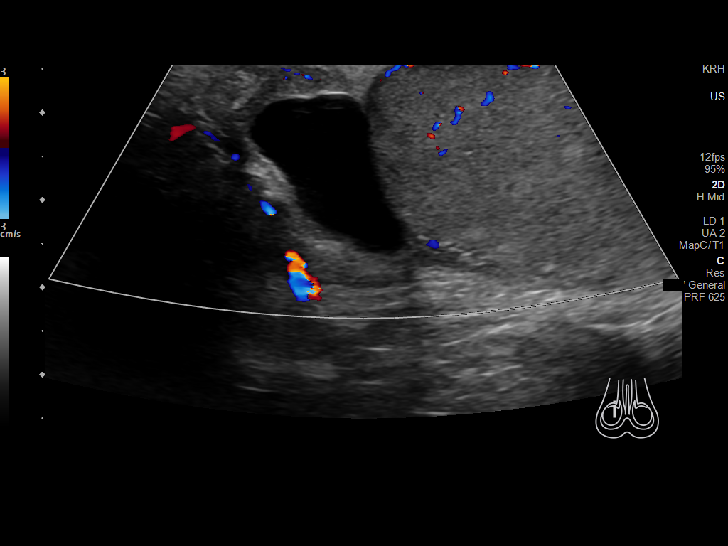
[im 32/55]
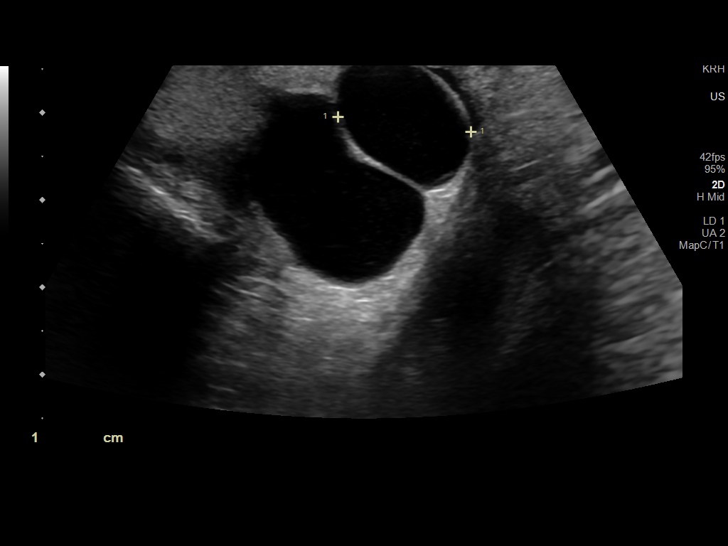
[im 37/55]
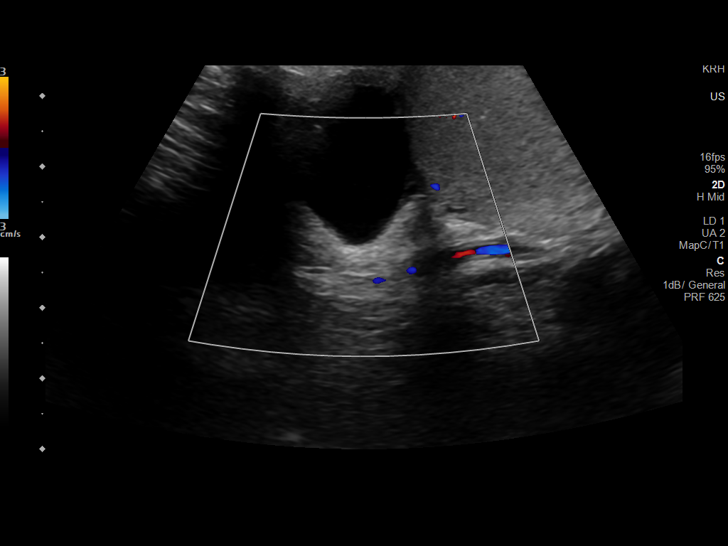
[im 41/55]
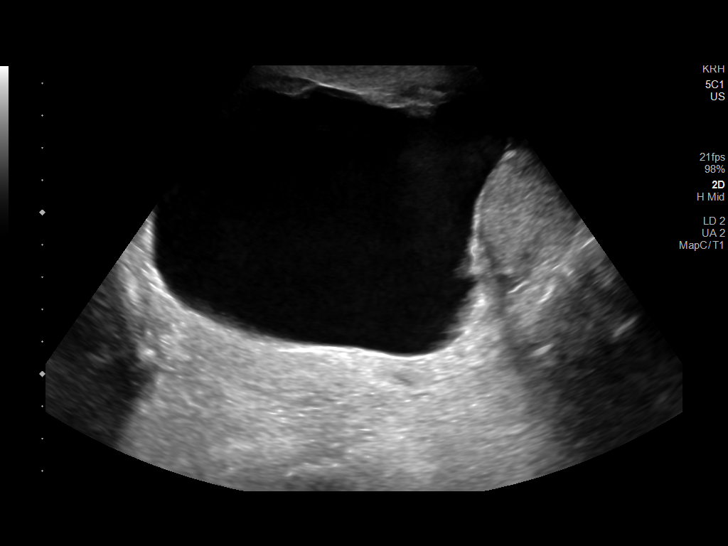
[im 46/55]
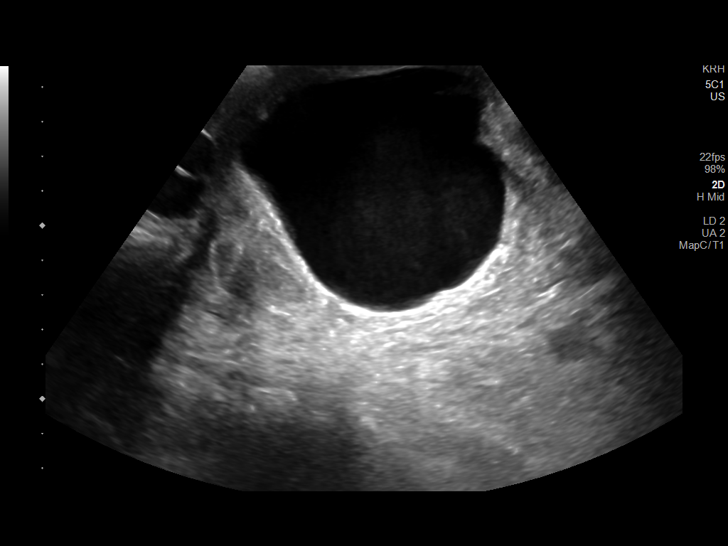
[im 50/55]
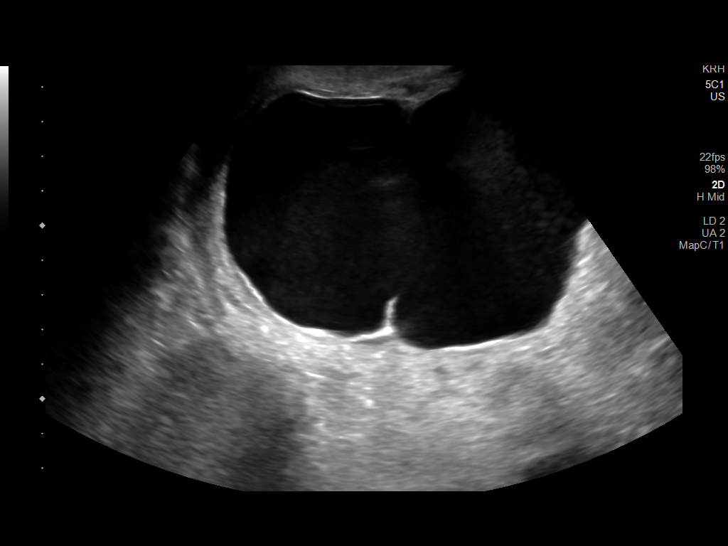
[im 55/55]
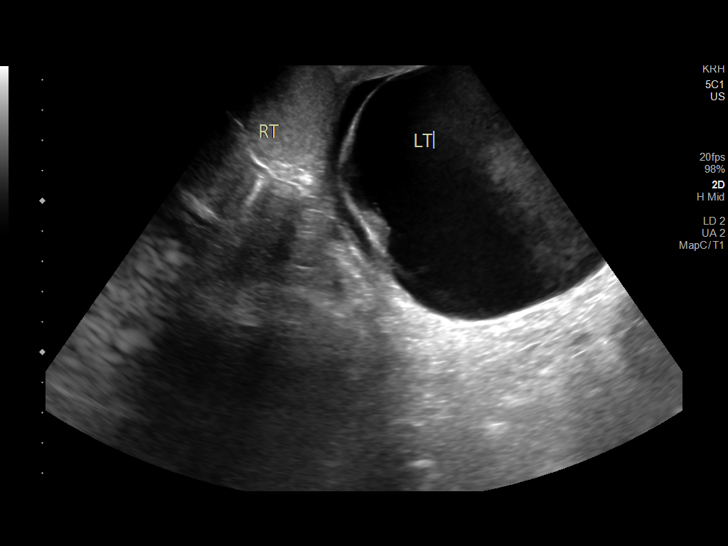

[13 of 25 positions shown; findings below may reference images not displayed]

FINDINGS: Right testicle

Measurements: 5.3 x 2.7 x 3.5 cm. No mass or microlithiasis
visualized.

Left testicle

Measurements: 3.9 x 2.9 x 3.2 cm. No mass or microlithiasis
visualized.

Right epididymis: There are 2 cysts within the right epididymis,
both measuring 2.3 cm.

Left epididymis: The left at the did miss is not seen. There is a
large cystic structure with septations in the left scrotum measuring
13.4 x 8.3 x 11.1 cm obscuring visualization of the left epididymis.

Hydrocele:  Small bilateral hydroceles.

Varicocele:  None visualized.

Pulsed Doppler interrogation of both testes demonstrates normal low
resistance arterial and venous waveforms bilaterally.
IMPRESSION: Normal bilateral testis.  No evidence of testicular torsion.

A 13.4 x 8.3 x 11.1 cm cyst with septations in the left scrotum. The
large cyst in the left scrotum obscured the visualization of the
left epididymis.

Small right epididymal cysts.

Small bilateral hydroceles.

## 2018-09-27 MED ORDER — ACETAMINOPHEN 500 MG PO TABS: 1000.0000 mg | ORAL_TABLET | Freq: Three times a day (TID) | ORAL | 0 refills | Status: DC | PRN

## 2018-09-27 MED ORDER — MORPHINE SULFATE (PF) 4 MG/ML IV SOLN
4.0000 mg | Freq: Once | INTRAVENOUS | Status: AC
  Administered 2018-09-27: 4 mg via INTRAVENOUS
  Filled 2018-09-27: qty 1

## 2018-09-27 MED ORDER — KETOROLAC TROMETHAMINE 30 MG/ML IJ SOLN
30.0000 mg | Freq: Once | INTRAMUSCULAR | Status: AC
  Administered 2018-09-27: 30 mg via INTRAVENOUS
  Filled 2018-09-27: qty 1

## 2018-09-27 MED ORDER — LEVOFLOXACIN 500 MG PO TABS: 500.0000 mg | ORAL_TABLET | Freq: Every day | ORAL | 0 refills | Status: AC

## 2018-09-27 MED ORDER — IBUPROFEN 600 MG PO TABS: 600.0000 mg | ORAL_TABLET | Freq: Four times a day (QID) | ORAL | 0 refills | Status: DC | PRN

## 2018-09-27 NOTE — Discharge Instructions (Signed)
Take Levaquin as prescribed until completed.  Do not do high impact activities such as running, playing basketball, lifting heavy weights while taking this medication as it can cause tendon rupture.  Take ibuprofen and Tylenol as prescribed, as needed for your pain.  Please follow-up with urologist below within the next week for further evaluation and treatment of your symptoms and your cyst.  Please return to the emergency department if you develop any new or worsening symptoms

## 2018-09-27 NOTE — ED Notes (Signed)
Patient transported to Ultrasound 

## 2018-09-27 NOTE — ED Provider Notes (Signed)
[facility] EMERGENCY DEPARTMENT Provider Note   CSN: [id number] Arrival date & time: 09/27/18  1322    History   Chief Complaint Chief Complaint  Patient presents with  . Testicle Pain    HPI Trevor Reilly is a 48 y.o. male with history of left testicular cyst who presents with a one-week history of left testicle pain and swelling as well as left flank pain.  Patient also reports seeing a brown substance in his ejaculate.  He denies any concern for STD exposure.  He denies any dysuria, but does have some flank pain when he urinates and feeling of tingling and weakness in his left leg when he urinates.  He is also had some suprapubic pain in the left lower quadrant pain.  He denies any fever, nausea, vomiting, chest pain, shortness of breath.  Patient reports testicular cyst that he was supposed to have operated on several years ago, but missed his scheduled surgery and never rescheduled it.  He was told it was benign.     HPI  Past Medical History:  Diagnosis Date  . Allergy   . Bronchitis   . Depression   . ED (erectile dysfunction)   . Fatty liver   . History of rectal bleeding   . Hydrocele   . Reflux   . Sarcoidosis   . Testicular tumor 2007    There are no active problems to display for this patient.   Past Surgical History:  Procedure Laterality Date  . arm surgery    . LUNG BIOPSY          Home Medications    Prior to Admission medications   Medication Sig Start Date End Date Taking? Authorizing Provider  acetaminophen (TYLENOL) 500 MG tablet Take 2 tablets (1,000 mg total) by mouth every 8 (eight) hours as needed for moderate pain. 09/27/18   Melonee Gerstel, Bea Graff, PA-C  albuterol (VENTOLIN HFA) 108 (90 Base) MCG/ACT inhaler Inhale into the lungs. 07/18/15   [provider]  ibuprofen (ADVIL) 600 MG tablet Take 1 tablet (600 mg total) by mouth every 6 (six) hours as needed. 09/27/18   Randal Goens, Bea Graff, PA-C  levofloxacin (LEVAQUIN) 500 MG tablet  Take 1 tablet (500 mg total) by mouth daily for 10 days. 09/27/18 10/07/18  Frederica Kuster, PA-C  sildenafil (VIAGRA) 50 MG tablet Take 1 tablet (50 mg total) by mouth daily as needed for erectile dysfunction. Max one tablet per 24 hours. 06/19/16   Saguier, Percell Miller, PA-C    Family History Family History  Problem Relation Age of Onset  . Gout Mother   . Hypertension Mother   . Thyroid disease Mother   . Stroke Father   . Heart attack Father   . Thyroid disease Sister   . Hypertension Brother   . Sarcoidosis Cousin     Social History Social History   Tobacco Use  . Smoking status: Current Every Day Smoker    Packs/day: 0.50    Years: 25.00    Pack years: 12.50    Types: Cigarettes  . Smokeless tobacco: Never Used  Substance Use Topics  . Alcohol use: Yes  . Drug use: No     Allergies   Patient has no known allergies.   Review of Systems Review of Systems  Constitutional: Negative for chills and fever.  HENT: Negative for facial swelling and sore throat.   Respiratory: Negative for shortness of breath.   Cardiovascular: Negative for chest pain.  Gastrointestinal: Negative for  abdominal pain, nausea and vomiting.  Genitourinary: Positive for discharge (brown ejaculate), flank pain, scrotal swelling and testicular pain. Negative for dysuria, penile pain and penile swelling.  Musculoskeletal: Negative for back pain.  Skin: Negative for rash and wound.  Neurological: Negative for headaches.  Psychiatric/Behavioral: The patient is not nervous/anxious.      Physical Exam Updated Vital Signs BP (!) 128/93   Pulse 85   Temp 98.6 F (37 C) (Oral)   Resp 20   Ht 5\' 9"  (1.753 m)   Wt 93.4 kg   SpO2 96%   BMI 30.42 kg/m   Physical Exam Vitals signs and nursing note reviewed. Exam conducted with a chaperone present.  Constitutional:      General: He is not in acute distress.    Appearance: He is well-developed. He is not diaphoretic.  HENT:     Head: Normocephalic  and atraumatic.     Mouth/Throat:     Pharynx: No oropharyngeal exudate.  Eyes:     General: No scleral icterus.       Right eye: No discharge.        Left eye: No discharge.     Conjunctiva/sclera: Conjunctivae normal.     Pupils: Pupils are equal, round, and reactive to light.  Neck:     Musculoskeletal: Normal range of motion and neck supple.     Thyroid: No thyromegaly.  Cardiovascular:     Rate and Rhythm: Normal rate and regular rhythm.     Heart sounds: Normal heart sounds. No murmur. No friction rub. No gallop.   Pulmonary:     Effort: Pulmonary effort is normal. No respiratory distress.     Breath sounds: Normal breath sounds. No stridor. No wheezing or rales.  Abdominal:     General: Bowel sounds are normal. There is no distension.     Palpations: Abdomen is soft.     Tenderness: There is no abdominal tenderness. There is no right CVA tenderness, left CVA tenderness, guarding or rebound.  Genitourinary:    Scrotum/Testes:        Left: Tenderness and swelling present.     Comments: Large fluid-filled swelling noted to the left scrotum; penis not visualized because of this; exam also limited due to swelling Prostate not palpated on rectal exam, unable to reach with DRE; no tenderness however Lymphadenopathy:     Cervical: No cervical adenopathy.  Skin:    General: Skin is warm and dry.     Coloration: Skin is not pale.     Findings: No rash.  Neurological:     Mental Status: He is alert.     Coordination: Coordination normal.      ED Treatments / Results  Labs (all labs ordered are listed, but only abnormal results are displayed) Labs Reviewed  URINALYSIS, ROUTINE W REFLEX MICROSCOPIC - Abnormal; Notable for the following components:      Result Value   APPearance CLOUDY (*)    Hgb urine dipstick TRACE (*)    Nitrite POSITIVE (*)    Leukocytes,Ua MODERATE (*)    All other components within normal limits  URINALYSIS, MICROSCOPIC (REFLEX) - Abnormal; Notable  for the following components:   Bacteria, UA MANY (*)    All other components within normal limits  BASIC METABOLIC PANEL - Abnormal; Notable for the following components:   BUN 5 (*)    All other components within normal limits  URINE CULTURE  CBC WITH DIFFERENTIAL/PLATELET  GC/CHLAMYDIA PROBE AMP ([place]) NOT AT  [place]    EKG None  Radiology US Scrotum W/doppler  Result Date: 09/27/2018 CLINICAL DATA:  Left testicular pain and swelling for 1 week. EXAM: SCROTAL ULTRASOUND DOPPLER ULTRASOUND OF THE TESTICLES TECHNIQUE: Complete ultrasound examination of the testicles, epididymis, and other scrotal structures was performed. Color and spectral Doppler ultrasound were also utilized to evaluate blood flow to the testicles. COMPARISON:  None. FINDINGS: Right testicle Measurements: 5.3 x 2.7 x 3.5 cm. No mass or microlithiasis visualized. Left testicle Measurements: 3.9 x 2.9 x 3.2 cm. No mass or microlithiasis visualized. Right epididymis: There are 2 cysts within the right epididymis, both measuring 2.3 cm. Left epididymis: The left at the did miss is not seen. There is a large cystic structure with septations in the left scrotum measuring 13.4 x 8.3 x 11.1 cm obscuring visualization of the left epididymis. Hydrocele:  Small bilateral hydroceles. Varicocele:  None visualized. Pulsed Doppler interrogation of both testes demonstrates normal low resistance arterial and venous waveforms bilaterally. IMPRESSION: Normal bilateral testis.  No evidence of testicular torsion. A 13.4 x 8.3 x 11.1 cm cyst with septations in the left scrotum. The large cyst in the left scrotum obscured the visualization of the left epididymis. Small right epididymal cysts. Small bilateral hydroceles. Electronically Signed   By: Abelardo Diesel M.D.   On: 09/27/2018 15:10    Procedures Procedures (including critical care time)  Medications Ordered in ED Medications  morphine 4 MG/ML injection 4 mg (4 mg Intravenous Given  09/27/18 1526)  ketorolac (TORADOL) 30 MG/ML injection 30 mg (30 mg Intravenous Given 09/27/18 1646)     Initial Impression / Assessment and Plan / ED Course  I have reviewed the triage vital signs and the nursing notes.  Pertinent labs & imaging results that were available during my care of the patient were reviewed by me and considered in my medical decision making (see chart for details).        Patient presenting with a one-week history of left testicular pain and swelling as well as left lower quadrant and some low back pain.  Patient found to have UTI.  Labs are unremarkable.  Scrotal ultrasound shows normal bilateral testes with no evidence of torsion; large cyst with septations in the scrotum which obscures epididymis.  Patient is having tenderness on the left side.  Considering urinalysis findings and tenderness, will treat for UTI and epididymitis with Levaquin.  Low suspicion of GC/chlamydia, however urine pending.  Urine culture also pending.  Patient advised not to do any high impact activities and explained the risk of tendon rupture.  Ibuprofen and Tylenol as needed for pain.  Patient advised to follow-up with urology for further evaluation and treatment of the cyst as well as UTI and epididymitis.  Return precautions discussed.  Patient understands and agrees with plan.  Patient vitals stable throughout ED course and discharged in satisfactory condition. I discussed patient case with Dr. Ronnald Nian who guided the patient's management and agrees with plan.   Final Clinical Impressions(s) / ED Diagnoses   Final diagnoses:  Acute cystitis with hematuria  Ependymitis    ED Discharge Orders         Ordered    levofloxacin (LEVAQUIN) 500 MG tablet  Daily     09/27/18 1642    ibuprofen (ADVIL) 600 MG tablet  Every 6 hours PRN     09/27/18 1642    acetaminophen (TYLENOL) 500 MG tablet  Every 8 hours PRN     09/27/18 1642  Frederica Kuster, PA-C 09/27/18 Brainard, [place], DO 09/27/18 1706

## 2018-09-27 NOTE — ED Triage Notes (Signed)
L testicle pain and swelling x 1 week

## 2018-09-30 LAB — URINE CULTURE: Culture: >=100000 — AB

## 2018-10-02 ENCOUNTER — Telehealth: Payer: Self-pay | Admitting: *Deleted

## 2018-10-02 NOTE — Telephone Encounter (Signed)
Post ED Visit - Positive Culture Follow-up  Culture report reviewed by antimicrobial stewardship pharmacist: [facility] Team []  Elenor Quinones, Pharm.D. []  Heide Guile, Pharm.D., BCPS AQ-ID []  Parks Neptune, Pharm.D., BCPS []  Alycia Rossetti, Pharm.D., BCPS []  [place], [place].D., BCPS, AAHIVP []  Legrand Como, Pharm.D., BCPS, AAHIVP []  Salome Arnt, PharmD, BCPS []  Johnnette Gourd, PharmD, BCPS []  Hughes Better, PharmD, BCPS []  Leeroy Cha, PharmD []  Laqueta Linden, PharmD, BCPS []  Albertina Parr, PharmD  [facility] Team []  Leodis Sias, PharmD []  Lindell Spar, PharmD []  Royetta Asal, PharmD []  Graylin Shiver, Rph []  Rema Fendt) Glennon Mac, PharmD []  Arlyn Dunning, PharmD []  Netta Cedars, PharmD []  Dia Sitter, PharmD []  Leone Haven, PharmD []  Gretta Arab, PharmD []  Theodis Shove, PharmD []  Peggyann Juba, PharmD []  Reuel Boom, PharmD Eddie Candle, Pharm D  Positive urine culture Treated with Levofloxacin, organism sensitive to the same and no further patient follow-up is required at this time.  Harlon Flor [facility] 10/02/2018, 10:41 AM

## 2019-09-19 ENCOUNTER — Other Ambulatory Visit: Payer: Self-pay

## 2019-09-19 ENCOUNTER — Encounter (HOSPITAL_BASED_OUTPATIENT_CLINIC_OR_DEPARTMENT_OTHER): Payer: Self-pay | Admitting: Emergency Medicine

## 2019-09-19 DIAGNOSIS — F17210 Nicotine dependence, cigarettes, uncomplicated: Secondary | ICD-10-CM | POA: Insufficient documentation

## 2019-09-19 DIAGNOSIS — R1010 Upper abdominal pain, unspecified: Secondary | ICD-10-CM | POA: Insufficient documentation

## 2019-09-19 NOTE — ED Triage Notes (Signed)
Patient states that he is having generalized abdominal pain that started about 1 week ago. The patient reports that today he started to have vomiting with blood in it and pain up into his epigastric region. The patient is having tremors in triage that he states are related to the pain

## 2019-09-20 ENCOUNTER — Emergency Department (HOSPITAL_BASED_OUTPATIENT_CLINIC_OR_DEPARTMENT_OTHER)
Admission: EM | Admit: 2019-09-20 | Discharge: 2019-09-20 | Disposition: A | Discharge status: Home / Self Care | Payer: Self-pay | Attending: Emergency Medicine | Admitting: Emergency Medicine

## 2019-09-20 ENCOUNTER — Encounter (HOSPITAL_BASED_OUTPATIENT_CLINIC_OR_DEPARTMENT_OTHER): Payer: Self-pay

## 2019-09-20 ENCOUNTER — Emergency Department (HOSPITAL_BASED_OUTPATIENT_CLINIC_OR_DEPARTMENT_OTHER): Payer: Self-pay

## 2019-09-20 DIAGNOSIS — R1010 Upper abdominal pain, unspecified: Secondary | ICD-10-CM

## 2019-09-20 LAB — CBC WITH DIFFERENTIAL/PLATELET
Abs Immature Granulocytes: 0.02 10*3/uL (ref 0.00–0.07)
Basophils Absolute: 0.1 10*3/uL (ref 0.0–0.1)
Basophils Relative: 1 %
Eosinophils Absolute: 0.2 10*3/uL (ref 0.0–0.5)
Eosinophils Relative: 3 %
HCT: 47.9 % (ref 39.0–52.0)
Hemoglobin: 15.8 g/dL (ref 13.0–17.0)
Immature Granulocytes: 0 %
Lymphocytes Relative: 22 %
Lymphs Abs: 1.7 10*3/uL (ref 0.7–4.0)
MCH: 32.1 pg (ref 26.0–34.0)
MCHC: 33 g/dL (ref 30.0–36.0)
MCV: 97.4 fL (ref 80.0–100.0)
Monocytes Absolute: 0.6 10*3/uL (ref 0.1–1.0)
Monocytes Relative: 7 %
Neutro Abs: 5.2 10*3/uL (ref 1.7–7.7)
Neutrophils Relative %: 67 %
Platelets: 199 10*3/uL (ref 150–400)
RBC: 4.92 MIL/uL (ref 4.22–5.81)
RDW: 13.8 % (ref 11.5–15.5)
WBC: 7.8 10*3/uL (ref 4.0–10.5)
nRBC: 0 % (ref 0.0–0.2)

## 2019-09-20 LAB — COMPREHENSIVE METABOLIC PANEL
ALT: 22 U/L (ref 0–44)
AST: 119 U/L — ABNORMAL HIGH (ref 15–41)
Albumin: 3.2 g/dL — ABNORMAL LOW (ref 3.5–5.0)
Alkaline Phosphatase: 176 U/L — ABNORMAL HIGH (ref 38–126)
Anion gap: 12 (ref 5–15)
BUN: <5 mg/dL — ABNORMAL LOW (ref 6–20)
CO2: 26 mmol/L (ref 22–32)
Calcium: 8.5 mg/dL — ABNORMAL LOW (ref 8.9–10.3)
Chloride: 98 mmol/L (ref 98–111)
Creatinine, Ser: 0.71 mg/dL (ref 0.61–1.24)
GFR calc Af Amer: >60 mL/min (ref >60)
GFR calc non Af Amer: >60 mL/min (ref >60)
Glucose, Bld: 111 mg/dL — ABNORMAL HIGH (ref 70–99)
Potassium: 3.5 mmol/L (ref 3.5–5.1)
Sodium: 136 mmol/L (ref 135–145)
Total Bilirubin: 1.3 mg/dL — ABNORMAL HIGH (ref 0.3–1.2)
Total Protein: 8.9 g/dL — ABNORMAL HIGH (ref 6.5–8.1)

## 2019-09-20 LAB — OCCULT BLOOD X 1 CARD TO LAB, STOOL: Fecal Occult Bld: NEGATIVE

## 2019-09-20 LAB — URINALYSIS, ROUTINE W REFLEX MICROSCOPIC
Bilirubin Urine: NEGATIVE
Glucose, UA: NEGATIVE mg/dL
Ketones, ur: NEGATIVE mg/dL
Leukocytes,Ua: NEGATIVE
Nitrite: NEGATIVE
Protein, ur: NEGATIVE mg/dL
Specific Gravity, Urine: 1.02 (ref 1.005–1.030)
pH: 5.5 (ref 5.0–8.0)

## 2019-09-20 LAB — URINALYSIS, MICROSCOPIC (REFLEX)

## 2019-09-20 LAB — LIPASE, BLOOD: Lipase: 28 U/L (ref 11–51)

## 2019-09-20 IMAGING — CT CT ABD-PELV W/ CM
2 of 5 series · 16 of 46 positions shown, 18 images · IV contrast (Omnipaque)
Comparison: [DATE]

CLINICAL DATA: Generalized abdominal pain for 1 week

EXAM:
CT ABDOMEN AND PELVIS WITH CONTRAST
TECHNIQUE: Multidetector CT imaging of the abdomen and pelvis was performed
using the standard protocol following bolus administration of
intravenous contrast.
CONTRAST:  100mL OMNIPAQUE IOHEXOL 300 MG/ML  SOLN

[Series 2: axial st · axial · 0.80mm/px · z∈[-272,+148]mm · 13 of 94 slices shown, 15 images]
[im 5/94  soft-tissue]
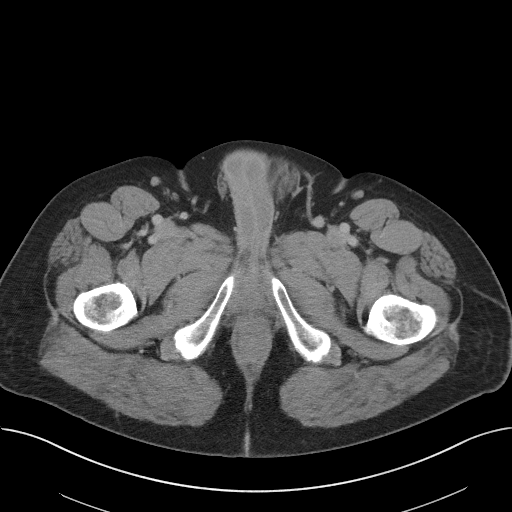
[im 5/94  bone]
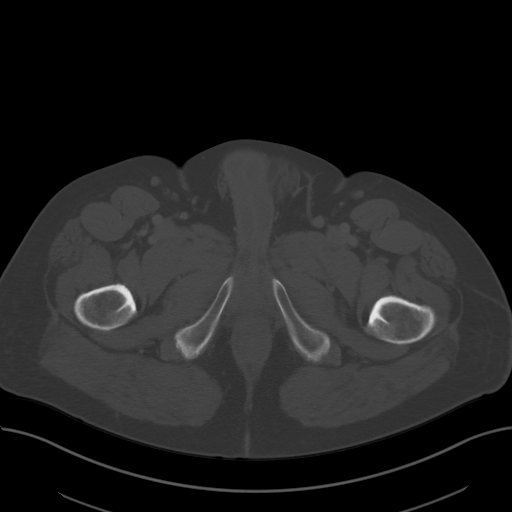
[im 15/94  soft-tissue]
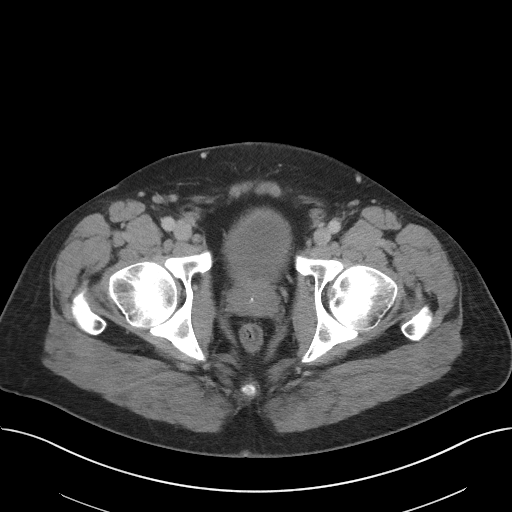
[im 20/94  soft-tissue]
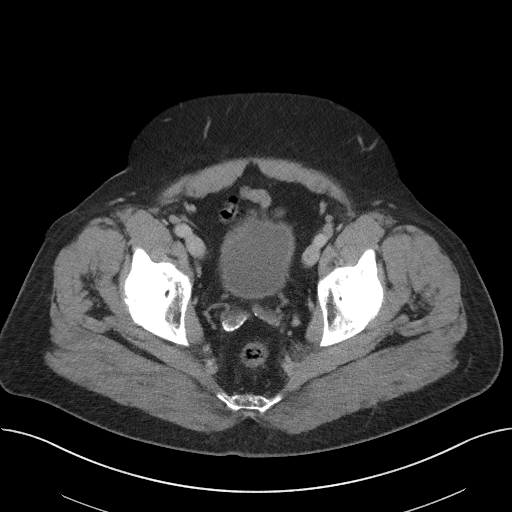
[im 25/94  soft-tissue]
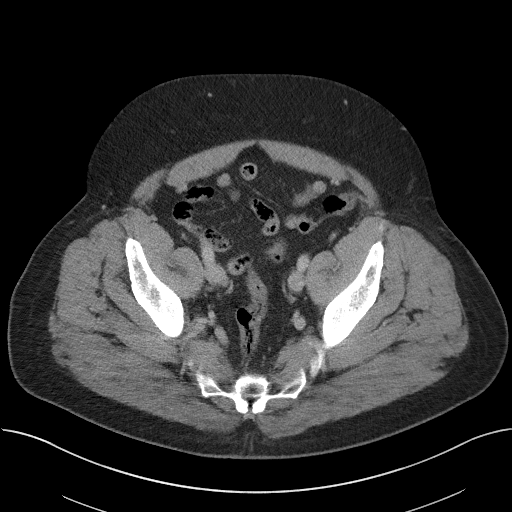
[im 35/94  soft-tissue]
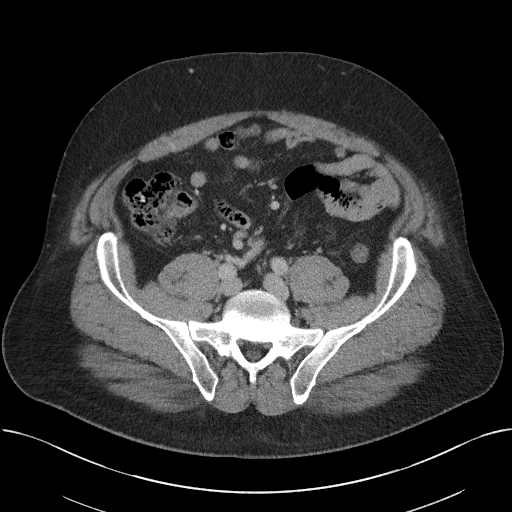
[im 40/94  soft-tissue]
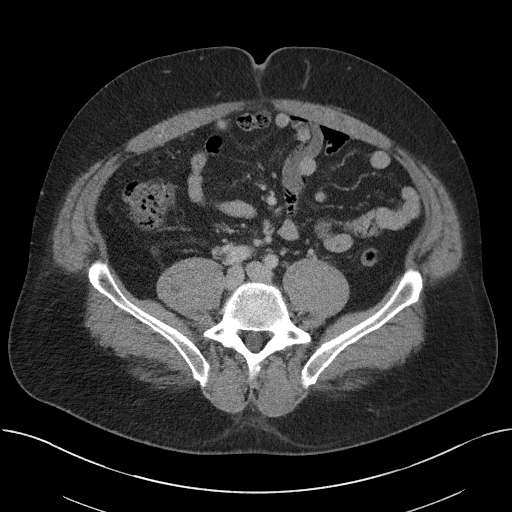
[im 49/94  soft-tissue]
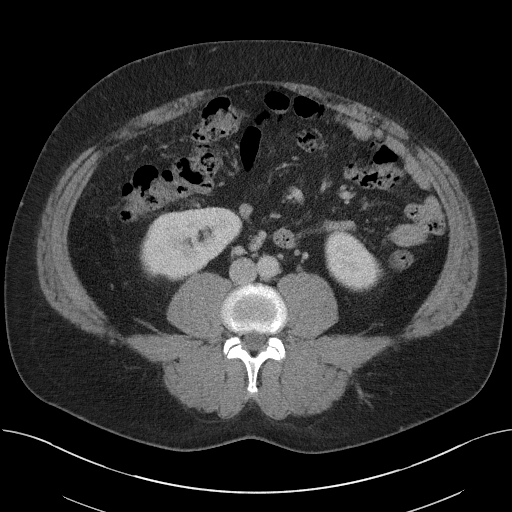
[im 54/94  soft-tissue]
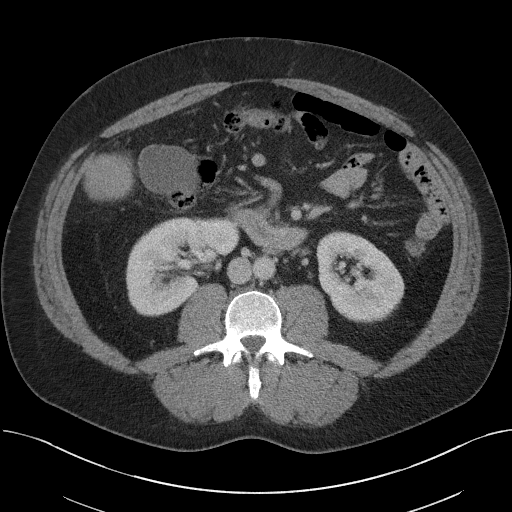
[im 59/94  soft-tissue]
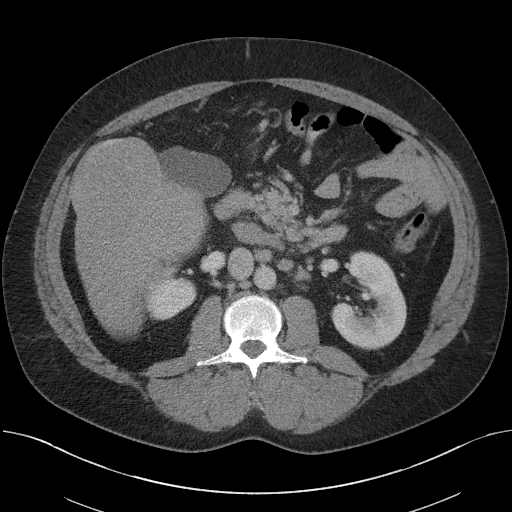
[im 59/94  bone]
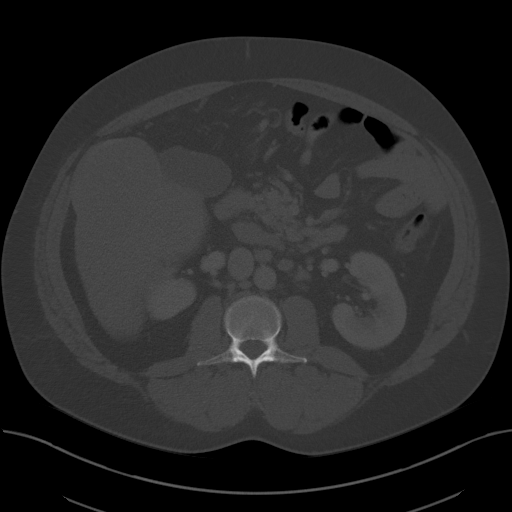
[im 69/94  soft-tissue]
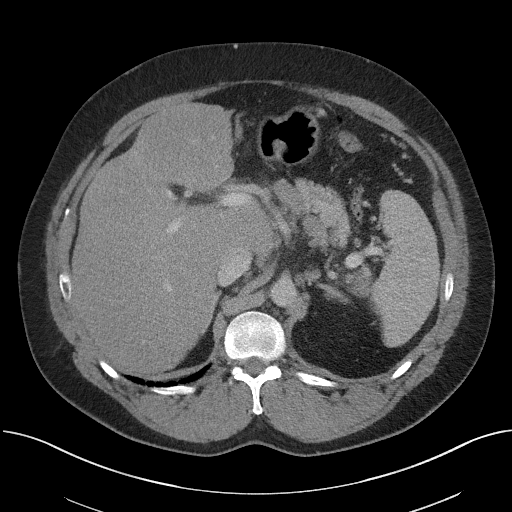
[im 74/94  soft-tissue]
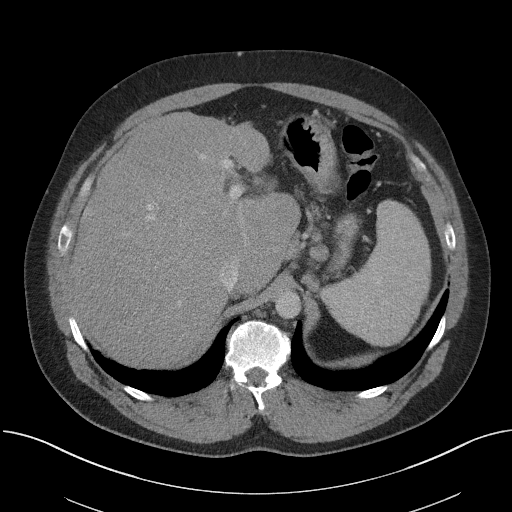
[im 79/94  soft-tissue]
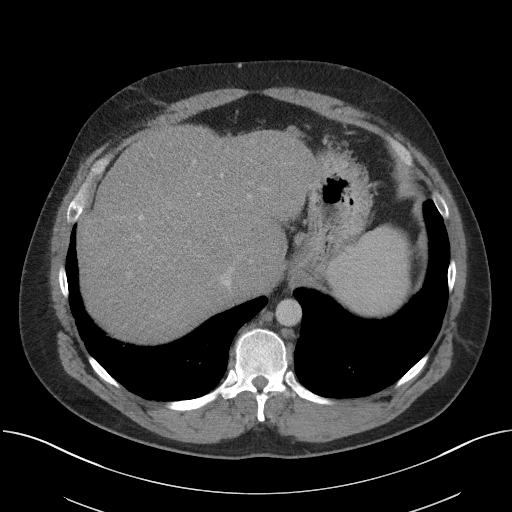
[im 89/94  soft-tissue]
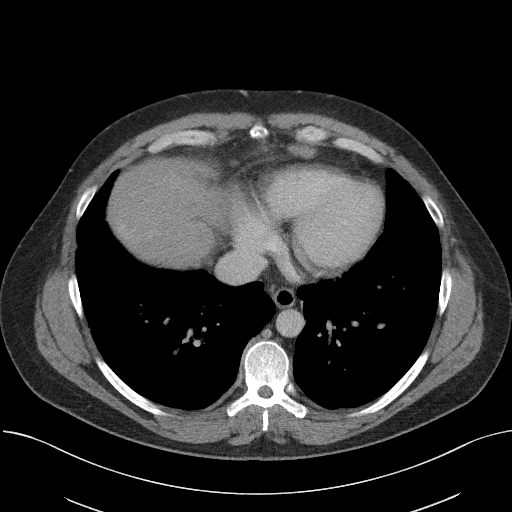

[Series 5: coronal st · coronal · 0.89mm/px · 3 of 109 slices shown]
[im 37/109  soft-tissue]
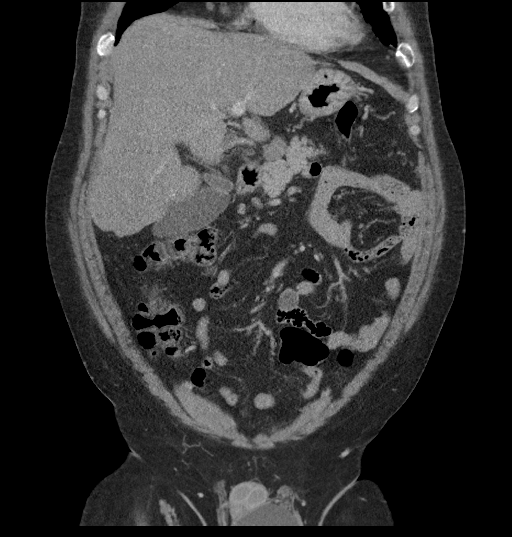
[im 49/109  soft-tissue]
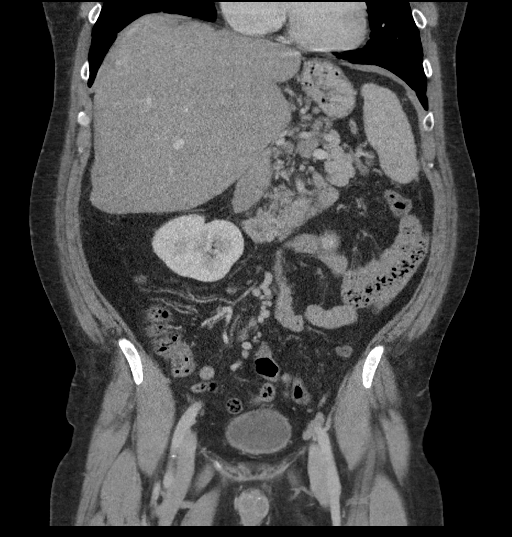
[im 61/109  soft-tissue]
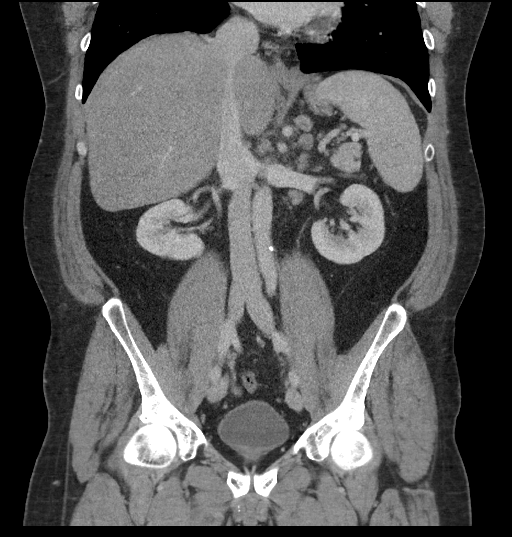

[16 of 46 positions shown; findings below may reference images not displayed]

FINDINGS: Lower chest: Previously seen right middle lobe nodule is not
appreciated due to the imaging technique. Mild bronchial thickening
is noted and stable.

Hepatobiliary: Diffuse decreased attenuation is noted within the
liver consistent with fatty infiltration. Mild nodularity is noted
as well. Gallbladder is unremarkable.

Pancreas: Unremarkable. No pancreatic ductal dilatation or
surrounding inflammatory changes.

Spleen: Normal in size without focal abnormality.

Adrenals/Urinary Tract: Adrenal glands are within normal limits.
Kidneys demonstrate a normal enhancement pattern bilaterally. No
renal calculi or obstructive changes are seen. The bladder is
decompressed.

Stomach/Bowel: The appendix is within normal limits. No obstructive
or inflammatory changes of the colon are seen. Stomach is
unremarkable. Small bowel is within normal limits.

Vascular/Lymphatic: Atherosclerotic calcifications of the aorta are
noted without aneurysmal dilatation multiple lymph nodes are
identified throughout the abdomen. Largest of these is noted
adjacent to the celiac axis along the midportion of pancreas. These
appear stable from the prior exam are likely related to the
underlying clinical history of sarcoidosis.

Reproductive: Prostate is unremarkable. Left-sided hydrocele is
noted. This is stable from the prior exam.

Other: No abdominal wall hernia or abnormality. No abdominopelvic
ascites.

Musculoskeletal: No acute or significant osseous findings.
IMPRESSION: Chronic bronchial wall thickening likely related to the patient's
underlying history of sarcoidosis.

Diffuse abdominal adenopathy also consistent with the given clinical
history of sarcoidosis.

Nodular changes in the liver consistent with underlying cirrhosis.

Large left hydrocele.

## 2019-09-20 MED ORDER — PANTOPRAZOLE SODIUM 40 MG PO TBEC
40.0000 mg | DELAYED_RELEASE_TABLET | Freq: Every day | ORAL | 0 refills | Status: DC
Start: 2019-09-20 — End: 2019-12-11

## 2019-09-20 MED ORDER — HYDROMORPHONE HCL 1 MG/ML IJ SOLN
1.0000 mg | Freq: Once | INTRAMUSCULAR | Status: AC
  Administered 2019-09-20: 1 mg via INTRAVENOUS
  Filled 2019-09-20: qty 1

## 2019-09-20 MED ORDER — ONDANSETRON HCL 4 MG/2ML IJ SOLN
4.0000 mg | Freq: Once | INTRAMUSCULAR | Status: AC
  Administered 2019-09-20: 4 mg via INTRAVENOUS
  Filled 2019-09-20: qty 2

## 2019-09-20 MED ORDER — IOHEXOL 300 MG/ML  SOLN
100.0000 mL | Freq: Once | INTRAMUSCULAR | Status: AC | PRN
  Administered 2019-09-20: 100 mL via INTRAVENOUS

## 2019-09-20 MED ORDER — PANTOPRAZOLE SODIUM 40 MG IV SOLR
40.0000 mg | Freq: Once | INTRAVENOUS | Status: AC
  Administered 2019-09-20: 40 mg via INTRAVENOUS
  Filled 2019-09-20: qty 40

## 2019-09-20 MED ORDER — SODIUM CHLORIDE 0.9 % IV BOLUS
1000.0000 mL | Freq: Once | INTRAVENOUS | Status: AC
  Administered 2019-09-20: 1000 mL via INTRAVENOUS

## 2019-09-20 MED ORDER — ONDANSETRON 8 MG PO TBDP
8.0000 mg | ORAL_TABLET | Freq: Three times a day (TID) | ORAL | 0 refills | Status: DC | PRN
Start: 2019-09-20 — End: 2019-12-11

## 2019-09-20 NOTE — Discharge Instructions (Addendum)
It was our pleasure to provide your ER care today - we hope that you feel better.  Take protonix (acid blocker medication) as prescribed. You may also try pepcid or maalox for symptom relief. You may take zofran as need for nausea.   Follow up with GI doctor in the next 1-2 weeks - call office to arrange appointment.  Also follow up with primary care doctor for blood pressure, as it is mildly high tonight.   Return to ER if worse, new symptoms, fevers, persistent vomiting, worsening or severe pain, or other concern.   You were given pain medication in the ER - no driving for the next 6 hours.

## 2019-09-20 NOTE — ED Provider Notes (Signed)
[facility] Provider Note   CSN: [id number] Arrival date & time: 09/19/19  2207     History Chief Complaint  Patient presents with  . Abdominal Pain    Trevor Reilly is a 49 y.o. male.  Patient c/o upper abd pain for the past week. Symptoms acute onset, moderate, constant, dull, non radiating, worse w eating. Denies hx same pain. No hx pud, pancreatitis or gallstones. Intermittent nausea/vomiting. Is having normal bms, no constipation. No melena or rectal bleeding. Denies chest pain or sob. No cough or uri symptoms. No fever or chills.   The history is provided by the patient.  Abdominal Pain Associated symptoms: nausea and vomiting   Associated symptoms: no chest pain, no chills, no constipation, no cough, no dysuria, no fever, no shortness of breath and no sore throat        Past Medical History:  Diagnosis Date  . Allergy   . Bronchitis   . Depression   . ED (erectile dysfunction)   . Fatty liver   . History of rectal bleeding   . Hydrocele   . Reflux   . Sarcoidosis   . Testicular tumor 2007    There are no problems to display for this patient.   Past Surgical History:  Procedure Laterality Date  . arm surgery    . LUNG BIOPSY         Family History  Problem Relation Age of Onset  . Gout Mother   . Hypertension Mother   . Thyroid disease Mother   . Stroke Father   . Heart attack Father   . Thyroid disease Sister   . Hypertension Brother   . Sarcoidosis Cousin     Social History   Tobacco Use  . Smoking status: Current Every Day Smoker    Packs/day: 0.50    Years: 25.00    Pack years: 12.50    Types: Cigarettes  . Smokeless tobacco: Never Used  Vaping Use  . Vaping Use: Never used  Substance Use Topics  . Alcohol use: Yes  . Drug use: No    Home Medications Prior to Admission medications   Medication Sig Start Date End Date Taking? Authorizing Provider  acetaminophen (TYLENOL) 500 MG tablet Take 2  tablets (1,000 mg total) by mouth every 8 (eight) hours as needed for moderate pain. 09/27/18   Law, Bea Graff, PA-C  albuterol (VENTOLIN HFA) 108 (90 Base) MCG/ACT inhaler Inhale into the lungs. 07/18/15   [provider]  ibuprofen (ADVIL) 600 MG tablet Take 1 tablet (600 mg total) by mouth every 6 (six) hours as needed. 09/27/18   Frederica Kuster, PA-C  sildenafil (VIAGRA) 50 MG tablet Take 1 tablet (50 mg total) by mouth daily as needed for erectile dysfunction. Max one tablet per 24 hours. 06/19/16   Saguier, Percell Miller, PA-C    Allergies    Patient has no known allergies.  Review of Systems   Review of Systems  Constitutional: Negative for chills and fever.  HENT: Negative for sore throat.   Eyes: Negative for redness.  Respiratory: Negative for cough and shortness of breath.   Cardiovascular: Negative for chest pain.  Gastrointestinal: Positive for abdominal pain, nausea and vomiting. Negative for constipation.  Endocrine: Negative for polyuria.  Genitourinary: Negative for dysuria and flank pain.  Musculoskeletal: Negative for back pain.  Skin: Negative for rash.  Neurological: Negative for headaches.  Hematological: Does not bruise/bleed easily.  Psychiatric/Behavioral: Negative for confusion.  Physical Exam Updated Vital Signs BP (!) 140/99   Pulse 87   Temp 98.6 F (37 C) (Oral)   Resp 18   Ht 1.753 m (5\' 9" )   Wt (!) 93.4 kg   SpO2 100%   BMI 30.41 kg/m   Physical Exam Vitals and nursing note reviewed.  Constitutional:      Appearance: Normal appearance. He is well-developed.  HENT:     Head: Atraumatic.     Nose: Nose normal.     Mouth/Throat:     Mouth: Mucous membranes are moist.     Pharynx: Oropharynx is clear.  Eyes:     General: No scleral icterus.    Conjunctiva/sclera: Conjunctivae normal.  Neck:     Trachea: No tracheal deviation.  Cardiovascular:     Rate and Rhythm: Normal rate and regular rhythm.     Pulses: Normal pulses.      Heart sounds: Normal heart sounds. No murmur heard.  No friction rub. No gallop.   Pulmonary:     Effort: Pulmonary effort is normal. No accessory muscle usage or respiratory distress.     Breath sounds: Normal breath sounds.  Abdominal:     General: Bowel sounds are normal. There is no distension.     Palpations: Abdomen is soft. There is no mass.     Tenderness: There is abdominal tenderness. There is no guarding or rebound.     Hernia: No hernia is present.     Comments: Upper abd tenderness.   Genitourinary:    Comments: No cva tenderness. Musculoskeletal:        General: No swelling or tenderness.     Cervical back: Normal range of motion and neck supple. No rigidity.  Skin:    General: Skin is warm and dry.     Findings: No rash.  Neurological:     Mental Status: He is alert.     Comments: Alert, speech clear.   Psychiatric:        Mood and Affect: Mood normal.     ED Results / Procedures / Treatments   Labs (all labs ordered are listed, but only abnormal results are displayed) Results for orders placed or performed during the hospital encounter of 09/20/19  CBC with Differential  Result Value Ref Range   WBC 7.8 4.0 - 10.5 K/uL   RBC 4.92 4.22 - 5.81 MIL/uL   Hemoglobin 15.8 13.0 - 17.0 g/dL   HCT 47.9 39 - 52 %   MCV 97.4 80.0 - 100.0 fL   MCH 32.1 26.0 - 34.0 pg   MCHC 33.0 30.0 - 36.0 g/dL   RDW 13.8 11.5 - 15.5 %   Platelets 199 150 - 400 K/uL   nRBC 0.0 0.0 - 0.2 %   Neutrophils Relative % 67 %   Neutro Abs 5.2 1.7 - 7.7 K/uL   Lymphocytes Relative 22 %   Lymphs Abs 1.7 0.7 - 4.0 K/uL   Monocytes Relative 7 %   Monocytes Absolute 0.6 0 - 1 K/uL   Eosinophils Relative 3 %   Eosinophils Absolute 0.2 0 - 0 K/uL   Basophils Relative 1 %   Basophils Absolute 0.1 0 - 0 K/uL   Immature Granulocytes 0 %   Abs Immature Granulocytes 0.02 0.00 - 0.07 K/uL  Comprehensive metabolic panel  Result Value Ref Range   Sodium 136 135 - 145 mmol/L   Potassium 3.5 3.5  - 5.1 mmol/L   Chloride 98 98 - 111 mmol/L  CO2 26 22 - 32 mmol/L   Glucose, Bld 111 (H) 70 - 99 mg/dL   BUN <5 (L) 6 - 20 mg/dL   Creatinine, Ser 0.71 0.61 - 1.24 mg/dL   Calcium 8.5 (L) 8.9 - 10.3 mg/dL   Total Protein 8.9 (H) 6.5 - 8.1 g/dL   Albumin 3.2 (L) 3.5 - 5.0 g/dL   AST 119 (H) 15 - 41 U/L   ALT 22 0 - 44 U/L   Alkaline Phosphatase 176 (H) 38 - 126 U/L   Total Bilirubin 1.3 (H) 0.3 - 1.2 mg/dL   GFR calc non Af Amer >60 >60 mL/min   GFR calc Af Amer >60 >60 mL/min   Anion gap 12 5 - 15  Lipase, blood  Result Value Ref Range   Lipase 28 11 - 51 U/L  Urinalysis, Routine w reflex microscopic  Result Value Ref Range   Color, Urine YELLOW YELLOW   APPearance CLEAR CLEAR   Specific Gravity, Urine 1.020 1.005 - 1.030   pH 5.5 5.0 - 8.0   Glucose, UA NEGATIVE NEGATIVE mg/dL   Hgb urine dipstick MODERATE (A) NEGATIVE   Bilirubin Urine NEGATIVE NEGATIVE   Ketones, ur NEGATIVE NEGATIVE mg/dL   Protein, ur NEGATIVE NEGATIVE mg/dL   Nitrite NEGATIVE NEGATIVE   Leukocytes,Ua NEGATIVE NEGATIVE  Occult blood card to lab, stool  Result Value Ref Range   Fecal Occult Bld NEGATIVE NEGATIVE  Urinalysis, Microscopic (reflex)  Result Value Ref Range   RBC / HPF 6-10 0 - 5 RBC/hpf   WBC, UA 0-5 0 - 5 WBC/hpf   Bacteria, UA RARE (A) NONE SEEN   Squamous Epithelial / LPF 0-5 0 - 5    EKG None  Radiology CT Abdomen Pelvis W Contrast  Result Date: 09/20/2019 CLINICAL DATA:  Generalized abdominal pain for 1 week EXAM: CT ABDOMEN AND PELVIS WITH CONTRAST TECHNIQUE: Multidetector CT imaging of the abdomen and pelvis was performed using the standard protocol following bolus administration of intravenous contrast. CONTRAST:  160mL OMNIPAQUE IOHEXOL 300 MG/ML  SOLN COMPARISON:  05/18/2016 FINDINGS: Lower chest: Previously seen right middle lobe nodule is not appreciated due to the imaging technique. Mild bronchial thickening is noted and stable. Hepatobiliary: Diffuse decreased  attenuation is noted within the liver consistent with fatty infiltration. Mild nodularity is noted as well. Gallbladder is unremarkable. Pancreas: Unremarkable. No pancreatic ductal dilatation or surrounding inflammatory changes. Spleen: Normal in size without focal abnormality. Adrenals/Urinary Tract: Adrenal glands are within normal limits. Kidneys demonstrate a normal enhancement pattern bilaterally. No renal calculi or obstructive changes are seen. The bladder is decompressed. Stomach/Bowel: The appendix is within normal limits. No obstructive or inflammatory changes of the colon are seen. Stomach is unremarkable. Small bowel is within normal limits. Vascular/Lymphatic: Atherosclerotic calcifications of the aorta are noted without aneurysmal dilatation multiple lymph nodes are identified throughout the abdomen. Largest of these is noted adjacent to the celiac axis along the midportion of pancreas. These appear stable from the prior exam are likely related to the underlying clinical history of sarcoidosis. Reproductive: Prostate is unremarkable. Left-sided hydrocele is noted. This is stable from the prior exam. Other: No abdominal wall hernia or abnormality. No abdominopelvic ascites. Musculoskeletal: No acute or significant osseous findings. IMPRESSION: Chronic bronchial wall thickening likely related to the patient's underlying history of sarcoidosis. Diffuse abdominal adenopathy also consistent with the given clinical history of sarcoidosis. Nodular changes in the liver consistent with underlying cirrhosis. Large left hydrocele. Electronically Signed   By: Elta Guadeloupe  Lukens M.D.   On: 09/20/2019 02:56    Procedures Procedures (including critical care time)  Medications Ordered in ED Medications  sodium chloride 0.9 % bolus 1,000 mL (1,000 mLs Intravenous New Bag/Given 09/20/19 0200)  HYDROmorphone (DILAUDID) injection 1 mg (1 mg Intravenous Given 09/20/19 0157)  ondansetron (ZOFRAN) injection 4 mg (4 mg  Intravenous Given 09/20/19 0155)  pantoprazole (PROTONIX) injection 40 mg (40 mg Intravenous Given 09/20/19 0156)    ED Course  I have reviewed the triage vital signs and the nursing notes.  Pertinent labs & imaging results that were available during my care of the patient were reviewed by me and considered in my medical decision making (see chart for details).    MDM Rules/Calculators/A&P                          Stat labs. Iv ns bolus. protonix iv, zofran iv, dilaudid iv.   Reviewed nursing notes and prior charts for additional history. Note made of prior imaging 2018/2019 for upper abd pain, ct neg for acute process then, and u/s neg for gallstones.   Labs reviewed/interpreted by me - wbc normal, hgb normal. Lytes normal. Mild elev Ast > Alt.  Lipase normal.  CT ordered.   Ct neg acute process.   Recheck pt, comfortable appearing, abd soft nt, no emesis in ED, stool is heme neg. Vitals normal.  Pt currently appears stable for d/c.   Rec gi f/u. RX protonix for home.   Return precautions provided.    Final Clinical Impression(s) / ED Diagnoses Final diagnoses:  None    Rx / DC Orders ED Discharge Orders    None       Lajean Saver, MD 09/20/19 [phone]

## 2019-09-20 NOTE — ED Notes (Signed)
ED Provider at bedside. 

## 2019-12-07 ENCOUNTER — Other Ambulatory Visit: Payer: Self-pay | Admitting: Nurse Practitioner

## 2019-12-07 DIAGNOSIS — N5082 Scrotal pain: Secondary | ICD-10-CM

## 2019-12-07 DIAGNOSIS — N5089 Other specified disorders of the male genital organs: Secondary | ICD-10-CM

## 2019-12-11 ENCOUNTER — Other Ambulatory Visit: Payer: Self-pay

## 2019-12-11 ENCOUNTER — Emergency Department (HOSPITAL_BASED_OUTPATIENT_CLINIC_OR_DEPARTMENT_OTHER)
Admission: EM | Admit: 2019-12-11 | Discharge: 2019-12-11 | Disposition: A | Discharge status: Home / Self Care | Payer: Medicaid Other | Attending: Emergency Medicine | Admitting: Emergency Medicine

## 2019-12-11 ENCOUNTER — Encounter (HOSPITAL_BASED_OUTPATIENT_CLINIC_OR_DEPARTMENT_OTHER): Payer: Self-pay | Admitting: Emergency Medicine

## 2019-12-11 DIAGNOSIS — Z8547 Personal history of malignant neoplasm of testis: Secondary | ICD-10-CM | POA: Insufficient documentation

## 2019-12-11 DIAGNOSIS — R110 Nausea: Secondary | ICD-10-CM | POA: Insufficient documentation

## 2019-12-11 DIAGNOSIS — Z87891 Personal history of nicotine dependence: Secondary | ICD-10-CM | POA: Insufficient documentation

## 2019-12-11 DIAGNOSIS — R630 Anorexia: Secondary | ICD-10-CM | POA: Insufficient documentation

## 2019-12-11 DIAGNOSIS — R1013 Epigastric pain: Secondary | ICD-10-CM | POA: Insufficient documentation

## 2019-12-11 DIAGNOSIS — I10 Essential (primary) hypertension: Secondary | ICD-10-CM | POA: Insufficient documentation

## 2019-12-11 DIAGNOSIS — K029 Dental caries, unspecified: Secondary | ICD-10-CM | POA: Insufficient documentation

## 2019-12-11 HISTORY — DX: Essential (primary) hypertension: I10

## 2019-12-11 MED ORDER — ONDANSETRON 4 MG PO TBDP
4.0000 mg | ORAL_TABLET | Freq: Three times a day (TID) | ORAL | 0 refills | Status: DC | PRN
Start: 2019-12-11 — End: 2020-05-22

## 2019-12-11 MED ORDER — ALUM & MAG HYDROXIDE-SIMETH 200-200-20 MG/5ML PO SUSP
30.0000 mL | Freq: Once | ORAL | Status: AC
  Administered 2019-12-11: 30 mL via ORAL
  Filled 2019-12-11: qty 30

## 2019-12-11 MED ORDER — CLINDAMYCIN HCL 300 MG PO CAPS
300.0000 mg | ORAL_CAPSULE | Freq: Three times a day (TID) | ORAL | 0 refills | Status: AC
Start: 2019-12-11 — End: 2019-12-21

## 2019-12-11 MED ORDER — PANTOPRAZOLE SODIUM 40 MG PO TBEC
40.0000 mg | DELAYED_RELEASE_TABLET | Freq: Every day | ORAL | 0 refills | Status: DC
Start: 2019-12-11 — End: 2021-05-07

## 2019-12-11 MED ORDER — SUCRALFATE 1 G PO TABS
1.0000 g | ORAL_TABLET | Freq: Three times a day (TID) | ORAL | 0 refills | Status: DC
Start: 2019-12-11 — End: 2020-05-22

## 2019-12-11 MED ORDER — LIDOCAINE VISCOUS HCL 2 % MT SOLN
15.0000 mL | Freq: Once | OROMUCOSAL | Status: AC
  Administered 2019-12-11: 15 mL via ORAL
  Filled 2019-12-11: qty 15

## 2019-12-11 NOTE — ED Triage Notes (Signed)
Pt reports right lower dental pain x 7 days, endorses using ibuprofen for dental pain now reports epigastric pain x 3 days. Pt reports 1 episode  hemoptysis on Thursday. Endorse nausea, denies emesis at this time

## 2019-12-11 NOTE — ED Provider Notes (Signed)
[facility] Provider Note   CSN: [id number] Arrival date & time: 12/11/19  0901     History Chief Complaint  Patient presents with  . Abdominal Pain  . Dental Pain    Trevor Reilly is a 49 y.o. male.  The history is provided by the patient.  Abdominal Pain Pain location:  Epigastric Pain quality: aching   Pain radiates to:  Does not radiate Pain severity:  Mild Onset quality:  Gradual Timing:  Intermittent Progression:  Waxing and waning Chronicity:  Recurrent Context comment:  History of reflux, some recent NSAID use but no alochol use, also here for right lower dental pain Relieved by:  Nothing Worsened by:  Nothing Associated symptoms: anorexia and nausea   Associated symptoms: no belching, no chest pain, no chills, no constipation, no cough, no diarrhea, no dysuria, no fatigue, no fever, no hematemesis, no hematochezia, no hematuria, no melena, no shortness of breath, no sore throat and no vomiting   Risk factors: NSAID use   Risk factors: no alcohol abuse and has not had multiple surgeries   Dental Pain Associated symptoms: no fever        Past Medical History:  Diagnosis Date  . Allergy   . Bronchitis   . Depression   . ED (erectile dysfunction)   . Fatty liver   . History of rectal bleeding   . Hydrocele   . Hypertension   . Reflux   . Sarcoidosis   . Testicular tumor 2007    There are no problems to display for this patient.   Past Surgical History:  Procedure Laterality Date  . arm surgery    . LUNG BIOPSY         Family History  Problem Relation Age of Onset  . Gout Mother   . Hypertension Mother   . Thyroid disease Mother   . Stroke Father   . Heart attack Father   . Thyroid disease Sister   . Hypertension Brother   . Sarcoidosis Cousin     Social History   Tobacco Use  . Smoking status: Former Smoker    Packs/day: 0.50    Years: 25.00    Pack years: 12.50    Types: Cigarettes  . Smokeless  tobacco: Never Used  Vaping Use  . Vaping Use: Never used  Substance Use Topics  . Alcohol use: Yes  . Drug use: No    Home Medications Prior to Admission medications   Medication Sig Start Date End Date Taking? Authorizing Provider  acetaminophen (TYLENOL) 500 MG tablet Take 2 tablets (1,000 mg total) by mouth every 8 (eight) hours as needed for moderate pain. 09/27/18   Law, Bea Graff, PA-C  albuterol (VENTOLIN HFA) 108 (90 Base) MCG/ACT inhaler Inhale into the lungs. 07/18/15   [provider]  clindamycin (CLEOCIN) 300 MG capsule Take 1 capsule (300 mg total) by mouth 3 (three) times daily for 10 days. 12/11/19 12/21/19  Teana Lindahl, DO  ibuprofen (ADVIL) 600 MG tablet Take 1 tablet (600 mg total) by mouth every 6 (six) hours as needed. 09/27/18   Law, Bea Graff, PA-C  ondansetron (ZOFRAN ODT) 4 MG disintegrating tablet Take 1 tablet (4 mg total) by mouth every 8 (eight) hours as needed for up to 20 doses for nausea or vomiting. 12/11/19   Chrystal Zeimet, DO  pantoprazole (PROTONIX) 40 MG tablet Take 1 tablet (40 mg total) by mouth daily. 12/11/19   Cailyn Houdek, DO  sildenafil (VIAGRA) 50  MG tablet Take 1 tablet (50 mg total) by mouth daily as needed for erectile dysfunction. Max one tablet per 24 hours. 06/19/16   Saguier, Percell Miller, PA-C  sucralfate (CARAFATE) 1 g tablet Take 1 tablet (1 g total) by mouth 4 (four) times daily -  with meals and at bedtime for 14 days. 12/11/19 12/25/19  Lennice Sites, DO    Allergies    Patient has no known allergies.  Review of Systems   Review of Systems  Constitutional: Negative for chills, fatigue and fever.  HENT: Positive for dental problem. Negative for ear pain and sore throat.   Eyes: Negative for pain and visual disturbance.  Respiratory: Negative for cough and shortness of breath.   Cardiovascular: Negative for chest pain and palpitations.  Gastrointestinal: Positive for abdominal pain, anorexia and nausea. Negative for  constipation, diarrhea, hematemesis, hematochezia, melena and vomiting.  Genitourinary: Negative for dysuria and hematuria.  Musculoskeletal: Negative for arthralgias and back pain.  Skin: Negative for color change and rash.  Neurological: Negative for seizures and syncope.  All other systems reviewed and are negative.   Physical Exam Updated Vital Signs  Physical Exam Vitals and nursing note reviewed.  Constitutional:      General: He is not in acute distress.    Appearance: He is well-developed. He is not ill-appearing.  HENT:     Head: Normocephalic and atraumatic.     Comments: Dental caries throughout but worse in the right lower quadrant no obvious abscess    Mouth/Throat:     Mouth: Mucous membranes are moist.  Eyes:     Extraocular Movements: Extraocular movements intact.     Conjunctiva/sclera: Conjunctivae normal.  Cardiovascular:     Rate and Rhythm: Normal rate and regular rhythm.     Heart sounds: Normal heart sounds. No murmur heard.   Pulmonary:     Effort: Pulmonary effort is normal. No respiratory distress.     Breath sounds: Normal breath sounds.  Abdominal:     General: Abdomen is flat. There is no distension.     Palpations: Abdomen is soft.     Tenderness: There is abdominal tenderness in the epigastric area.  Musculoskeletal:     Cervical back: Neck supple.  Skin:    General: Skin is warm and dry.     Capillary Refill: Capillary refill takes less than 2 seconds.  Neurological:     General: No focal deficit present.     Mental Status: He is alert.  Psychiatric:        Mood and Affect: Mood normal.     ED Results / Procedures / Treatments   Labs (all labs ordered are listed, but only abnormal results are displayed) Labs Reviewed - No data to display  EKG None  Radiology No results found.  Procedures Procedures (including critical care time)  Medications Ordered in ED Medications  alum & mag hydroxide-simeth (MAALOX/MYLANTA)  200-200-20 MG/5ML suspension 30 mL (has no administration in time range)    And  lidocaine (XYLOCAINE) 2 % viscous mouth solution 15 mL (has no administration in time range)    ED Course  I have reviewed the triage vital signs and the nursing notes.  Pertinent labs & imaging results that were available during my care of the patient were reviewed by me and considered in my medical decision making (see chart for details).    MDM Rules/Calculators/A&P  Trevor Reilly is a 49 year old male with history of reflux who presents the ED with dental pain, epigastric abdominal pain.  Normal vitals.  No fever.  Right lower dental pain for the last week.  Dental caries on exam.  No obvious abscess in the mouth.  Does have some redness to the right lower gumline.  Will prescribe antibiotics and have him follow-up with dentist.  Patient has had some intermittent mild epigastric abdominal pain.  Consistent reflux.  Denies any recent alcohol abuse.  Doubt pancreatitis.  No tenderness in the right upper quadrant and doubt hepatobiliary process.  States he is passing gas, no history of major abdominal surgeries.  Doubt bowel obstruction.  Was given GI cocktail, prescription for Protonix, Carafate, Zofran, clindamycin.  Understands return precautions but at this time do not believe he needs any further work-up.  Discharged in good condition.  This chart was dictated using voice recognition software.  Despite best efforts to proofread,  errors can occur which can change the documentation meaning.     Final Clinical Impression(s) / ED Diagnoses Final diagnoses:  Epigastric pain  Pain due to dental caries    Rx / DC Orders ED Discharge Orders         Ordered    ondansetron (ZOFRAN ODT) 4 MG disintegrating tablet  Every 8 hours PRN        12/11/19 0933    pantoprazole (PROTONIX) 40 MG tablet  Daily        12/11/19 0933    sucralfate (CARAFATE) 1 g tablet  3 times daily with meals &  bedtime        12/11/19 0933    clindamycin (CLEOCIN) 300 MG capsule  3 times daily        12/11/19 0933           Lennice Sites, DO 12/11/19 [phone]

## 2019-12-24 ENCOUNTER — Ambulatory Visit
Admission: RE | Admit: 2019-12-24 | Discharge: 2019-12-24 | Disposition: A | Discharge status: Home / Self Care | Payer: No Typology Code available for payment source | Source: Ambulatory Visit | Attending: Nurse Practitioner | Admitting: Nurse Practitioner

## 2019-12-24 DIAGNOSIS — N5082 Scrotal pain: Secondary | ICD-10-CM

## 2019-12-24 DIAGNOSIS — N5089 Other specified disorders of the male genital organs: Secondary | ICD-10-CM

## 2019-12-24 IMAGING — US US SCROTUM W/ DOPPLER COMPLETE
1 series · 13 of 25 positions shown · non-contrast
Comparison: Ultrasound [DATE], CT [DATE]

CLINICAL DATA: One year scrotal swelling, pain intermittently for
over a year

EXAM:
SCROTAL ULTRASOUND
DOPPLER ULTRASOUND OF THE TESTICLES
TECHNIQUE: Complete ultrasound examination of the testicles, epididymis, and
other scrotal structures was performed. Color and spectral Doppler
ultrasound were also utilized to evaluate blood flow to the
testicles.

[Series 1: us scrotum w/ doppler complete · 0.10mm/px · 47 acquisitions, 13 frames shown]
[im 1/47]
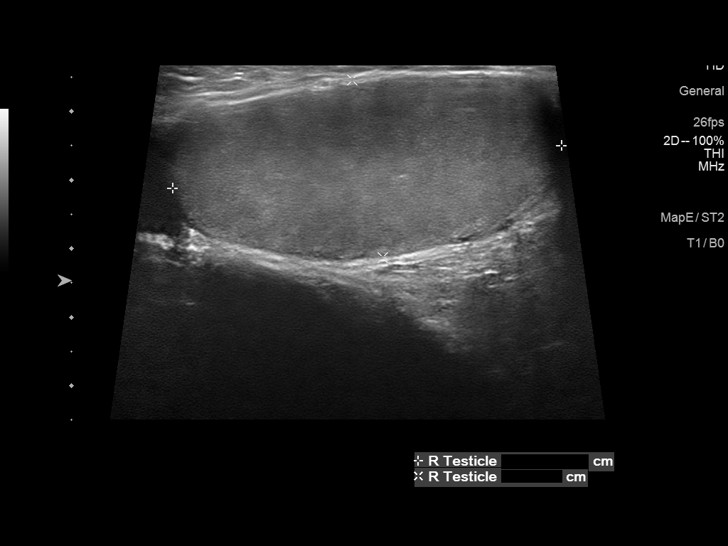
[im 4/47]
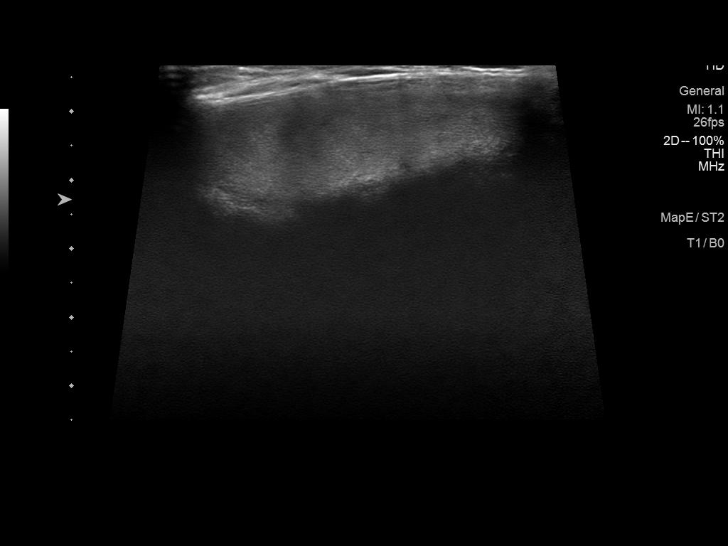
[im 8/47]
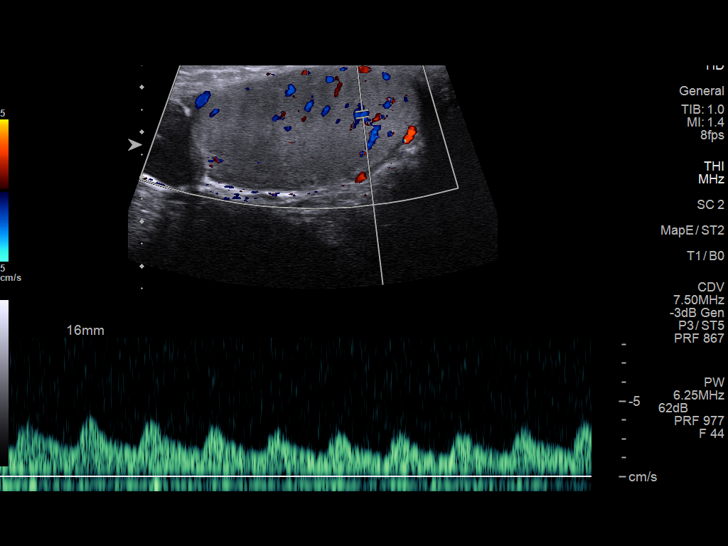
[im 12/47]
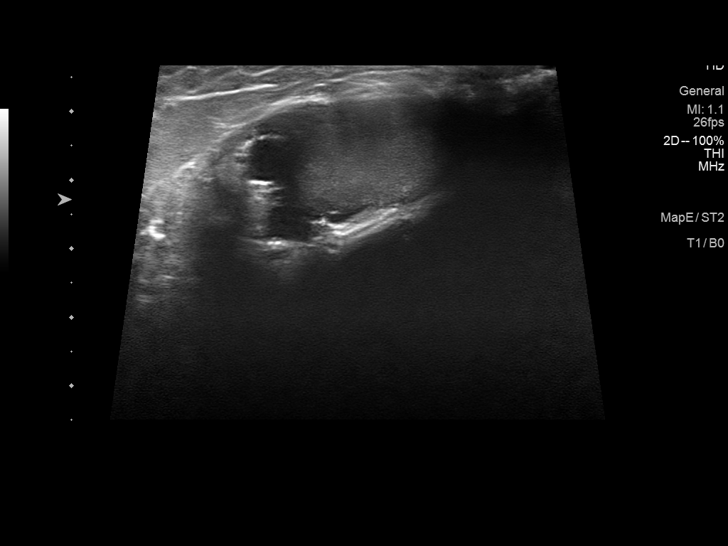
[im 16/47]
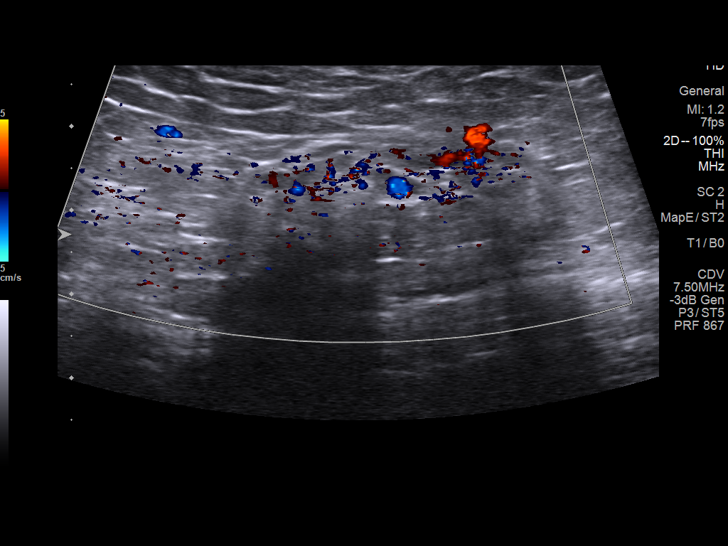
[im 20/47]
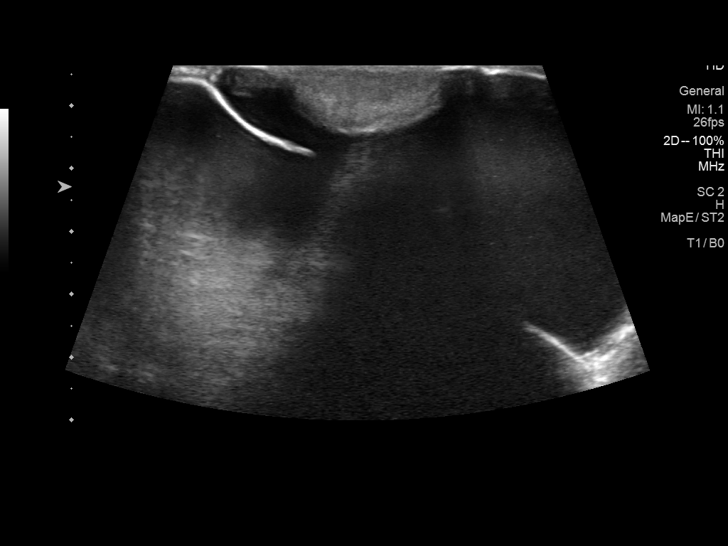
[im 24/47]
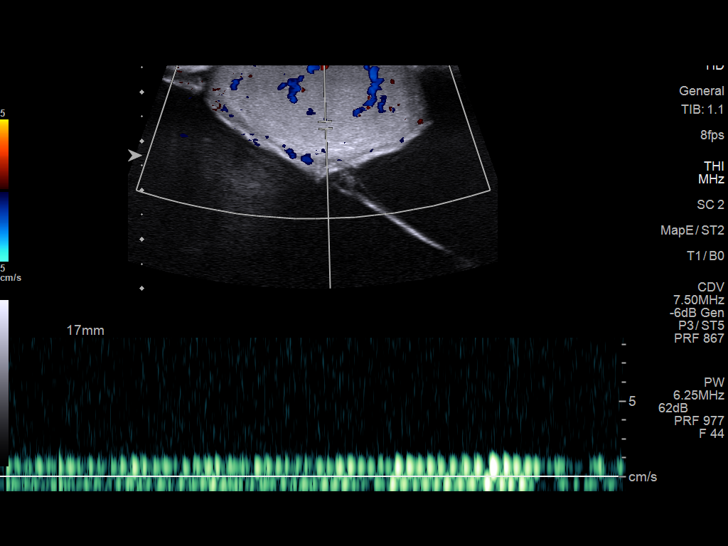
[im 27/47]
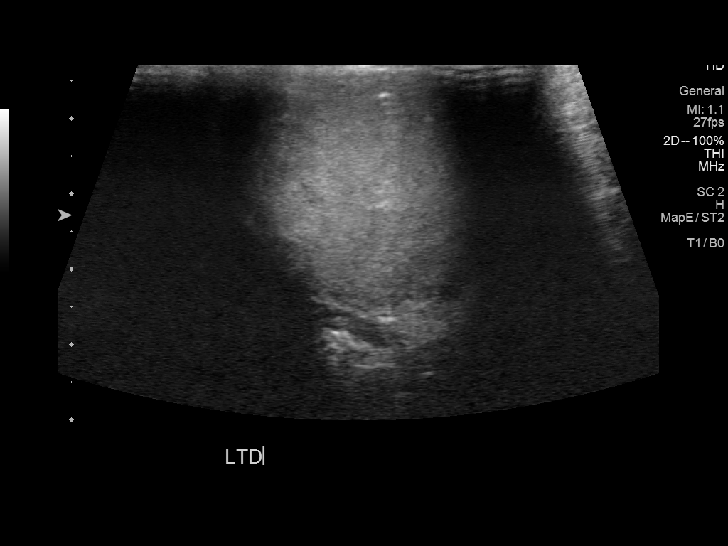
[im 31/47]
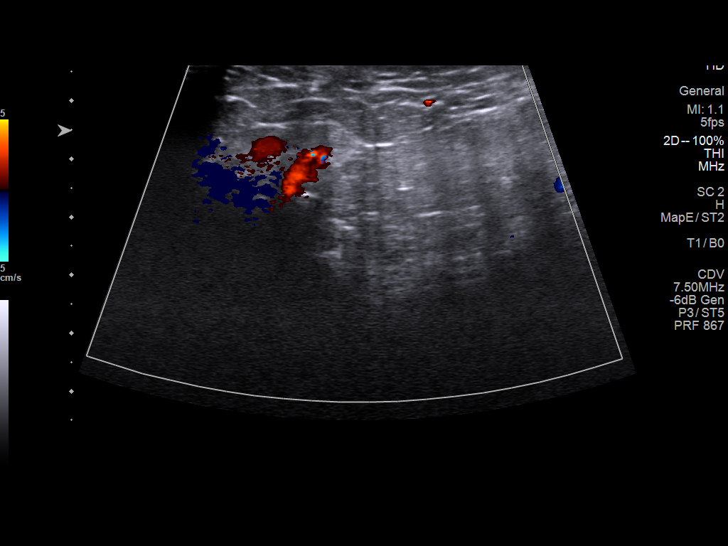
[im 35/47]
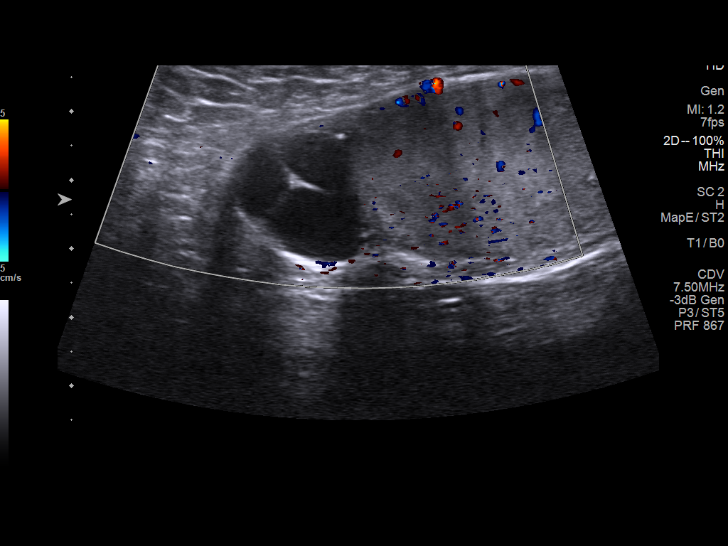
[im 39/47]
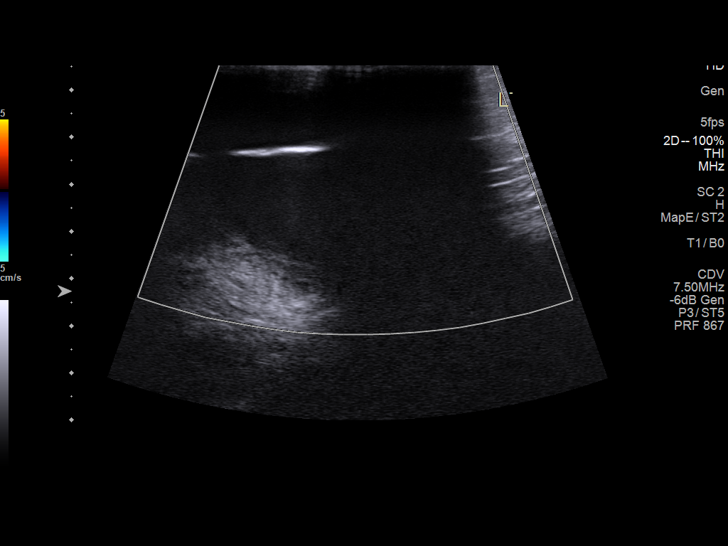
[im 43/47]
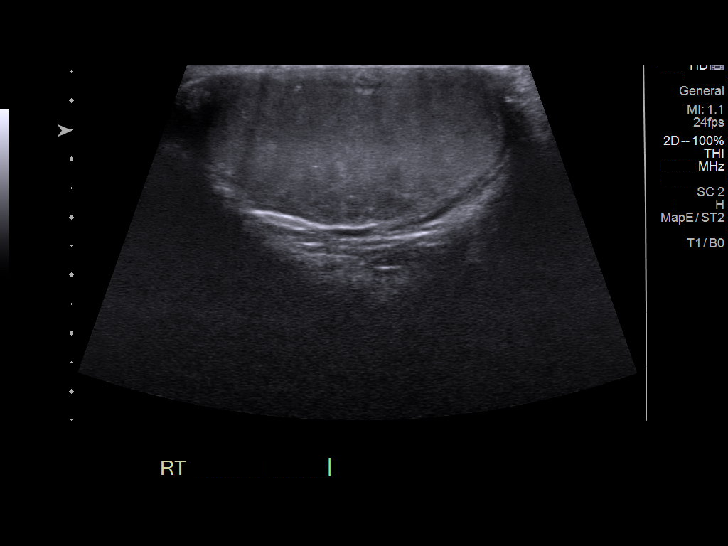
[im 47/47]
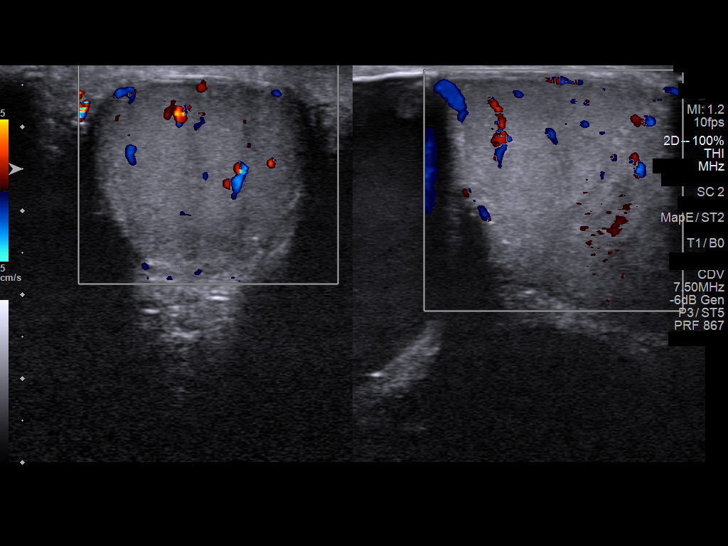

[13 of 25 positions shown; findings below may reference images not displayed]

FINDINGS: Right testicle

Measurements: 5.7 x 2.6 x 3.7 cm. Normal parenchyma. No mass or
microlithiasis.

Left testicle

Measurements: 4.7 x 2.2 x 3.2 cm. Normal parenchyma. No mass or
microlithiasis.

Right epididymis: Multi septate cyst versus conglomerate of cysts
measuring approximately 2.1 x 1.9 x 2.6 cm in size with an overall
similar appearance to comparison ultrasound from [KO].

Left epididymis: Poorly visualized due to presence of the large
presumed left hydrocele.

Hydrocele: Large predominantly anechoic collection in the left
scrotum with internal septations concerning for a large hydrocele
which is similar in appearance to the comparison exam though full
measurements are difficult to accurately ascertain given overall
size. Does appear somewhat increased grossly from comparison. Low
level internal echoes are present. No right hydrocele.

Varicocele:  None visualized.

Pulsed Doppler interrogation of both testes demonstrates normal low
resistance arterial and venous waveforms bilaterally.
IMPRESSION: 1. Predominantly anechoic collection of fluid in the left scrotum
which is extratesticular with internal septations and low-level
internal echoes, similar in configuration the likely enlarged in
size from the comparison exam from [KO]. Finding could reflect a
complex hydrocele or a large epididymal cyst or versus large
spermatocele.
2. Large septate epididymal head cyst versus conglomerate of smaller
cysts in the right epididymis, similar in appearance to comparison
ultrasound from [KO].
3. Normal sonographic appearance of the testes. No evidence of
torsion or epididymo-orchitis.

## 2020-04-13 ENCOUNTER — Emergency Department (HOSPITAL_BASED_OUTPATIENT_CLINIC_OR_DEPARTMENT_OTHER)
Admission: EM | Admit: 2020-04-13 | Discharge: 2020-04-13 | Disposition: A | Discharge status: Home / Self Care | Payer: BC Managed Care – PPO | Attending: Emergency Medicine | Admitting: Emergency Medicine

## 2020-04-13 ENCOUNTER — Other Ambulatory Visit: Payer: Self-pay

## 2020-04-13 ENCOUNTER — Emergency Department (HOSPITAL_BASED_OUTPATIENT_CLINIC_OR_DEPARTMENT_OTHER): Payer: BC Managed Care – PPO

## 2020-04-13 ENCOUNTER — Encounter (HOSPITAL_BASED_OUTPATIENT_CLINIC_OR_DEPARTMENT_OTHER): Payer: Self-pay | Admitting: *Deleted

## 2020-04-13 DIAGNOSIS — I10 Essential (primary) hypertension: Secondary | ICD-10-CM | POA: Diagnosis not present

## 2020-04-13 DIAGNOSIS — R059 Cough, unspecified: Secondary | ICD-10-CM | POA: Diagnosis present

## 2020-04-13 DIAGNOSIS — J189 Pneumonia, unspecified organism: Secondary | ICD-10-CM | POA: Insufficient documentation

## 2020-04-13 DIAGNOSIS — Z87891 Personal history of nicotine dependence: Secondary | ICD-10-CM | POA: Insufficient documentation

## 2020-04-13 DIAGNOSIS — Z8701 Personal history of pneumonia (recurrent): Secondary | ICD-10-CM

## 2020-04-13 HISTORY — DX: Personal history of pneumonia (recurrent): Z87.01

## 2020-04-13 LAB — COMPREHENSIVE METABOLIC PANEL
ALT: 21 U/L (ref 0–44)
AST: 119 U/L — ABNORMAL HIGH (ref 15–41)
Albumin: 3 g/dL — ABNORMAL LOW (ref 3.5–5.0)
Alkaline Phosphatase: 186 U/L — ABNORMAL HIGH (ref 38–126)
Anion gap: 10 (ref 5–15)
BUN: 5 mg/dL — ABNORMAL LOW (ref 6–20)
CO2: 23 mmol/L (ref 22–32)
Calcium: 8.3 mg/dL — ABNORMAL LOW (ref 8.9–10.3)
Chloride: 101 mmol/L (ref 98–111)
Creatinine, Ser: 0.64 mg/dL (ref 0.61–1.24)
GFR, Estimated: >60 mL/min (ref >60)
Glucose, Bld: 83 mg/dL (ref 70–99)
Potassium: 3.6 mmol/L (ref 3.5–5.1)
Sodium: 134 mmol/L — ABNORMAL LOW (ref 135–145)
Total Bilirubin: 1.4 mg/dL — ABNORMAL HIGH (ref 0.3–1.2)
Total Protein: 8.4 g/dL — ABNORMAL HIGH (ref 6.5–8.1)

## 2020-04-13 LAB — CBC WITH DIFFERENTIAL/PLATELET
Abs Immature Granulocytes: 0.02 10*3/uL (ref 0.00–0.07)
Basophils Absolute: 0.1 10*3/uL (ref 0.0–0.1)
Basophils Relative: 1 %
Eosinophils Absolute: 0.2 10*3/uL (ref 0.0–0.5)
Eosinophils Relative: 3 %
HCT: 44.4 % (ref 39.0–52.0)
Hemoglobin: 14.7 g/dL (ref 13.0–17.0)
Immature Granulocytes: 0 %
Lymphocytes Relative: 21 %
Lymphs Abs: 1.4 10*3/uL (ref 0.7–4.0)
MCH: 31.7 pg (ref 26.0–34.0)
MCHC: 33.1 g/dL (ref 30.0–36.0)
MCV: 95.9 fL (ref 80.0–100.0)
Monocytes Absolute: 0.7 10*3/uL (ref 0.1–1.0)
Monocytes Relative: 11 %
Neutro Abs: 4.2 10*3/uL (ref 1.7–7.7)
Neutrophils Relative %: 64 %
Platelets: 180 10*3/uL (ref 150–400)
RBC: 4.63 MIL/uL (ref 4.22–5.81)
RDW: 14.2 % (ref 11.5–15.5)
WBC: 6.6 10*3/uL (ref 4.0–10.5)
nRBC: 0 % (ref 0.0–0.2)

## 2020-04-13 LAB — APTT: aPTT: 32 seconds (ref 24–36)

## 2020-04-13 LAB — LACTIC ACID, PLASMA: Lactic Acid, Venous: 1.8 mmol/L (ref 0.5–1.9)

## 2020-04-13 LAB — PROTIME-INR
INR: 1.1 (ref 0.8–1.2)
Prothrombin Time: 14.2 seconds (ref 11.4–15.2)

## 2020-04-13 IMAGING — DX DG CHEST 2V
2 series · 2 of 2 positions shown · non-contrast
Comparison: [DATE]

CLINICAL DATA: Right pleuritic pain

EXAM:
CHEST - 2 VIEW

[chest pa]
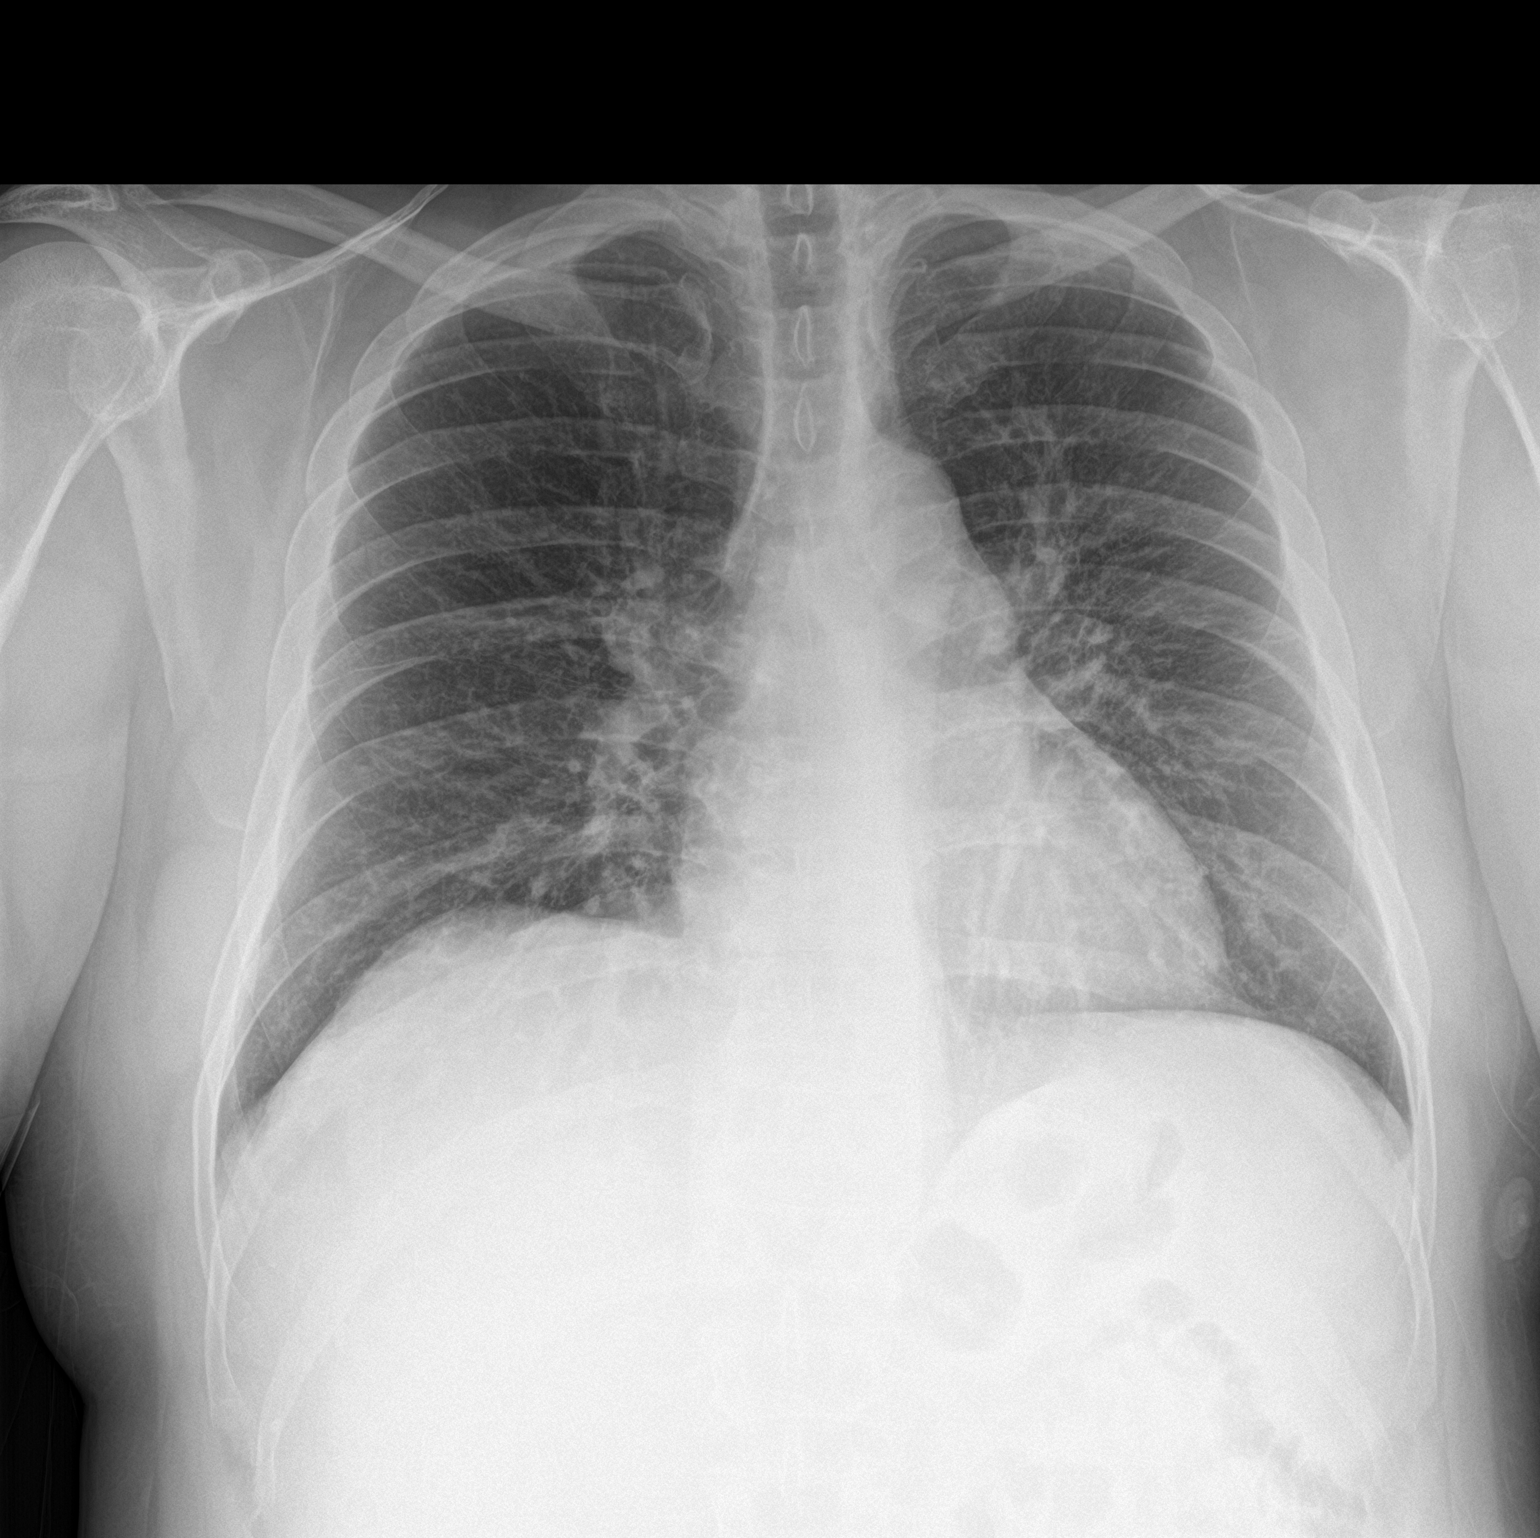

[chest lat]
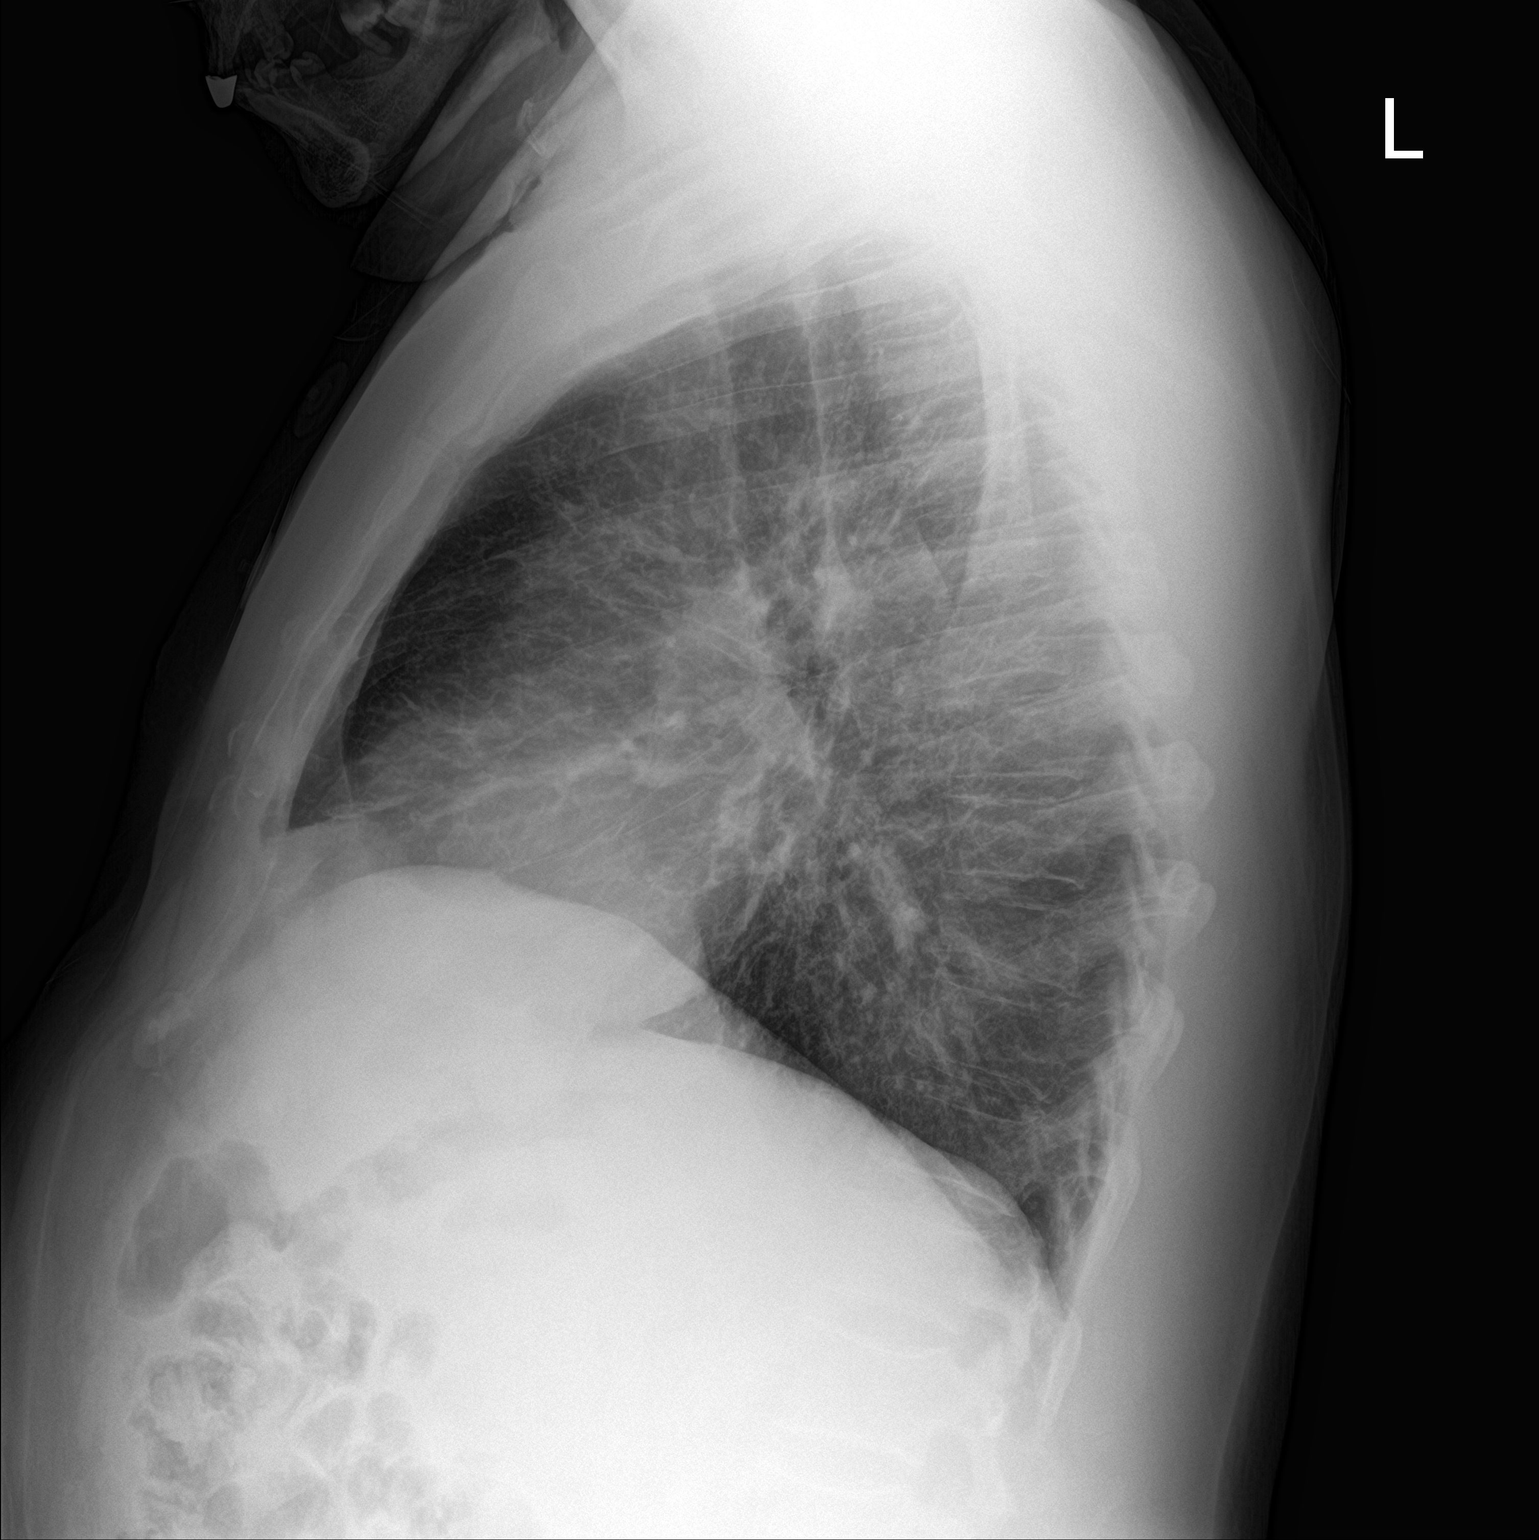

[2 of 2 positions shown; findings below may reference images not displayed]

FINDINGS: The heart size and mediastinal contours are within normal limits.
Chronic interstitial changes. No new consolidation edema. No pleural
effusion or pneumothorax. Age-indeterminate loss of height at the
inferior endplate of a lower thoracic vertebral body.
IMPRESSION: No acute process in the chest.

Age-indeterminate loss of height at the inferior endplate of a lower
thoracic vertebral body.

## 2020-04-13 MED ORDER — HYDROCODONE-ACETAMINOPHEN 5-325 MG PO TABS
1.0000 | ORAL_TABLET | Freq: Once | ORAL | Status: AC
  Administered 2020-04-13: 1 via ORAL
  Filled 2020-04-13: qty 1

## 2020-04-13 MED ORDER — ACETAMINOPHEN ER 650 MG PO TBCR
650.0000 mg | EXTENDED_RELEASE_TABLET | Freq: Three times a day (TID) | ORAL | 0 refills | Status: DC
Start: 2020-04-13 — End: 2021-09-28

## 2020-04-13 MED ORDER — LEVOFLOXACIN 500 MG PO TABS
500.0000 mg | ORAL_TABLET | Freq: Every day | ORAL | 0 refills | Status: DC
Start: 2020-04-14 — End: 2020-05-22

## 2020-04-13 MED ORDER — LEVOFLOXACIN 500 MG PO TABS
500.0000 mg | ORAL_TABLET | Freq: Once | ORAL | Status: AC
  Administered 2020-04-13: 500 mg via ORAL
  Filled 2020-04-13: qty 1

## 2020-04-13 NOTE — Discharge Instructions (Signed)
You are seen in the ER for cough and chest pain.  Lease take the antibiotics prescribed for what appears to be early pneumonia.  As discussed, return to the ER if you start having worsening chest pain, shortness of breath.  Please follow-up with your primary care doctor in 1 week.

## 2020-04-13 NOTE — ED Triage Notes (Signed)
Coughing, shortness of breath and right chest pain started yesterday.

## 2020-04-13 NOTE — ED Provider Notes (Addendum)
[place] EMERGENCY DEPARTMENT Provider Note   CSN: [id number] Arrival date & time: 04/13/20  5956     History Chief Complaint  Patient presents with  . Cough  . Shortness of Breath  . Chest Pain    Trevor Reilly is a 50 y.o. male.  HPI     50 year old male comes in with chief complaint of cough, shortness of breath and chest pain.  Patient has history of sarcoidosis.  He reports that he started having right-sided chest pain yesterday, along with cough.  Cough is producing green phlegm and there is a blood-tinged to it.  Patient has no nausea, vomiting, fevers, chills.  He has had pneumonia in the past, symptoms are similar.  He has no history of PE, DVT.  Patient is not very active, therefore he does not know if there is any exertional shortness of breath component to his pain.  Patient has received 2 COVID-19 vaccines.  He is not around anyone sick. Past Medical History:  Diagnosis Date  . Allergy   . Bronchitis   . Depression   . ED (erectile dysfunction)   . Fatty liver   . History of rectal bleeding   . Hydrocele   . Hypertension   . Reflux   . Sarcoidosis   . Testicular tumor 2007    There are no problems to display for this patient.   Past Surgical History:  Procedure Laterality Date  . arm surgery    . LUNG BIOPSY         Family History  Problem Relation Age of Onset  . Gout Mother   . Hypertension Mother   . Thyroid disease Mother   . Stroke Father   . Heart attack Father   . Thyroid disease Sister   . Hypertension Brother   . Sarcoidosis Cousin     Social History   Tobacco Use  . Smoking status: Former Smoker    Packs/day: 0.50    Years: 25.00    Pack years: 12.50    Types: Cigarettes  . Smokeless tobacco: Never Used  Vaping Use  . Vaping Use: Never used  Substance Use Topics  . Alcohol use: Not Currently    Comment: occasionally  . Drug use: No    Home Medications Prior to Admission medications   Medication Sig  Start Date End Date Taking? Authorizing Provider  acetaminophen (TYLENOL 8 HOUR) 650 MG CR tablet Take 1 tablet (650 mg total) by mouth every 8 (eight) hours. 04/13/20  Yes Varney Biles, MD  levofloxacin (LEVAQUIN) 500 MG tablet Take 1 tablet (500 mg total) by mouth daily. 04/14/20  Yes Varney Biles, MD  albuterol (VENTOLIN HFA) 108 (90 Base) MCG/ACT inhaler Inhale into the lungs. 07/18/15   [provider]  ibuprofen (ADVIL) 600 MG tablet Take 1 tablet (600 mg total) by mouth every 6 (six) hours as needed. 09/27/18   Law, Bea Graff, PA-C  ondansetron (ZOFRAN ODT) 4 MG disintegrating tablet Take 1 tablet (4 mg total) by mouth every 8 (eight) hours as needed for up to 20 doses for nausea or vomiting. 12/11/19   Curatolo, Adam, DO  pantoprazole (PROTONIX) 40 MG tablet Take 1 tablet (40 mg total) by mouth daily. 12/11/19   Curatolo, Adam, DO  sildenafil (VIAGRA) 50 MG tablet Take 1 tablet (50 mg total) by mouth daily as needed for erectile dysfunction. Max one tablet per 24 hours. 06/19/16   Saguier, Percell Miller, PA-C  sucralfate (CARAFATE) 1 g tablet Take 1  tablet (1 g total) by mouth 4 (four) times daily -  with meals and at bedtime for 14 days. 12/11/19 12/25/19  Lennice Sites, DO    Allergies    Patient has no known allergies.  Review of Systems   Review of Systems  Constitutional: Positive for activity change.  Respiratory: Positive for cough and shortness of breath.   Cardiovascular: Positive for chest pain.  Gastrointestinal: Negative for nausea and vomiting.  Hematological: Does not bruise/bleed easily.  All other systems reviewed and are negative.   Physical Exam Updated Vital Signs BP (!) 143/93   Pulse 81   Temp 98.4 F (36.9 C) (Oral)   Resp 20   Ht 5\' 9"  (1.753 m)   Wt 95.3 kg   SpO2 98%   BMI 31.01 kg/m   Physical Exam Vitals and nursing note reviewed.  Constitutional:      Appearance: He is well-developed.  HENT:     Head: Atraumatic.  Cardiovascular:      Rate and Rhythm: Normal rate.  Pulmonary:     Effort: Pulmonary effort is normal.     Breath sounds: Wheezing present. No rhonchi or rales.  Musculoskeletal:     Cervical back: Neck supple.     Right lower leg: No edema.     Left lower leg: No edema.  Skin:    General: Skin is warm.  Neurological:     Mental Status: He is alert and oriented to person, place, and time.     ED Results / Procedures / Treatments   Labs (all labs ordered are listed, but only abnormal results are displayed) Labs Reviewed  COMPREHENSIVE METABOLIC PANEL - Abnormal; Notable for the following components:      Result Value   Sodium 134 (*)    BUN 5 (*)    Calcium 8.3 (*)    Total Protein 8.4 (*)    Albumin 3.0 (*)    AST 119 (*)    Alkaline Phosphatase 186 (*)    Total Bilirubin 1.4 (*)    All other components within normal limits  LACTIC ACID, PLASMA  CBC WITH DIFFERENTIAL/PLATELET  PROTIME-INR  APTT    EKG EKG Interpretation  Date/Time:  Thursday April 13 2020 11:48:19 EST Ventricular Rate:  81 PR Interval:  120 QRS Duration: 129 QT Interval:  415 QTC Calculation: 482 R Axis:   62 Text Interpretation: Sinus rhythm Borderline short PR interval Right bundle branch block rbbb is new Confirmed by Varney Biles [phone]) on 04/13/2020 12:28:15 PM   Radiology DG Chest 2 View  Result Date: 04/13/2020 CLINICAL DATA:  Right pleuritic pain EXAM: CHEST - 2 VIEW COMPARISON:  03/30/2018 FINDINGS: The heart size and mediastinal contours are within normal limits. Chronic interstitial changes. No new consolidation edema. No pleural effusion or pneumothorax. Age-indeterminate loss of height at the inferior endplate of a lower thoracic vertebral body. IMPRESSION: No acute process in the chest. Age-indeterminate loss of height at the inferior endplate of a lower thoracic vertebral body. Electronically Signed   By: Macy Mis M.D.   On: 04/13/2020 11:52    Procedures Procedures   Medications  Ordered in ED Medications  levofloxacin (LEVAQUIN) tablet 500 mg (has no administration in time range)  HYDROcodone-acetaminophen (NORCO/VICODIN) 5-325 MG per tablet 1 tablet (has no administration in time range)    ED Course  I have reviewed the triage vital signs and the nursing notes.  Pertinent labs & imaging results that were available during my care of the  patient were reviewed by me and considered in my medical decision making (see chart for details).    MDM Rules/Calculators/A&P                          50 year old male with history of sarcoidosis comes in with chief complaint of pleuritic chest pain, productive cough with green phlegm and blood-tinged.  There is no history of PE.  Patient has no fevers, chills.  He is vaccinated against COVID-19.  Differential diagnosis includes primarily pleuritic diseases such as bronchitis, pneumonia, PE.  Given that he has sarcoidosis, early pneumonia is quite likely in the setting of him having yellow phlegm.  Clinically patient is not septic. PE considered, especially given that he has new right bundle branch block today compared to 2020 EKG.  However, no signs of DVT present and there are no heart risk factors with PE.  We will get chest x-ray.  Basic labs.  If they are completely reassuring, we will ambulate the patient and decide if we need to get a CT PE versus outpatient management for pneumonia with strict ER return precautions.  Final Clinical Impression(s) / ED Diagnoses Final diagnoses:  Community acquired pneumonia, unspecified laterality    Rx / DC Orders ED Discharge Orders         Ordered    levofloxacin (LEVAQUIN) 500 MG tablet  Daily        04/13/20 1317    acetaminophen (TYLENOL 8 HOUR) 650 MG CR tablet  Every 8 hours        04/13/20 1317            Varney Biles, MD 04/13/20 1319

## 2020-04-13 NOTE — ED Notes (Signed)
RT asked to ambulate patient. When I arrived patient was already up walking around room. Walked a bit further, no SOB noted and SAT 97%. MD aware

## 2020-05-19 ENCOUNTER — Other Ambulatory Visit: Payer: Self-pay | Admitting: Urology

## 2020-05-19 ENCOUNTER — Encounter (HOSPITAL_BASED_OUTPATIENT_CLINIC_OR_DEPARTMENT_OTHER): Payer: Self-pay | Admitting: Urology

## 2020-05-20 ENCOUNTER — Inpatient Hospital Stay (HOSPITAL_COMMUNITY): Admission: RE | Admit: 2020-05-20 | Payer: BC Managed Care – PPO | Source: Ambulatory Visit

## 2020-05-22 ENCOUNTER — Other Ambulatory Visit (HOSPITAL_COMMUNITY)
Admission: RE | Admit: 2020-05-22 | Discharge: 2020-05-22 | Disposition: A | Discharge status: Home / Self Care | Payer: BC Managed Care – PPO | Source: Ambulatory Visit | Attending: Urology | Admitting: Urology

## 2020-05-22 ENCOUNTER — Encounter (HOSPITAL_BASED_OUTPATIENT_CLINIC_OR_DEPARTMENT_OTHER): Payer: Self-pay | Admitting: Urology

## 2020-05-22 ENCOUNTER — Other Ambulatory Visit: Payer: Self-pay

## 2020-05-22 DIAGNOSIS — N503 Cyst of epididymis: Secondary | ICD-10-CM | POA: Diagnosis present

## 2020-05-22 DIAGNOSIS — Z01812 Encounter for preprocedural laboratory examination: Secondary | ICD-10-CM | POA: Insufficient documentation

## 2020-05-22 DIAGNOSIS — Z87891 Personal history of nicotine dependence: Secondary | ICD-10-CM | POA: Diagnosis not present

## 2020-05-22 DIAGNOSIS — Z20822 Contact with and (suspected) exposure to covid-19: Secondary | ICD-10-CM | POA: Insufficient documentation

## 2020-05-22 DIAGNOSIS — R3129 Other microscopic hematuria: Secondary | ICD-10-CM | POA: Diagnosis not present

## 2020-05-22 LAB — SARS CORONAVIRUS 2 (TAT 6-24 HRS): SARS Coronavirus 2: NEGATIVE

## 2020-05-22 NOTE — Progress Notes (Addendum)
ADDENDUM:  Chart reviewed by anesthesia, Dr Doroteo Glassman MDA, ok to proceed.  Spoke w/ via phone for pre-op interview--- PT Lab needs dos---- Istat              Lab results------ current ekg in epic/ chart COVID test ------ done 05-22-2020 result in epic Arrive at ------- 0700 on 05-24-2020 NPO after MN NO Solid Food.  Clear liquids from MN until--- 0600 Med rec completed Medications to take morning of surgery ----- NONE Diabetic medication ----- n/a Patient instructed to bring photo id and insurance card day of surgery Patient aware to have Driver (ride ) / caregiver    for 24 hours after surgery -- friend, Langley Gauss Cox Patient Special Instructions ----- asked to bring rescue inhaler dos Pre-Op special Istructions ----- n/a Patient verbalized understanding of instructions that were given at this phone interview. Patient denies shortness of breath, chest pain, fever, cough at this phone interview.   Anesthesia Review:  Sarcoidosis of lung and skin; mild OSA no cpap;   HTN (no medication currently );   Pt had ED visit 04-23-2020 dx CAP completed antibiotic levaquin.  Pt stated no sob with any activity but have morning productive cough with yellow sputum.    PCP:  Per pt just had visit with a new pcp at [facility]  Pulmonology:  Previous seen by Elijio Miles ([facility]) lov 05-20-2016 [facility] Chest x-ray :  04-13-2020 epic EKG :  04-13-2020 epic Echo :  no Stress test:  no Sleep Study/ CPAP :  YES , 10 yrs ago per pt /NO was using cpap until it got lost

## 2020-05-23 NOTE — Anesthesia Preprocedure Evaluation (Addendum)
Anesthesia Evaluation  Patient identified by MRN, date of birth, ID band Patient awake    Reviewed: Allergy & Precautions, NPO status , Patient's Chart, lab work & pertinent test results  History of Anesthesia Complications Negative for: history of anesthetic complications  Airway Mallampati: II  TM Distance: >3 FB Neck ROM: Full    Dental  (+) Dental Advisory Given, Missing, Poor Dentition   Pulmonary sleep apnea (no longer requires CPAP) , COPD,  COPD inhaler, former smoker,  05/22/2020 SARS coronavirus NEG Sarcoidosis   breath sounds clear to auscultation       Cardiovascular hypertension (does not yet take medication),  Rhythm:Regular Rate:Normal     Neuro/Psych Anxiety Depression negative neurological ROS     GI/Hepatic Neg liver ROS, GERD  Controlled,  Endo/Other  negative endocrine ROSobese  Renal/GU negative Renal ROS     Musculoskeletal   Abdominal (+) + obese,   Peds  Hematology negative hematology ROS (+)   Anesthesia Other Findings   Reproductive/Obstetrics                            Anesthesia Physical Anesthesia Plan  ASA: II  Anesthesia Plan: General   Post-op Pain Management:    Induction: Intravenous  PONV Risk Score and Plan: 2 and Ondansetron and Dexamethasone  Airway Management Planned: LMA  Additional Equipment: None  Intra-op Plan:   Post-operative Plan:   Informed Consent: I have reviewed the patients History and Physical, chart, labs and discussed the procedure including the risks, benefits and alternatives for the proposed anesthesia with the patient or authorized representative who has indicated his/her understanding and acceptance.     Dental advisory given  Plan Discussed with: CRNA and Surgeon  Anesthesia Plan Comments:        Anesthesia Quick Evaluation

## 2020-05-24 ENCOUNTER — Encounter (HOSPITAL_BASED_OUTPATIENT_CLINIC_OR_DEPARTMENT_OTHER): Payer: Self-pay | Admitting: Urology

## 2020-05-24 ENCOUNTER — Ambulatory Visit (HOSPITAL_BASED_OUTPATIENT_CLINIC_OR_DEPARTMENT_OTHER): Payer: BC Managed Care – PPO | Admitting: Anesthesiology

## 2020-05-24 ENCOUNTER — Encounter (HOSPITAL_BASED_OUTPATIENT_CLINIC_OR_DEPARTMENT_OTHER)
Admission: RE | Disposition: A | Discharge status: Home / Self Care | Payer: Self-pay | Source: Home / Self Care | Attending: Urology

## 2020-05-24 ENCOUNTER — Other Ambulatory Visit: Payer: Self-pay

## 2020-05-24 ENCOUNTER — Ambulatory Visit (HOSPITAL_BASED_OUTPATIENT_CLINIC_OR_DEPARTMENT_OTHER)
Admission: RE | Admit: 2020-05-24 | Discharge: 2020-05-24 | Disposition: A | Discharge status: Home / Self Care | Payer: BC Managed Care – PPO | Attending: Urology | Admitting: Urology

## 2020-05-24 DIAGNOSIS — N503 Cyst of epididymis: Secondary | ICD-10-CM | POA: Diagnosis not present

## 2020-05-24 DIAGNOSIS — R3129 Other microscopic hematuria: Secondary | ICD-10-CM | POA: Insufficient documentation

## 2020-05-24 DIAGNOSIS — Z20822 Contact with and (suspected) exposure to covid-19: Secondary | ICD-10-CM | POA: Insufficient documentation

## 2020-05-24 DIAGNOSIS — Z87891 Personal history of nicotine dependence: Secondary | ICD-10-CM | POA: Insufficient documentation

## 2020-05-24 HISTORY — DX: Chronic cough: R05.3

## 2020-05-24 HISTORY — DX: Obstructive sleep apnea (adult) (pediatric): G47.33

## 2020-05-24 HISTORY — DX: Cyst of epididymis: N50.3

## 2020-05-24 HISTORY — DX: Sarcoidosis of lung: D86.0

## 2020-05-24 HISTORY — DX: Sarcoidosis of skin: D86.3

## 2020-05-24 HISTORY — PX: EPIDIDYMECTOMY: SHX6275

## 2020-05-24 HISTORY — DX: Unspecified right bundle-branch block: I45.10

## 2020-05-24 HISTORY — DX: Presence of spectacles and contact lenses: Z97.3

## 2020-05-24 HISTORY — PX: CYSTOSCOPY: SHX5120

## 2020-05-24 HISTORY — DX: Nocturia: R35.1

## 2020-05-24 LAB — POCT I-STAT, CHEM 8
BUN: 3 mg/dL — ABNORMAL LOW (ref 6–20)
Calcium, Ion: 1.16 mmol/L (ref 1.15–1.40)
Chloride: 102 mmol/L (ref 98–111)
Creatinine, Ser: 0.7 mg/dL (ref 0.61–1.24)
Glucose, Bld: 112 mg/dL — ABNORMAL HIGH (ref 70–99)
HCT: 52 % (ref 39.0–52.0)
Hemoglobin: 17.7 g/dL — ABNORMAL HIGH (ref 13.0–17.0)
Potassium: 4.2 mmol/L (ref 3.5–5.1)
Sodium: 142 mmol/L (ref 135–145)
TCO2: 28 mmol/L (ref 22–32)

## 2020-05-24 SURGERY — EPIDIDYMECTOMY
Anesthesia: General | Site: Urethra | Laterality: Left

## 2020-05-24 MED ORDER — ACETAMINOPHEN 500 MG PO TABS
ORAL_TABLET | ORAL | Status: AC
  Filled 2020-05-24: qty 2

## 2020-05-24 MED ORDER — CEFAZOLIN SODIUM-DEXTROSE 2-4 GM/100ML-% IV SOLN
2.0000 g | INTRAVENOUS | Status: AC
  Administered 2020-05-24: 2 g via INTRAVENOUS

## 2020-05-24 MED ORDER — FENTANYL CITRATE (PF) 100 MCG/2ML IJ SOLN: 25.0000 ug | INTRAMUSCULAR | Status: DC | PRN

## 2020-05-24 MED ORDER — DEXAMETHASONE SODIUM PHOSPHATE 10 MG/ML IJ SOLN
INTRAMUSCULAR | Status: AC
  Filled 2020-05-24: qty 1

## 2020-05-24 MED ORDER — ONDANSETRON HCL 4 MG/2ML IJ SOLN
INTRAMUSCULAR | Status: AC
  Filled 2020-05-24: qty 2

## 2020-05-24 MED ORDER — CEFAZOLIN SODIUM-DEXTROSE 2-4 GM/100ML-% IV SOLN
INTRAVENOUS | Status: AC
  Filled 2020-05-24: qty 100

## 2020-05-24 MED ORDER — HYDROCODONE-ACETAMINOPHEN 5-325 MG PO TABS: 1.0000 | ORAL_TABLET | ORAL | 0 refills | Status: DC | PRN

## 2020-05-24 MED ORDER — FENTANYL CITRATE (PF) 100 MCG/2ML IJ SOLN
INTRAMUSCULAR | Status: DC | PRN
  Administered 2020-05-24: 50 ug via INTRAVENOUS
  Administered 2020-05-24: 25 ug via INTRAVENOUS
  Administered 2020-05-24: 50 ug via INTRAVENOUS
  Administered 2020-05-24 (×3): 25 ug via INTRAVENOUS

## 2020-05-24 MED ORDER — MEPERIDINE HCL 25 MG/ML IJ SOLN: 6.2500 mg | INTRAMUSCULAR | Status: DC | PRN

## 2020-05-24 MED ORDER — PROPOFOL 10 MG/ML IV BOLUS
INTRAVENOUS | Status: DC | PRN
  Administered 2020-05-24: 200 mg via INTRAVENOUS

## 2020-05-24 MED ORDER — FENTANYL CITRATE (PF) 100 MCG/2ML IJ SOLN
INTRAMUSCULAR | Status: AC
  Filled 2020-05-24: qty 2

## 2020-05-24 MED ORDER — ONDANSETRON HCL 4 MG/2ML IJ SOLN
INTRAMUSCULAR | Status: DC | PRN
  Administered 2020-05-24: 4 mg via INTRAVENOUS

## 2020-05-24 MED ORDER — HYDROMORPHONE HCL 2 MG/ML IJ SOLN
INTRAMUSCULAR | Status: AC
  Filled 2020-05-24: qty 1

## 2020-05-24 MED ORDER — MIDAZOLAM HCL 5 MG/5ML IJ SOLN
INTRAMUSCULAR | Status: DC | PRN
  Administered 2020-05-24: 2 mg via INTRAVENOUS

## 2020-05-24 MED ORDER — PROPOFOL 10 MG/ML IV BOLUS
INTRAVENOUS | Status: AC
  Filled 2020-05-24: qty 40

## 2020-05-24 MED ORDER — OXYCODONE HCL 5 MG PO TABS: 5.0000 mg | ORAL_TABLET | Freq: Once | ORAL | Status: DC | PRN

## 2020-05-24 MED ORDER — DEXAMETHASONE SODIUM PHOSPHATE 10 MG/ML IJ SOLN
INTRAMUSCULAR | Status: DC | PRN
  Administered 2020-05-24: 10 mg via INTRAVENOUS

## 2020-05-24 MED ORDER — LIDOCAINE 2% (20 MG/ML) 5 ML SYRINGE
INTRAMUSCULAR | Status: AC
  Filled 2020-05-24: qty 5

## 2020-05-24 MED ORDER — ARTIFICIAL TEARS OPHTHALMIC OINT
TOPICAL_OINTMENT | OPHTHALMIC | Status: AC
  Filled 2020-05-24: qty 3.5

## 2020-05-24 MED ORDER — HYDROMORPHONE HCL 1 MG/ML IJ SOLN
INTRAMUSCULAR | Status: DC | PRN
  Administered 2020-05-24: 1 mg via INTRAVENOUS

## 2020-05-24 MED ORDER — OXYCODONE HCL 5 MG/5ML PO SOLN: 5.0000 mg | Freq: Once | ORAL | Status: DC | PRN

## 2020-05-24 MED ORDER — MIDAZOLAM HCL 2 MG/2ML IJ SOLN
INTRAMUSCULAR | Status: AC
  Filled 2020-05-24: qty 2

## 2020-05-24 MED ORDER — ACETAMINOPHEN 500 MG PO TABS
1000.0000 mg | ORAL_TABLET | Freq: Once | ORAL | Status: AC
  Administered 2020-05-24: 1000 mg via ORAL

## 2020-05-24 MED ORDER — BUPIVACAINE HCL (PF) 0.25 % IJ SOLN
INTRAMUSCULAR | Status: DC | PRN
  Administered 2020-05-24: 10 mL

## 2020-05-24 MED ORDER — PROMETHAZINE HCL 25 MG/ML IJ SOLN: 6.2500 mg | INTRAMUSCULAR | Status: DC | PRN

## 2020-05-24 MED ORDER — LACTATED RINGERS IV SOLN: INTRAVENOUS | Status: DC

## 2020-05-24 MED ORDER — MIDAZOLAM HCL 2 MG/2ML IJ SOLN: 0.5000 mg | Freq: Once | INTRAMUSCULAR | Status: DC | PRN

## 2020-05-24 MED ORDER — LIDOCAINE 2% (20 MG/ML) 5 ML SYRINGE
INTRAMUSCULAR | Status: DC | PRN
  Administered 2020-05-24: 30 mg via INTRAVENOUS

## 2020-05-24 SURGICAL SUPPLY — 45 items
ADH SKN CLS APL DERMABOND .7 (GAUZE/BANDAGES/DRESSINGS) ×2
BAG DRAIN URO-CYSTO SKYTR STRL (DRAIN) ×2 IMPLANT
BAG DRN UROCATH (DRAIN) ×2
BLADE CLIPPER SENSICLIP SURGIC (BLADE) ×2 IMPLANT
BLADE SURG 15 STRL LF DISP TIS (BLADE) ×2 IMPLANT
BLADE SURG 15 STRL SS (BLADE) ×4
BNDG GAUZE ELAST 4 BULKY (GAUZE/BANDAGES/DRESSINGS) ×4 IMPLANT
CLEANER CAUTERY TIP 5X5 PAD (MISCELLANEOUS) ×2 IMPLANT
COVER BACK TABLE 60X90IN (DRAPES) ×4 IMPLANT
COVER MAYO STAND STRL (DRAPES) ×4 IMPLANT
COVER WAND RF STERILE (DRAPES) ×4 IMPLANT
DERMABOND ADVANCED (GAUZE/BANDAGES/DRESSINGS) ×2
DERMABOND ADVANCED .7 DNX12 (GAUZE/BANDAGES/DRESSINGS) IMPLANT
DRAIN PENROSE 0.25X18 (DRAIN) ×2 IMPLANT
DRAPE LAPAROTOMY 100X72 PEDS (DRAPES) ×4 IMPLANT
ELECT REM PT RETURN 9FT ADLT (ELECTROSURGICAL) ×4
ELECTRODE REM PT RTRN 9FT ADLT (ELECTROSURGICAL) ×2 IMPLANT
GLOVE SURG ENC MOIS LTX SZ6.5 (GLOVE) ×2 IMPLANT
GLOVE SURG ENC MOIS LTX SZ7.5 (GLOVE) ×4 IMPLANT
GLOVE SURG UNDER POLY LF SZ6.5 (GLOVE) ×2 IMPLANT
GLOVE SURG UNDER POLY LF SZ7 (GLOVE) ×2 IMPLANT
GLOVE SURG UNDER POLY LF SZ7.5 (GLOVE) ×2 IMPLANT
GOWN STRL REUS W/TWL LRG LVL3 (GOWN DISPOSABLE) ×10 IMPLANT
HEMOSTAT ARISTA ABSORB 3G PWDR (HEMOSTASIS) ×2 IMPLANT
KIT TURNOVER CYSTO (KITS) ×4 IMPLANT
NDL HYPO 25X1 1.5 SAFETY (NEEDLE) ×2 IMPLANT
NEEDLE HYPO 25X1 1.5 SAFETY (NEEDLE) ×4 IMPLANT
NS IRRIG 500ML POUR BTL (IV SOLUTION) ×2 IMPLANT
PACK BASIN DAY SURGERY FS (CUSTOM PROCEDURE TRAY) ×4 IMPLANT
PAD CLEANER CAUTERY TIP 5X5 (MISCELLANEOUS) ×2
PENCIL SMOKE EVACUATOR (MISCELLANEOUS) ×4 IMPLANT
SOL PREP POV-IOD 4OZ 10% (MISCELLANEOUS) ×2 IMPLANT
SUPPORT SCROTAL LG STRP (MISCELLANEOUS) ×3 IMPLANT
SUPPORTER ATHLETIC LG (MISCELLANEOUS) ×1
SUT CHROMIC 3 0 PS 2 (SUTURE) ×2 IMPLANT
SUT MNCRL AB 4-0 PS2 18 (SUTURE) ×4 IMPLANT
SUT SILK 2 0 SH (SUTURE) ×2 IMPLANT
SUT VIC AB 3-0 SH 27 (SUTURE) ×12
SUT VIC AB 3-0 SH 27X BRD (SUTURE) ×2 IMPLANT
SYR CONTROL 10ML LL (SYRINGE) ×4 IMPLANT
TOWEL OR 17X26 10 PK STRL BLUE (TOWEL DISPOSABLE) ×6 IMPLANT
TRAY DSU PREP LF (CUSTOM PROCEDURE TRAY) ×4 IMPLANT
TUBE CONNECTING 12'X1/4 (SUCTIONS) ×1
TUBE CONNECTING 12X1/4 (SUCTIONS) ×3 IMPLANT
YANKAUER SUCT BULB TIP NO VENT (SUCTIONS) ×4 IMPLANT

## 2020-05-24 NOTE — Anesthesia Postprocedure Evaluation (Signed)
Anesthesia Post Note  Patient: Trevor Reilly  Procedure(s) Performed: LEFT EPIDIDYMAL CYST REMOVAL (Left Scrotum) CYSTOSCOPY FLEXIBLE (Urethra)     Patient location during evaluation: Phase II Anesthesia Type: General Level of consciousness: awake and alert, patient cooperative and oriented Pain management: pain level controlled Vital Signs Assessment: post-procedure vital signs reviewed and stable Respiratory status: spontaneous breathing, nonlabored ventilation and respiratory function stable Cardiovascular status: blood pressure returned to baseline and stable Postop Assessment: no apparent nausea or vomiting, able to ambulate and adequate PO intake Anesthetic complications: no Comments: Pt agrees to f/u with primary care for hypertension eval   No complications documented.  Last Vitals:  Vitals:   05/24/20 1130 05/24/20 1200  BP: (!) 128/96 (!) 144/106  Pulse: 84 88  Resp: 12 14  Temp:  36.8 C  SpO2: 93% 98%    Last Pain:  Vitals:   05/24/20 1200  TempSrc:   PainSc: 0-No pain                 Leonidus Rowand,E. Keyon Winnick

## 2020-05-24 NOTE — H&P (Signed)
CC/HPI: CC: Left Scrotal mass  HPI:  50 year old male with a several year history of a left-sided testicular mass. He has had several shis revealedcrotal ultrasounds. The last scrotal ultrasound was 12/24/2019. this revealed a large fluid collection with internal septations concerning for large hydrocele versus large spermatocele or epididymal cyst. this hindered his daily activity. Sometimes painful. Surgery was recommended in the past but insurance issues was a problem at that time.     ALLERGIES: None   MEDICATIONS: None   GU PSH: None   NON-GU PSH: None   GU PMH: None   NON-GU PMH: GERD Pneumonia, unspecified organism    FAMILY HISTORY: 1 son - Other Myocardial Infarction - Father   SOCIAL HISTORY: Marital Status: Single Preferred Language: English; Race: Black or African American Current Smoking Status: Patient does not smoke anymore. Has not smoked since 03/29/2019.   Tobacco Use Assessment Completed: Used Tobacco in last 30 days? Does not drink caffeine.    REVIEW OF SYSTEMS:    GU Review Male:   Patient reports frequent urination. Patient denies hard to postpone urination, burning/ pain with urination, get up at night to urinate, leakage of urine, stream starts and stops, trouble starting your stream, have to strain to urinate , erection problems, and penile pain.  Gastrointestinal (Upper):   Patient reports indigestion/ heartburn. Patient denies nausea and vomiting.  Gastrointestinal (Lower):   Patient reports diarrhea. Patient denies constipation.  Constitutional:   Patient denies fever, night sweats, weight loss, and fatigue.  Skin:   Patient reports skin rash/ lesion and itching.   Eyes:   Patient denies blurred vision and double vision.  Ears/ Nose/ Throat:   Patient denies sore throat and sinus problems.  Hematologic/Lymphatic:   Patient denies swollen glands and easy bruising.  Cardiovascular:   Patient denies leg swelling and chest pains.  Respiratory:   Patient  denies cough and shortness of breath.  Endocrine:   Patient denies excessive thirst.  Musculoskeletal:   Patient reports back pain and joint pain.   Neurological:   Patient denies headaches and dizziness.  Psychologic:   Patient denies depression and anxiety.   BP (!) 138/105   Pulse 88   Temp 98.9 F (37.2 C) (Oral)   Resp 16   Ht 5\' 9"  (1.753 m)   Wt 96.4 kg   SpO2 99%   BMI 31.38 kg/m    GU PHYSICAL EXAMINATION:    Testes: Right testicle palpably normal. Left hemiscrotal swelling consistent with hydrocele versus epididymal cyst versus spermatocele  Penis: Circumcised, no warts, no cracks. No dorsal Peyronie's plaques, no left corporal Peyronie's plaques, no right corporal Peyronie's plaques, no scarring, no warts. No balanitis, no meatal stenosis.   MULTI-SYSTEM PHYSICAL EXAMINATION:    Constitutional: Well-nourished. No physical deformities. Normally developed. Good grooming.  Respiratory: No labored breathing, no use of accessory muscles.   Cardiovascular: Normal temperature, normal extremity pulses, no swelling, no varicosities.  Skin: No paleness, no jaundice, no cyanosis. No lesion, no ulcer, no rash.  Neurologic / Psychiatric: Oriented to time, oriented to place, oriented to person. No depression, no anxiety, no agitation.  Gastrointestinal: No mass, no tenderness, no rigidity, non obese abdomen.  Eyes: Normal conjunctivae. Normal eyelids.  Musculoskeletal: Normal gait and station of head and neck.     Complexity of Data:  Source Of History:  Patient  Records Review:   Previous Doctor Records, Previous Patient Records  Urine Test Review:   Urinalysis  X-Ray Review: Scrotal Ultrasound: Reviewed Films.  Reviewed Report. Discussed With Patient.     PROCEDURES:          Urinalysis w/Scope Dipstick Dipstick Cont'd Micro  Color: Amber Bilirubin: Neg mg/dL WBC/hpf: 0 - 5/hpf  Appearance: Slightly Cloudy Ketones: Neg mg/dL RBC/hpf: 40 - 60/hpf  Specific Gravity: 1.025  Blood: 3+ ery/uL Bacteria: Few (10-25/hpf)  pH: 6.0 Protein: 1+ mg/dL Cystals: NS (Not Seen)  Glucose: Neg mg/dL Urobilinogen: 1.0 mg/dL Casts: Granular    Nitrites: Neg Trichomonas: Not Present    Leukocyte Esterase: Neg leu/uL Mucous: Present      Epithelial Cells: 0 - 5/hpf      Yeast: NS (Not Seen)      Sperm: Not Present    ASSESSMENT:      ICD-10 Details  1 GU:   Epididymal cyst - N50.3 Undiagnosed New Problem  2   Microscopic hematuria - R31.21 Undiagnosed New Problem   PLAN:           Orders Labs Urine Culture  X-Rays: C.T. Hematuria With and Without I.V. Contrast          Schedule Return Visit/Planned Activity: Next Available Appointment - Schedule Surgery  Return Visit/Planned Activity: Next Available Appointment - C.T. Hematuria          Document Letter(s):  Created for Patient: Clinical Summary         Notes:   obtain CT hematuria protocol given his microscopic hematuria. I will perform a cystoscopy in the operating room with possible intervention.   Plan for left epididymal cyst excision versus hydrocelectomy. Risk of bleeding, hematoma formation, infection, injury to surrounding structures including testicular injury or testicular loss, need for additional procedures, wound dehiscence were discussed. Risk of recurrence were also discussed. He is eager to proceed.   cc: Mackie Pai

## 2020-05-24 NOTE — Transfer of Care (Signed)
Immediate Anesthesia Transfer of Care Note  Patient: Trevor Reilly  Procedure(s) Performed: LEFT EPIDIDYMAL CYST REMOVAL (Left Scrotum) CYSTOSCOPY FLEXIBLE (Urethra)  Patient Location: PACU  Anesthesia Type:General  Level of Consciousness: awake, alert , oriented and patient cooperative  Airway & Oxygen Therapy: Patient Spontanous Breathing and Patient connected to nasal cannula oxygen  Post-op Assessment: Report given to RN and Post -op Vital signs reviewed and stable  Post vital signs: Reviewed and stable  Last Vitals:  Vitals Value Taken Time  BP 125/97 05/24/20 1046  Temp    Pulse 97 05/24/20 1048  Resp 14 05/24/20 1048  SpO2 90 % 05/24/20 1048  Vitals shown include unvalidated device data.  Last Pain:  Vitals:   05/24/20 0703  TempSrc: Oral         Complications: No complications documented.

## 2020-05-24 NOTE — Anesthesia Procedure Notes (Signed)
Procedure Name: LMA Insertion Date/Time: 05/24/2020 9:30 AM Performed by: Genelle Bal, CRNA Pre-anesthesia Checklist: Patient identified, Emergency Drugs available, Suction available and Patient being monitored Patient Re-evaluated:Patient Re-evaluated prior to induction Oxygen Delivery Method: Circle system utilized Preoxygenation: Pre-oxygenation with 100% oxygen Induction Type: IV induction Ventilation: Mask ventilation without difficulty LMA: LMA inserted LMA Size: 5.0 Number of attempts: 1 Airway Equipment and Method: Bite block Placement Confirmation: positive ETCO2 Tube secured with: Tape Dental Injury: Teeth and Oropharynx as per pre-operative assessment

## 2020-05-24 NOTE — Interval H&P Note (Signed)
History and Physical Interval Note:  05/24/2020 9:19 AM  Trevor Reilly  has presented today for surgery, with the diagnosis of LEFT EPIDIDYMAL CYSTO AND HEMATURIA.  The various methods of treatment have been discussed with the patient and family. After consideration of risks, benefits and other options for treatment, the patient has consented to  Procedure(s): LEFT EPIDIDYMAL CYSTO REMOVAL CYSTOSCOPY WIHT POSSIBLE INTERVENTION (Left) as a surgical intervention.  The patient's history has been reviewed, patient examined, no change in status, stable for surgery.  I have reviewed the patient's chart and labs.  Questions were answered to the patient's satisfaction.     Marton Redwood, III

## 2020-05-24 NOTE — Op Note (Signed)
Operative Note  Preoperative diagnosis:  1.  Left epididymal cyst, microscopic hematuria  Postoperative diagnosis: 1.  Left epididymal cyst, microscopic hematuria  Procedure(s): 1.  Left epididymal cyst removal 2.  Diagnostic cystoscopy  Surgeon: Link Snuffer, MD  Assistants: None  Anesthesia: General  Complications: None immediate  EBL: 50 cc  Specimens: 1.  None  Drains/Catheters: 1.  Penrose drain  Intraoperative findings: 1.  Significantly inflamed left epididymal cyst that was quite large.  Testicle appeared viable after excision but there was a significant amount of dissection that was required.  I was able to preserve the vas deferens and the cord.  There was one artery that was avulsed during the dissection that was suture-ligated.  However, at the conclusion of the case the testicle appeared viable.  On cystoscopy, he had no significant prostatic obstruction.  Urethra was normal.  Normal bladder mucosa.  Indication: 50 year old male with a large left epididymal cyst and microscopic hematuria presents for the previously mentioned operation.  Description of procedure:  The patient was identified and consent was obtained.  The patient was taken to the operating room and placed in the supine position.  The patient was placed under general anesthesia.  Perioperative antibiotics were administered.   Patient was prepped and draped in a standard sterile fashion and a timeout was performed.  A 5 cm scrotal incision was made along the median raphae and carried down sharply with scalpel through the dartos.  Electrocautery was used to carry this down and subsequently deliver the left-sided epididymal cyst onto the operative field.  There was significant inflammatory tissue overlying the left-sided epididymal cyst that was dissected both bluntly and with electrocautery.  During dissection, there was 1 arterial bleed that was suture-ligated.  I was able to identify the vas deferens and  preserve this with the testicle.  I dissected the remainder of the epididymal cyst.  I was then able to excise it with Bovie electrocautery.  I discarded this.  I inspected the testicle and it appeared viable.  The spermatic cord was intact.  The vas deferens was intact but separate with minimal peri-vasal tissue.  Hemostasis was obtained with Bovie electrocautery.  Given the significant dissection that was involved as well as the risk of hematoma, I decided to instill Arista into the scrotum.  Also left a Penrose drain and secured that down externally with a silk suture.  I closed the dartos with 3-0 Vicryl suture in a running fashion followed by closure of the skin with a running 3-0 chromic.  I instilled quarter percent Marcaine for anesthetic effect followed by application of Dermabond.  I then performed flexible cystoscopy.  The findings are noted above.  There were no abnormal findings.  I withdrew the scope and this concluded the operation.  Patient tolerated the procedure well and was stable postoperative.  Plan: Follow-up on Monday for postoperative check and drain removal

## 2020-05-24 NOTE — Discharge Instructions (Addendum)
Discharge instructions following scrotal surgery  There is a drain in your scrotum.  We will keep that until at least Monday.  You will notice some discharge from around the drain.  It is normal to have a small amount of discharge but if it becomes continuous and significant, please call our office or go to the emergency room.  Call your doctor for:  Fever is greater than 100.5  Severe nausea or vomiting  Increasing pain not controlled by pain medication  Increasing redness or drainage from incisions  The number for questions or concerns is [phone]     Activity level: No lifting greater than 20 pounds (about equal to milk) for the next 2 weeks or until cleared to do so at follow-up appointment.  Otherwise activity as tolerated by comfort level.  Diet: May resume your regular diet as tolerated  Driving: No driving while still taking opiate pain medications (weight at least 6-8 hours after last dose).  No driving if you still sore from surgery as it may limit her ability to react quickly if necessary.   Shower/bath: May shower and get incision wet pad dry immediately following.  Do not scrub vigorously for the next 2-3 weeks.  Do not soak incision (ID soaking in bath or swimming) until told he may do so by Dr., as this may promote a wound infection.  Wound care: He may cover wounds with sterile gauze as needed to prevent incisions rubbing on close follow-up in any seepage.  Where tight fitting underpants/scrotal support for at least 2 weeks.  He should apply cold compresses (ice or sac of frozen peas/corn) to your scrotum for at least 48 hours to reduce the swelling for 15 minutes at a time indirectly.  You should expect that his scrotum will swell up initially and then get smaller over the next 2-4 weeks.  Follow-up appointments: Follow-up appointment will be scheduled with Dr. Gloriann Loan for a wound check.  Post Anesthesia Home Care Instructions  Activity: Get plenty of rest for  the remainder of the day. A responsible individual must stay with you for 24 hours following the procedure.  For the next 24 hours, DO NOT: -Drive a car -Paediatric nurse -Drink alcoholic beverages -Take any medication unless instructed by your physician -Make any legal decisions or sign important papers.  Meals: Start with liquid foods such as gelatin or soup. Progress to regular foods as tolerated. Avoid greasy, spicy, heavy foods. If nausea and/or vomiting occur, drink only clear liquids until the nausea and/or vomiting subsides. Call your physician if vomiting continues.  Special Instructions/Symptoms: Your throat may feel dry or sore from the anesthesia or the breathing tube placed in your throat during surgery. If this causes discomfort, gargle with warm salt water. The discomfort should disappear within 24 hours.  If you had a scopolamine patch placed behind your ear for the management of post- operative nausea and/or vomiting:  1. The medication in the patch is effective for 72 hours, after which it should be removed.  Wrap patch in a tissue and discard in the trash. Wash hands thoroughly with soap and water. 2. You may remove the patch earlier than 72 hours if you experience unpleasant side effects which may include dry mouth, dizziness or visual disturbances. 3. Avoid touching the patch. Wash your hands with soap and water after contact with the patch.

## 2020-05-25 ENCOUNTER — Encounter (HOSPITAL_BASED_OUTPATIENT_CLINIC_OR_DEPARTMENT_OTHER): Payer: Self-pay | Admitting: Urology

## 2020-08-14 ENCOUNTER — Encounter (HOSPITAL_BASED_OUTPATIENT_CLINIC_OR_DEPARTMENT_OTHER): Payer: Self-pay | Admitting: Emergency Medicine

## 2020-08-14 ENCOUNTER — Other Ambulatory Visit: Payer: Self-pay

## 2020-08-14 ENCOUNTER — Emergency Department (HOSPITAL_BASED_OUTPATIENT_CLINIC_OR_DEPARTMENT_OTHER): Payer: Self-pay

## 2020-08-14 ENCOUNTER — Emergency Department (HOSPITAL_BASED_OUTPATIENT_CLINIC_OR_DEPARTMENT_OTHER)
Admission: EM | Admit: 2020-08-14 | Discharge: 2020-08-14 | Disposition: A | Discharge status: Home / Self Care | Payer: Self-pay | Attending: Emergency Medicine | Admitting: Emergency Medicine

## 2020-08-14 DIAGNOSIS — Z87891 Personal history of nicotine dependence: Secondary | ICD-10-CM | POA: Insufficient documentation

## 2020-08-14 DIAGNOSIS — W1789XA Other fall from one level to another, initial encounter: Secondary | ICD-10-CM | POA: Insufficient documentation

## 2020-08-14 DIAGNOSIS — Y9289 Other specified places as the place of occurrence of the external cause: Secondary | ICD-10-CM | POA: Insufficient documentation

## 2020-08-14 DIAGNOSIS — Y990 Civilian activity done for income or pay: Secondary | ICD-10-CM | POA: Insufficient documentation

## 2020-08-14 DIAGNOSIS — Y9389 Activity, other specified: Secondary | ICD-10-CM | POA: Insufficient documentation

## 2020-08-14 DIAGNOSIS — I10 Essential (primary) hypertension: Secondary | ICD-10-CM | POA: Insufficient documentation

## 2020-08-14 DIAGNOSIS — S2241XA Multiple fractures of ribs, right side, initial encounter for closed fracture: Secondary | ICD-10-CM | POA: Insufficient documentation

## 2020-08-14 IMAGING — DX DG RIBS W/ CHEST 3+V*R*
3 series · 3 of 3 positions shown · non-contrast
Comparison: Chest radiographs [DATE] and earlier.

CLINICAL DATA: 50-year-old male with right rib pain after fall
between a tractor and a loading dock at work. Former smoker.

EXAM:
RIGHT RIBS AND CHEST - 3+ VIEW

[chest pa]
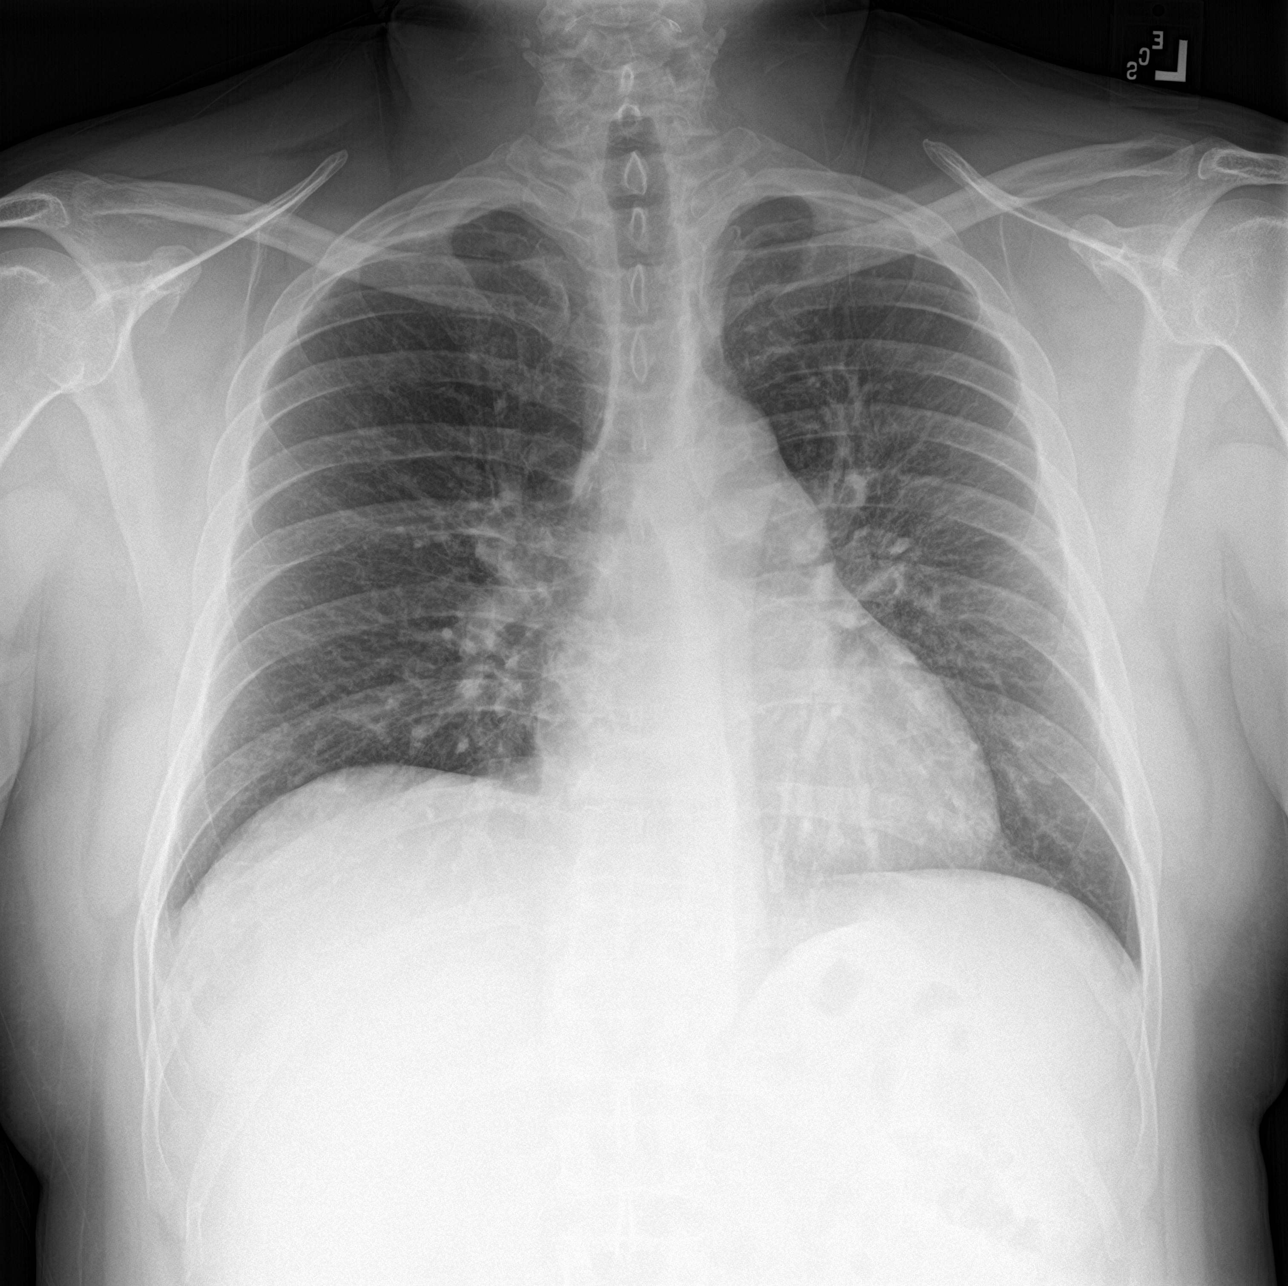

[rib pa]
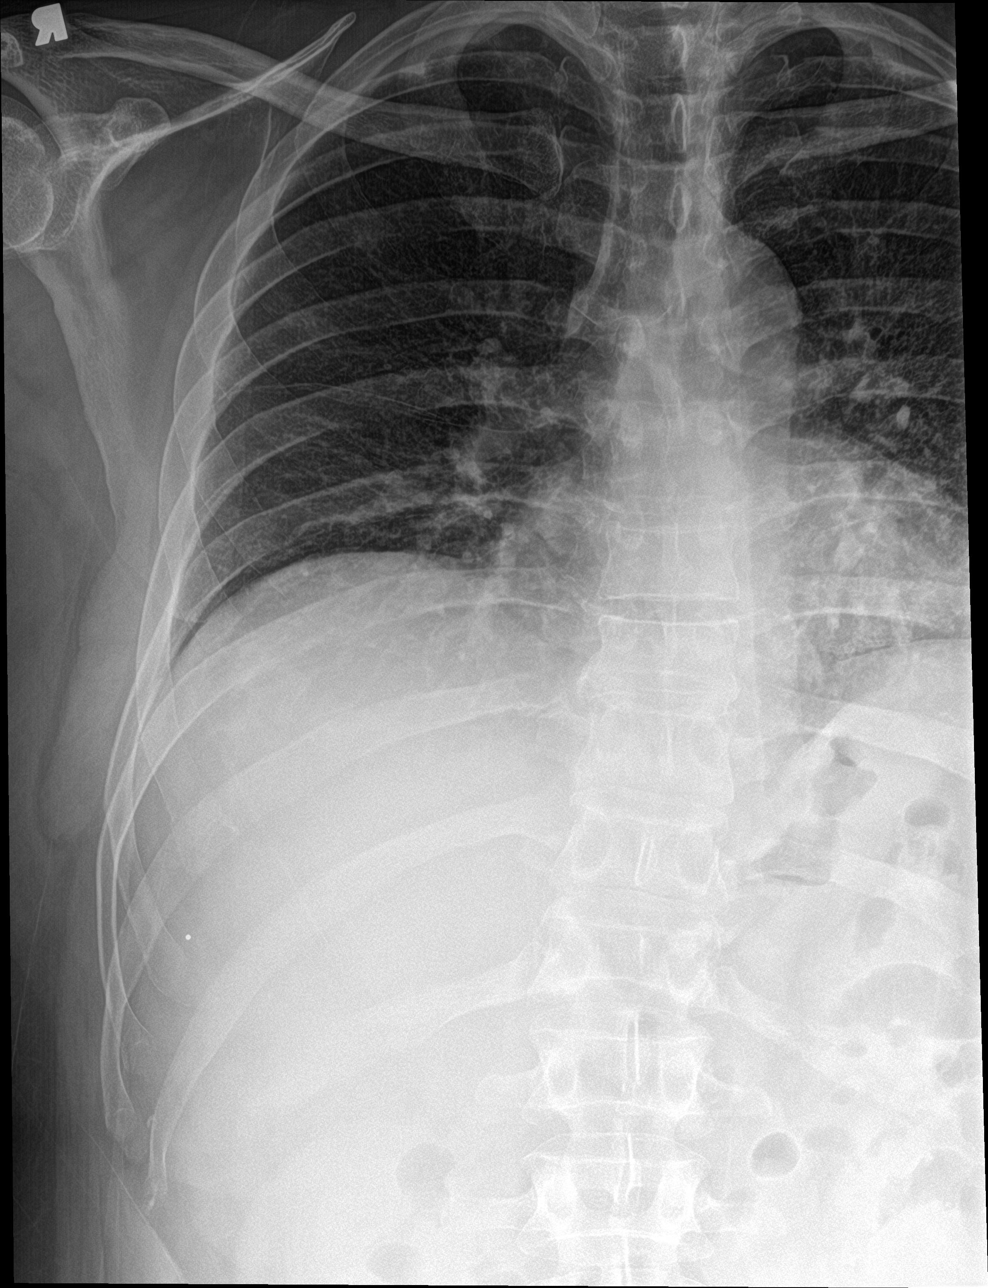

[rib pa obl]
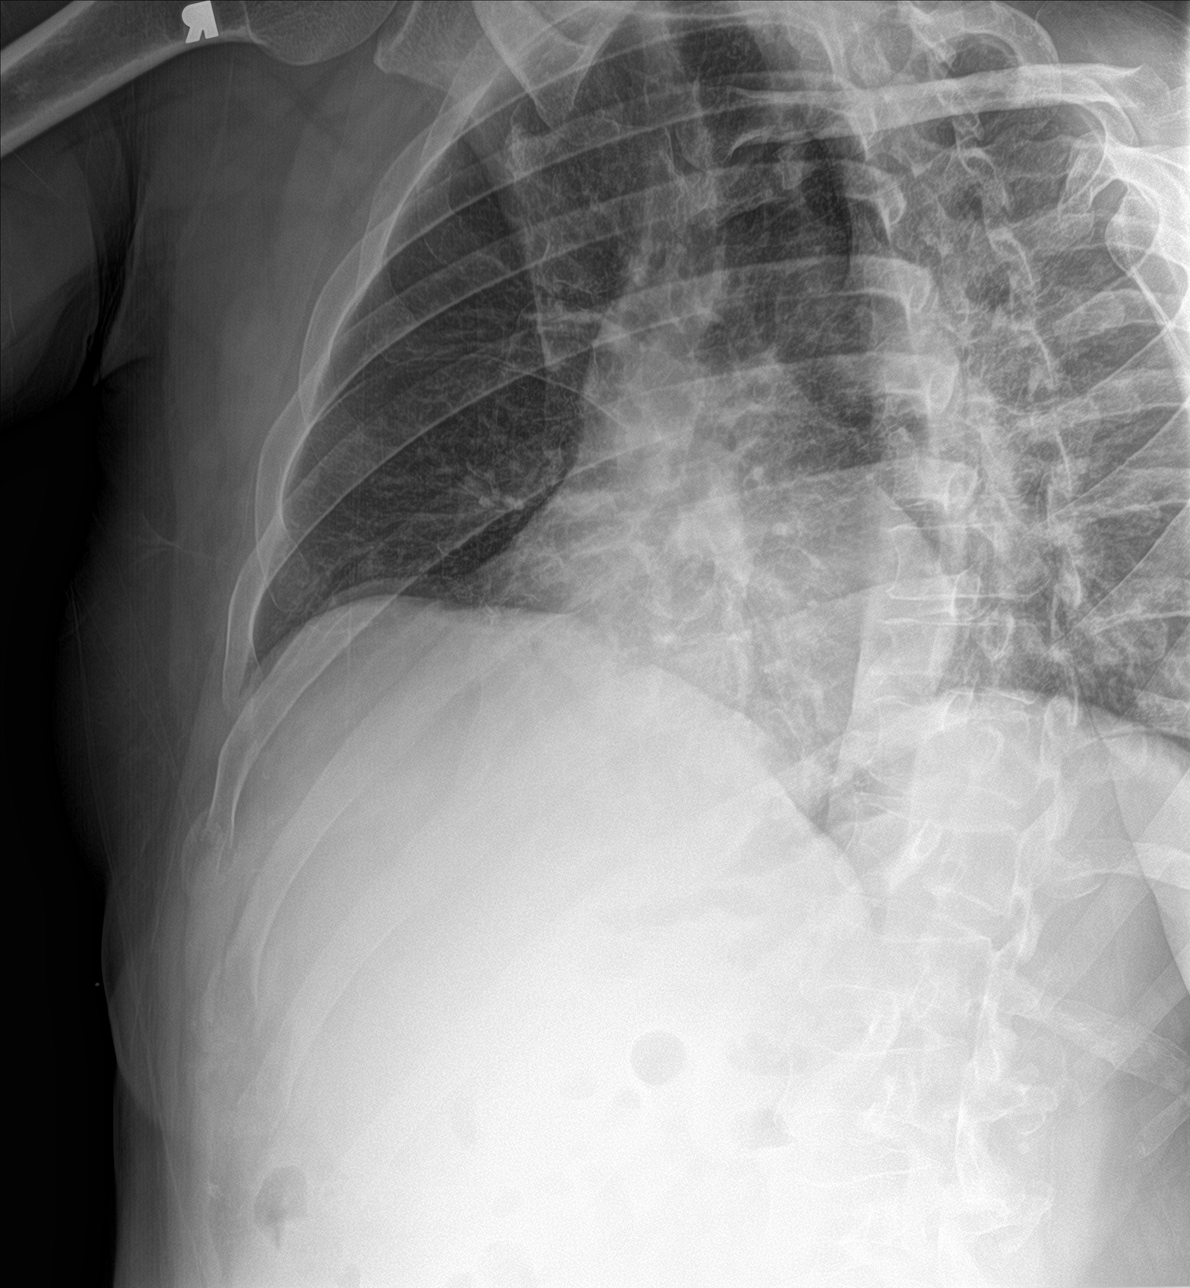

[3 of 3 positions shown; findings below may reference images not displayed]

FINDINGS: Lung volumes and mediastinal contours are stable and within normal
limits. Visualized tracheal air column is within normal limits.
Stable mild increased interstitial markings. No pneumothorax or
pleural effusion. Negative visible bowel gas.

Two oblique views of the right ribs. Minimally displaced fractures
of the anterior right 7th and 8th ribs. Anterior right 8th rib is
the area marked on the skin surface. No other right rib fracture
identified. Mild lower thoracic compression fracture is stable.
Other visible osseous structures appear intact.
IMPRESSION: 1. Minimally displaced anterior right 7th and 8th rib fractures.
2.  No acute cardiopulmonary abnormality.

## 2020-08-14 MED ORDER — HYDROCODONE-ACETAMINOPHEN 5-325 MG PO TABS
2.0000 | ORAL_TABLET | Freq: Once | ORAL | Status: AC
  Administered 2020-08-14: 2 via ORAL
  Filled 2020-08-14: qty 2

## 2020-08-14 MED ORDER — HYDROCODONE-ACETAMINOPHEN 5-325 MG PO TABS
1.0000 | ORAL_TABLET | Freq: Four times a day (QID) | ORAL | 0 refills | Status: DC | PRN
Start: 2020-08-14 — End: 2021-05-07

## 2020-08-14 NOTE — ED Provider Notes (Signed)
[facility] EMERGENCY DEPARTMENT Provider Note   CSN: [id number] Arrival date & time: 08/14/20  2671     History Chief Complaint  Patient presents with   Rib Injury    Trevor Reilly is a 50 y.o. male.  He said he was injured at work 4 days ago when getting out of his truck he fell between the back of the truck and the lift gate.  Struck his right lateral ribs.  Since then he has had pain in his right lower ribs worse with coughing and deep breathing.  Using Aleve without improvement.  Denies other injuries.  No head or neck injury.  Not on blood thinners.  The history is provided by the patient.  Chest Pain Pain location:  R lateral chest Pain quality: stabbing   Pain radiates to:  Does not radiate Pain severity:  Severe Onset quality:  Sudden Duration:  4 days Timing:  Constant Progression:  Unchanged Chronicity:  New Context: trauma   Relieved by:  Nothing Worsened by:  Coughing, deep breathing and movement Associated symptoms: no abdominal pain, no diaphoresis, no fever, no headache, no nausea, no shortness of breath and no vomiting       Past Medical History:  Diagnosis Date   Chronic cough    05-22-2020 per pt morning productive with yellow sputum   Depression    ED (erectile dysfunction)    Epididymal cyst    left   Fatty liver    05-22-2020  per pt has been referred to GI,  last ultrasound in epic 06-10-2016   GERD (gastroesophageal reflux disease)    History of community acquired pneumonia 04/13/2020   ED visit in epic   History of rectal bleeding    Hypertension    05-22-2020 followed by new pcp--- no medication currently   Mild obstructive sleep apnea    05-22-2020  per pt had study approx. 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   Past Surgical History:  Procedure Laterality Date   CYSTOSCOPY  05/24/2020   Procedure: CYSTOSCOPY FLEXIBLE;  Surgeon: Lucas Mallow, MD;  Location: [facility];  Service: Urology;;   EPIDIDYMECTOMY Left 05/24/2020   Procedure: LEFT EPIDIDYMAL CYST REMOVAL;  Surgeon: Lucas Mallow, MD;  Location: [facility];  Service: Urology;  Laterality: Left;   LUNG BIOPSY  2007   IR   CT guided needle lung biopsy's and washing's        Family History  Problem Relation Age of Onset   Gout Mother    Hypertension Mother    Thyroid disease Mother    Stroke Father    Heart attack Father    Thyroid disease Sister    Hypertension Brother    Sarcoidosis Cousin     Social History   Tobacco Use   Smoking status: Former    Packs/day: 0.50    Years: 25.00    Pack years: 12.50    Types: Cigarettes    Quit  date: 04/24/2020    Years since quitting: 0.3   Smokeless tobacco: Never  Vaping Use   Vaping Use: Never used  Substance Use Topics   Drug use: Never    Home Medications Prior to Admission medications   Medication Sig Start Date End Date Taking? Authorizing Provider  acetaminophen (TYLENOL 8 HOUR) 650 MG CR tablet Take 1 tablet (650 mg total) by mouth every 8 (eight) hours. 04/13/20   Varney Biles, MD  albuterol (VENTOLIN HFA) 108 (90 Base) MCG/ACT inhaler Inhale into the lungs as needed. 07/18/15   [provider]  HYDROcodone-acetaminophen (NORCO/VICODIN) 5-325 MG tablet Take 1 tablet by mouth every 4 (four) hours as needed for moderate pain. 05/24/20 05/24/21  Marton Redwood III, MD  pantoprazole (PROTONIX) 40 MG tablet Take 1 tablet (40 mg total) by mouth daily. Patient taking differently: Take 40 mg by mouth every evening. 12/11/19   Lennice Sites, DO     Allergies    Patient has no known allergies.  Review of Systems   Review of Systems  Constitutional:  Negative for diaphoresis and fever.  HENT:  Negative for sore throat.   Eyes:  Negative for visual disturbance.  Respiratory:  Negative for shortness of breath.   Cardiovascular:  Positive for chest pain.  Gastrointestinal:  Negative for abdominal pain, nausea and vomiting.  Genitourinary:  Negative for dysuria.  Musculoskeletal:  Negative for neck pain.  Skin:  Negative for rash.  Neurological:  Negative for headaches.   Physical Exam Updated Vital Signs BP (!) 152/106 (BP Location: Right Arm)   Pulse 88   Temp 98.1 F (36.7 C) (Oral)   Resp 18   Ht 5\' 9"  (1.753 m)   Wt 104.3 kg   SpO2 100%   BMI 33.97 kg/m   Physical Exam Vitals and nursing note reviewed.  Constitutional:      Appearance: He is well-developed.  HENT:     Head: Normocephalic and atraumatic.  Eyes:     Conjunctiva/sclera: Conjunctivae normal.  Cardiovascular:     Rate and Rhythm: Normal rate and regular rhythm.     Heart sounds: No murmur heard. Pulmonary:     Effort: Pulmonary effort is normal. No respiratory distress.     Breath sounds: Normal breath sounds.  Chest:     Chest wall: Tenderness present.     Comments: Patient tender right lateral ribs.  No crepitus. Abdominal:     Palpations: Abdomen is soft.     Tenderness: There is no abdominal tenderness.  Musculoskeletal:     Cervical back: Neck supple.  Skin:    General: Skin is warm and dry.  Neurological:     Mental Status: He is alert.    ED Results / Procedures / Treatments   Labs (all labs ordered are listed, but only abnormal results are displayed) Labs Reviewed - No data to display  EKG None  Radiology DG Ribs Unilateral W/Chest Right  Result Date: 08/14/2020 CLINICAL DATA:  50 year old male with right rib pain after fall between a tractor and a loading dock at work. Former smoker. EXAM: RIGHT RIBS AND CHEST - 3+  VIEW COMPARISON:  Chest radiographs 04/13/2020 and earlier. FINDINGS: Lung volumes and mediastinal contours are stable and within normal limits. Visualized tracheal air column is within normal limits. Stable mild increased interstitial markings. No pneumothorax or pleural effusion. Negative visible bowel gas. Two oblique views of the right ribs. Minimally displaced fractures of the anterior right 7th and 8th ribs. Anterior right  8th rib is the area marked on the skin surface. No other right rib fracture identified. Mild lower thoracic compression fracture is stable. Other visible osseous structures appear intact. IMPRESSION: 1. Minimally displaced anterior right 7th and 8th rib fractures. 2.  No acute cardiopulmonary abnormality. Electronically Signed   By: Genevie Ann M.D.   On: 08/14/2020 07:42    Procedures Procedures   Medications Ordered in ED Medications  HYDROcodone-acetaminophen (NORCO/VICODIN) 5-325 MG per tablet 2 tablet (2 tablets Oral Given 08/14/20 0750)    ED Course  I have reviewed the triage vital signs and the nursing notes.  Pertinent labs & imaging results that were available during my care of the patient were reviewed by me and considered in my medical decision making (see chart for details).  Clinical Course as of 08/14/20 1734  Mon Aug 14, 2020  0740 Chest x-ray interpreted by me as no pneumothorax.  Does appear to have at least 1 maybe 2 rib fractures.  Awaiting radiology reading.  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 [MB]    Clinical Course User Index [MB] Hayden Rasmussen, MD   MDM Rules/Calculators/A&P                         50 year old male here with blunt chest trauma 4 days ago clinically has signs of rib fracture.  X-ray showing no pneumothorax, 2 rib fractures.  Reviewed with patient.  He is comfortable plan for outpatient management of this with pain medication and other local treatment.  Return instructions discussed  Final Clinical  Impression(s) / ED Diagnoses Final diagnoses:  Closed fracture of multiple ribs of right side, initial encounter    Rx / DC Orders ED Discharge Orders          Ordered    HYDROcodone-acetaminophen (NORCO/VICODIN) 5-325 MG tablet  Every 6 hours PRN        08/14/20 0751             Hayden Rasmussen, MD 08/14/20 1735

## 2020-08-14 NOTE — ED Triage Notes (Signed)
Reports he fell between a tractor and a lift gate at work on Thursday.  Having pain in right ribcage since.  Taking aleve with no relief.

## 2020-08-14 NOTE — ED Notes (Signed)
ED Provider at bedside. 

## 2021-01-11 ENCOUNTER — Emergency Department (HOSPITAL_BASED_OUTPATIENT_CLINIC_OR_DEPARTMENT_OTHER): Payer: BC Managed Care – PPO

## 2021-01-11 ENCOUNTER — Encounter (HOSPITAL_BASED_OUTPATIENT_CLINIC_OR_DEPARTMENT_OTHER): Payer: Self-pay

## 2021-01-11 ENCOUNTER — Other Ambulatory Visit: Payer: Self-pay

## 2021-01-11 ENCOUNTER — Emergency Department (HOSPITAL_BASED_OUTPATIENT_CLINIC_OR_DEPARTMENT_OTHER)
Admission: EM | Admit: 2021-01-11 | Discharge: 2021-01-11 | Disposition: A | Discharge status: Home / Self Care | Payer: BC Managed Care – PPO | Attending: Emergency Medicine | Admitting: Emergency Medicine

## 2021-01-11 DIAGNOSIS — M791 Myalgia, unspecified site: Secondary | ICD-10-CM | POA: Diagnosis not present

## 2021-01-11 DIAGNOSIS — R059 Cough, unspecified: Secondary | ICD-10-CM | POA: Insufficient documentation

## 2021-01-11 DIAGNOSIS — R509 Fever, unspecified: Secondary | ICD-10-CM | POA: Diagnosis not present

## 2021-01-11 DIAGNOSIS — I10 Essential (primary) hypertension: Secondary | ICD-10-CM | POA: Diagnosis not present

## 2021-01-11 DIAGNOSIS — R7401 Elevation of levels of liver transaminase levels: Secondary | ICD-10-CM | POA: Insufficient documentation

## 2021-01-11 DIAGNOSIS — Z20822 Contact with and (suspected) exposure to covid-19: Secondary | ICD-10-CM | POA: Insufficient documentation

## 2021-01-11 DIAGNOSIS — R7989 Other specified abnormal findings of blood chemistry: Secondary | ICD-10-CM

## 2021-01-11 DIAGNOSIS — R1013 Epigastric pain: Secondary | ICD-10-CM | POA: Diagnosis not present

## 2021-01-11 DIAGNOSIS — R109 Unspecified abdominal pain: Secondary | ICD-10-CM | POA: Diagnosis not present

## 2021-01-11 DIAGNOSIS — Z7951 Long term (current) use of inhaled steroids: Secondary | ICD-10-CM | POA: Insufficient documentation

## 2021-01-11 DIAGNOSIS — R17 Unspecified jaundice: Secondary | ICD-10-CM

## 2021-01-11 DIAGNOSIS — R197 Diarrhea, unspecified: Secondary | ICD-10-CM | POA: Diagnosis not present

## 2021-01-11 DIAGNOSIS — R112 Nausea with vomiting, unspecified: Secondary | ICD-10-CM | POA: Diagnosis not present

## 2021-01-11 DIAGNOSIS — Z87891 Personal history of nicotine dependence: Secondary | ICD-10-CM | POA: Insufficient documentation

## 2021-01-11 LAB — CBC WITH DIFFERENTIAL/PLATELET
Abs Immature Granulocytes: 0.01 10*3/uL (ref 0.00–0.07)
Basophils Absolute: 0.1 10*3/uL (ref 0.0–0.1)
Basophils Relative: 2 %
Eosinophils Absolute: 0.2 10*3/uL (ref 0.0–0.5)
Eosinophils Relative: 4 %
HCT: 43.1 % (ref 39.0–52.0)
Hemoglobin: 14.5 g/dL (ref 13.0–17.0)
Immature Granulocytes: 0 %
Lymphocytes Relative: 23 %
Lymphs Abs: 1.5 10*3/uL (ref 0.7–4.0)
MCH: 33.1 pg (ref 26.0–34.0)
MCHC: 33.6 g/dL (ref 30.0–36.0)
MCV: 98.4 fL (ref 80.0–100.0)
Monocytes Absolute: 0.5 10*3/uL (ref 0.1–1.0)
Monocytes Relative: 8 %
Neutro Abs: 4.1 10*3/uL (ref 1.7–7.7)
Neutrophils Relative %: 63 %
Platelets: 169 10*3/uL (ref 150–400)
RBC: 4.38 MIL/uL (ref 4.22–5.81)
RDW: 14.1 % (ref 11.5–15.5)
WBC: 6.4 10*3/uL (ref 4.0–10.5)
nRBC: 0 % (ref 0.0–0.2)

## 2021-01-11 LAB — COMPREHENSIVE METABOLIC PANEL
ALT: 27 U/L (ref 0–44)
AST: 160 U/L — ABNORMAL HIGH (ref 15–41)
Albumin: 2.7 g/dL — ABNORMAL LOW (ref 3.5–5.0)
Alkaline Phosphatase: 218 U/L — ABNORMAL HIGH (ref 38–126)
Anion gap: 9 (ref 5–15)
BUN: 6 mg/dL (ref 6–20)
CO2: 23 mmol/L (ref 22–32)
Calcium: 8.1 mg/dL — ABNORMAL LOW (ref 8.9–10.3)
Chloride: 101 mmol/L (ref 98–111)
Creatinine, Ser: 0.7 mg/dL (ref 0.61–1.24)
GFR, Estimated: >60 mL/min (ref >60)
Glucose, Bld: 108 mg/dL — ABNORMAL HIGH (ref 70–99)
Potassium: 3.5 mmol/L (ref 3.5–5.1)
Sodium: 133 mmol/L — ABNORMAL LOW (ref 135–145)
Total Bilirubin: 4.7 mg/dL — ABNORMAL HIGH (ref 0.3–1.2)
Total Protein: 7.8 g/dL (ref 6.5–8.1)

## 2021-01-11 LAB — RESP PANEL BY RT-PCR (FLU A&B, COVID) ARPGX2
Influenza A by PCR: NEGATIVE
Influenza B by PCR: NEGATIVE
SARS Coronavirus 2 by RT PCR: NEGATIVE

## 2021-01-11 LAB — LIPASE, BLOOD: Lipase: 30 U/L (ref 11–51)

## 2021-01-11 IMAGING — US US ABDOMEN LIMITED
1 series · 14 of 25 positions shown · non-contrast
Comparison: CT [DATE]

CLINICAL DATA: Abdominal pain

EXAM:
ULTRASOUND ABDOMEN LIMITED RIGHT UPPER QUADRANT

[Series 1: us abdomen limited · 59 acquisitions, 14 frames shown]
[im 1/59]
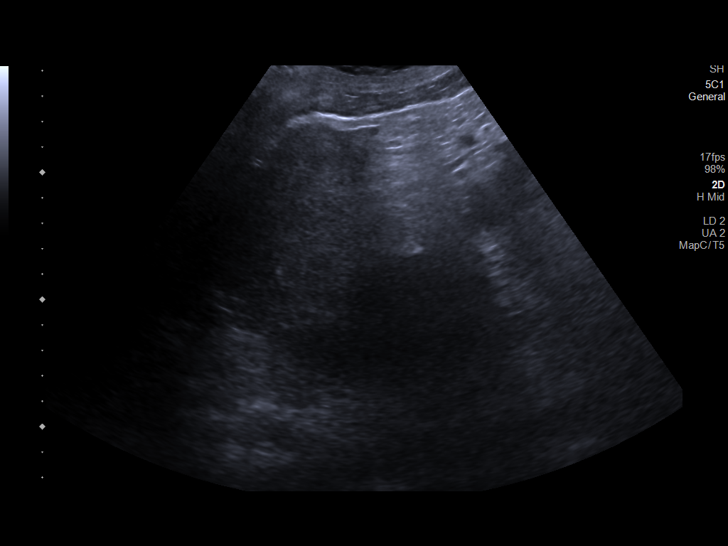
[im 5/59]
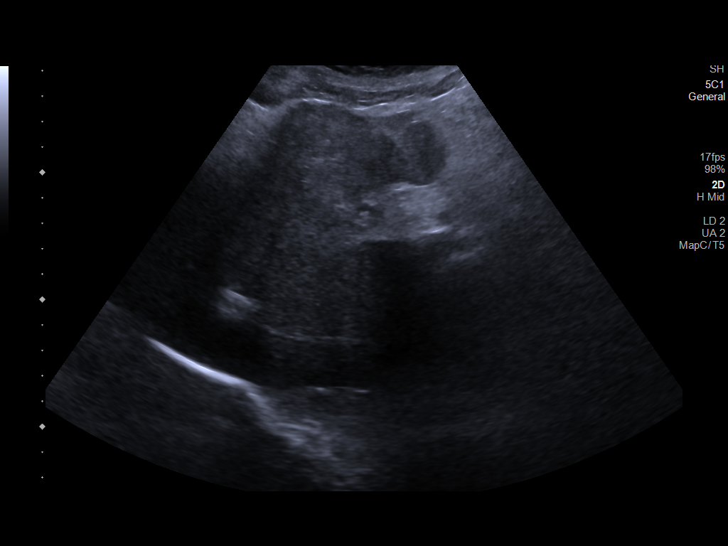
[im 10/59]
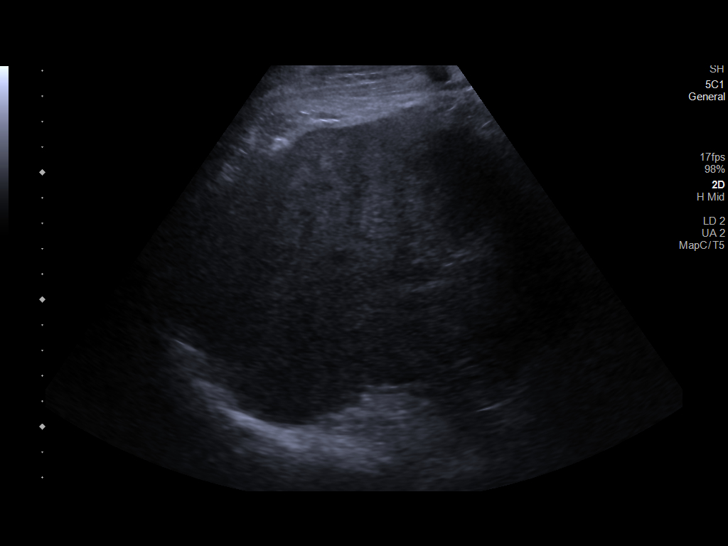
[im 15/59]
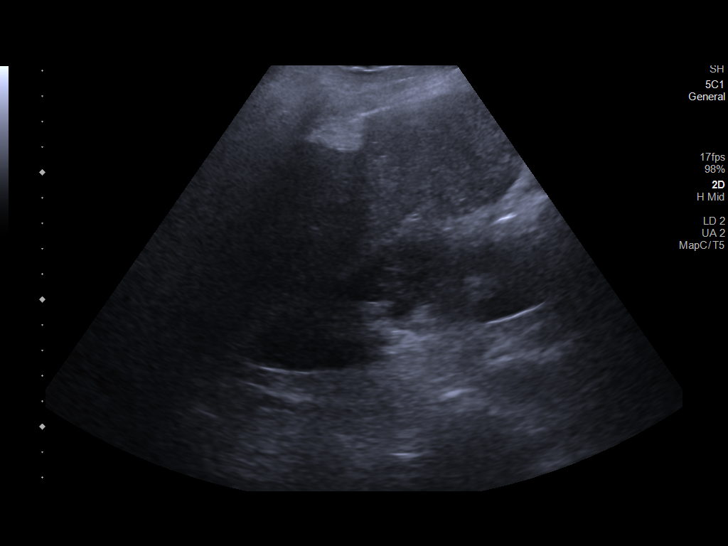
[im 20/59]
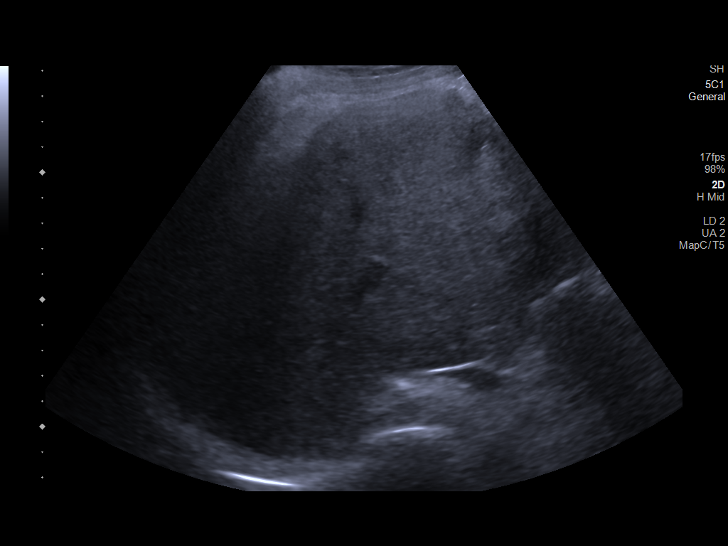
[im 22/59]
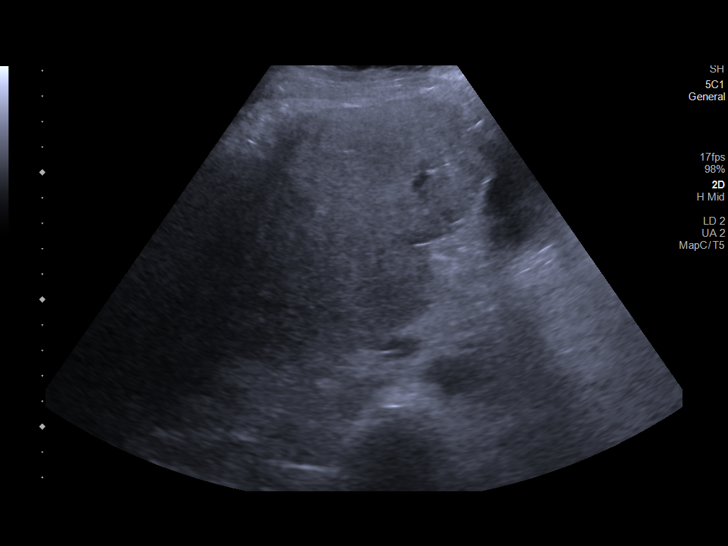
[im 27/59]
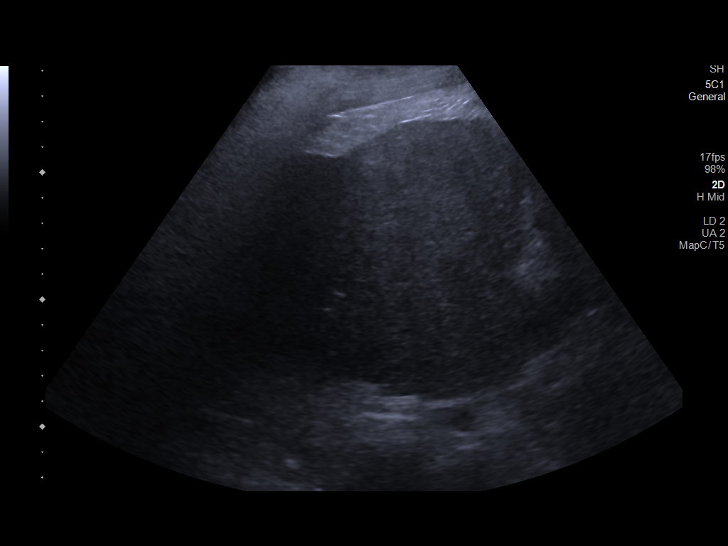
[im 32/59]
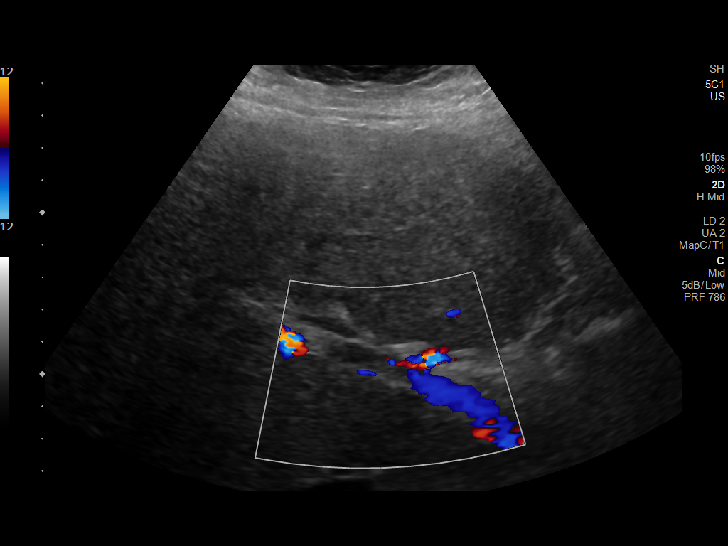
[im 37/59]
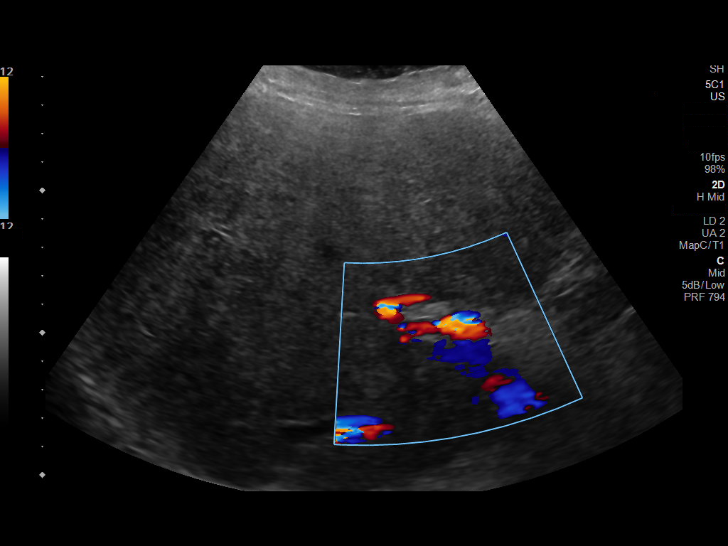
[im 39/59]
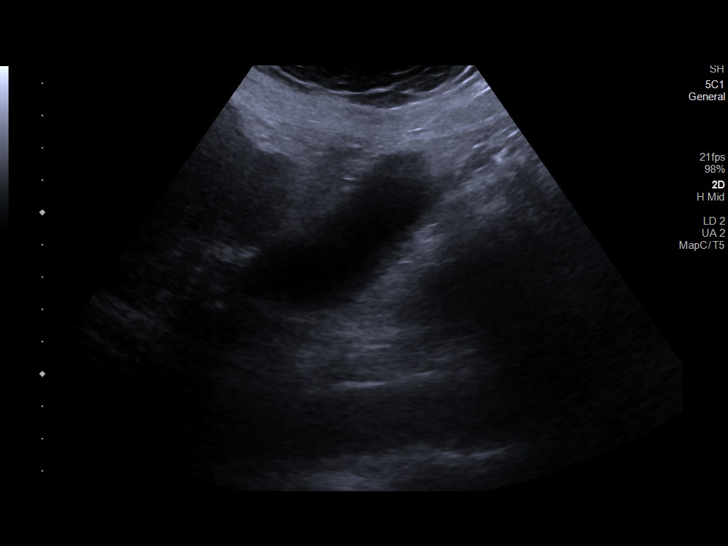
[im 44/59]
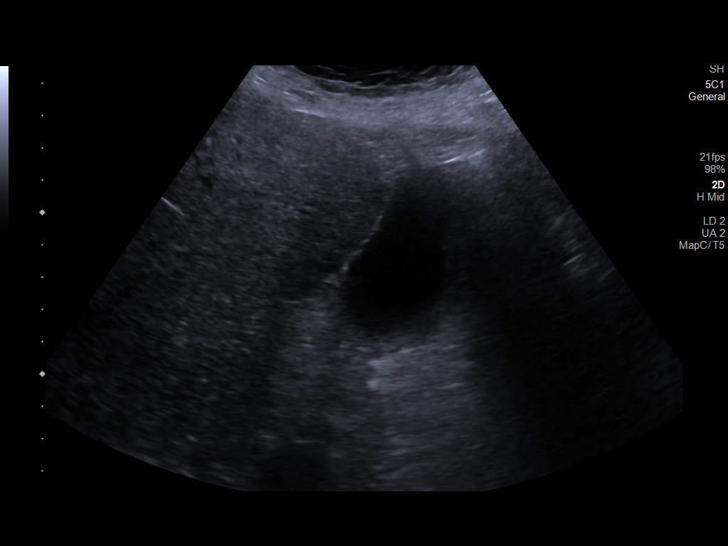
[im 49/59]
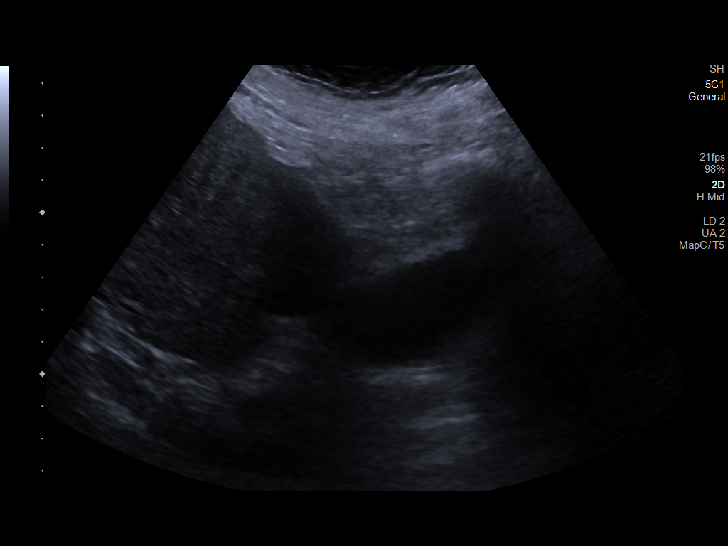
[im 54/59]
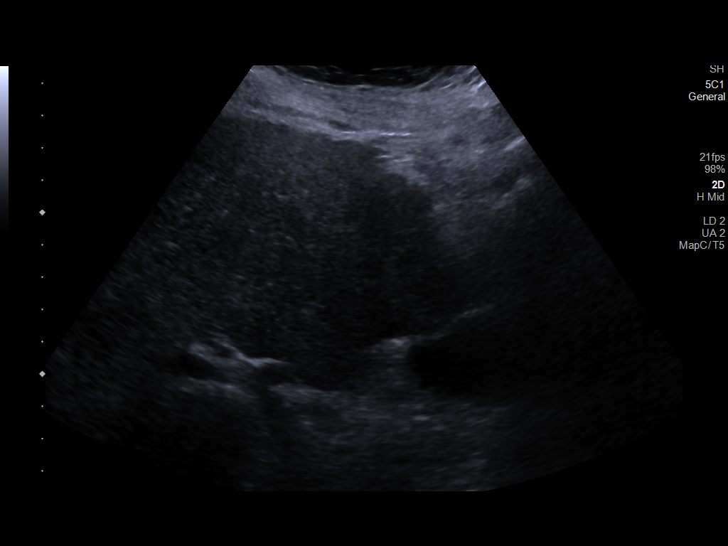
[im 59/59]
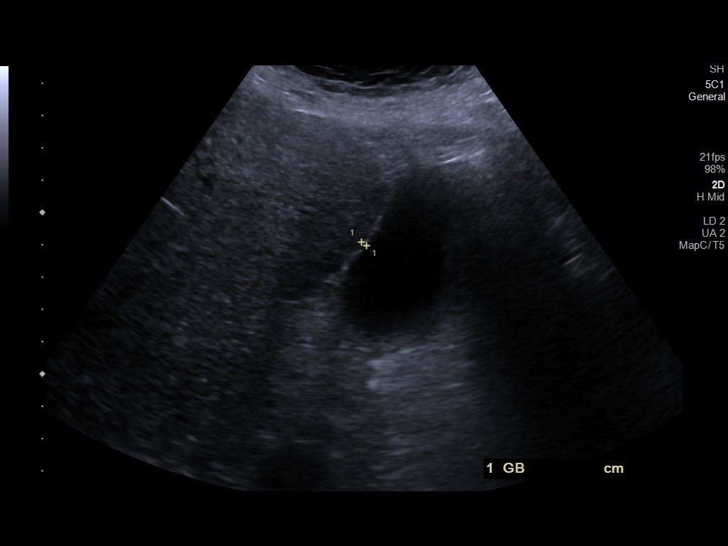

[14 of 25 positions shown; findings below may reference images not displayed]

FINDINGS: Gallbladder:

No gallstones or wall thickening visualized. No sonographic Murphy
sign noted by sonographer.

Common bile duct:

Diameter: 4.6 mm

Liver:

Coarse liver echogenicity with nodular contours. Reversal of flow in
the portal vein.

Other: None.
IMPRESSION: Coarse liver echogenicity with nodular contours suggestive of
chronic liver disease/hepatic cirrhosis.

Reversal of flow in the portal vein suggesting portal hypertension.

## 2021-01-11 MED ORDER — KETOROLAC TROMETHAMINE 30 MG/ML IJ SOLN
30.0000 mg | Freq: Once | INTRAMUSCULAR | Status: AC
  Administered 2021-01-11: 11:00:00 30 mg via INTRAVENOUS
  Filled 2021-01-11: qty 1

## 2021-01-11 MED ORDER — ONDANSETRON HCL 4 MG PO TABS: 4.0000 mg | ORAL_TABLET | Freq: Three times a day (TID) | ORAL | 0 refills | Status: DC | PRN

## 2021-01-11 MED ORDER — SODIUM CHLORIDE 0.9 % IV BOLUS
1000.0000 mL | Freq: Once | INTRAVENOUS | Status: AC
  Administered 2021-01-11: 11:00:00 1000 mL via INTRAVENOUS

## 2021-01-11 MED ORDER — ONDANSETRON HCL 4 MG/2ML IJ SOLN
4.0000 mg | Freq: Once | INTRAMUSCULAR | Status: AC
  Administered 2021-01-11: 11:00:00 4 mg via INTRAVENOUS
  Filled 2021-01-11: qty 2

## 2021-01-11 NOTE — Discharge Instructions (Signed)
You have been seen and discharged from the emergency department.  Your liver function test and bilirubin was elevated today.  Otherwise your work-up is reassuring, ultrasound of the gallbladder was normal.  It is important that you follow-up with GI for continued monitoring and testing of these results.  Reestablish care with [facility] for your lung sarcoidosis.  Follow-up with your primary provider for reevaluation and further care. Take home medications as prescribed. If you have any worsening symptoms or further concerns for your health please return to an emergency department for further evaluation.

## 2021-01-11 NOTE — ED Provider Notes (Signed)
[facility] EMERGENCY DEPARTMENT Provider Note   CSN: [id number] Arrival date & time: 01/11/21  0944     History Chief Complaint  Patient presents with   Vomiting    Trevor Reilly is a 50 y.o. male.  HPI  50 year old male with past medical history of HTN, long sarcoidosis presents emergency department with 1 day of nausea/vomiting/diarrhea, fever/chills and body aches.  Patient states that this started yesterday, initially started with nausea and vomiting.  He has not been able to tolerate anything p.o. since yesterday.  Endorses nonbloody emesis and stools.  He has now developed a nonproductive cough, generalized headache and body aches.  No known sick contacts.  He has some mild epigastric abdominal pain.  No previous surgeries in the abdomen.  Past Medical History:  Diagnosis Date   Chronic cough    05-22-2020 per pt morning productive with yellow sputum   Depression    ED (erectile dysfunction)    Epididymal cyst    left   Fatty liver    05-22-2020  per pt has been referred to GI,  last ultrasound in epic 06-10-2016   GERD (gastroesophageal reflux disease)    History of community acquired pneumonia 04/13/2020   ED visit in epic   History of rectal bleeding    Hypertension    05-22-2020 followed by new pcp--- no medication currently   Mild obstructive sleep apnea    05-22-2020  per pt had study approx. 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   Past Surgical History:  Procedure Laterality Date   CYSTOSCOPY  05/24/2020   Procedure: CYSTOSCOPY FLEXIBLE;  Surgeon: Lucas Mallow, MD;  Location: [facility];  Service: Urology;;   EPIDIDYMECTOMY Left 05/24/2020   Procedure: LEFT EPIDIDYMAL CYST REMOVAL;  Surgeon: Lucas Mallow, MD;  Location: [facility];  Service: Urology;  Laterality: Left;   LUNG BIOPSY  2007   IR   CT guided needle lung biopsy's and washing's        Family History  Problem Relation Age of Onset   Gout Mother    Hypertension Mother    Thyroid disease Mother    Stroke Father    Heart attack Father    Thyroid disease Sister    Hypertension Brother    Sarcoidosis Cousin     Social History   Tobacco Use   Smoking status: Former    Packs/day: 0.50    Years: 25.00    Pack years: 12.50    Types: Cigarettes    Quit date: 04/24/2020    Years since quitting: 0.7   Smokeless tobacco: Never  Vaping Use   Vaping Use: Never used  Substance Use Topics   Drug use: Never    Home Medications Prior to Admission medications   Medication Sig Start Date End Date Taking? Authorizing Provider  acetaminophen (TYLENOL 8 HOUR) 650 MG CR tablet Take 1 tablet (650 mg total) by mouth every 8 (eight) hours. 04/13/20  Varney Biles, MD  albuterol (VENTOLIN HFA) 108 (90 Base) MCG/ACT inhaler Inhale into the lungs as needed. 07/18/15   [provider]  HYDROcodone-acetaminophen (NORCO/VICODIN) 5-325 MG tablet Take 1 tablet by mouth every 6 (six) hours as needed for severe pain. 08/14/20 08/14/21  Hayden Rasmussen, MD  pantoprazole (PROTONIX) 40 MG tablet Take 1 tablet (40 mg total) by mouth daily. Patient taking differently: Take 40 mg by mouth every evening. 12/11/19   Lennice Sites, DO    Allergies    Patient has no known allergies.  Review of Systems   Review of Systems  Constitutional:  Positive for appetite change, chills, fatigue and fever.  HENT:  Negative for congestion.   Eyes:  Negative for visual disturbance.  Respiratory:  Negative for shortness of breath.   Cardiovascular:  Negative  for chest pain.  Gastrointestinal:  Positive for abdominal pain, diarrhea, nausea and vomiting.  Genitourinary:  Negative for dysuria.  Musculoskeletal:  Positive for myalgias.  Skin:  Negative for rash.  Neurological:  Negative for headaches.   Physical Exam Updated Vital Signs BP (!) 151/102 (BP Location: Right Arm)   Pulse 79   Temp 98.8 F (37.1 C) (Oral)   Resp 18   Ht 5\' 9"  (1.753 m)   Wt 95.3 kg   SpO2 99%   BMI 31.01 kg/m   Physical Exam Vitals and nursing note reviewed.  Constitutional:      General: He is not in acute distress.    Appearance: Normal appearance.  HENT:     Head: Normocephalic.     Mouth/Throat:     Mouth: Mucous membranes are moist.  Cardiovascular:     Rate and Rhythm: Normal rate.  Pulmonary:     Effort: Pulmonary effort is normal. No respiratory distress.  Abdominal:     General: There is no distension.     Palpations: Abdomen is soft.     Tenderness: There is abdominal tenderness. There is no guarding or rebound.  Skin:    General: Skin is warm.  Neurological:     Mental Status: He is alert and oriented to person, place, and time. Mental status is at baseline.  Psychiatric:        Mood and Affect: Mood normal.    ED Results / Procedures / Treatments   Labs (all labs ordered are listed, but only abnormal results are displayed) Labs Reviewed  RESP PANEL BY RT-PCR (FLU A&B, COVID) ARPGX2  CBC WITH DIFFERENTIAL/PLATELET  COMPREHENSIVE METABOLIC PANEL  LIPASE, BLOOD    EKG None  Radiology No results found.  Procedures Procedures   Medications Ordered in ED Medications  sodium chloride 0.9 % bolus 1,000 mL (has no administration in time range)  ondansetron (ZOFRAN) injection 4 mg (has no administration in time range)  ketorolac (TORADOL) 30 MG/ML injection 30 mg (has no administration in time range)    ED Course  I have reviewed the triage vital signs and the nursing notes.  Pertinent labs & imaging results that were  available during my care of the patient were reviewed by me and considered in my medical decision making (see chart for details).    MDM Rules/Calculators/A&P                           50 year old male presents emergency department with nausea/vomiting/diarrhea and epigastric abdominal pain.  He is afebrile on arrival, minor tenderness in the epigastric region.  Blood work shows an elevated  AST, alkaline phosphatase and bilirubin up to 4.7.  He is had mild elevated bilirubin in the past.  Ultrasound of the right upper quadrant shows no concerning gallbladder findings.  There is nodularity of the liver.  I discussed these results with the patient he states he has had elevated enzymes before.  He had outpatient testing including HIV/hepatitis.  He has an outpatient follow-up with GI to address the above findings.  After IV medication patient feels significantly improved.  Has been able to p.o.  Most likely viral illness in regards to the nausea/vomiting/diarrhea.  Will send with symptomatic medication and encourage hydration.  He will follow-up with GI and his primary doctor.  Patient at this time appears safe and stable for discharge and will be treated as an outpatient.  Discharge plan and strict return to ED precautions discussed, patient verbalizes understanding and agreement.  Final Clinical Impression(s) / ED Diagnoses Final diagnoses:  None    Rx / DC Orders ED Discharge Orders     None        Lorelle Gibbs, DO 01/11/21 1500

## 2021-01-11 NOTE — ED Triage Notes (Signed)
Pt c/o vomiting, diarrhea, cough, headache, fever, body aches since yesterday. Unable to tolerate PO.

## 2021-01-26 DIAGNOSIS — K7460 Unspecified cirrhosis of liver: Secondary | ICD-10-CM | POA: Diagnosis not present

## 2021-01-26 DIAGNOSIS — D860 Sarcoidosis of lung: Secondary | ICD-10-CM | POA: Diagnosis not present

## 2021-01-26 DIAGNOSIS — R7989 Other specified abnormal findings of blood chemistry: Secondary | ICD-10-CM | POA: Diagnosis not present

## 2021-01-26 DIAGNOSIS — Z23 Encounter for immunization: Secondary | ICD-10-CM | POA: Diagnosis not present

## 2021-02-07 ENCOUNTER — Encounter: Payer: Self-pay | Admitting: Internal Medicine

## 2021-02-14 ENCOUNTER — Ambulatory Visit (INDEPENDENT_AMBULATORY_CARE_PROVIDER_SITE_OTHER): Payer: BC Managed Care – PPO | Admitting: Orthopaedic Surgery

## 2021-02-14 ENCOUNTER — Ambulatory Visit: Payer: Self-pay

## 2021-02-14 ENCOUNTER — Encounter: Payer: Self-pay | Admitting: Orthopaedic Surgery

## 2021-02-14 ENCOUNTER — Other Ambulatory Visit: Payer: Self-pay

## 2021-02-14 DIAGNOSIS — M25512 Pain in left shoulder: Secondary | ICD-10-CM

## 2021-02-14 DIAGNOSIS — G8929 Other chronic pain: Secondary | ICD-10-CM | POA: Diagnosis not present

## 2021-02-14 NOTE — Progress Notes (Signed)
Office Visit Note   Patient: Trevor Reilly           Date of Birth: 06-15-1970           MRN: [id number] Visit Date: 02/14/2021              Requested by: Delford Field, FNP [phone],  [place] [id number] PCP: Delford Field, FNP   Assessment & Plan: Visit Diagnoses:  1. Chronic left shoulder pain     Plan: Impression is left shoulder rotator cuff tendinitis versus less likely tear.  At this point, I would like to try a subacromial cortisone injection to the left shoulder.  If his symptoms have not improved over the next 2 to 3 weeks he will give [place] a call or we will order an MRI of the shoulder to assess for rotator cuff pathology.  Otherwise, follow-up with [place] as needed.    Follow-Up Instructions: Return if symptoms worsen or fail to improve.   Orders:  Orders Placed This Encounter  Procedures   Large Joint Inj   XR Shoulder Left   No orders of the defined types were placed in this encounter.     Procedures: Large Joint Inj: L subacromial bursa on 02/14/2021 9:49 AM Indications: pain Details: 22 G needle Medications: 2 mL lidocaine 2 %; 2 mL bupivacaine 0.25 %; 40 mg methylPREDNISolone acetate 40 MG/ML Outcome: tolerated well, no immediate complications Patient was prepped and draped in the usual sterile fashion.      Clinical Data: No additional findings.   Subjective: Chief Complaint  Patient presents with   Left Shoulder - Pain   Lower Back - Pain    HPI patient is a pleasant 50 year old gentleman who comes in today with left shoulder pain.  He was at work few months ago when he fell between a truck and the loading dock.  He notes that he hit his left shoulder against a loading dock and his right torso against the truck.  He has been complaining of pain to the left deltoid since.  He denies any pain at rest.  No weakness.  The pain he has is primarily at night when he is lying on the side as well as with any pushing, pulling or  lifting.  He has been taking Advil without significant relief.  No previous injection or injury to the left shoulder.  Review of Systems as detailed in HPI.  All others reviewed and are negative.   Objective: Vital Signs: There were no vitals taken for this visit.  Physical Exam well-developed well-nourished gentleman no acute distress.  Alert and oriented x3.  Ortho Exam left shoulder exam shows forward flexion to 100 degrees.  Abduction external rotation to 45 degrees.  Internal rotation to L5.  Does have a positive empty can positive belly press.  Equal strength to both shoulders with resisted internal and external rotation.  He is neurovascular intact distally.  Specialty Comments:  No specialty comments available.  Imaging: XR Shoulder Left  Result Date: 02/14/2021 X-rays demonstrate mild degenerative changes to the [facility] and glenohumeral joint    PMFS History: There are no problems to display for this patient.  Past Medical History:  Diagnosis Date   Chronic cough    05-22-2020 per pt morning productive with yellow sputum   Depression    ED (erectile dysfunction)    Epididymal cyst    left   Fatty liver    05-22-2020  per pt  has been referred to GI,  last ultrasound in epic 06-10-2016   GERD (gastroesophageal reflux disease)    History of community acquired pneumonia 04/13/2020   ED visit in epic   History of rectal bleeding    Hypertension    05-22-2020 followed by new pcp--- no medication currently   Mild obstructive sleep apnea    05-22-2020  per pt had study approx. 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   05-19-2020  currently started seeing new pcp,  previous seen by pulmonolgy , Elijio Miles PA([facility]) lov note 05-20-2016 [facility]   Sarcoidosis of skin    05-22-2020  per pt lower legs   Wears glasses     Family History   Problem Relation Age of Onset   Gout Mother    Hypertension Mother    Thyroid disease Mother    Stroke Father    Heart attack Father    Thyroid disease Sister    Hypertension Brother    Sarcoidosis Cousin     Past Surgical History:  Procedure Laterality Date   CYSTOSCOPY  05/24/2020   Procedure: CYSTOSCOPY FLEXIBLE;  Surgeon: Lucas Mallow, MD;  Location: [facility];  Service: Urology;;   EPIDIDYMECTOMY Left 05/24/2020   Procedure: LEFT EPIDIDYMAL CYST REMOVAL;  Surgeon: Lucas Mallow, MD;  Location: [facility];  Service: Urology;  Laterality: Left;   LUNG BIOPSY  2007   IR   CT guided needle lung biopsy's and washing's    Social History   Occupational History   Not on file  Tobacco Use   Smoking status: Former    Packs/day: 0.50    Years: 25.00    Pack years: 12.50    Types: Cigarettes    Quit date: 04/24/2020    Years since quitting: 0.8   Smokeless tobacco: Never  Vaping Use   Vaping Use: Never used  Substance and Sexual Activity   Alcohol use: Not on file    Comment: weekends   Drug use: Never   Sexual activity: Not on file

## 2021-02-15 MED ORDER — BUPIVACAINE HCL 0.25 % IJ SOLN
2.0000 mL | INTRAMUSCULAR | Status: AC | PRN
  Administered 2021-02-14: 10:00:00 2 mL via INTRA_ARTICULAR

## 2021-02-15 MED ORDER — LIDOCAINE HCL 2 % IJ SOLN
2.0000 mL | INTRAMUSCULAR | Status: AC | PRN
  Administered 2021-02-14: 10:00:00 2 mL

## 2021-02-15 MED ORDER — METHYLPREDNISOLONE ACETATE 40 MG/ML IJ SUSP
40.0000 mg | INTRAMUSCULAR | Status: AC | PRN
  Administered 2021-02-14: 10:00:00 40 mg via INTRA_ARTICULAR

## 2021-02-21 ENCOUNTER — Other Ambulatory Visit: Payer: Self-pay

## 2021-02-21 ENCOUNTER — Encounter: Payer: Self-pay | Admitting: Pulmonary Disease

## 2021-02-21 ENCOUNTER — Ambulatory Visit (INDEPENDENT_AMBULATORY_CARE_PROVIDER_SITE_OTHER): Payer: BC Managed Care – PPO | Admitting: Pulmonary Disease

## 2021-02-21 VITALS — BP 138/76 | HR 78 | Temp 97.8°F | Ht 69.0 in | Wt 208.0 lb

## 2021-02-21 DIAGNOSIS — D869 Sarcoidosis, unspecified: Secondary | ICD-10-CM

## 2021-02-21 NOTE — Patient Instructions (Signed)
Nice to meet you  Given your breathing seems fine and your chest x-rays have looked okay over the last few years, I do not think the sarcoidosis is affecting the lungs currently.  It is possible the cough is related to sarcoidosis.  I recommend using the Symbicort prescribed by your primary care provider, 2 puffs twice a day.  Rinse her mouth after every use.  Please let me know if you need refills or other recommendations for inhalers in the future.  The cough could be related to nasal congestion, sinus congestion.  I recommend using sinus rinse with sterile or cleaned water versus boiled water once it cools.  Use this twice a day.  Do 1 nostril in the morning, the other nostril in the evening.  There are salt packets that need to be added to the water.  You can get these at the pharmacy over-the-counter, look in the sinus or cough syrup area of the pharmacy.  After using the sinus rinse, use Flonase 2 spray each nostril twice a day for 1 week then you can decrease to 1 spray each nostril twice a day.  Continue taking Zyrtec every night.  If the sinus congestion continues despite these efforts, I will refer you to an ear nose and throat doctor to make sure there is no blockage that is causing the sinus issues that may not improve with the nasal sprays and medications.  I sent a referral to an ophthalmologist for yearly eye checks.  This is very important.  Return to clinic in 3 months or sooner as needed with Dr. Silas Flood

## 2021-02-21 NOTE — Progress Notes (Signed)
@Patient  ID: Trevor Reilly, male    DOB: [birth year], 50 y.o.   MRN: [id number]  Chief Complaint  Patient presents with   Consult    Consult for sarcoidosis. Been occurring since 2007. So far only his sinus and allergies are acting up     Referring provider: Delford Field, FNP  HPI:   50 y.o. man whom we are seeing in consultation for evaluation of sarcoidosis.  Note from PCP reviewed.  Patient overall doing well.  Denies any respiratory symptoms.  No shortness of breath, dyspnea.  Diagnosed with sarcoidosis per report 2007.  Via endobronchial biopsy presumably of lymph node.  Was placed on steroids at that time.  Unclear dose.  Was on this for about 10 years.  He was lost to follow-up to prior pulmonologist at [facility].  Has been off prednisone for 4 years.  No development of respiratory symptoms as above.  His primary complaint is cough.  Productive of phlegm.  Associate with significant sinus congestion, nasal congestion, rhinorrhea.  He is unsure if he feels sensation of phlegm dripping on the back of his throat from his nasal passages.  Zyrtec helps some.  Flonase not very helpful per his report.  No other relieving or exacerbating factors that he can identify.  Cough present for many many years.  Reviewed serial chest x-rays 3 from 06/2016 to 03/2020 that on My review interpretation reveals clear lungs without significant changes over the last 4 years.  PMH: Sarcoidosis, seasonal allergies, GERD Surgical history: Liver biopsy, epididymal cyst removal Family history: Cousin and uncle with sarcoidosis, mother with hypertension, father with CVA, CAD Social history: Former smoker, quit 2021, lives in [place], originally from [place] / Pulmonary Flowsheets:   ACT:  No flowsheet data found.  MMRC: No flowsheet data found.  Epworth:  No flowsheet data found.  Tests:   FENO:  No results found for: NITRICOXIDE  PFT: No flowsheet data  found.  WALK:  No flowsheet data found.  Imaging: Personally reviewed as per EMR discussion this note XR Shoulder Left  Result Date: 02/15/2021 X-rays demonstrate mild degenerative changes to the [facility] and glenohumeral joint   Lab Results: Personally reviewed CBC    Component Value Date/Time   WBC 6.4 01/11/2021 1054   RBC 4.38 01/11/2021 1054   HGB 14.5 01/11/2021 1054   HCT 43.1 01/11/2021 1054   PLT 169 01/11/2021 1054   MCV 98.4 01/11/2021 1054   MCH 33.1 01/11/2021 1054   MCHC 33.6 01/11/2021 1054   RDW 14.1 01/11/2021 1054   LYMPHSABS 1.5 01/11/2021 1054   MONOABS 0.5 01/11/2021 1054   EOSABS 0.2 01/11/2021 1054   BASOSABS 0.1 01/11/2021 1054    BMET    Component Value Date/Time   NA 133 (L) 01/11/2021 1054   K 3.5 01/11/2021 1054   CL 101 01/11/2021 1054   CO2 23 01/11/2021 1054   GLUCOSE 108 (H) 01/11/2021 1054   BUN 6 01/11/2021 1054   CREATININE 0.70 01/11/2021 1054   CALCIUM 8.1 (L) 01/11/2021 1054   GFRNONAA >60 01/11/2021 1054   GFRAA >60 09/20/2019 0128    BNP No results found for: BNP  ProBNP    Component Value Date/Time   PROBNP 12.7 01/09/2011 1500    Specialty Problems   None   No Known Allergies  Immunization History  Administered Date(s) Administered   Influenza-Unspecified 12/09/2020   Tdap 08/05/2018    Past Medical History:  Diagnosis Date  Chronic cough    05-22-2020 per pt morning productive with yellow sputum   Depression    ED (erectile dysfunction)    Epididymal cyst    left   Fatty liver    05-22-2020  per pt has been referred to GI,  last ultrasound in epic 06-10-2016   GERD (gastroesophageal reflux disease)    History of community acquired pneumonia 04/13/2020   ED visit in epic   History of rectal bleeding    Hypertension    05-22-2020 followed by new pcp--- no medication currently   Mild obstructive sleep apnea    05-22-2020  per pt had study approx. 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   05-19-2020  currently started seeing new pcp,  previous seen by pulmonolgy , Elijio Miles PA([facility]) lov note 05-20-2016 [facility]   Sarcoidosis of skin    05-22-2020  per pt lower legs   Wears glasses     Tobacco History: Social History   Tobacco Use  Smoking Status Former   Packs/day: 0.50   Years: 25.00   Pack years: 12.50   Types: Cigarettes   Quit date: 04/24/2020   Years since quitting: 0.8  Smokeless Tobacco Never   Counseling given: Not Answered   Continue to not smoke  Outpatient Encounter Medications as of 02/21/2021  Medication Sig   acetaminophen (TYLENOL 8 HOUR) 650 MG CR tablet Take 1 tablet (650 mg total) by mouth every 8 (eight) hours.   albuterol (VENTOLIN HFA) 108 (90 Base) MCG/ACT inhaler Inhale into the lungs as needed.   fluticasone (FLONASE) 50 MCG/ACT nasal spray SMARTSIG:1-2 Spray(s) Both Nares Daily PRN   HYDROcodone-acetaminophen (NORCO/VICODIN) 5-325 MG tablet Take 1 tablet by mouth every 6 (six) hours as needed for severe pain.   ondansetron (ZOFRAN) 4 MG tablet Take 1 tablet (4 mg total) by mouth every 8 (eight) hours as needed for nausea or vomiting.   pantoprazole (PROTONIX) 40 MG tablet Take 1 tablet (40 mg total) by mouth daily. (Patient taking differently: Take 40 mg by mouth every evening.)   SYMBICORT 160-4.5 MCG/ACT inhaler SMARTSIG:2 Puff(s) By Mouth Morning-Evening   No facility-administered encounter medications on file as of 02/21/2021.     Review of Systems  Review of Systems  No chest pain exertion.  No orthopnea or PND.  No lower extremity swelling.  Comprehensive review of systems otherwise negative. Physical Exam  BP 138/76 (BP Location: Left Arm, Patient Position: Sitting, Cuff Size: Normal)    Pulse 78    Temp 97.8 F (36.6 C) (Oral)    Ht 5\' 9"  (1.753 m)    Wt 208 lb (94.3 kg)    SpO2 99%    BMI 30.72  kg/m   Wt Readings from Last 5 Encounters:  02/21/21 208 lb (94.3 kg)  01/11/21 210 lb (95.3 kg)  08/14/20 230 lb (104.3 kg)  05/24/20 212 lb 8 oz (96.4 kg)  04/13/20 210 lb (95.3 kg)    BMI Readings from Last 5 Encounters:  02/21/21 30.72 kg/m  01/11/21 31.01 kg/m  08/14/20 33.97 kg/m  05/24/20 31.38 kg/m  04/13/20 31.01 kg/m     Physical Exam General: Well-appearing, no acute distress Eyes: EOMI, icterus Neck: Supple, no JVP Pulmonary: Clear, no work of breathing Cardiovascular: Regular rate and rhythm, no murmurs Abdomen: Nondistended, bowel sounds present  MSK: No synovitis, no joint effusion Neuro: Normal gait, no weakness Psych: Normal mood, full affect   Assessment & Plan:   Sarcoidosis: Diagnosed by endobronchial lymph node biopsy 2007 per his report.  Was on steroids for 10+ years.  Initially did have pulmonary or respiratory symptoms of dyspnea on exertion.  These are now resolved.  Off steroids for about 4 years.  Discussed at length that given his lack of symptoms and stable chest imaging, additional steroid therapy for breathing is not needed.  Discussed the risk and benefits of steroid therapy with significant risks and ideally minimizing this in the future if possible.  No active pulmonary sarcoidosis at this time.  Patient referred to ophthalmology for yearly eye exams, establish care in case of development of uveitis in the future.  Cough: Chronic.  Unclear if related to sarcoidosis.  High suspicion more related to sinus or nasal congestion.  Describes significant nasal congestion and sputum production.  Rhinorrhea at times.  Advise starting sinus rinses twice a day with instructions on how to do this.  In addition, recommended Flonase 2 sprays twice daily for a week then decreasing to 1 spray twice daily thereafter.  Stressed importance of doing this after sinus rinses.  In addition, to continue nightly Zyrtec.  If symptoms do not improve in the coming weeks  to months would recommend ENT referral for evaluation of potential granulomas or other anatomic reason for sinus disease that would not be treated well with medications alone.   Return in about 3 months (around 05/22/2021).   Lanier Clam, MD 02/21/2021

## 2021-03-02 ENCOUNTER — Encounter: Payer: Self-pay | Admitting: Internal Medicine

## 2021-03-02 DIAGNOSIS — M549 Dorsalgia, unspecified: Secondary | ICD-10-CM | POA: Diagnosis not present

## 2021-03-02 DIAGNOSIS — S22070A Wedge compression fracture of T9-T10 vertebra, initial encounter for closed fracture: Secondary | ICD-10-CM | POA: Diagnosis not present

## 2021-03-07 ENCOUNTER — Telehealth (HOSPITAL_BASED_OUTPATIENT_CLINIC_OR_DEPARTMENT_OTHER): Payer: Self-pay

## 2021-03-07 ENCOUNTER — Other Ambulatory Visit (HOSPITAL_BASED_OUTPATIENT_CLINIC_OR_DEPARTMENT_OTHER): Payer: Self-pay | Admitting: Neurosurgery

## 2021-03-07 DIAGNOSIS — S22070A Wedge compression fracture of T9-T10 vertebra, initial encounter for closed fracture: Secondary | ICD-10-CM

## 2021-03-09 ENCOUNTER — Ambulatory Visit: Payer: BC Managed Care – PPO | Admitting: Internal Medicine

## 2021-03-10 ENCOUNTER — Ambulatory Visit (HOSPITAL_BASED_OUTPATIENT_CLINIC_OR_DEPARTMENT_OTHER)
Admission: RE | Admit: 2021-03-10 | Discharge: 2021-03-10 | Disposition: A | Discharge status: Home / Self Care | Payer: BC Managed Care – PPO | Source: Ambulatory Visit | Attending: Neurosurgery | Admitting: Neurosurgery

## 2021-03-10 ENCOUNTER — Other Ambulatory Visit: Payer: Self-pay

## 2021-03-10 DIAGNOSIS — M549 Dorsalgia, unspecified: Secondary | ICD-10-CM | POA: Diagnosis not present

## 2021-03-10 DIAGNOSIS — S22070A Wedge compression fracture of T9-T10 vertebra, initial encounter for closed fracture: Secondary | ICD-10-CM | POA: Insufficient documentation

## 2021-03-10 IMAGING — MR MR THORACIC SPINE W/O CM
5 of 8 series · 24 of 48 positions shown · non-contrast
Comparison: None.

CLINICAL DATA: Low back pain with rib and chest pain

EXAM:
MRI THORACIC SPINE WITHOUT CONTRAST
TECHNIQUE: Multiplanar, multisequence MR imaging of the thoracic spine was
performed. No intravenous contrast was administered.

[Series 5: T1 · sagittal · 3.0mm · 1.06mm/px · 3 of 9 slices shown (1 of 2)]
[im 1/9]
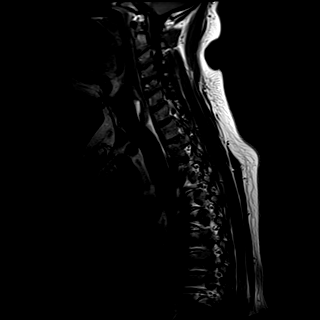
[im 5/9]
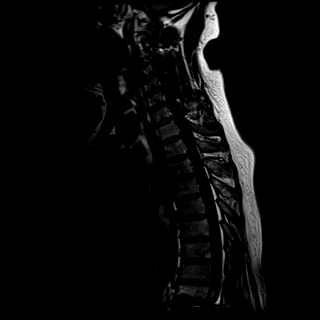
[im 9/9]
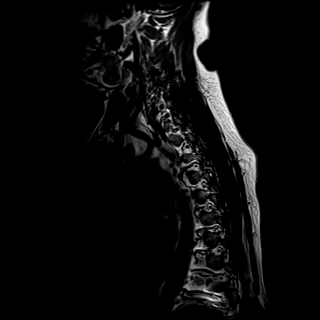

[Series 6: T2 · sagittal · 3.0mm · 1.00mm/px · 5 of 15 slices shown (1 of 3)]
[im 1/15]
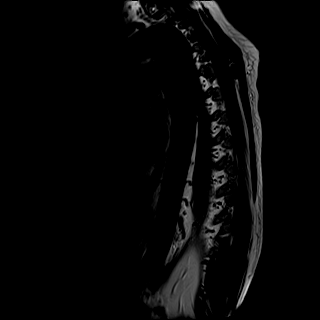
[im 4/15]
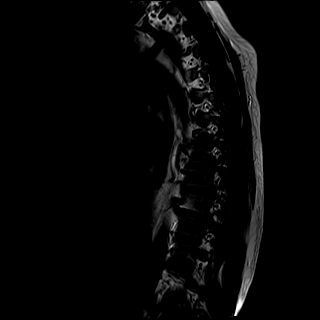
[im 8/15]
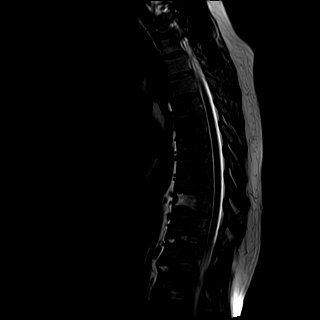
[im 11/15]
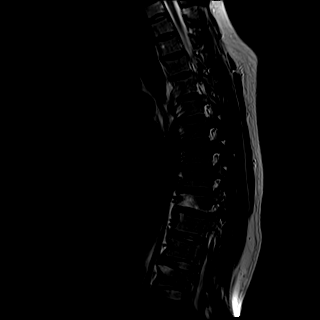
[im 15/15]
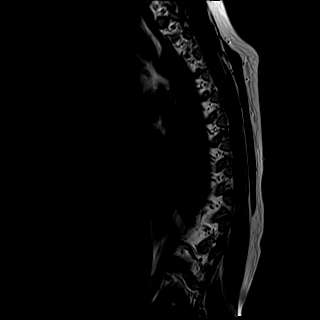

[Series 7: T1 · sagittal · 3.0mm · 0.50mm/px · 1 of 15 slices shown (2 of 2)]
[im 1/15]
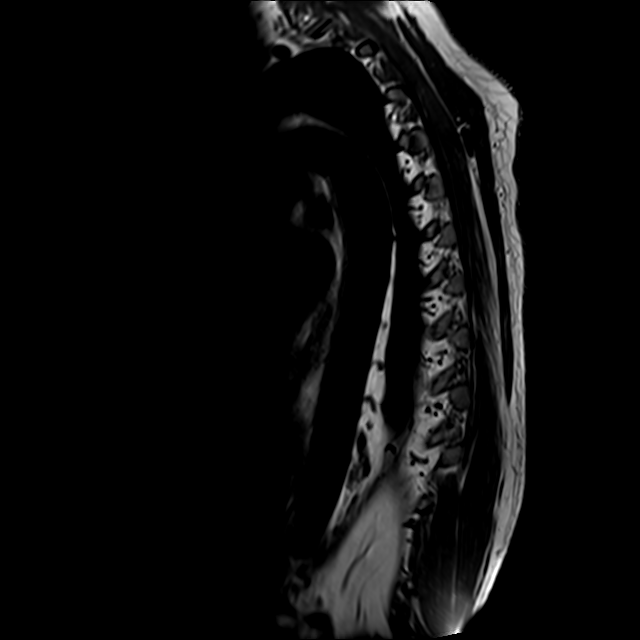

[Series 9: T2 · axial · 4.0mm · 0.39mm/px · z∈[-73,+24]mm · 7 of 18 slices shown (2 of 3)]
[im 1/18]
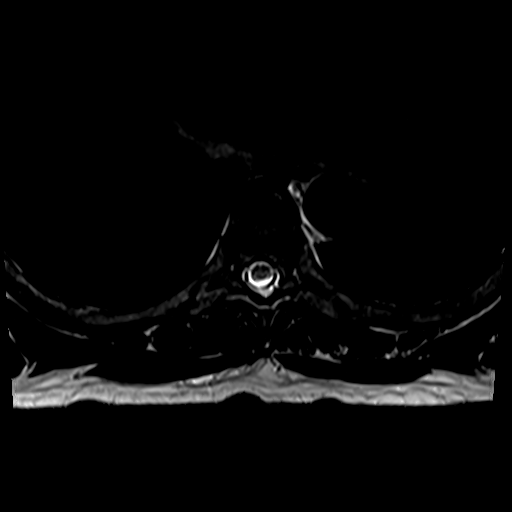
[im 3/18]
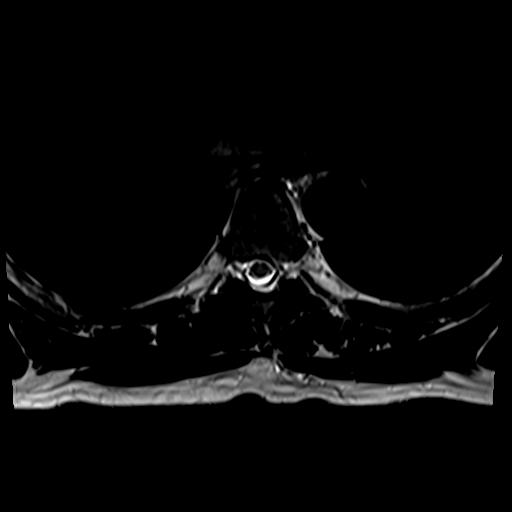
[im 6/18]
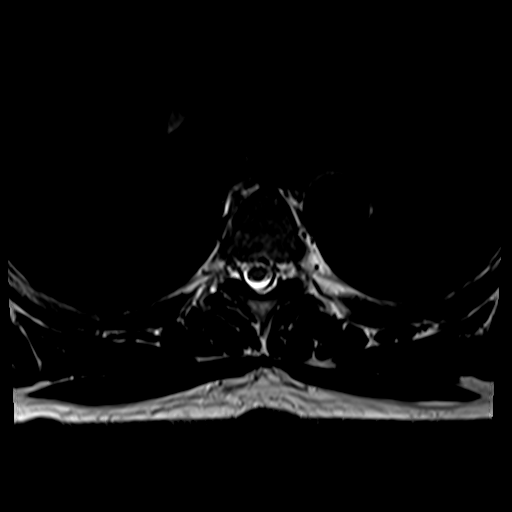
[im 9/18]
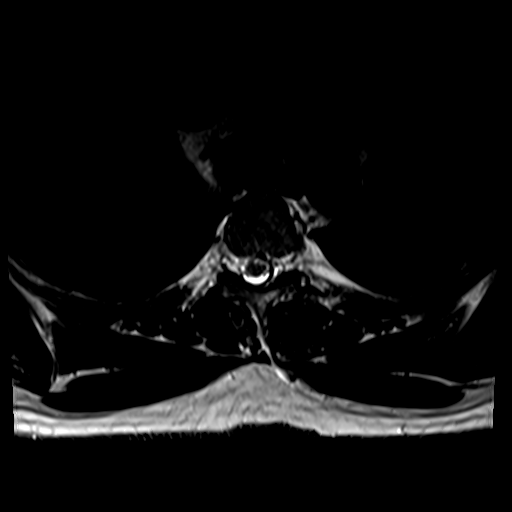
[im 12/18]
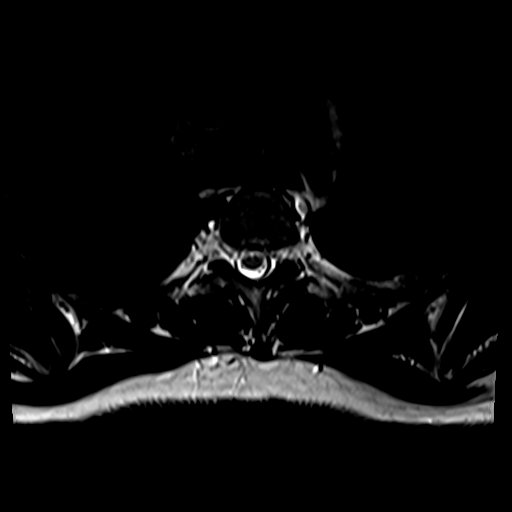
[im 15/18]
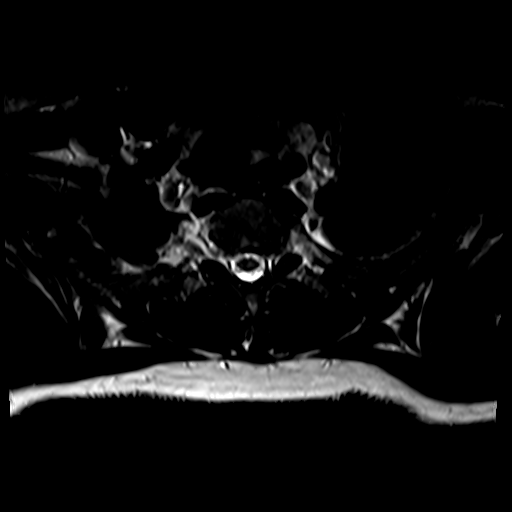
[im 18/18]
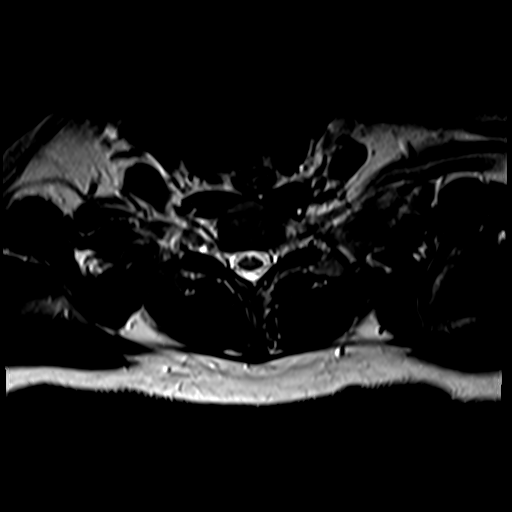

[Series 10: T2 · axial · 4.0mm · 0.39mm/px · z∈[-197,-83]mm · 8 of 21 slices shown (3 of 3)]
[im 1/21]
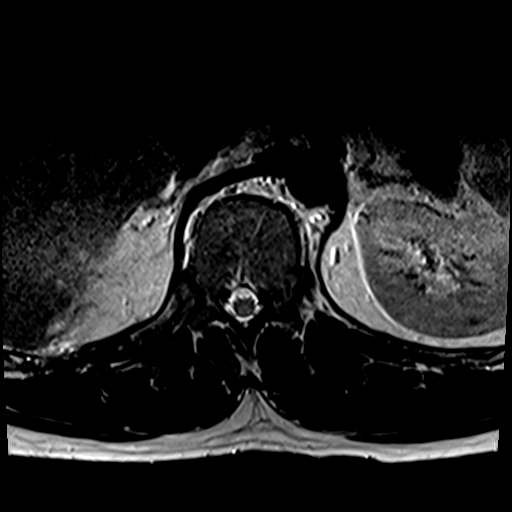
[im 3/21]
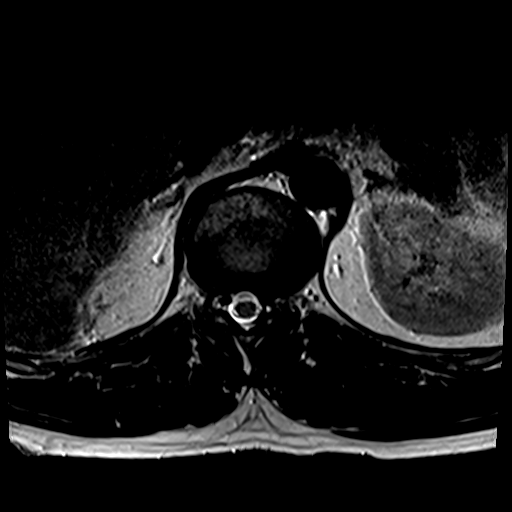
[im 6/21]
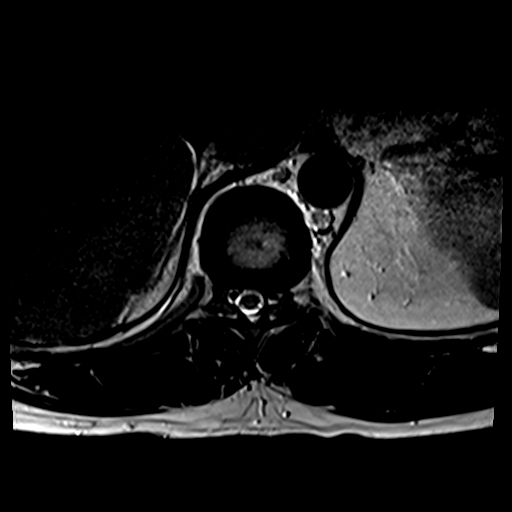
[im 9/21]
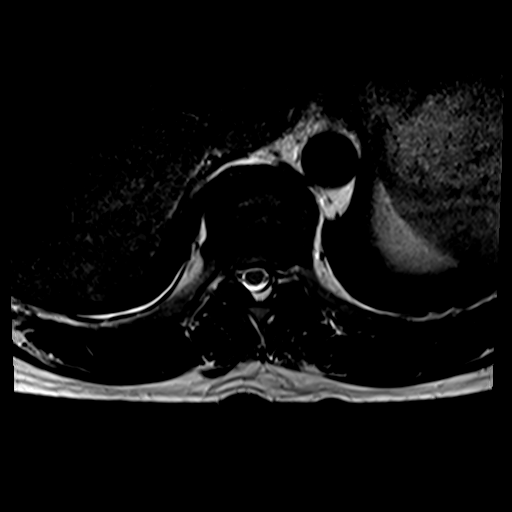
[im 12/21]
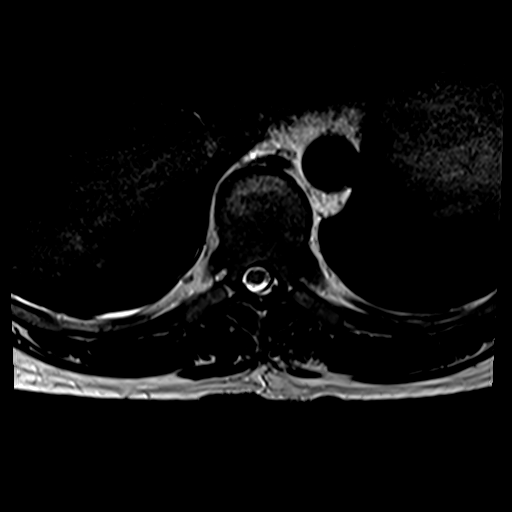
[im 15/21]
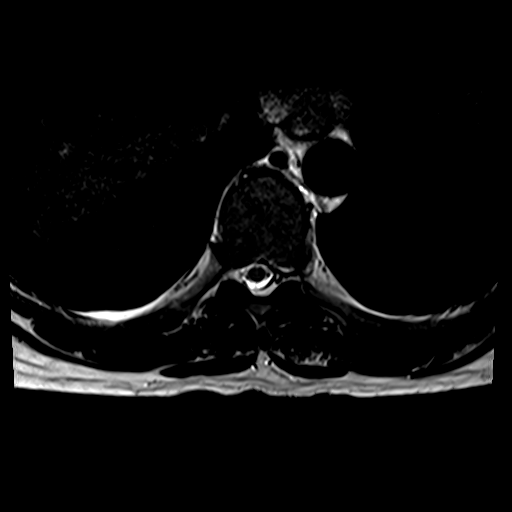
[im 18/21]
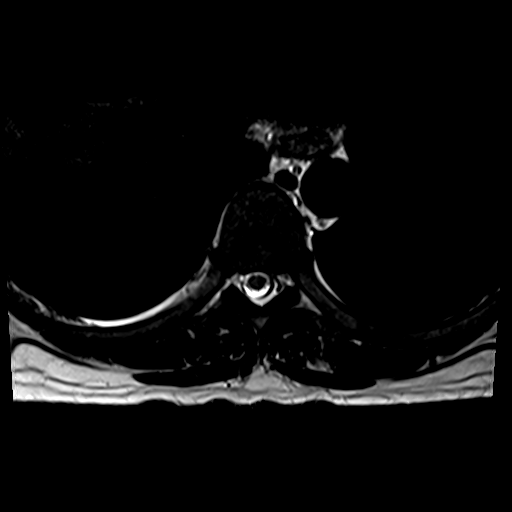
[im 21/21]
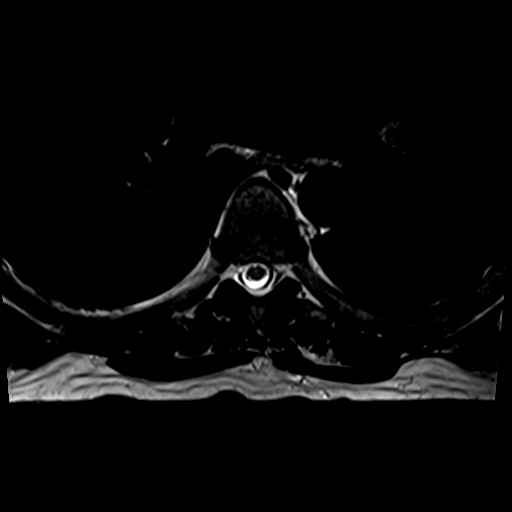

[24 of 48 positions shown; findings below may reference images not displayed]

FINDINGS: Alignment:  Physiologic.

Vertebrae: There is mild anterior wedging at T9, likely chronic. The
other vertebral bodies are normal.

Cord:  Normal

Paraspinal and other soft tissues: Unremarkable

Disc levels:

No spinal canal stenosis.
IMPRESSION: Mild anterior wedging of T9, likely chronic. Otherwise normal
thoracic spine.

## 2021-03-28 DIAGNOSIS — M5416 Radiculopathy, lumbar region: Secondary | ICD-10-CM | POA: Diagnosis not present

## 2021-03-28 DIAGNOSIS — S22070D Wedge compression fracture of T9-T10 vertebra, subsequent encounter for fracture with routine healing: Secondary | ICD-10-CM | POA: Diagnosis not present

## 2021-05-07 ENCOUNTER — Ambulatory Visit (INDEPENDENT_AMBULATORY_CARE_PROVIDER_SITE_OTHER): Payer: BC Managed Care – PPO | Admitting: Nurse Practitioner

## 2021-05-07 ENCOUNTER — Encounter: Payer: Self-pay | Admitting: Nurse Practitioner

## 2021-05-07 ENCOUNTER — Other Ambulatory Visit (INDEPENDENT_AMBULATORY_CARE_PROVIDER_SITE_OTHER): Payer: BC Managed Care – PPO

## 2021-05-07 VITALS — BP 110/74 | HR 85 | Ht 69.0 in | Wt 204.0 lb

## 2021-05-07 DIAGNOSIS — R112 Nausea with vomiting, unspecified: Secondary | ICD-10-CM

## 2021-05-07 DIAGNOSIS — R7989 Other specified abnormal findings of blood chemistry: Secondary | ICD-10-CM

## 2021-05-07 DIAGNOSIS — R1013 Epigastric pain: Secondary | ICD-10-CM

## 2021-05-07 DIAGNOSIS — Z800 Family history of malignant neoplasm of digestive organs: Secondary | ICD-10-CM

## 2021-05-07 DIAGNOSIS — R1012 Left upper quadrant pain: Secondary | ICD-10-CM

## 2021-05-07 LAB — CBC WITH DIFFERENTIAL/PLATELET
Basophils Absolute: 0.1 10*3/uL (ref 0.0–0.1)
Basophils Relative: 2.1 % (ref 0.0–3.0)
Eosinophils Absolute: 0.2 10*3/uL (ref 0.0–0.7)
Eosinophils Relative: 4.3 % (ref 0.0–5.0)
HCT: 42.8 % (ref 39.0–52.0)
Hemoglobin: 14.2 g/dL (ref 13.0–17.0)
Lymphocytes Relative: 25.7 % (ref 12.0–46.0)
Lymphs Abs: 1.2 10*3/uL (ref 0.7–4.0)
MCHC: 33.3 g/dL (ref 30.0–36.0)
MCV: 102.4 fl — ABNORMAL HIGH (ref 78.0–100.0)
Monocytes Absolute: 0.2 10*3/uL (ref 0.1–1.0)
Monocytes Relative: 5 % (ref 3.0–12.0)
Neutro Abs: 3 10*3/uL (ref 1.4–7.7)
Neutrophils Relative %: 62.9 % (ref 43.0–77.0)
Platelets: 142 10*3/uL — ABNORMAL LOW (ref 150.0–400.0)
RBC: 4.18 Mil/uL — ABNORMAL LOW (ref 4.22–5.81)
RDW: 15.1 % (ref 11.5–15.5)
WBC: 4.8 10*3/uL (ref 4.0–10.5)

## 2021-05-07 LAB — HEPATIC FUNCTION PANEL
ALT: 17 U/L (ref 0–53)
AST: 113 U/L — ABNORMAL HIGH (ref 0–37)
Albumin: 2.8 g/dL — ABNORMAL LOW (ref 3.5–5.2)
Alkaline Phosphatase: 197 U/L — ABNORMAL HIGH (ref 39–117)
Bilirubin, Direct: 1.5 mg/dL — ABNORMAL HIGH (ref 0.0–0.3)
Total Bilirubin: 3.1 mg/dL — ABNORMAL HIGH (ref 0.2–1.2)
Total Protein: 8 g/dL (ref 6.0–8.3)

## 2021-05-07 LAB — BASIC METABOLIC PANEL
BUN: 3 mg/dL — ABNORMAL LOW (ref 6–23)
CO2: 26 mEq/L (ref 19–32)
Calcium: 8.4 mg/dL (ref 8.4–10.5)
Chloride: 102 mEq/L (ref 96–112)
Creatinine, Ser: 0.74 mg/dL (ref 0.40–1.50)
GFR: 106.05 mL/min (ref >60.00)
Glucose, Bld: 104 mg/dL — ABNORMAL HIGH (ref 70–99)
Potassium: 3.3 mEq/L — ABNORMAL LOW (ref 3.5–5.1)
Sodium: 138 mEq/L (ref 135–145)

## 2021-05-07 LAB — PROTIME-INR
INR: 1.6 ratio — ABNORMAL HIGH (ref 0.8–1.0)
Prothrombin Time: 16.8 s — ABNORMAL HIGH (ref 9.6–13.1)

## 2021-05-07 LAB — LIPASE: Lipase: 42 U/L (ref 11.0–59.0)

## 2021-05-07 LAB — GAMMA GT: GGT: 286 U/L — ABNORMAL HIGH (ref 7–51)

## 2021-05-07 MED ORDER — OMEPRAZOLE 20 MG PO CPDR: 20.0000 mg | DELAYED_RELEASE_CAPSULE | Freq: Every day | ORAL | 1 refills | Status: DC

## 2021-05-07 NOTE — Progress Notes (Signed)
RADIOLOGY SCHEDULING REQUEST FOR MRI/MRCP sent to [facility] Scheduling via secure staff message. ?Reminder set to ensure appointment gets scheduled. ? ? ? ?

## 2021-05-07 NOTE — Patient Instructions (Signed)
You will be contacted by [facility] (Your caller ID will indicate phone # [phone]) in the next 7 days to schedule your MRI. If you have not heard from them within 7 business days, please call [facility] at [phone] to follow up on the status of your appointment.   ? ?Recommend no alcohol use. ?Take Omeprazole 20 MG once a day. This has been sent to the pharmacy. ? ?Please proceed to the basement level for lab work before leaving today. Press "B" on the elevator. The lab is located at the first door on the left as you exit the elevator. ? ?HEALTHCARE LAWS AND MY CHART RESULTS:  ? ?Due to recent changes in healthcare laws, you may see results of your imaging and/or laboratory studies on MyChart before I have had a chance to review them.  I understand that in some cases there may be results that are confusing or concerning to you. Please understand that not all results are received at the same time and often I may need to interpret multiple results in order to provide you with the best plan of care or course of treatment. Therefore, I ask that you please give me 48 hours to thoroughly review all your results before contacting my office for clarification.  ? ?Thank you for trusting me with your gastrointestinal care!   ? ?Noralyn Pick, CRNP ? ?Gastroesophageal Reflux Disease, Adult ?Gastroesophageal reflux (GER) happens when acid from the stomach flows up into the tube that connects the mouth and the stomach (esophagus). Normally, food travels down the esophagus and stays in the stomach to be digested. With GER, food and stomach acid sometimes move back up into the esophagus. ?You may have a disease called gastroesophageal reflux disease (GERD) if the reflux: ?Happens often. ?Causes frequent or very bad symptoms. ?Causes problems such as damage to the esophagus. ?When this happens, the esophagus becomes sore and swollen. Over time, GERD can make small holes  (ulcers) in the lining of the esophagus. ?What are the causes? ?This condition is caused by a problem with the muscle between the esophagus and the stomach. When this muscle is weak or not normal, it does not close properly to keep food and acid from coming back up from the stomach. ?The muscle can be weak because of: ?Tobacco use. ?Pregnancy. ?Having a certain type of hernia (hiatal hernia). ?Alcohol use. ?Certain foods and drinks, such as coffee, chocolate, onions, and peppermint. ?What increases the risk? ?Being overweight. ?Having a disease that affects your connective tissue. ?Taking NSAIDs, such a ibuprofen. ?What are the signs or symptoms? ?Heartburn. ?Difficult or painful swallowing. ?The feeling of having a lump in the throat. ?A bitter taste in the mouth. ?Bad breath. ?Having a lot of saliva. ?Having an upset or bloated stomach. ?Burping. ?Chest pain. Different conditions can cause chest pain. Make sure you see your doctor if you have chest pain. ?Shortness of breath or wheezing. ?A long-term cough or a cough at night. ?Wearing away of the surface of teeth (tooth enamel). ?Weight loss. ?How is this treated? ?Making changes to your diet. ?Taking medicine. ?Having surgery. ?Treatment will depend on how bad your symptoms are. ?Follow these instructions at home: ?Eating and drinking ? ?Follow a diet as told by your doctor. You may need to avoid foods and drinks such as: ?Coffee and tea, with or without caffeine. ?Drinks that contain alcohol. ?Energy drinks and sports drinks. ?Bubbly (carbonated) drinks or sodas. ?Chocolate and cocoa. ?Peppermint  and mint flavorings. ?Garlic and onions. ?Horseradish. ?Spicy and acidic foods. These include peppers, chili powder, curry powder, vinegar, hot sauces, and BBQ sauce. ?Citrus fruit juices and citrus fruits, such as oranges, lemons, and limes. ?Tomato-based foods. These include red sauce, chili, salsa, and pizza with red sauce. ?Fried and fatty foods. These include  donuts, french fries, potato chips, and high-fat dressings. ?High-fat meats. These include hot dogs, rib eye steak, sausage, ham, and bacon. ?High-fat dairy items, such as whole milk, butter, and cream cheese. ?Eat small meals often. Avoid eating large meals. ?Avoid drinking large amounts of liquid with your meals. ?Avoid eating meals during the 2-3 hours before bedtime. ?Avoid lying down right after you eat. ?Do not exercise right after you eat. ?Lifestyle ? ?Do not smoke or use any products that contain nicotine or tobacco. If you need help quitting, ask your doctor. ?Try to lower your stress. If you need help doing this, ask your doctor. ?If you are overweight, lose an amount of weight that is healthy for you. Ask your doctor about a safe weight loss goal. ?General instructions ?Pay attention to any changes in your symptoms. ?Take over-the-counter and prescription medicines only as told by your doctor. ?Do not take aspirin, ibuprofen, or other NSAIDs unless your doctor says it is okay. ?Wear loose clothes. Do not wear anything tight around your waist. ?Raise (elevate) the head of your bed about 6 inches (15 cm). You may need to use a wedge to do this. ?Avoid bending over if this makes your symptoms worse. ?Keep all follow-up visits. ?Contact a doctor if: ?You have new symptoms. ?You lose weight and you do not know why. ?You have trouble swallowing or it hurts to swallow. ?You have wheezing or a cough that keeps happening. ?You have a hoarse voice. ?Your symptoms do not get better with treatment. ?Get help right away if: ?You have sudden pain in your arms, neck, jaw, teeth, or back. ?You suddenly feel sweaty, dizzy, or light-headed. ?You have chest pain or shortness of breath. ?You vomit and the vomit is green, yellow, or black, or it looks like blood or coffee grounds. ?You faint. ?Your poop (stool) is red, bloody, or black. ?You cannot swallow, drink, or eat. ?These symptoms may represent a serious problem that  is an emergency. Do not wait to see if the symptoms will go away. Get medical help right away. Call your local emergency services (911 in the [place] Do not drive yourself to the hospital. ?Summary ?If a person has gastroesophageal reflux disease (GERD), food and stomach acid move back up into the esophagus and cause symptoms or problems such as damage to the esophagus. ?Treatment will depend on how bad your symptoms are. ?Follow a diet as told by your doctor. ?Take all medicines only as told by your doctor. ?This information is not intended to replace advice given to you by your health care provider. Make sure you discuss any questions you have with your health care provider. ?Document Revised: 08/23/2019 Document Reviewed: 08/23/2019 ?Elsevier Patient Education ? [place]. ? ? ?BMI: ? ?If you are age 55 or older, your body mass index should be between 23-30. Your Body mass index is 30.13 kg/m?Marland Kitchen If this is out of the aforementioned range listed, please consider follow up with your Primary Care Provider. ? ?If you are age 50 or younger, your body mass index should be between 19-25. Your Body mass index is 30.13 kg/m?Marland Kitchen If this is out of the aformentioned range  listed, please consider follow up with your Primary Care Provider.  ? ?MY CHART: ? ?The [facility] providers would like to encourage you to use [facility] to communicate with providers for non-urgent requests or questions.  Due to long hold times on the telephone, sending your provider a message by [facility] may be a faster and more efficient way to get a response.  Please allow 48 business hours for a response.  Please remember that this is for non-urgent requests.  ? ? ?

## 2021-05-07 NOTE — Progress Notes (Signed)
+.     [name] [id number] 1970/05/31   CHIEF COMPLAINT: Fatty liver, upper abdominal pain  HISTORY OF PRESENT ILLNESS:  Trevor Reilly is a 51 year old male with a past medical history of depression, sarcoidosis, hypertension, RBBB, GERD, alcohol use disorder elevated LFTs and fatty liver. He presents to our office today as referred by Trevor Peer NP for further evaluation regarding cirrhosis. He went to the ED 11/17/202 due to having N/V and diarrhea. Labs in the ED showed an elevated T. Bili level of 4.7 (up from 1.4), Alk phos 218, AST 160 and a normal ALT level of 27. RUQ sono identified coarsened liver echogenicity with nodular contour suggestive of chronic liver disease/hepatic cirrhosis and reversal of flow in the portal vein was suggestive of portal hypertension.  No evidence of ascites.  The gallbladder was normal.  He denies having any issues with confusion, pruritus or jaundice.  He complains of having epigastric pain described as sharp and occurs randomly several days weekly for the past month.  Eating does not trigger his epigastric pain.  He has nausea and has vomited 2 or 3 times once weekly.  Emesis is yellow-green bilious with a few specks of bright red blood.  No coffee-ground emesis.  He experiences heartburn if he drinks beer on an empty stomach or if he eats spicy foods.  No dysphagia.  He typically passes a formed brown bowel movement most days.  However, over the past year he intermittently passes a loose black stool.  No Pepto-Bismol or oral iron use.  No bright red rectal bleeding.  He reported taking Nexium for a few months when he was prescribed a pain medication for his shoulder around 06/2020.  He denies ever having an EGD or screening colonoscopy.  Maternal grandmother was diagnosed with colon cancer in her 47s mother had hepatitis secondary to a blood transfusion in the 1980s, it is unclear if she had hepatitis  B or C.  No known family history of liver cancer.  History of sarcoidosis, previously on steroids x 10+ years, off x 4 years. He was recently seen by pulmonologist Dr. Silas Reilly, at that time he was asymptomatic with a stable chest xray therefore steroid therapy was not recommended.  There was no evidence of hepatic granulomas per RUQ sonogram 12/2020 and CTAP 08/2019.  He previously drank a bottle of liquor which would last 2 to 3 days for 3-4 consecutive years in the early 2000's then switched to drinking mostly beer.  He drinks two 22 ounce beers 5 days weekly and on Saturdays he drinks four to five  12 ounce beers.  No drug use.  CBC Latest Ref Rng & Units 01/11/2021 05/24/2020 04/13/2020  WBC 4.0 - 10.5 K/uL 6.4 - 6.6  Hemoglobin 13.0 - 17.0 g/dL 14.5 17.7(H) 14.7  Hematocrit 39.0 - 52.0 % 43.1 52.0 44.4  Platelets 150 - 400 K/uL 169 - 180     CMP Latest Ref Rng & Units 01/11/2021 05/24/2020 04/13/2020  Glucose 70 - 99 mg/dL 108(H) 112(H) 83  BUN 6 - 20 mg/dL 6 3(L) 5(L)  Creatinine 0.61 - 1.24 mg/dL 0.70 0.70 0.64  Sodium 135 - 145 mmol/L 133(L) 142 134(L)  Potassium 3.5 - 5.1 mmol/L 3.5 4.2 3.6  Chloride 98 - 111 mmol/L 101 102 101  CO2 22 - 32 mmol/L 23 - 23  Calcium 8.9 - 10.3 mg/dL 8.1(L) - 8.3(L)  Total Protein 6.5 - 8.1 g/dL 7.8 - 8.4(H)  Total Bilirubin 0.3 -  1.2 mg/dL 4.7(H) - 1.4(H)  Alkaline Phos 38 - 126 U/L 218(H) - 186(H)  AST 15 - 41 U/L 160(H) - 119(H)  ALT 0 - 44 U/L 27 - 21     RUQ sonogram 01/11/2021: ULTRASOUND ABDOMEN LIMITED RIGHT UPPER QUADRANT   COMPARISON:  CT 09/20/2019   FINDINGS: Gallbladder:   No gallstones or wall thickening visualized. No sonographic Murphy sign noted by sonographer.   Common bile duct:   Diameter: 4.6 mm   Liver:   Coarse liver echogenicity with nodular contours. Reversal of flow in the portal vein.   Other: None.   IMPRESSION: Coarse liver echogenicity with nodular contours suggestive of chronic liver disease/hepatic cirrhosis.   Reversal of flow in the portal vein  suggesting portal hypertension.  CTAP 09/20/2019: Chronic bronchial wall thickening likely related to the patient's underlying history of sarcoidosis.  Diffuse abdominal adenopathy also consistent with the given clinical history of sarcoidosis.  Nodular changes in the liver consistent with underlying cirrhosis.  Large left hydrocele   Past Medical History:  Diagnosis Date   Chronic cough    05-22-2020 per pt morning productive with yellow sputum   Depression    ED (erectile dysfunction)    Epididymal cyst    left   Fatty liver    05-22-2020  per pt has been referred to GI,  last ultrasound in epic 06-10-2016   GERD (gastroesophageal reflux disease)    History of community acquired pneumonia 04/13/2020   ED visit in epic   History of rectal bleeding    Hypertension    05-22-2020 followed by new pcp--- no medication currently   Mild obstructive sleep apnea    05-22-2020  per pt had study approx. 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   05-19-2020  currently started seeing new pcp,  previous seen by pulmonolgy , Trevor Miles PA([facility]) lov note 05-20-2016 [facility]   Sarcoidosis of skin    05-22-2020  per pt lower legs   Wears glasses    Past Surgical History:  Procedure Laterality Date   CYSTOSCOPY  05/24/2020   Procedure: CYSTOSCOPY FLEXIBLE;  Surgeon: Lucas Mallow, MD;  Location: [facility];  Service: Urology;;   EPIDIDYMECTOMY Left 05/24/2020   Procedure: LEFT EPIDIDYMAL CYST REMOVAL;  Surgeon: Lucas Mallow, MD;  Location: [facility];  Service: Urology;  Laterality: Left;   LUNG BIOPSY  2007   IR   CT guided needle lung biopsy's and washing's    Social History: He is single.  He is a Freight forwarder.  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 He drinks two 22 oz beers 5 day weekly and on Saturdays he drinks four to five 12 oz beers. No drug use.   Family History: Maternal grandmother diagnosed with colon cancer in her 51's. Brother with history of lung cancer.   No Known Allergies   Outpatient Encounter Medications as of 05/07/2021  Medication Sig   acetaminophen (TYLENOL 8 HOUR) 650 MG CR tablet Take 1 tablet (650 mg total) by mouth every 8 (eight) hours.   albuterol (VENTOLIN HFA) 108 (90 Base) MCG/ACT inhaler Inhale into the lungs as needed.   esomeprazole (NEXIUM) 40 MG capsule Take 40  mg by mouth daily.   fluticasone (FLONASE) 50 MCG/ACT nasal spray SMARTSIG:1-2 Spray(s) Both Nares Daily PRN   HYDROcodone-acetaminophen (NORCO/VICODIN) 5-325 MG tablet Take 1 tablet by mouth every 6 (six) hours as needed for severe pain.   ondansetron (ZOFRAN) 4 MG tablet Take 1 tablet (4 mg total) by mouth every 8 (eight) hours as needed for nausea or vomiting.   pantoprazole (PROTONIX) 20 MG tablet Take by mouth.   pantoprazole (PROTONIX) 40 MG tablet Take 1 tablet (40 mg total) by mouth daily. (Patient taking differently: Take 40 mg by mouth every evening.)   SYMBICORT 160-4.5 MCG/ACT inhaler SMARTSIG:2 Puff(s) By Mouth Morning-Evening   No facility-administered encounter medications on file as of 05/07/2021.   REVIEW OF SYSTEMS:  Gen: Denies fever, sweats or chills. No weight loss.  CV: Denies chest pain, palpitations or edema. Resp: Denies cough, shortness of breath of hemoptysis.  GI: See HPI.   GU : Denies urinary burning, blood in urine, increased urinary frequency or incontinence. MS: Denies joint pain, muscles aches or weakness. Derm: Denies rash, itchiness, skin lesions or unhealing ulcers. Psych: Denies depression, anxiety, memory loss, suicidal ideation and confusion. Heme: Denies bruising, easy bleeding. Neuro:  Denies headaches, dizziness or paresthesias. Endo:  Denies any problems with DM, thyroid or  adrenal function.  PHYSICAL EXAM: BP 110/74    Pulse 85    Ht 5' 9"  (1.753 m)    Wt 204 lb (92.5 kg)    SpO2 98%    BMI 30.13 kg/m   General: 51 year old male in no acute distress. Head: Normocephalic and atraumatic. Eyes:  Sclerae non-icteric, conjunctive pink. Ears: Normal auditory acuity. Mouth: Dentition intact. No ulcers or lesions.  Neck: Supple, no lymphadenopathy or thyromegaly.  Lungs: Clear bilaterally to auscultation without wheezes, crackles or rhonchi. Heart: Regular rate and rhythm. No murmur, rub or gallop appreciated.  Abdomen: Soft, nontender, non distended. No masses. No hepatosplenomegaly. Normoactive bowel sounds x 4 quadrants.  Rectal: Deferred Musculoskeletal: Symmetrical with no gross deformities. Skin: Warm and dry. No rash or lesions on visible extremities. Extremities: No edema. Neurological: Alert oriented x 4, no focal deficits.  Psychological:  Alert and cooperative. Normal mood and affect.  ASSESSMENT AND PLAN:  16) 51 year old male with elevated LFTs, fatty liver disease with possible cirrhosis. RUQ sono 12/2020 showed coarsened liver echogenicity with nodular contour suggestive of chronic liver disease/hepatic cirrhosis and reversal of flow in the portal vein was suggestive of portal hypertension.  No evidence of ascites or hepatic granulomas.  Labs 01/11/2021 with elevated total bilirubin level and AST/ALT ratio consistent with alcohol use disorder.  Total bili 4.7.  Alk phos 218.  AST 160.  ALT 27.  Albumin 2.7.  Normal platelet count 169. -Hepatitis A total antibody, Hepatitis B surface antigen, Hepatitis B core total antibody, Hepatitis C antibody, IgG, ANA, SMA, AMA, ceruloplasmin, GGT, A1AT, BMP, hepatic panel, BMP, CBC and INR -Abdominal MRI/MRCP with and without contrast -If abdominal MRI further confirms cirrhosis he will require liver imaging and AFP every 6 months -He will need Hep A and Hep B vaccinations if not immune  -Patient counseled to  wean off alcohol  2) Pulmonary sarcoidosis, stable per recent evaluation by pulmonologist Dr. Silas Reilly  3) Colon cancer screening. Mother with history of colon cancer. -Colonoscopy benefits and risks discussed including risk with sedation, risk of bleeding, perforation and infection   4) Epigastric pain, N/V x 4 weeks.  Bilious emesis with specks of bright red blood.  Infrequent  heartburn. -EGD to assess for GERD/Barrett's esophagus and esophageal/gastric varices  -GERD/ulcer handout -Omeprazole 20 mg daily  Further recommendations to be determined after the above evaluation completed.    CC:  Delford Field, FNP

## 2021-05-11 LAB — HEPATITIS A ANTIBODY, TOTAL: Hepatitis A AB,Total: REACTIVE — AB

## 2021-05-11 LAB — HEPATITIS C ANTIBODY
Hepatitis C Ab: NONREACTIVE
SIGNAL TO CUT-OFF: 0.48 (ref <1.00)

## 2021-05-11 LAB — HEPATITIS B SURFACE ANTIGEN: Hepatitis B Surface Ag: NONREACTIVE

## 2021-05-11 LAB — ANTI-NUCLEAR AB-TITER (ANA TITER): ANA Titer 1: 1:80 {titer} — ABNORMAL HIGH

## 2021-05-11 LAB — ALPHA-1-ANTITRYPSIN: A-1 Antitrypsin, Ser: 83 mg/dL (ref 83–199)

## 2021-05-11 LAB — CERULOPLASMIN: Ceruloplasmin: 29 mg/dL (ref 18–36)

## 2021-05-11 LAB — IGG: IgG (Immunoglobin G), Serum: 2600 mg/dL — ABNORMAL HIGH (ref 600–1640)

## 2021-05-11 LAB — HEPATITIS B SURFACE ANTIBODY,QUALITATIVE: Hep B S Ab: REACTIVE — AB

## 2021-05-11 LAB — MITOCHONDRIAL ANTIBODIES: Mitochondrial M2 Ab, IgG: <=20 U (ref <=20.0)

## 2021-05-11 LAB — HEPATITIS B CORE ANTIBODY, TOTAL: Hep B Core Total Ab: NONREACTIVE

## 2021-05-11 LAB — ANTI-SMOOTH MUSCLE ANTIBODY, IGG: Actin (Smooth Muscle) Antibody (IGG): <20 U (ref <20)

## 2021-05-11 LAB — ANA: Anti Nuclear Antibody (ANA): POSITIVE — AB

## 2021-05-14 ENCOUNTER — Other Ambulatory Visit (INDEPENDENT_AMBULATORY_CARE_PROVIDER_SITE_OTHER): Payer: BC Managed Care – PPO

## 2021-05-14 ENCOUNTER — Other Ambulatory Visit: Payer: Self-pay

## 2021-05-14 ENCOUNTER — Telehealth: Payer: Self-pay | Admitting: Nurse Practitioner

## 2021-05-14 DIAGNOSIS — R791 Abnormal coagulation profile: Secondary | ICD-10-CM

## 2021-05-14 LAB — PROTIME-INR
INR: 1.6 ratio — ABNORMAL HIGH (ref 0.8–1.0)
Prothrombin Time: 17.3 s — ABNORMAL HIGH (ref 9.6–13.1)

## 2021-05-14 NOTE — Telephone Encounter (Signed)
Inbound call from patient stating that he is going to come do his labs today instead of tomorrow. Patient stated that he is in need of a doctors note for work. Please advise.  ?

## 2021-05-14 NOTE — Telephone Encounter (Signed)
Pt came to the office requesting a note for work for the labs that he had to have done this morning. Please send it via MyChart. ?

## 2021-05-15 NOTE — Telephone Encounter (Signed)
Please give the work note ?RG ?

## 2021-05-15 NOTE — Progress Notes (Signed)
Excellent plan. ?Liver cirrhosis likely d/t sarcoidosis/ETOH/both.  ?Can we add ACE level? ?May eventually need liver Bx depending upon the MRI findings ?Proceed with egd/colon ? ? ?RG ?

## 2021-05-16 ENCOUNTER — Encounter: Payer: Self-pay | Admitting: Gastroenterology

## 2021-05-16 NOTE — Progress Notes (Signed)
I will order ACE level with next lab order.  ?

## 2021-05-16 NOTE — Telephone Encounter (Signed)
Work note sent per Dr. Steve Rattler orders and pt request. ?

## 2021-05-23 ENCOUNTER — Encounter: Payer: Self-pay | Admitting: Gastroenterology

## 2021-05-23 DIAGNOSIS — E559 Vitamin D deficiency, unspecified: Secondary | ICD-10-CM | POA: Diagnosis not present

## 2021-05-23 DIAGNOSIS — K7460 Unspecified cirrhosis of liver: Secondary | ICD-10-CM | POA: Diagnosis not present

## 2021-05-23 DIAGNOSIS — R7989 Other specified abnormal findings of blood chemistry: Secondary | ICD-10-CM | POA: Diagnosis not present

## 2021-05-23 DIAGNOSIS — R791 Abnormal coagulation profile: Secondary | ICD-10-CM | POA: Diagnosis not present

## 2021-05-26 ENCOUNTER — Other Ambulatory Visit: Payer: Self-pay | Admitting: Nurse Practitioner

## 2021-05-26 ENCOUNTER — Ambulatory Visit (HOSPITAL_COMMUNITY)
Admission: RE | Admit: 2021-05-26 | Discharge: 2021-05-26 | Disposition: A | Discharge status: Home / Self Care | Payer: BC Managed Care – PPO | Source: Ambulatory Visit | Attending: Nurse Practitioner | Admitting: Nurse Practitioner

## 2021-05-26 DIAGNOSIS — K7460 Unspecified cirrhosis of liver: Secondary | ICD-10-CM | POA: Diagnosis not present

## 2021-05-26 DIAGNOSIS — D1803 Hemangioma of intra-abdominal structures: Secondary | ICD-10-CM | POA: Diagnosis not present

## 2021-05-26 DIAGNOSIS — R7989 Other specified abnormal findings of blood chemistry: Secondary | ICD-10-CM | POA: Diagnosis not present

## 2021-05-26 DIAGNOSIS — R112 Nausea with vomiting, unspecified: Secondary | ICD-10-CM | POA: Diagnosis not present

## 2021-05-26 DIAGNOSIS — R1012 Left upper quadrant pain: Secondary | ICD-10-CM | POA: Diagnosis not present

## 2021-05-26 DIAGNOSIS — R1013 Epigastric pain: Secondary | ICD-10-CM | POA: Diagnosis not present

## 2021-05-26 DIAGNOSIS — Z800 Family history of malignant neoplasm of digestive organs: Secondary | ICD-10-CM

## 2021-05-26 IMAGING — MR MR ABDOMEN WO/W CM MRCP
19 of 23 series · 40 of 48 positions shown · IV contrast (gadavist)
Comparison: No priors.

CLINICAL DATA: 51-year-old male with history of left upper quadrant
and epigastric pain. Elevated liver function tests. Nausea and
vomiting.

EXAM:
MRI ABDOMEN WITHOUT AND WITH CONTRAST (INCLUDING MRCP)
TECHNIQUE: Multiplanar multisequence MR imaging of the abdomen was performed
both before and after the administration of intravenous contrast.
Heavily T2-weighted images of the biliary and pancreatic ducts were
obtained, and three-dimensional MRCP images were rendered by post
processing.
CONTRAST:  9mL GADAVIST GADOBUTROL 1 MMOL/ML IV SOLN

[Series 3: T2 fat-sat · axial · 6.0mm · 1.25mm/px · 1 of 42 slices shown]
[im 1/42]
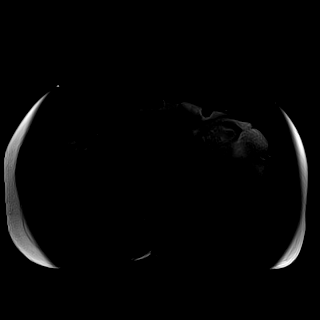

[Series 6: T2 · coronal · 6.0mm · 1.56mm/px · 1 of 30 slices shown (1 of 2)]
[im 1/30]
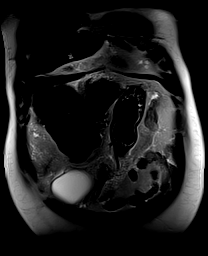

[Series 7: DWI · axial · 6.0mm · 1.49mm/px · z∈[-81,+214]mm · 2 of 84 slices shown (1 of 2)]
[im 1/84]
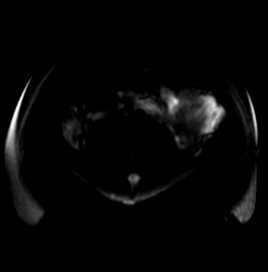
[im 84/84]
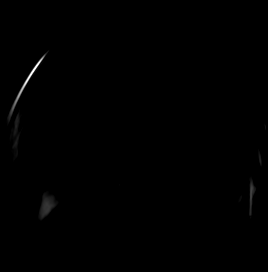

[Series 8: DWI · axial · 6.0mm · 1.49mm/px · 1 of 42 slices shown (2 of 2)]
[im 1/42]
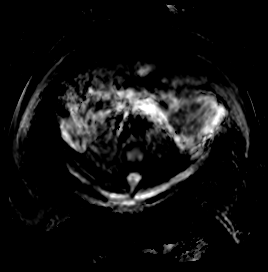

[Series 9: T1 · axial · 3.0mm · 1.25mm/px · z∈[-72,+213]mm · 3 of 96 slices shown (1 of 2)]
[im 1/96]
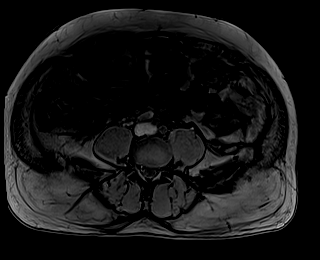
[im 48/96]
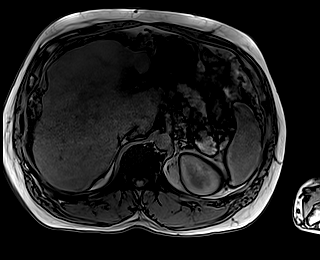
[im 96/96]
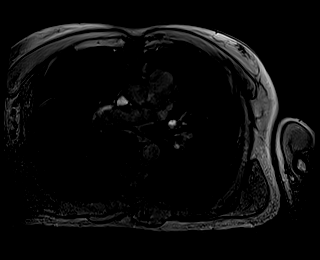

[Series 10: T1 · axial · 3.0mm · 1.25mm/px · z∈[-72,+213]mm · 3 of 96 slices shown (2 of 2)]
[im 1/96]
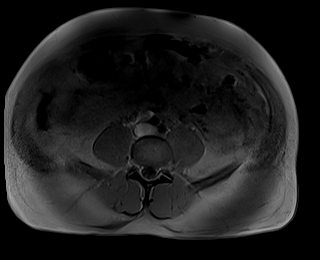
[im 48/96]
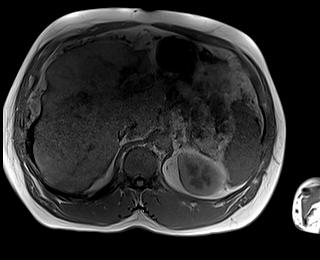
[im 96/96]
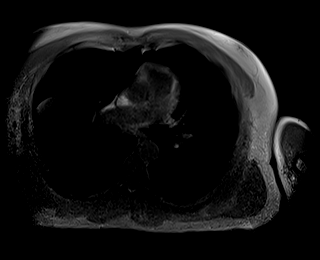

[Series 11: cor obl thk · sagittal · 50.0mm · 0.78mm/px · 1 of 9 slices shown]
[im 1/9]
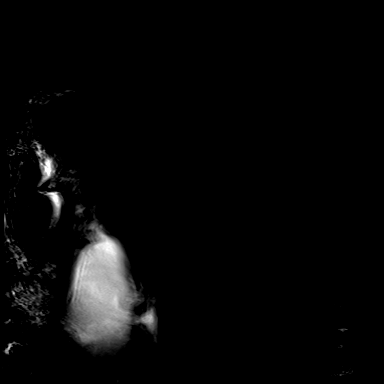

[Series 12: cor_3d_spc_trig-resp · 1 of 10 slices shown]
[im 1/10]
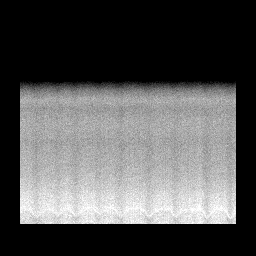

[Series 13: cor_3d_spc_trig · coronal · 1.0mm · 0.49mm/px · 2 of 72 slices shown]
[im 1/72]
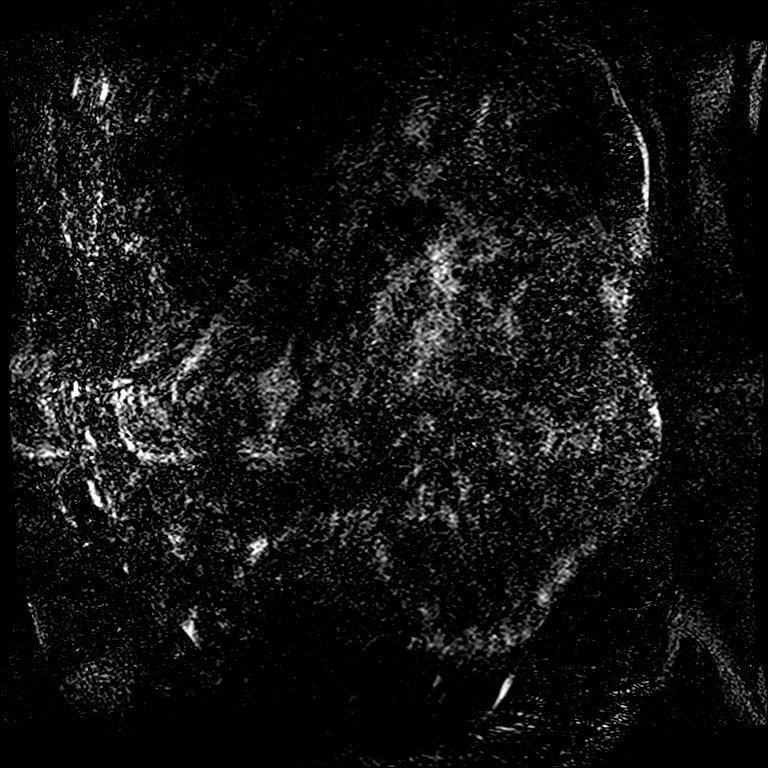
[im 72/72]
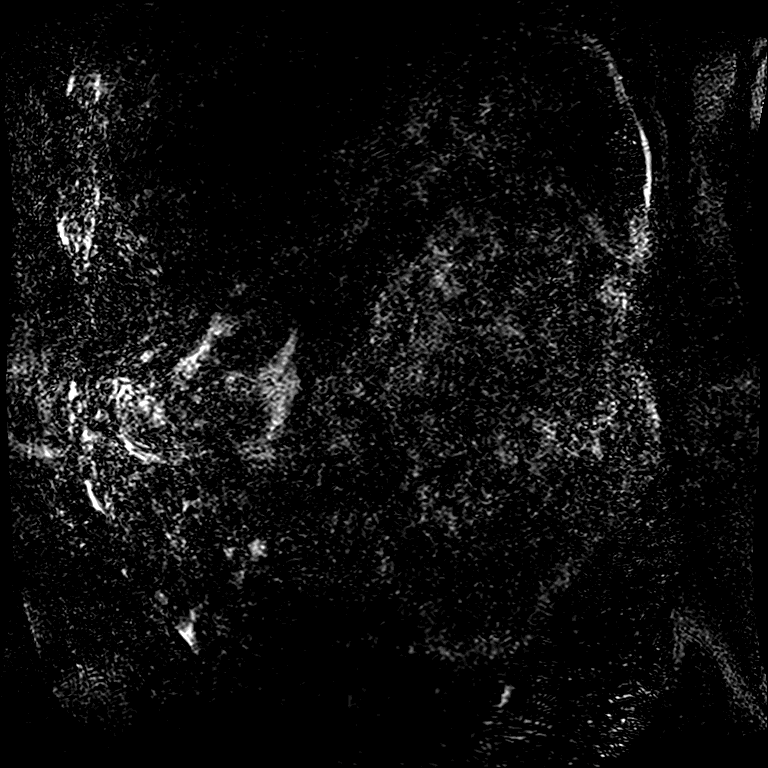

[Series 15: T2 · axial · 6.0mm · 1.56mm/px · 1 of 40 slices shown (2 of 2)]
[im 1/40]
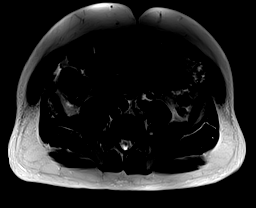

[Series 17: T1 dynamic · axial · 3.0mm · 1.25mm/px · z∈[-79,+182]mm · 3 of 88 slices shown (1 of 9)]
[im 1/88]
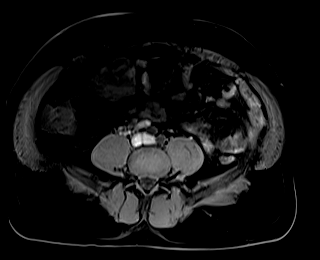
[im 44/88]
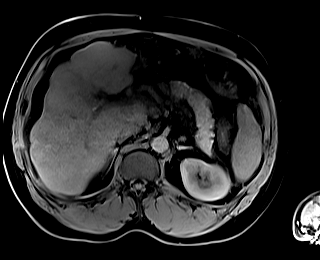
[im 88/88]
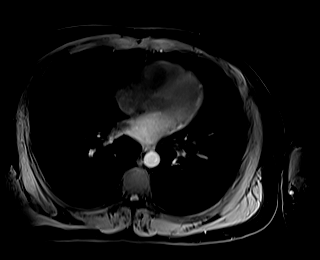

[Series 22: T1 dynamic · axial · 3.0mm · 1.25mm/px · z∈[-79,+182]mm · 3 of 88 slices shown (2 of 9)]
[im 1/88]
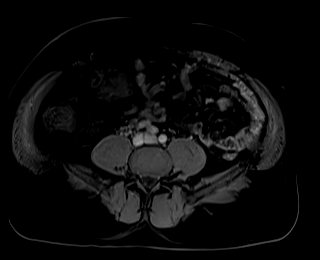
[im 44/88]
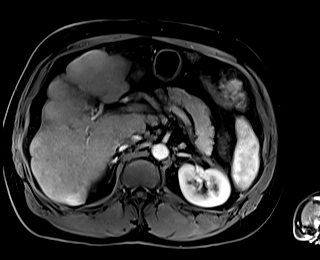
[im 88/88]
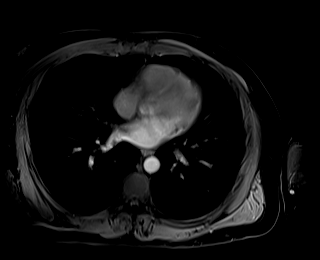

[Series 23: T1 dynamic · axial · 3.0mm · 1.25mm/px · z∈[-79,+182]mm · 3 of 88 slices shown (3 of 9)]
[im 1/88]
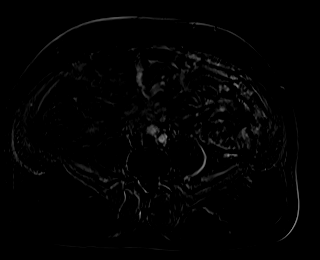
[im 44/88]
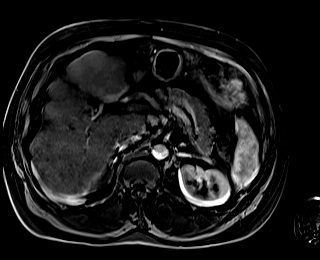
[im 88/88]
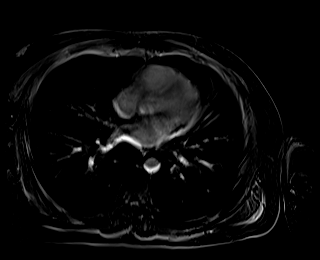

[Series 26: T1 dynamic · axial · 3.0mm · 1.25mm/px · z∈[-79,+182]mm · 3 of 88 slices shown (4 of 9)]
[im 1/88]
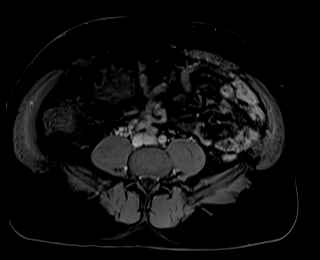
[im 44/88]
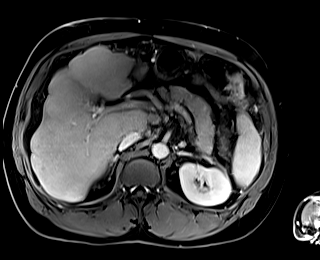
[im 88/88]
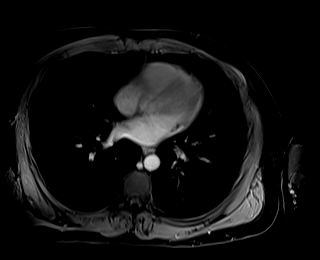

[Series 27: T1 dynamic · axial · 3.0mm · 1.25mm/px · z∈[-79,+182]mm · 3 of 88 slices shown (5 of 9)]
[im 1/88]
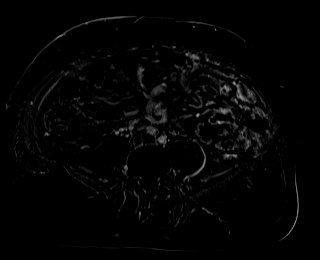
[im 44/88]
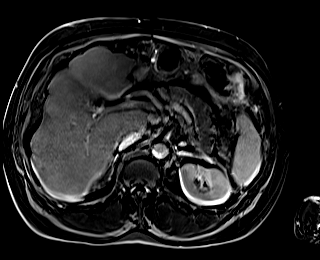
[im 88/88]
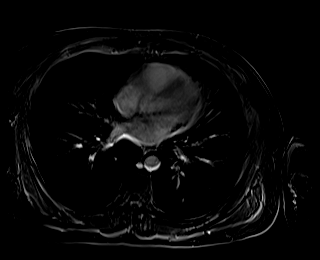

[Series 30: T1 dynamic · axial · 3.0mm · 1.25mm/px · z∈[-79,+182]mm · 3 of 88 slices shown (6 of 9)]
[im 1/88]
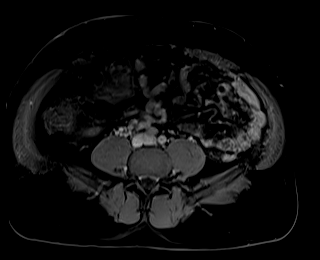
[im 44/88]
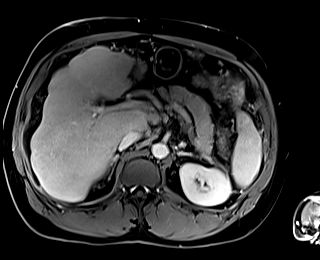
[im 88/88]
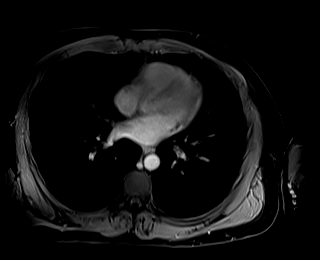

[Series 31: T1 dynamic · axial · 3.0mm · 1.25mm/px · z∈[-79,+182]mm · 3 of 88 slices shown (7 of 9)]
[im 1/88]
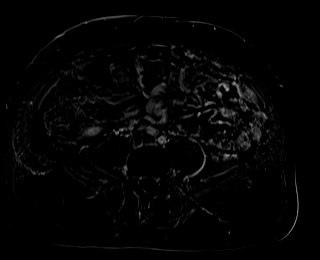
[im 44/88]
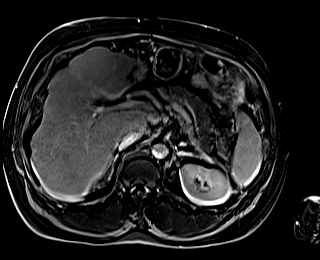
[im 88/88]
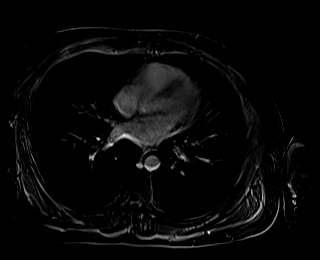

[Series 33: T1 dynamic · coronal · 3.0mm · 1.41mm/px · 2 of 72 slices shown (8 of 9)]
[im 1/72]
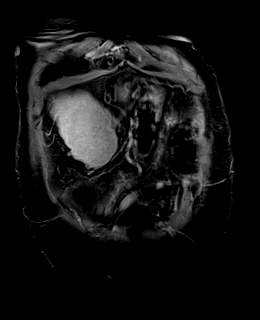
[im 72/72]
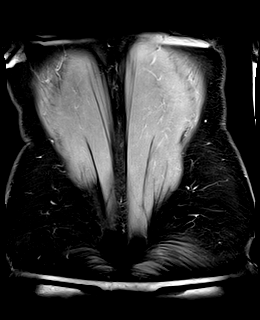

[Series 36: T1 dynamic · axial · 3.0mm · 1.25mm/px · 1 of 88 slices shown (9 of 9)]
[im 1/88]
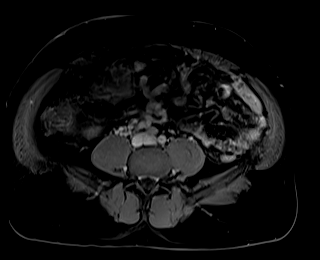

[40 of 48 positions shown; findings below may reference images not displayed]

FINDINGS: Lower chest: Unremarkable.

Hepatobiliary: Mild diffuse loss of signal intensity throughout the
hepatic parenchyma on out of phase dual echo images, indicative of a
background of hepatic steatosis. Liver has a shrunken appearance and
severely nodular contour, indicative of advanced cirrhosis. In the
periphery of the right lobe of the liver (segment 5) there is a
subtle lesion that is mildly T1 hypointense and T2 hyperintense
(axial image 22 of series 15) measuring 1.0 x 0.8 cm which
demonstrates restricted diffusion, and demonstrates some peripheral
nodular hyperenhancement with some progressive filling on delayed
images, diagnostic of a small cavernous hemangioma. No other
suspicious appearing hepatic lesions are noted. Several hypovascular
nodules are noted throughout the liver, presumably regenerative
nodules. No intra or extrahepatic biliary ductal dilatation.
Gallbladder is moderately distended. No filling defects within the
gallbladder to suggest cholelithiasis. Gallbladder wall does not
appear thickened. Common bile duct is normal in caliber measuring 6
mm in the porta hepatis. No filling defects in the common bile duct
to suggest choledocholithiasis. No pericholecystic fluid or
surrounding inflammatory changes.

Pancreas: No pancreatic mass. No pancreatic ductal dilatation. No
pancreatic or peripancreatic fluid collections or inflammatory
changes.

Spleen:  Unremarkable.

Adrenals/Urinary Tract: Bilateral kidneys and bilateral adrenal
glands are normal in appearance. No hydroureteronephrosis in the
visualized portions of the abdomen.

Stomach/Bowel: Visualized portions are unremarkable.

Vascular/Lymphatic: No aneurysm identified in the visualized
abdominal vasculature. Portal vein is patent measuring 9 mm in the
porta hepatis. No lymphadenopathy noted in the abdomen or pelvis.
Multiple prominent borderline enlarged upper abdominal lymph nodes
are noted, nonspecific in the setting of intrinsic liver disease.

Other:  Trace volume of ascites.

Musculoskeletal: No aggressive appearing osseous lesions are noted
in the visualized portions of the skeleton.
IMPRESSION: 1. Hepatic steatosis with evidence of advanced hepatic cirrhosis.
2. No choledocholithiasis or findings to suggest biliary tract
obstruction.
3. Small cavernous hemangioma noted in the right lobe of the liver,
as above.
4. Small volume of ascites.
5. Additional incidental findings, as above.

## 2021-05-26 MED ORDER — GADOBUTROL 1 MMOL/ML IV SOLN
9.0000 mL | Freq: Once | INTRAVENOUS | Status: AC | PRN
  Administered 2021-05-26: 9 mL via INTRAVENOUS

## 2021-05-28 ENCOUNTER — Encounter: Payer: Self-pay | Admitting: Gastroenterology

## 2021-05-28 ENCOUNTER — Other Ambulatory Visit: Payer: Self-pay

## 2021-05-28 ENCOUNTER — Telehealth: Payer: Self-pay | Admitting: Nurse Practitioner

## 2021-05-28 ENCOUNTER — Telehealth: Payer: Self-pay | Admitting: Gastroenterology

## 2021-05-28 DIAGNOSIS — R791 Abnormal coagulation profile: Secondary | ICD-10-CM

## 2021-05-28 DIAGNOSIS — D869 Sarcoidosis, unspecified: Secondary | ICD-10-CM

## 2021-05-28 NOTE — Telephone Encounter (Signed)
Trevor Reilly, (nursing staff covering for Trevor Reilly). Pls contact patient and send him to the lab today for a stat PT/INR so we have the results back prior to his EGD and colonoscopy scheduled with Dr. Lyndel Safe on 05/29/2021.  ? ?Pls include ACE level to lab order (not stat).  DX: Elevated LFTs. Sarcoidosis. THX.  ? ?Dr. Lyndel Safe, [facility] Rainier. Refer to lab notes. Prior INR 1.6.  ?

## 2021-05-28 NOTE — Telephone Encounter (Signed)
Patient called to cancel EGD/Colonoscopy for tomorrow 06/13/21. Per patient, death in the family was the reason for cancellation. Patient rescheduled 07/13/21.   ?

## 2021-05-28 NOTE — Telephone Encounter (Signed)
Pt notified of request to have  Stat labs drawn today due to the pt being scheduled for  procedure tomorrow: Pt stated that his procedure is not tomorrow and he had previously been rescheduled  for 07/13/2021 at 2:30 PM with Dr. Lyndel Safe: ? ?

## 2021-05-28 NOTE — Telephone Encounter (Signed)
Pt made aware of the request for labs to be Drawn: Orders for labs placed in Epic: ?Pt made aware to come have labs drawn  when he does have a chance but this does not have to be done STAT (today) : Pt stated that he will come tomorrow and have labs drawn: ?Pt verbalized understanding with all questions answered.  ? ?

## 2021-05-29 ENCOUNTER — Encounter: Payer: BC Managed Care – PPO | Admitting: Gastroenterology

## 2021-05-30 ENCOUNTER — Other Ambulatory Visit: Payer: Self-pay

## 2021-05-30 DIAGNOSIS — K766 Portal hypertension: Secondary | ICD-10-CM

## 2021-05-30 DIAGNOSIS — K769 Liver disease, unspecified: Secondary | ICD-10-CM

## 2021-06-15 ENCOUNTER — Encounter (HOSPITAL_BASED_OUTPATIENT_CLINIC_OR_DEPARTMENT_OTHER): Payer: Self-pay

## 2021-06-15 ENCOUNTER — Other Ambulatory Visit: Payer: Self-pay

## 2021-06-15 ENCOUNTER — Emergency Department (HOSPITAL_BASED_OUTPATIENT_CLINIC_OR_DEPARTMENT_OTHER)
Admission: EM | Admit: 2021-06-15 | Discharge: 2021-06-15 | Disposition: A | Discharge status: Home / Self Care | Payer: BC Managed Care – PPO | Attending: Emergency Medicine | Admitting: Emergency Medicine

## 2021-06-15 DIAGNOSIS — K760 Fatty (change of) liver, not elsewhere classified: Secondary | ICD-10-CM | POA: Diagnosis not present

## 2021-06-15 DIAGNOSIS — R197 Diarrhea, unspecified: Secondary | ICD-10-CM | POA: Diagnosis not present

## 2021-06-15 DIAGNOSIS — R112 Nausea with vomiting, unspecified: Secondary | ICD-10-CM

## 2021-06-15 DIAGNOSIS — K7460 Unspecified cirrhosis of liver: Secondary | ICD-10-CM | POA: Diagnosis not present

## 2021-06-15 DIAGNOSIS — R1084 Generalized abdominal pain: Secondary | ICD-10-CM | POA: Diagnosis not present

## 2021-06-15 LAB — COMPREHENSIVE METABOLIC PANEL
ALT: 24 U/L (ref 0–44)
AST: 139 U/L — ABNORMAL HIGH (ref 15–41)
Albumin: 1.9 g/dL — ABNORMAL LOW (ref 3.5–5.0)
Alkaline Phosphatase: 175 U/L — ABNORMAL HIGH (ref 38–126)
Anion gap: 6 (ref 5–15)
BUN: <5 mg/dL — ABNORMAL LOW (ref 6–20)
CO2: 25 mmol/L (ref 22–32)
Calcium: 7.9 mg/dL — ABNORMAL LOW (ref 8.9–10.3)
Chloride: 106 mmol/L (ref 98–111)
Creatinine, Ser: 0.69 mg/dL (ref 0.61–1.24)
GFR, Estimated: >60 mL/min (ref >60)
Glucose, Bld: 97 mg/dL (ref 70–99)
Potassium: 3.4 mmol/L — ABNORMAL LOW (ref 3.5–5.1)
Sodium: 137 mmol/L (ref 135–145)
Total Bilirubin: 7.6 mg/dL — ABNORMAL HIGH (ref 0.3–1.2)
Total Protein: 7.6 g/dL (ref 6.5–8.1)

## 2021-06-15 LAB — CBC WITH DIFFERENTIAL/PLATELET
Abs Immature Granulocytes: 0.02 10*3/uL (ref 0.00–0.07)
Basophils Absolute: 0.2 10*3/uL — ABNORMAL HIGH (ref 0.0–0.1)
Basophils Relative: 2 %
Eosinophils Absolute: 0.3 10*3/uL (ref 0.0–0.5)
Eosinophils Relative: 4 %
HCT: 36.3 % — ABNORMAL LOW (ref 39.0–52.0)
Hemoglobin: 12.6 g/dL — ABNORMAL LOW (ref 13.0–17.0)
Immature Granulocytes: 0 %
Lymphocytes Relative: 20 %
Lymphs Abs: 1.4 10*3/uL (ref 0.7–4.0)
MCH: 34 pg (ref 26.0–34.0)
MCHC: 34.7 g/dL (ref 30.0–36.0)
MCV: 97.8 fL (ref 80.0–100.0)
Monocytes Absolute: 0.5 10*3/uL (ref 0.1–1.0)
Monocytes Relative: 7 %
Neutro Abs: 4.6 10*3/uL (ref 1.7–7.7)
Neutrophils Relative %: 67 %
Platelets: 126 10*3/uL — ABNORMAL LOW (ref 150–400)
RBC: 3.71 MIL/uL — ABNORMAL LOW (ref 4.22–5.81)
RDW: 17.7 % — ABNORMAL HIGH (ref 11.5–15.5)
WBC: 7 10*3/uL (ref 4.0–10.5)
nRBC: 0 % (ref 0.0–0.2)

## 2021-06-15 LAB — LIPASE, BLOOD: Lipase: 44 U/L (ref 11–51)

## 2021-06-15 MED ORDER — ONDANSETRON HCL 4 MG/2ML IJ SOLN
4.0000 mg | Freq: Once | INTRAMUSCULAR | Status: AC
  Administered 2021-06-15: 4 mg via INTRAVENOUS
  Filled 2021-06-15: qty 2

## 2021-06-15 MED ORDER — MAALOX MAX 400-400-40 MG/5ML PO SUSP: 10.0000 mL | Freq: Four times a day (QID) | ORAL | 0 refills | Status: DC | PRN

## 2021-06-15 MED ORDER — ONDANSETRON 4 MG PO TBDP: 4.0000 mg | ORAL_TABLET | Freq: Three times a day (TID) | ORAL | 0 refills | Status: DC | PRN

## 2021-06-15 MED ORDER — DICYCLOMINE HCL 20 MG PO TABS
20.0000 mg | ORAL_TABLET | Freq: Three times a day (TID) | ORAL | 0 refills | Status: DC | PRN
Start: 2021-06-15 — End: 2021-09-28

## 2021-06-15 MED ORDER — KETOROLAC TROMETHAMINE 15 MG/ML IJ SOLN
15.0000 mg | Freq: Once | INTRAMUSCULAR | Status: AC
Start: 2021-06-15 — End: 2021-06-15
  Administered 2021-06-15: 15 mg via INTRAVENOUS
  Filled 2021-06-15: qty 1

## 2021-06-15 MED ORDER — ALUM & MAG HYDROXIDE-SIMETH 200-200-20 MG/5ML PO SUSP
15.0000 mL | Freq: Once | ORAL | Status: AC
Start: 2021-06-15 — End: 2021-06-15
  Administered 2021-06-15: 15 mL via ORAL
  Filled 2021-06-15: qty 30

## 2021-06-15 MED ORDER — DICYCLOMINE HCL 10 MG PO CAPS
10.0000 mg | ORAL_CAPSULE | Freq: Once | ORAL | Status: AC
Start: 2021-06-15 — End: 2021-06-15
  Administered 2021-06-15: 10 mg via ORAL
  Filled 2021-06-15: qty 1

## 2021-06-15 MED ORDER — SODIUM CHLORIDE 0.9 % IV BOLUS
1000.0000 mL | Freq: Once | INTRAVENOUS | Status: AC
  Administered 2021-06-15: 1000 mL via INTRAVENOUS

## 2021-06-15 NOTE — Discharge Instructions (Signed)
You were seen in the ED today with abdominal pain and vomiting. You should keep your follow up with your PCP and GI doctors coming up. I have called in some medication to help with symptoms. Please stick to mainly liquids today and bland foods until your symptoms improve.  ?

## 2021-06-15 NOTE — ED Provider Notes (Signed)
?[facility] ?Provider Note ? ? ?CSN: [id number] ?Arrival date & time: 06/15/21  0532 ? ?  ? ?History ? ?Chief Complaint  ?Patient presents with  ? Emesis  ? ? ?Trevor Reilly is a 51 y.o. male. ? ?Pt is a 51 yo male presenting for n/v. Pt states to generalized abdominal pain, nonradiating, intermittent, associated with nausea and vomiting that started yesterday morning. Patient states the night before he had meatballs, linguine noodles, and italiain sausage from [employer] that he left in his car for several hours in the 80 degree weather prior to eating. Denies chest pain. Epigastric pain on exam. Drinks two beers nightly.  ? ?The history is provided by the patient. No language interpreter was used.  ?Emesis ?Associated symptoms: abdominal pain   ?Associated symptoms: no arthralgias, no chills, no cough, no fever and no sore throat   ? ?  ? ?Home Medications ?Prior to Admission medications   ?Medication Sig Start Date End Date Taking? Authorizing Provider  ?acetaminophen (TYLENOL 8 HOUR) 650 MG CR tablet Take 1 tablet (650 mg total) by mouth every 8 (eight) hours. ?Patient taking differently: Take 650 mg by mouth every 8 (eight) hours as needed. 04/13/20   Varney Biles, MD  ?albuterol (VENTOLIN HFA) 108 (90 Base) MCG/ACT inhaler Inhale into the lungs as needed. 07/18/15   [provider]  ?fluticasone Asencion Islam) 50 MCG/ACT nasal spray SMARTSIG:1-2 Spray(s) Both Nares Daily PRN 12/17/20   [provider]  ?omeprazole (PRILOSEC) 20 MG capsule Take 1 capsule (20 mg total) by mouth daily. 05/07/21   Noralyn Pick, NP  ?ondansetron (ZOFRAN) 4 MG tablet Take 1 tablet (4 mg total) by mouth every 8 (eight) hours as needed for nausea or vomiting. 01/11/21   Horton, Alvin Critchley, DO  ?   ? ?Allergies    ?Patient has no known allergies.   ? ?Review of Systems   ?Review of Systems  ?Constitutional:  Negative for chills and fever.  ?HENT:  Negative for ear pain and sore  throat.   ?Eyes:  Negative for pain and visual disturbance.  ?Respiratory:  Negative for cough and shortness of breath.   ?Cardiovascular:  Negative for chest pain and palpitations.  ?Gastrointestinal:  Positive for abdominal pain, nausea and vomiting.  ?Genitourinary:  Negative for dysuria and hematuria.  ?Musculoskeletal:  Negative for arthralgias and back pain.  ?Skin:  Negative for color change and rash.  ?Neurological:  Negative for seizures and syncope.  ?All other systems reviewed and are negative. ? ?Physical Exam ?Updated Vital Signs ?BP (!) 146/99   Pulse 94   Temp 98.9 ?F (37.2 ?C)   Resp 19   Ht '5\' 9"'$  (1.753 m)   Wt 92.5 kg   SpO2 100%   BMI 30.11 kg/m?  ?Physical Exam ?Vitals and nursing note reviewed.  ?Constitutional:   ?   General: He is not in acute distress. ?   Appearance: He is well-developed.  ?HENT:  ?   Head: Normocephalic and atraumatic.  ?Eyes:  ?   Conjunctiva/sclera: Conjunctivae normal.  ?Cardiovascular:  ?   Rate and Rhythm: Normal rate and regular rhythm.  ?   Heart sounds: No murmur heard. ?Pulmonary:  ?   Effort: Pulmonary effort is normal. No respiratory distress.  ?   Breath sounds: Normal breath sounds.  ?Abdominal:  ?   Palpations: Abdomen is soft.  ?   Tenderness: There is no abdominal tenderness.  ?Musculoskeletal:     ?  General: No swelling.  ?   Cervical back: Neck supple.  ?Skin: ?   General: Skin is warm and dry.  ?   Capillary Refill: Capillary refill takes less than 2 seconds.  ?Neurological:  ?   Mental Status: He is alert.  ?Psychiatric:     ?   Mood and Affect: Mood normal.  ? ? ?ED Results / Procedures / Treatments   ?Labs ?(all labs ordered are listed, but only abnormal results are displayed) ?Labs Reviewed  ?CBC WITH DIFFERENTIAL/PLATELET  ?COMPREHENSIVE METABOLIC PANEL  ?LIPASE, BLOOD  ? ? ?EKG ?None ? ?Radiology ?No results found. ? ?Procedures ?Procedures  ? ? ?Medications Ordered in ED ?Medications  ?ondansetron (ZOFRAN) injection 4 mg (has no  administration in time range)  ? ? ?ED Course/ Medical Decision Making/ A&P ?  ?                        ?Medical Decision Making ?Amount and/or Complexity of Data Reviewed ?Labs: ordered. ? ?Risk ?Prescription drug management. ? ? ?6:06 AM ? 51 yo male presenting for epigastric abdominal pain, nausea, vomiting after leaving food in hot car and eating.  Is alert and oriented x3, no acute distress, afebrile, stable vital signs.  Abdomen is soft with minimal tenderness in epigastric region.  ? ?IV fluids, Zofran, Toradol given for symptomatic management.  ? ?Laboratory studies demonstrate AST 139 with ALT 24.  Consistent with previous studies on 05/07/2021.  Total bilirubin elevated at 7.6.  Recent 3.1.  Chart review demonstrates MRCP on 05/26/2021 demonstrating no choledocholithiasis or biliary duct obstruction. ? ?Signed out to oncoming physician Dr. Laverta Baltimore while awaiting imaging studies. ? ? ? ? ? ? ? ? ?Final Clinical Impression(s) / ED Diagnoses ?Final diagnoses:  ?None  ? ? ?Rx / DC Orders ?ED Discharge Orders   ? ? None  ? ?  ? ? ?  ?Lianne Cure, DO ?63/14/97 [id number] ? ?

## 2021-06-15 NOTE — ED Provider Notes (Signed)
Blood pressure 124/75, pulse 97, temperature 98.9 ?F (37.2 ?C), resp. rate 18, height '5\' 9"'$  (1.753 m), weight 92.5 kg, SpO2 100 %. ? ?Assuming care from Dr. Pearline Cables.  In short, Trevor Reilly is a 51 y.o. male with a chief complaint of Emesis ?Marland Kitchen  Refer to the original H&P for additional details. ? ?The current plan of care is to follow up on symptom mgmt. ? ?07:10AM ?Patient with bilirubin of 7.6. Noted to have history of elevated LFTs and bilirubin with MRCP performed 3 weeks prior for this indication. No biliary pathology. Hepatic steatosis and cirrhosis noted. Will hold on additional biliary imaging in this setting and follow clinically.  ? ?07:42 AM  ?Patient feeling improved. Plan for symptom mgmt meds at home and PCP/GI follow up which he has scheduled.  ? ?  ?Margette Fast, MD ?06/15/21 [phone] ? ?

## 2021-06-15 NOTE — ED Triage Notes (Signed)
Ambulatory to ED with c/o abd pain and vomiting x 2 days. State he ate [place] that had been sitting out all day before symptoms started.  ?

## 2021-06-29 ENCOUNTER — Ambulatory Visit (AMBULATORY_SURGERY_CENTER): Payer: Self-pay | Admitting: *Deleted

## 2021-06-29 VITALS — Ht 69.0 in | Wt 210.0 lb

## 2021-06-29 DIAGNOSIS — Z8719 Personal history of other diseases of the digestive system: Secondary | ICD-10-CM

## 2021-06-29 DIAGNOSIS — Z1211 Encounter for screening for malignant neoplasm of colon: Secondary | ICD-10-CM

## 2021-06-29 MED ORDER — NA SULFATE-K SULFATE-MG SULF 17.5-3.13-1.6 GM/177ML PO SOLN: 1.0000 | Freq: Once | ORAL | 0 refills | Status: AC

## 2021-06-29 NOTE — Progress Notes (Signed)

## 2021-07-08 ENCOUNTER — Encounter: Payer: Self-pay | Admitting: Certified Registered Nurse Anesthetist

## 2021-07-13 ENCOUNTER — Ambulatory Visit (AMBULATORY_SURGERY_CENTER): Payer: BC Managed Care – PPO | Admitting: Gastroenterology

## 2021-07-13 ENCOUNTER — Other Ambulatory Visit (INDEPENDENT_AMBULATORY_CARE_PROVIDER_SITE_OTHER): Payer: BC Managed Care – PPO

## 2021-07-13 ENCOUNTER — Encounter: Payer: Self-pay | Admitting: Gastroenterology

## 2021-07-13 ENCOUNTER — Telehealth: Payer: Self-pay | Admitting: Gastroenterology

## 2021-07-13 VITALS — BP 113/71 | HR 86 | Temp 98.0°F | Resp 16 | Ht 69.0 in | Wt 210.0 lb

## 2021-07-13 DIAGNOSIS — Z8719 Personal history of other diseases of the digestive system: Secondary | ICD-10-CM

## 2021-07-13 DIAGNOSIS — K766 Portal hypertension: Secondary | ICD-10-CM

## 2021-07-13 DIAGNOSIS — K7030 Alcoholic cirrhosis of liver without ascites: Secondary | ICD-10-CM

## 2021-07-13 DIAGNOSIS — K2970 Gastritis, unspecified, without bleeding: Secondary | ICD-10-CM | POA: Diagnosis not present

## 2021-07-13 DIAGNOSIS — D124 Benign neoplasm of descending colon: Secondary | ICD-10-CM

## 2021-07-13 DIAGNOSIS — D128 Benign neoplasm of rectum: Secondary | ICD-10-CM

## 2021-07-13 DIAGNOSIS — K621 Rectal polyp: Secondary | ICD-10-CM

## 2021-07-13 DIAGNOSIS — Z129 Encounter for screening for malignant neoplasm, site unspecified: Secondary | ICD-10-CM | POA: Diagnosis not present

## 2021-07-13 DIAGNOSIS — D122 Benign neoplasm of ascending colon: Secondary | ICD-10-CM | POA: Diagnosis not present

## 2021-07-13 DIAGNOSIS — Z1211 Encounter for screening for malignant neoplasm of colon: Secondary | ICD-10-CM

## 2021-07-13 DIAGNOSIS — K2950 Unspecified chronic gastritis without bleeding: Secondary | ICD-10-CM | POA: Diagnosis not present

## 2021-07-13 DIAGNOSIS — K449 Diaphragmatic hernia without obstruction or gangrene: Secondary | ICD-10-CM

## 2021-07-13 LAB — CBC WITH DIFFERENTIAL/PLATELET
Basophils Absolute: 0 10*3/uL (ref 0.0–0.1)
Basophils Relative: 0.6 % (ref 0.0–3.0)
Eosinophils Absolute: 0.3 10*3/uL (ref 0.0–0.7)
Eosinophils Relative: 5.8 % — ABNORMAL HIGH (ref 0.0–5.0)
HCT: 36 % — ABNORMAL LOW (ref 39.0–52.0)
Hemoglobin: 12.2 g/dL — ABNORMAL LOW (ref 13.0–17.0)
Lymphocytes Relative: 23.5 % (ref 12.0–46.0)
Lymphs Abs: 1.1 10*3/uL (ref 0.7–4.0)
MCHC: 33.8 g/dL (ref 30.0–36.0)
MCV: 103.1 fl — ABNORMAL HIGH (ref 78.0–100.0)
Monocytes Absolute: 0.3 10*3/uL (ref 0.1–1.0)
Monocytes Relative: 6.8 % (ref 3.0–12.0)
Neutro Abs: 3.1 10*3/uL (ref 1.4–7.7)
Neutrophils Relative %: 63.3 % (ref 43.0–77.0)
Platelets: 107 10*3/uL — ABNORMAL LOW (ref 150.0–400.0)
RBC: 3.5 Mil/uL — ABNORMAL LOW (ref 4.22–5.81)
RDW: 14.8 % (ref 11.5–15.5)
WBC: 4.9 10*3/uL (ref 4.0–10.5)

## 2021-07-13 LAB — COMPREHENSIVE METABOLIC PANEL
ALT: 19 U/L (ref 0–53)
AST: 87 U/L — ABNORMAL HIGH (ref 0–37)
Albumin: 2 g/dL — ABNORMAL LOW (ref 3.5–5.2)
Alkaline Phosphatase: 152 U/L — ABNORMAL HIGH (ref 39–117)
BUN: 4 mg/dL — ABNORMAL LOW (ref 6–23)
CO2: 27 mEq/L (ref 19–32)
Calcium: 7.6 mg/dL — ABNORMAL LOW (ref 8.4–10.5)
Chloride: 102 mEq/L (ref 96–112)
Creatinine, Ser: 0.81 mg/dL (ref 0.40–1.50)
GFR: 103.06 mL/min (ref >60.00)
Glucose, Bld: 100 mg/dL — ABNORMAL HIGH (ref 70–99)
Potassium: 3.3 mEq/L — ABNORMAL LOW (ref 3.5–5.1)
Sodium: 135 mEq/L (ref 135–145)
Total Bilirubin: 6.1 mg/dL — ABNORMAL HIGH (ref 0.2–1.2)
Total Protein: 7.5 g/dL (ref 6.0–8.3)

## 2021-07-13 LAB — PROTIME-INR
INR: 2.1 ratio — ABNORMAL HIGH (ref 0.8–1.0)
Prothrombin Time: 22.2 s — ABNORMAL HIGH (ref 9.6–13.1)

## 2021-07-13 MED ORDER — SODIUM CHLORIDE 0.9 % IV SOLN: 500.0000 mL | Freq: Once | INTRAVENOUS | Status: DC

## 2021-07-13 NOTE — Progress Notes (Unsigned)
Called to room to assist during endoscopic procedure.  Patient ID and intended procedure confirmed with present staff. Received instructions for my participation in the procedure from the performing physician.  

## 2021-07-13 NOTE — Patient Instructions (Signed)
Information on polyps and gastritis given to you today.  STOP ALL ALCOHOL!  Must continue Protonix 40 mg by mouth once a day.  Follow up with Colleen in GI clinic in 4-6 weeks.  Eat a low salt diet.  Avoid NSAIDS (Aspirin, Ibuprofen, Aleve, Naproxen), you may use Tylenol as needed.   YOU HAD AN ENDOSCOPIC PROCEDURE TODAY AT [facility]:   Refer to the procedure report that was given to you for any specific questions about what was found during the examination.  If the procedure report does not answer your questions, please call your gastroenterologist to clarify.  If you requested that your care partner not be given the details of your procedure findings, then the procedure report has been included in a sealed envelope for you to review at your convenience later.  YOU SHOULD EXPECT: Some feelings of bloating in the abdomen. Passage of more gas than usual.  Walking can help get rid of the air that was put into your GI tract during the procedure and reduce the bloating. If you had a lower endoscopy (such as a colonoscopy or flexible sigmoidoscopy) you may notice spotting of blood in your stool or on the toilet paper. If you underwent a bowel prep for your procedure, you may not have a normal bowel movement for a few days.  Please Note:  You might notice some irritation and congestion in your nose or some drainage.  This is from the oxygen used during your procedure.  There is no need for concern and it should clear up in a day or so.  SYMPTOMS TO REPORT IMMEDIATELY:  Following lower endoscopy (colonoscopy or flexible sigmoidoscopy):  Excessive amounts of blood in the stool  Significant tenderness or worsening of abdominal pains  Swelling of the abdomen that is new, acute  Fever of 100F or higher  Following upper endoscopy (EGD)  Vomiting of blood or coffee ground material  New chest pain or pain under the shoulder blades  Painful or persistently difficult  swallowing  New shortness of breath  Fever of 100F or higher  Black, tarry-looking stools  For urgent or emergent issues, a gastroenterologist can be reached at any hour by calling [phone]. Do not use MyChart messaging for urgent concerns.    DIET:  We do recommend a small meal at first, but then you may proceed to your regular diet.  Drink plenty of fluids but you should avoid alcoholic beverages for 24 hours.  ACTIVITY:  You should plan to take it easy for the rest of today and you should NOT DRIVE or use heavy machinery until tomorrow (because of the sedation medicines used during the test).    FOLLOW UP: Our staff will call the number listed on your records 48-72 hours following your procedure to check on you and address any questions or concerns that you may have regarding the information given to you following your procedure. If we do not reach you, we will leave a message.  We will attempt to reach you two times.  During this call, we will ask if you have developed any symptoms of COVID 19. If you develop any symptoms (ie: fever, flu-like symptoms, shortness of breath, cough etc.) before then, please call [phone].  If you test positive for Covid 19 in the 2 weeks post procedure, please call and report this information to [place].    If any biopsies were taken you will be contacted by phone or by letter within the next  1-3 weeks.  Please call us at [phone] if you have not heard about the biopsies in 3 weeks.    SIGNATURES/CONFIDENTIALITY: You and/or your care partner have signed paperwork which will be entered into your electronic medical record.  These signatures attest to the fact that that the information above on your After Visit Summary has been reviewed and is understood.  Full responsibility of the confidentiality of this discharge information lies with you and/or your care-partner.

## 2021-07-13 NOTE — Telephone Encounter (Signed)
Spoke with patient and he stated that after drinking the first half of the suprep he vomited a little while after completing it. He has not drank the second half of the suprep, and is wondering if he could drink miralax instead. Reported that last bowel movement was liquid, and clear "like water". Spoke with MD, and advised patient to get '119mg'$  of miralax and mix with 32oz of gatorade. Advised pt to drink 8oz every 15 min to avoid vomiting. Pt verbalized understanding and had no further concerns at the end of the call. Gave pt [facility] numbers to call if he had any issues with the miralax.

## 2021-07-13 NOTE — Progress Notes (Unsigned)
Vitals-CW  Pt's states no medical or surgical changes since previsit or office visit. 

## 2021-07-13 NOTE — Progress Notes (Unsigned)
1435 Robinul 0.1 mg IV given due large amount of secretions upon assessment.  MD made aware, vss 

## 2021-07-13 NOTE — Progress Notes (Unsigned)
1442 HR > 100 with esmolol 25 mg given IV, MD updated, vss  

## 2021-07-13 NOTE — Progress Notes (Unsigned)
1515 HR > 100 with esmolol 25 mg given IV, MD updated, vss 

## 2021-07-13 NOTE — Progress Notes (Unsigned)
Report given to PACU, vss 

## 2021-07-13 NOTE — Op Note (Addendum)
[facility] Patient Name: Trevor Reilly Procedure Date: 07/13/2021 2:32 PM MRN: [id number] Endoscopist: Jackquline Denmark , MD Age: 51 Referring MD:  Date of Birth: 06-24-1970 Gender: Male Account #: [id number] Procedure:                Upper GI endoscopy Indications:              H/O N/V. Advanced liver cirrhosis. Medicines:                Monitored Anesthesia Care Procedure:                Pre-Anesthesia Assessment:                           - Prior to the procedure, a History and Physical                            was performed, and patient medications and                            allergies were reviewed. The patient's tolerance of                            previous anesthesia was also reviewed. The risks                            and benefits of the procedure and the sedation                            options and risks were discussed with the patient.                            All questions were answered, and informed consent                            was obtained. Prior Anticoagulants: The patient has                            taken no previous anticoagulant or antiplatelet                            agents. ASA Grade Assessment: III - A patient with                            severe systemic disease. After reviewing the risks                            and benefits, the patient was deemed in                            satisfactory condition to undergo the procedure.                           After obtaining informed consent, the endoscope was  passed under direct vision. Throughout the                            procedure, the patient's blood pressure, pulse, and                            oxygen saturations were monitored continuously. The                            GIF [id number] #[id number] was introduced through the                            mouth, and advanced to the second part of duodenum.                            The upper GI endoscopy  was accomplished without                            difficulty. The patient tolerated the procedure                            well. Scope In: Scope Out: Findings:                 The examined esophagus was normal. No esophageal                            varices.                           A small hiatal hernia was present.                           Mild portal hypertensive gastropathy was found in                            the entire examined stomach. Biopsies were taken                            with a cold forceps for histology. No fundal                            varices on retroflexed examination.                           The examined duodenum was normal. Complications:            No immediate complications. Estimated Blood Loss:     Estimated blood loss: none. Impression:               - Small hiatal hernia.                           - Portal hypertensive gastropathy. Biopsied.                           - Normal examined duodenum. Recommendation:           -  Patient has a contact number available for                            emergencies. The signs and symptoms of potential                            delayed complications were discussed with the                            patient. Return to normal activities tomorrow.                            Written discharge instructions were provided to the                            patient.                           -Low-salt diet.                           - Continue present medications.                           - Stop all ETOH                           - Must continue Protonix 40 mg p.o. once a day                           - No aspirin, ibuprofen, naproxen, or other                            non-steroidal anti-inflammatory drugs.                           - Check CBC, CMP, PT INR, AFP, ACE level today.                           - Await pathology results.                           - FU GI in 4-6 weeks with Colleen. (when he  returns                            from [place]). If still abstinent of alcohol,                            refer to [facility].                           - The findings and recommendations were discussed                            with the patient's family. Jackquline Denmark, MD 07/13/2021 3:22:20 PM This report has been  signed electronically.

## 2021-07-13 NOTE — Telephone Encounter (Signed)
Inbound call from patient stating he has a procedure today at 2:30 with Dr. Lyndel Safe. Patient stated that the prep is " not agreeing with his stomach" and is wanting to speak to a nurse. Please advise.

## 2021-07-13 NOTE — Op Note (Signed)
[facility] Patient Name: Trevor Reilly Procedure Date: 07/13/2021 2:31 PM MRN: [id number] Endoscopist: Jackquline Denmark , MD Age: 51 Referring MD:  Date of Birth: Nov 02, 1970 Gender: Male Account #: [id number] Procedure:                Colonoscopy Indications:              Screening for colon cancer: Family history of                            colorectal cancer in second-degree relative                            (maternal GM >60) Medicines:                Monitored Anesthesia Care Procedure:                Pre-Anesthesia Assessment:                           - Prior to the procedure, a History and Physical                            was performed, and patient medications and                            allergies were reviewed. The patient's tolerance of                            previous anesthesia was also reviewed. The risks                            and benefits of the procedure and the sedation                            options and risks were discussed with the patient.                            All questions were answered, and informed consent                            was obtained. Prior Anticoagulants: The patient has                            taken no previous anticoagulant or antiplatelet                            agents. ASA Grade Assessment: III - A patient with                            severe systemic disease. After reviewing the risks                            and benefits, the patient was deemed in  satisfactory condition to undergo the procedure.                           After obtaining informed consent, the colonoscope                            was passed under direct vision. Throughout the                            procedure, the patient's blood pressure, pulse, and                            oxygen saturations were monitored continuously. The                            CF [id number] #[id number] was introduced through the anus                             and advanced to the 2 cm into the ileum. The                            colonoscopy was performed without difficulty. The                            patient tolerated the procedure well. The quality                            of the bowel preparation was good. The ileocecal                            valve, appendiceal orifice, and rectum were                            photographed. Scope In: 2:51:20 PM Scope Out: 3:17:48 PM Scope Withdrawal Time: 0 hours 21 minutes 7 seconds  Total Procedure Duration: 0 hours 26 minutes 28 seconds  Findings:                 Four sessile polyps were found in the proximal                            ascending colon and mid ascending colon. The polyps                            were 6 to 8 mm in size. These polyps were removed                            with a cold snare. Resection and retrieval were                            complete.                           Two sessile polyps were found in the mid descending  colon. The polyps were 4 to 6 mm in size. These                            polyps were removed with a cold snare. Resection                            and retrieval were complete. Few small (3-4 mm)                            hyperplastic appearing (confirmed by NBI) polyps                            were visualized in the distal sigmoid colon-not                            removed as these were hyperplastic.                           A 10 mm polyp was found in the proximal rectum. The                            polyp was sessile. The polyp was removed with a hot                            snare. Resection and retrieval were complete.                           The terminal ileum appeared normal.                           Non-bleeding internal hemorrhoids were found during                            retroflexion. The hemorrhoids were small and Grade                            I (internal hemorrhoids  that do not prolapse).                           The exam was otherwise without abnormality on                            direct and retroflexion views. Complications:            No immediate complications. Estimated Blood Loss:     Estimated blood loss: none. Impression:               - Four 6 to 8 mm polyps in the proximal ascending                            colon and in the mid ascending colon, removed with                            a cold  snare. Resected and retrieved.                           - Two 4 to 6 mm polyps in the mid descending colon,                            removed with a cold snare. Resected and retrieved.                           - One 10 mm polyp in the proximal rectum, removed                            with a hot snare. Resected and retrieved.                           - The examined portion of the ileum was normal.                           - Non-bleeding internal hemorrhoids.                           - The examination was otherwise normal on direct                            and retroflexion views. Recommendation:           - Patient has a contact number available for                            emergencies. The signs and symptoms of potential                            delayed complications were discussed with the                            patient. Return to normal activities tomorrow.                            Written discharge instructions were provided to the                            patient.                           - Resume previous diet.                           - Continue present medications.                           - Await pathology results.                           - Repeat colonoscopy for surveillance based on  pathology results.                           - The findings and recommendations were discussed                            with the patient's family. Jackquline Denmark, MD 07/13/2021 3:28:14 PM This report has  been signed electronically.

## 2021-07-13 NOTE — Progress Notes (Unsigned)
Prev clinic note reviewed. ED note from 06/15/2021 reviewed. Labs reviewed. Hb 12.6, platelets 126 4 weeks ago.. TB 7.6. Alb 1.9. AST/ALT c/w ETOH MRCP-advanced liver cirrhosis with minimal ascites. Liver cirrhosis d/t ETOH/Sarcoid/both  Patient here for EGD/colonoscopy.  Had canceled his previous EGD/colon. Since he is here prepped, would proceed with endoscopic procedures.  RG            Trevor Reilly [id number] [birth year]     CHIEF COMPLAINT: Fatty liver, upper abdominal pain   HISTORY OF PRESENT ILLNESS:  Trevor Reilly is a 51 year old male with a past medical history of depression, sarcoidosis, hypertension, RBBB, GERD, alcohol use disorder elevated LFTs and fatty liver. He presents to our office today as referred by Lily Peer NP for further evaluation regarding cirrhosis. He went to the ED 11/17/202 due to having N/V and diarrhea. Labs in the ED showed an elevated T. Bili level of 4.7 (up from 1.4), Alk phos 218, AST 160 and a normal ALT level of 27. RUQ sono identified coarsened liver echogenicity with nodular contour suggestive of chronic liver disease/hepatic cirrhosis and reversal of flow in the portal vein was suggestive of portal hypertension.  No evidence of ascites.  The gallbladder was normal.  He denies having any issues with confusion, pruritus or jaundice.  He complains of having epigastric pain described as sharp and occurs randomly several days weekly for the past month.  Eating does not trigger his epigastric pain.  He has nausea and has vomited 2 or 3 times once weekly.  Emesis is yellow-green bilious with a few specks of bright red blood.  No coffee-ground emesis.  He experiences heartburn if he drinks beer on an empty stomach or if he eats spicy foods.  No dysphagia.  He typically passes a formed brown bowel movement most days.  However, over the past year he intermittently passes a loose black stool.  No Pepto-Bismol or oral iron use.  No bright red rectal  bleeding.  He reported taking Nexium for a few months when he was prescribed a pain medication for his shoulder around 06/2020.  He denies ever having an EGD or screening colonoscopy.  Maternal grandmother was diagnosed with colon cancer in her 27s mother had hepatitis secondary to a blood transfusion in the 1980s, it is unclear if she had hepatitis  B or C.  No known family history of liver cancer. History of sarcoidosis, previously on steroids x 10+ years, off x 4 years. He was recently seen by pulmonologist Dr. Silas Flood, at that time he was asymptomatic with a stable chest xray therefore steroid therapy was not recommended.  There was no evidence of hepatic granulomas per RUQ sonogram 12/2020 and CTAP 08/2019.  He previously drank a bottle of liquor which would last 2 to 3 days for 3-4 consecutive years in the early 2000's then switched to drinking mostly beer.  He drinks two 22 ounce beers 5 days weekly and on Saturdays he drinks four to five  12 ounce beers.  No drug use.   CBC Latest Ref Rng & Units 01/11/2021 05/24/2020 04/13/2020  WBC 4.0 - 10.5 K/uL 6.4 - 6.6  Hemoglobin 13.0 - 17.0 g/dL 14.5 17.7(H) 14.7  Hematocrit 39.0 - 52.0 % 43.1 52.0 44.4  Platelets 150 - 400 K/uL 169 - 180      CMP Latest Ref Rng & Units 01/11/2021 05/24/2020 04/13/2020  Glucose 70 - 99 mg/dL 108(H) 112(H) 83  BUN 6 - 20 mg/dL 6 3(L)  5(L)  Creatinine 0.61 - 1.24 mg/dL 0.70 0.70 0.64  Sodium 135 - 145 mmol/L 133(L) 142 134(L)  Potassium 3.5 - 5.1 mmol/L 3.5 4.2 3.6  Chloride 98 - 111 mmol/L 101 102 101  CO2 22 - 32 mmol/L 23 - 23  Calcium 8.9 - 10.3 mg/dL 8.1(L) - 8.3(L)  Total Protein 6.5 - 8.1 g/dL 7.8 - 8.4(H)  Total Bilirubin 0.3 - 1.2 mg/dL 4.7(H) - 1.4(H)  Alkaline Phos 38 - 126 U/L 218(H) - 186(H)  AST 15 - 41 U/L 160(H) - 119(H)  ALT 0 - 44 U/L 27 - 21      RUQ sonogram 01/11/2021: ULTRASOUND ABDOMEN LIMITED RIGHT UPPER QUADRANT   COMPARISON:  CT 09/20/2019   FINDINGS: Gallbladder:   No gallstones  or wall thickening visualized. No sonographic Murphy sign noted by sonographer.   Common bile duct:   Diameter: 4.6 mm   Liver:   Coarse liver echogenicity with nodular contours. Reversal of flow in the portal vein.   Other: None.   IMPRESSION: Coarse liver echogenicity with nodular contours suggestive of chronic liver disease/hepatic cirrhosis.   Reversal of flow in the portal vein suggesting portal hypertension.   CTAP 09/20/2019: Chronic bronchial wall thickening likely related to the patient's underlying history of sarcoidosis.  Diffuse abdominal adenopathy also consistent with the given clinical history of sarcoidosis.  Nodular changes in the liver consistent with underlying cirrhosis.  Large left hydrocele       Past Medical History:  Diagnosis Date   Chronic cough      05-22-2020 per pt morning productive with yellow sputum   Depression     ED (erectile dysfunction)     Epididymal cyst      left   Fatty liver      05-22-2020  per pt has been referred to GI,  last ultrasound in epic 06-10-2016   GERD (gastroesophageal reflux disease)     History of community acquired pneumonia 04/13/2020    ED visit in epic   History of rectal bleeding     Hypertension      05-22-2020 followed by new pcp--- no medication currently   Mild obstructive sleep apnea      05-22-2020  per pt had study approx. 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    05-19-2020  currently started seeing new pcp,  previous seen by pulmonolgy , Elijio Miles PA([facility]) lov note 05-20-2016 [facility]   Sarcoidosis of skin      05-22-2020  per pt lower legs   Wears glasses           Past Surgical History:  Procedure Laterality Date   CYSTOSCOPY   05/24/2020    Procedure: CYSTOSCOPY FLEXIBLE;  Surgeon: Lucas Mallow, MD;  Location: [facility];   Service: Urology;;   EPIDIDYMECTOMY Left 05/24/2020    Procedure: LEFT EPIDIDYMAL CYST REMOVAL;  Surgeon: Lucas Mallow, MD;  Location: [facility];  Service: Urology;  Laterality: Left;   LUNG BIOPSY   2007    IR   CT guided needle lung biopsy's and washing's     Social History: He is single.  He is a Freight forwarder.  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 He drinks two 22 oz beers 5 day weekly and on Saturdays he drinks four to five 12 oz beers. No drug use.    Family History: Maternal grandmother diagnosed with colon cancer in her 11's. Brother with history of lung cancer.    No Known Allergies       Outpatient Encounter Medications as of 05/07/2021  Medication Sig   acetaminophen (TYLENOL 8 HOUR) 650 MG CR tablet Take 1 tablet (650 mg total) by mouth every 8 (eight) hours.   albuterol (VENTOLIN HFA) 108 (90 Base) MCG/ACT inhaler Inhale into the lungs as needed.   esomeprazole (NEXIUM) 40 MG capsule Take 40 mg by mouth daily.   fluticasone (FLONASE) 50 MCG/ACT nasal spray SMARTSIG:1-2 Spray(s) Both Nares Daily PRN   HYDROcodone-acetaminophen (NORCO/VICODIN) 5-325 MG tablet Take 1 tablet by mouth every 6 (six) hours as needed for severe pain.   ondansetron (ZOFRAN) 4 MG tablet Take 1 tablet (4 mg total) by mouth every 8 (eight) hours as needed for nausea or vomiting.   pantoprazole (PROTONIX) 20 MG tablet Take by mouth.   pantoprazole (PROTONIX) 40 MG tablet Take 1 tablet (40 mg total) by mouth daily. (Patient taking differently: Take 40 mg by mouth every evening.)   SYMBICORT 160-4.5 MCG/ACT inhaler SMARTSIG:2 Puff(s) By Mouth Morning-Evening    No facility-administered encounter medications on file as of 05/07/2021.    REVIEW OF SYSTEMS:  Gen: Denies fever, sweats or chills. No weight loss.  CV: Denies chest pain, palpitations or edema. Resp: Denies cough, shortness of breath of  hemoptysis.  GI: See HPI.   GU : Denies urinary burning, blood in urine, increased urinary frequency or incontinence. MS: Denies joint pain, muscles aches or weakness. Derm: Denies rash, itchiness, skin lesions or unhealing ulcers. Psych: Denies depression, anxiety, memory loss, suicidal ideation and confusion. Heme: Denies bruising, easy bleeding. Neuro:  Denies headaches, dizziness or paresthesias. Endo:  Denies any problems with DM, thyroid or adrenal function.   PHYSICAL EXAM: BP 110/74   Pulse 85   Ht 5' 9"  (1.753 m)   Wt 204 lb (92.5 kg)   SpO2 98%   BMI 30.13 kg/m    General: 51 year old male in no acute distress. Head: Normocephalic and atraumatic. Eyes:  Sclerae non-icteric, conjunctive pink. Ears: Normal auditory acuity. Mouth: Dentition intact. No ulcers or lesions.  Neck: Supple, no lymphadenopathy or thyromegaly.  Lungs: Clear bilaterally to auscultation without wheezes, crackles or rhonchi. Heart: Regular rate and rhythm. No murmur, rub or gallop appreciated.  Abdomen: Soft, nontender, non distended. No masses. No hepatosplenomegaly. Normoactive bowel sounds x 4 quadrants.  Rectal: Deferred Musculoskeletal: Symmetrical with no gross deformities. Skin: Warm and dry. No rash or lesions on visible extremities. Extremities: No edema. Neurological: Alert oriented x 4, no focal deficits.  Psychological:  Alert and cooperative. Normal mood and affect.   ASSESSMENT AND PLAN:   12) 51 year old male with elevated LFTs, fatty liver disease with possible cirrhosis. RUQ sono 12/2020 showed coarsened liver echogenicity with nodular contour suggestive of chronic liver disease/hepatic cirrhosis and reversal of flow in the portal vein was suggestive of portal hypertension.  No evidence of ascites or hepatic granulomas.  Labs 01/11/2021 with elevated total bilirubin level and AST/ALT ratio consistent with alcohol use disorder.  Total bili 4.7.  Alk phos 218.  AST 160.  ALT 27.   Albumin 2.7.  Normal platelet count 169. -Hepatitis A total antibody, Hepatitis B surface antigen, Hepatitis B core total antibody,  Hepatitis C antibody, IgG, ANA, SMA, AMA, ceruloplasmin, GGT, A1AT, BMP, hepatic panel, BMP, CBC and INR -Abdominal MRI/MRCP with and without contrast -If abdominal MRI further confirms cirrhosis he will require liver imaging and AFP every 6 months -He will need Hep A and Hep B vaccinations if not immune  -Patient counseled to wean off alcohol   2) Pulmonary sarcoidosis, stable per recent evaluation by pulmonologist Dr. Silas Flood   3) Colon cancer screening. Mother with history of colon cancer. -Colonoscopy benefits and risks discussed including risk with sedation, risk of bleeding, perforation and infection    4) Epigastric pain, N/V x 4 weeks.  Bilious emesis with specks of bright red blood.  Infrequent heartburn. -EGD to assess for GERD/Barrett's esophagus and esophageal/gastric varices  -GERD/ulcer handout -Omeprazole 20 mg daily   Further recommendations to be determined after the above evaluation completed.       CC:  Delford Field, FNP

## 2021-07-16 ENCOUNTER — Telehealth: Payer: Self-pay | Admitting: *Deleted

## 2021-07-16 ENCOUNTER — Other Ambulatory Visit: Payer: Self-pay

## 2021-07-16 ENCOUNTER — Telehealth: Payer: Self-pay

## 2021-07-16 DIAGNOSIS — E876 Hypokalemia: Secondary | ICD-10-CM

## 2021-07-16 DIAGNOSIS — K2970 Gastritis, unspecified, without bleeding: Secondary | ICD-10-CM

## 2021-07-16 LAB — ANGIOTENSIN CONVERTING ENZYME: Angiotensin-Converting Enzyme: 93 U/L — ABNORMAL HIGH (ref 9–67)

## 2021-07-16 LAB — AFP TUMOR MARKER: AFP-Tumor Marker: 4 ng/mL (ref <6.1)

## 2021-07-16 MED ORDER — PANTOPRAZOLE SODIUM 40 MG PO TBEC: 40.0000 mg | DELAYED_RELEASE_TABLET | Freq: Every day | ORAL | 3 refills | Status: DC

## 2021-07-16 MED ORDER — POTASSIUM CHLORIDE CRYS ER 20 MEQ PO TBCR: 20.0000 meq | EXTENDED_RELEASE_TABLET | Freq: Every day | ORAL | 2 refills | Status: DC

## 2021-07-16 NOTE — Telephone Encounter (Signed)
Called [phone] and left a message we tried to reach pt for a follow up call. maw

## 2021-07-16 NOTE — Telephone Encounter (Signed)
Second follow up call attempt.  No answer.  Message left earlier today.

## 2021-07-28 ENCOUNTER — Encounter: Payer: Self-pay | Admitting: Gastroenterology

## 2021-08-01 ENCOUNTER — Ambulatory Visit: Payer: BC Managed Care – PPO | Admitting: Gastroenterology

## 2021-08-08 DIAGNOSIS — H2513 Age-related nuclear cataract, bilateral: Secondary | ICD-10-CM | POA: Diagnosis not present

## 2021-08-08 DIAGNOSIS — H5203 Hypermetropia, bilateral: Secondary | ICD-10-CM | POA: Diagnosis not present

## 2021-08-13 ENCOUNTER — Telehealth: Payer: Self-pay | Admitting: Gastroenterology

## 2021-08-13 NOTE — Telephone Encounter (Signed)
Inbound call from patient stating that he had a procedure with Dr. Lyndel Safe in May, and since the procedure he has been having abd pain and feeling dizzy. Patient is requesting a call back to discuss. Please advise.

## 2021-08-13 NOTE — Telephone Encounter (Signed)
Pt states that he has been having nausea/ vomiting, abdominal pain, lightheaded and dizziness on and off for the last several weeks. Chart reviewed. Pt encouraged to go to the ED asap for evaluation and treatment. Pt recently was a no show on 08/01/2021 with Dr. Lyndel Safe at office Visit. Pt rescheduled for an office visit on 09/11/2021 with Dr. Lyndel Safe at 8:30 AM. Pt made aware. Pt was notified to not wait until this office visit to be evaluated  but to go to the ED asap for evaluation and treatment.  Pt verbalized understanding with all questions answered.

## 2021-08-18 ENCOUNTER — Other Ambulatory Visit: Payer: Self-pay

## 2021-08-18 ENCOUNTER — Encounter (HOSPITAL_BASED_OUTPATIENT_CLINIC_OR_DEPARTMENT_OTHER): Payer: Self-pay | Admitting: Emergency Medicine

## 2021-08-18 ENCOUNTER — Emergency Department (HOSPITAL_BASED_OUTPATIENT_CLINIC_OR_DEPARTMENT_OTHER): Payer: BC Managed Care – PPO

## 2021-08-18 ENCOUNTER — Emergency Department (HOSPITAL_BASED_OUTPATIENT_CLINIC_OR_DEPARTMENT_OTHER)
Admission: EM | Admit: 2021-08-18 | Discharge: 2021-08-19 | Disposition: A | Discharge status: Home / Self Care | Payer: BC Managed Care – PPO | Attending: Emergency Medicine | Admitting: Emergency Medicine

## 2021-08-18 DIAGNOSIS — I10 Essential (primary) hypertension: Secondary | ICD-10-CM | POA: Insufficient documentation

## 2021-08-18 DIAGNOSIS — E871 Hypo-osmolality and hyponatremia: Secondary | ICD-10-CM | POA: Insufficient documentation

## 2021-08-18 DIAGNOSIS — R0602 Shortness of breath: Secondary | ICD-10-CM | POA: Diagnosis not present

## 2021-08-18 DIAGNOSIS — D869 Sarcoidosis, unspecified: Secondary | ICD-10-CM | POA: Insufficient documentation

## 2021-08-18 LAB — COMPREHENSIVE METABOLIC PANEL
ALT: 25 U/L (ref 0–44)
AST: 96 U/L — ABNORMAL HIGH (ref 15–41)
Albumin: 1.6 g/dL — ABNORMAL LOW (ref 3.5–5.0)
Alkaline Phosphatase: 134 U/L — ABNORMAL HIGH (ref 38–126)
Anion gap: 5 (ref 5–15)
BUN: <5 mg/dL — ABNORMAL LOW (ref 6–20)
CO2: 23 mmol/L (ref 22–32)
Calcium: 7.5 mg/dL — ABNORMAL LOW (ref 8.9–10.3)
Chloride: 103 mmol/L (ref 98–111)
Creatinine, Ser: 0.88 mg/dL (ref 0.61–1.24)
GFR, Estimated: >60 mL/min (ref >60)
Glucose, Bld: 126 mg/dL — ABNORMAL HIGH (ref 70–99)
Potassium: 3.4 mmol/L — ABNORMAL LOW (ref 3.5–5.1)
Sodium: 131 mmol/L — ABNORMAL LOW (ref 135–145)
Total Bilirubin: 5 mg/dL — ABNORMAL HIGH (ref 0.3–1.2)
Total Protein: 7.2 g/dL (ref 6.5–8.1)

## 2021-08-18 LAB — CBC
HCT: 33.1 % — ABNORMAL LOW (ref 39.0–52.0)
Hemoglobin: 11.3 g/dL — ABNORMAL LOW (ref 13.0–17.0)
MCH: 33 pg (ref 26.0–34.0)
MCHC: 34.1 g/dL (ref 30.0–36.0)
MCV: 96.8 fL (ref 80.0–100.0)
Platelets: 97 10*3/uL — ABNORMAL LOW (ref 150–400)
RBC: 3.42 MIL/uL — ABNORMAL LOW (ref 4.22–5.81)
RDW: 15.2 % (ref 11.5–15.5)
WBC: 7 10*3/uL (ref 4.0–10.5)
nRBC: 0 % (ref 0.0–0.2)

## 2021-08-18 LAB — URINALYSIS, ROUTINE W REFLEX MICROSCOPIC
Glucose, UA: NEGATIVE mg/dL
Ketones, ur: NEGATIVE mg/dL
Leukocytes,Ua: NEGATIVE
Nitrite: NEGATIVE
Protein, ur: NEGATIVE mg/dL
Specific Gravity, Urine: 1.02 (ref 1.005–1.030)
pH: 5.5 (ref 5.0–8.0)

## 2021-08-18 LAB — URINALYSIS, MICROSCOPIC (REFLEX)
Bacteria, UA: NONE SEEN
RBC / HPF: NONE SEEN RBC/hpf (ref 0–5)
WBC, UA: NONE SEEN WBC/hpf (ref 0–5)

## 2021-08-18 LAB — LIPASE, BLOOD: Lipase: 41 U/L (ref 11–51)

## 2021-08-18 MED ORDER — AZITHROMYCIN 250 MG PO TABS
250.0000 mg | ORAL_TABLET | Freq: Every day | ORAL | 0 refills | Status: AC
Start: 2021-08-18 — End: 2021-08-22

## 2021-08-18 MED ORDER — ONDANSETRON HCL 4 MG/2ML IJ SOLN
4.0000 mg | Freq: Once | INTRAMUSCULAR | Status: AC
  Administered 2021-08-18: 4 mg via INTRAVENOUS
  Filled 2021-08-18: qty 2

## 2021-08-18 MED ORDER — SODIUM CHLORIDE 0.9 % IV BOLUS
1000.0000 mL | Freq: Once | INTRAVENOUS | Status: AC
  Administered 2021-08-18: 1000 mL via INTRAVENOUS

## 2021-08-18 MED ORDER — AZITHROMYCIN 250 MG PO TABS
500.0000 mg | ORAL_TABLET | Freq: Once | ORAL | Status: AC
  Administered 2021-08-18: 500 mg via ORAL
  Filled 2021-08-18: qty 2

## 2021-08-18 MED ORDER — PREDNISONE 50 MG PO TABS
60.0000 mg | ORAL_TABLET | Freq: Once | ORAL | Status: AC
  Administered 2021-08-18: 60 mg via ORAL
  Filled 2021-08-18: qty 1

## 2021-08-18 MED ORDER — PREDNISONE 20 MG PO TABS: ORAL_TABLET | ORAL | 0 refills | Status: DC

## 2021-08-23 DIAGNOSIS — Z23 Encounter for immunization: Secondary | ICD-10-CM | POA: Diagnosis not present

## 2021-08-23 DIAGNOSIS — D860 Sarcoidosis of lung: Secondary | ICD-10-CM | POA: Diagnosis not present

## 2021-08-23 DIAGNOSIS — E876 Hypokalemia: Secondary | ICD-10-CM | POA: Diagnosis not present

## 2021-08-23 DIAGNOSIS — I10 Essential (primary) hypertension: Secondary | ICD-10-CM | POA: Diagnosis not present

## 2021-08-23 DIAGNOSIS — M62838 Other muscle spasm: Secondary | ICD-10-CM | POA: Diagnosis not present

## 2021-08-23 DIAGNOSIS — E559 Vitamin D deficiency, unspecified: Secondary | ICD-10-CM | POA: Diagnosis not present

## 2021-08-23 DIAGNOSIS — E871 Hypo-osmolality and hyponatremia: Secondary | ICD-10-CM | POA: Diagnosis not present

## 2021-08-23 DIAGNOSIS — L309 Dermatitis, unspecified: Secondary | ICD-10-CM | POA: Diagnosis not present

## 2021-09-07 ENCOUNTER — Other Ambulatory Visit: Payer: Self-pay | Admitting: Nurse Practitioner

## 2021-09-07 ENCOUNTER — Other Ambulatory Visit (HOSPITAL_COMMUNITY): Payer: Self-pay | Admitting: Nurse Practitioner

## 2021-09-07 DIAGNOSIS — K766 Portal hypertension: Secondary | ICD-10-CM | POA: Diagnosis not present

## 2021-09-07 DIAGNOSIS — R188 Other ascites: Secondary | ICD-10-CM

## 2021-09-07 DIAGNOSIS — I10 Essential (primary) hypertension: Secondary | ICD-10-CM | POA: Diagnosis present

## 2021-09-07 DIAGNOSIS — K3189 Other diseases of stomach and duodenum: Secondary | ICD-10-CM | POA: Diagnosis not present

## 2021-09-07 DIAGNOSIS — K7469 Other cirrhosis of liver: Secondary | ICD-10-CM | POA: Diagnosis not present

## 2021-09-11 ENCOUNTER — Ambulatory Visit: Payer: BC Managed Care – PPO | Admitting: Gastroenterology

## 2021-09-13 ENCOUNTER — Other Ambulatory Visit: Payer: Self-pay | Admitting: Nurse Practitioner

## 2021-09-13 ENCOUNTER — Other Ambulatory Visit (HOSPITAL_COMMUNITY): Payer: Self-pay | Admitting: Nurse Practitioner

## 2021-09-13 DIAGNOSIS — K7469 Other cirrhosis of liver: Secondary | ICD-10-CM

## 2021-09-14 ENCOUNTER — Ambulatory Visit (HOSPITAL_COMMUNITY)
Admission: RE | Admit: 2021-09-14 | Discharge: 2021-09-14 | Disposition: A | Discharge status: Home / Self Care | Payer: BC Managed Care – PPO | Source: Ambulatory Visit | Attending: Nurse Practitioner | Admitting: Nurse Practitioner

## 2021-09-14 DIAGNOSIS — R188 Other ascites: Secondary | ICD-10-CM | POA: Diagnosis not present

## 2021-09-14 DIAGNOSIS — K7460 Unspecified cirrhosis of liver: Secondary | ICD-10-CM | POA: Diagnosis not present

## 2021-09-14 HISTORY — PX: IR PARACENTESIS: IMG2679

## 2021-09-14 LAB — BODY FLUID CELL COUNT WITH DIFFERENTIAL
Eos, Fluid: 0 %
Lymphs, Fluid: 82 %
Monocyte-Macrophage-Serous Fluid: 13 % — ABNORMAL LOW (ref 50–90)
Neutrophil Count, Fluid: 5 % (ref 0–25)
Total Nucleated Cell Count, Fluid: 181 cu mm (ref 0–1000)

## 2021-09-14 LAB — GRAM STAIN: Gram Stain: NONE SEEN

## 2021-09-14 MED ORDER — LIDOCAINE HCL (PF) 1 % IJ SOLN
INTRAMUSCULAR | Status: DC | PRN
  Administered 2021-09-14: 10 mL

## 2021-09-14 MED ORDER — LIDOCAINE HCL 1 % IJ SOLN
INTRAMUSCULAR | Status: AC
  Filled 2021-09-14: qty 20

## 2021-09-19 LAB — CULTURE, BODY FLUID W GRAM STAIN -BOTTLE: Culture: NO GROWTH

## 2021-09-19 LAB — PATHOLOGIST SMEAR REVIEW: Path Review: NEGATIVE

## 2021-09-21 ENCOUNTER — Encounter: Payer: Self-pay | Admitting: Pulmonary Disease

## 2021-09-21 ENCOUNTER — Ambulatory Visit (INDEPENDENT_AMBULATORY_CARE_PROVIDER_SITE_OTHER): Payer: BC Managed Care – PPO | Admitting: Pulmonary Disease

## 2021-09-21 VITALS — HR 93 | Ht 69.0 in | Wt 218.6 lb

## 2021-09-21 DIAGNOSIS — J301 Allergic rhinitis due to pollen: Secondary | ICD-10-CM | POA: Diagnosis not present

## 2021-09-21 DIAGNOSIS — D860 Sarcoidosis of lung: Secondary | ICD-10-CM | POA: Diagnosis not present

## 2021-09-21 NOTE — Patient Instructions (Addendum)
Asked to see you again  The fluid buildup is coming from extra pressure in the liver due to cirrhosis  Please contact your GI doctor at [facility] to arrange another paracentesis if you feel you need it as well as ask about if you can increase your fluid pills to try to help keep the fluid in the belly from building up so fast  The liver biopsy will be very helpful to understand if sarcoidosis is contributing to the liver cirrhosis.  If so, I would like for you to meet with a rheumatologist to discuss additional medications if it would be helpful.  But lets get the results and see.  Is possible that there is no sign of sarcoidosis on the liver biopsy which would be good.  And we would need to change any medications.  Return to clinic in 3 months or sooner if needed with Dr. Silas Flood

## 2021-09-21 NOTE — Progress Notes (Signed)
$'@Patient'n$  ID: Trevor Reilly, male    DOB: Dec 01, 1970, 51 y.o.   MRN: [id number]  Chief Complaint  Patient presents with   Follow-up    Follow-up for fluid drainage from stomach    Referring provider: Delford Field, FNP  HPI:   51 y.o. man whom we are seeing in consultation for evaluation of sarcoidosis.  Most recent GI/hepatology note reviewed.   Overall, breathing stable.  No relation of dyspnea on exertion.  Cough is improved.  Prescribed intranasal therapy given concern for postnasal drip.  Cough essentially gone.  Uses rescue inhaler rarely for shortness of breath.  This seems to occur more when fluid builds up in the abdomen.  This makes sense and was discussed.  He is most concerned about fluid buildup in the abdomen.  Discussed again is related to high blood pressure going to the liver and fluid buildup.  He has had it drained once with paracentesis.  Cytology reviewed.  In addition, he suddenly started diuretics.  He thinks it is helped some especially lower extremity swelling but the fluid buildup in the abdomen has occurred again.  He is asking about paracentesis.  He was encouraged to contact his GI doctor to ask about arranging this procedure as well as recommendations regarding increasing diuretics if able.  HPI at initial visit: Patient overall doing well.  Denies any respiratory symptoms.  No shortness of breath, dyspnea.  Diagnosed with sarcoidosis per report 2007.  Via endobronchial biopsy presumably of lymph node.  Was placed on steroids at that time.  Unclear dose.  Was on this for about 10 years.  He was lost to follow-up to prior pulmonologist at [facility].  Has been off prednisone for 4 years.  No development of respiratory symptoms as above.  His primary complaint is cough.  Productive of phlegm.  Associate with significant sinus congestion, nasal congestion, rhinorrhea.  He is unsure if he feels sensation of phlegm dripping on the back of his throat from his  nasal passages.  Zyrtec helps some.  Flonase not very helpful per his report.  No other relieving or exacerbating factors that he can identify.  Cough present for many many years.  Reviewed serial chest x-rays 3 from 06/2016 to 03/2020 that on My review interpretation reveals clear lungs without significant changes over the last 4 years.  PMH: Sarcoidosis, seasonal allergies, GERD Surgical history: Liver biopsy, epididymal cyst removal Family history: Cousin and uncle with sarcoidosis, mother with hypertension, father with CVA, CAD Social history: Former smoker, quit 2021, lives in [place], originally from [place] / Pulmonary Flowsheets:   ACT:      No data to display          MMRC:     No data to display          Epworth:      No data to display          Tests:   FENO:  No results found for: "NITRICOXIDE"  PFT:     No data to display          WALK:      No data to display          Imaging: Personally reviewed as per EMR discussion this note IR Paracentesis  Result Date: 09/14/2021 INDICATION: Cirrhosis with new onset ascites. Request for diagnostic and therapeutic paracentesis. EXAM: ULTRASOUND GUIDED RIGHT LOWER QUADRANT PARACENTESIS MEDICATIONS: 1% plain lidocaine, 5 mL COMPLICATIONS: None immediate. PROCEDURE:  Informed written consent was obtained from the patient after a discussion of the risks, benefits and alternatives to treatment. A timeout was performed prior to the initiation of the procedure. Initial ultrasound scanning demonstrates a large amount of ascites within the right lower abdominal quadrant. The right lower abdomen was prepped and draped in the usual sterile fashion. 1% lidocaine was used for local anesthesia. Following this, a 19 gauge, 10-cm, Yueh catheter was introduced. An ultrasound image was saved for documentation purposes. The paracentesis was performed. The catheter was removed and a dressing was  applied. The patient tolerated the procedure well without immediate post procedural complication. FINDINGS: A total of approximately 4.3 L of clear yellow fluid was removed. Samples were sent to the laboratory as requested by the clinical team. IMPRESSION: Successful ultrasound-guided paracentesis yielding 4.3 liters of peritoneal fluid. PLAN: If the patient eventually requires >/=2 paracenteses in a 30 day period, candidacy for formal evaluation by the [facility] will be assessed. Read by: Ascencion Dike PA-C Electronically Signed   By: Jerilynn Mages.  Shick M.D.   On: 09/14/2021 10:45    Lab Results: Personally reviewed CBC    Component Value Date/Time   WBC 7.0 08/18/2021 2057   RBC 3.42 (L) 08/18/2021 2057   HGB 11.3 (L) 08/18/2021 2057   HCT 33.1 (L) 08/18/2021 2057   PLT 97 (L) 08/18/2021 2057   MCV 96.8 08/18/2021 2057   MCH 33.0 08/18/2021 2057   MCHC 34.1 08/18/2021 2057   RDW 15.2 08/18/2021 2057   LYMPHSABS 1.1 07/13/2021 1559   MONOABS 0.3 07/13/2021 1559   EOSABS 0.3 07/13/2021 1559   BASOSABS 0.0 07/13/2021 1559    BMET    Component Value Date/Time   NA 131 (L) 08/18/2021 1945   K 3.4 (L) 08/18/2021 1945   CL 103 08/18/2021 1945   CO2 23 08/18/2021 1945   GLUCOSE 126 (H) 08/18/2021 1945   BUN <5 (L) 08/18/2021 1945   CREATININE 0.88 08/18/2021 1945   CALCIUM 7.5 (L) 08/18/2021 1945   GFRNONAA >60 08/18/2021 1945   GFRAA >60 09/20/2019 0128    BNP No results found for: "BNP"  ProBNP    Component Value Date/Time   PROBNP 12.7 01/09/2011 1500    Specialty Problems       Pulmonary Problems   Non-seasonal allergic rhinitis due to pollen   OSA (obstructive sleep apnea)    Formatting of this note might be different from the original. Hx of mild      Sarcoidosis of lung ([place])   Lung nodule     No Known Allergies  Immunization History  Administered Date(s) Administered   Influenza, Seasonal, Injecte,  Preservative Fre 01/26/2021   Influenza-Unspecified 12/09/2020   Tdap 08/05/2018   Zoster Recombinat (Shingrix) 08/23/2021    Past Medical History:  Diagnosis Date   Allergy    SEASONAL   Chronic cough    05-22-2020 per pt morning productive with yellow sputum   Depression    ED (erectile dysfunction)    Epididymal cyst    left   Fatty liver    05-22-2020  per pt has been referred to GI,  last ultrasound in epic 06-10-2016   GERD (gastroesophageal reflux disease)    History of community acquired pneumonia 04/13/2020   ED visit in epic   History of rectal bleeding    Hypertension    05-22-2020 followed by new pcp--- no medication currently   Mild obstructive sleep apnea    05-22-2020  per pt had study approx. 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   05-19-2020  currently started seeing new pcp,  previous seen by pulmonolgy , Elijio Miles PA([facility]) lov note 05-20-2016 [facility]   Sarcoidosis of skin    05-22-2020  per pt lower legs   Wears glasses     Tobacco History: Social History   Tobacco Use  Smoking Status Former   Packs/day: 0.50   Years: 25.00   Total pack years: 12.50   Types: Cigarettes   Quit date: 04/25/2019   Years since quitting: 2.4  Smokeless Tobacco Never   Counseling given: Not Answered   Continue to not smoke  Outpatient Encounter Medications as of 09/21/2021  Medication Sig   albuterol (VENTOLIN HFA) 108 (90 Base) MCG/ACT inhaler Inhale into the lungs as needed.   acetaminophen (TYLENOL 8 HOUR) 650 MG CR tablet Take 1 tablet (650 mg total) by mouth every 8 (eight) hours. (Patient not taking: Reported on 06/29/2021)   alum & mag hydroxide-simeth (MAALOX MAX) 400-400-40 MG/5ML suspension Take 10 mLs by mouth every 6 (six) hours as needed for indigestion.   dicyclomine (BENTYL) 20 MG tablet Take 1 tablet (20 mg total)  by mouth 3 (three) times daily as needed for spasms. (Patient not taking: Reported on 06/29/2021)   fluticasone (FLONASE) 50 MCG/ACT nasal spray SMARTSIG:1-2 Spray(s) Both Nares Daily PRN   Multiple Vitamins-Minerals (MULTIVITAMIN ADULTS 50+ PO) Take by mouth daily. TAKE ONE DAILY   omeprazole (PRILOSEC) 20 MG capsule Take 1 capsule (20 mg total) by mouth daily. (Patient not taking: Reported on 06/29/2021)   ondansetron (ZOFRAN-ODT) 4 MG disintegrating tablet Take 1 tablet (4 mg total) by mouth every 8 (eight) hours as needed. (Patient not taking: Reported on 06/29/2021)   pantoprazole (PROTONIX) 40 MG tablet Take 1 tablet (40 mg total) by mouth daily.   potassium chloride SA (KLOR-CON M) 20 MEQ tablet Take 1 tablet (20 mEq total) by mouth daily.   predniSONE (DELTASONE) 20 MG tablet Take 1 tablet daily   [DISCONTINUED] ondansetron (ZOFRAN) 4 MG tablet Take 1 tablet (4 mg total) by mouth every 8 (eight) hours as needed for nausea or vomiting.   No facility-administered encounter medications on file as of 09/21/2021.     Review of Systems  Review of Systems  N/a Physical Exam  Pulse 93   Ht '5\' 9"'$  (1.753 m)   Wt 218 lb 9.6 oz (99.2 kg)   SpO2 99%   BMI 32.28 kg/m   Wt Readings from Last 5 Encounters:  09/21/21 218 lb 9.6 oz (99.2 kg)  08/18/21 223 lb (101.2 kg)  07/13/21 210 lb (95.3 kg)  06/29/21 210 lb (95.3 kg)  06/15/21 203 lb 14.8 oz (92.5 kg)    BMI Readings from Last 5 Encounters:  09/21/21 32.28 kg/m  08/18/21 32.93 kg/m  07/13/21 31.01 kg/m  06/29/21 31.01 kg/m  06/15/21 30.11 kg/m     Physical Exam General: Well-appearing, no acute distress Eyes: EOMI, icterus Neck: Supple, no JVP Pulmonary: Clear, no work of breathing Cardiovascular: Regular rate and rhythm, no murmurs Abdomen: Nondistended, bowel sounds present MSK: No synovitis, no joint effusion Neuro: Normal gait, no weakness Psych: Normal mood, full affect   Assessment & Plan:   Sarcoidosis:  Diagnosed by endobronchial lymph node biopsy 2007 per his report.  Was  on steroids for 10+ years.  Initially did have pulmonary or respiratory symptoms of dyspnea on exertion.  These are now resolved.  Off steroids for about 4 years.  Discussed at length that given his lack of symptoms and stable chest imaging, additional steroid therapy for breathing is not needed.  Discussed the risk and benefits of steroid therapy with significant risks and ideally minimizing this in the future if possible.  No active pulmonary sarcoidosis at this time.  Recommend at least yearly eye exams..  Cough: Chronic.  Unclear if related to sarcoidosis.  High suspicion more related to sinus or nasal congestion.  Describes significant nasal congestion and sputum production.  Rhinorrhea at times.  At last visit he was encouraged to starting sinus rinses twice a day with instructions on how to do this.  In addition, recommended Flonase 2 sprays twice daily for a week then decreasing to 1 spray twice daily thereafter.  Things have improved.  Continue current regimen.  Consider ICS/LABA therapy in the future if needed.  Cirrhosis: Suspect more related to alcohol abuse.  Sarcoidosis possible.  Biopsy will be very helpful.  No signs of sarcoidosis no changes needed.  If signs of sarcoidosis and there is some viable liver tissue, would recommend rheumatology consultation for consideration of disease modifying medications directed toward liver.   Return in about 3 months (around 12/22/2021).   Lanier Clam, MD 09/21/2021

## 2021-09-24 ENCOUNTER — Encounter (HOSPITAL_BASED_OUTPATIENT_CLINIC_OR_DEPARTMENT_OTHER): Payer: Self-pay | Admitting: Emergency Medicine

## 2021-09-24 ENCOUNTER — Other Ambulatory Visit: Payer: Self-pay

## 2021-09-24 ENCOUNTER — Emergency Department (HOSPITAL_BASED_OUTPATIENT_CLINIC_OR_DEPARTMENT_OTHER)
Admission: EM | Admit: 2021-09-24 | Discharge: 2021-09-24 | Disposition: A | Discharge status: Home / Self Care | Payer: BC Managed Care – PPO | Attending: Emergency Medicine | Admitting: Emergency Medicine

## 2021-09-24 DIAGNOSIS — R600 Localized edema: Secondary | ICD-10-CM | POA: Insufficient documentation

## 2021-09-24 DIAGNOSIS — T300 Burn of unspecified body region, unspecified degree: Secondary | ICD-10-CM

## 2021-09-24 DIAGNOSIS — R112 Nausea with vomiting, unspecified: Secondary | ICD-10-CM | POA: Diagnosis not present

## 2021-09-24 DIAGNOSIS — Z79899 Other long term (current) drug therapy: Secondary | ICD-10-CM | POA: Diagnosis not present

## 2021-09-24 DIAGNOSIS — E8809 Other disorders of plasma-protein metabolism, not elsewhere classified: Secondary | ICD-10-CM

## 2021-09-24 DIAGNOSIS — R609 Edema, unspecified: Secondary | ICD-10-CM

## 2021-09-24 DIAGNOSIS — R770 Abnormality of albumin: Secondary | ICD-10-CM | POA: Diagnosis not present

## 2021-09-24 LAB — COMPREHENSIVE METABOLIC PANEL
ALT: 34 U/L (ref 0–44)
AST: 120 U/L — ABNORMAL HIGH (ref 15–41)
Albumin: 1.6 g/dL — ABNORMAL LOW (ref 3.5–5.0)
Alkaline Phosphatase: 168 U/L — ABNORMAL HIGH (ref 38–126)
Anion gap: 6 (ref 5–15)
BUN: 5 mg/dL — ABNORMAL LOW (ref 6–20)
CO2: 27 mmol/L (ref 22–32)
Calcium: 7.7 mg/dL — ABNORMAL LOW (ref 8.9–10.3)
Chloride: 102 mmol/L (ref 98–111)
Creatinine, Ser: 1.08 mg/dL (ref 0.61–1.24)
GFR, Estimated: >60 mL/min (ref >60)
Glucose, Bld: 119 mg/dL — ABNORMAL HIGH (ref 70–99)
Potassium: 4.6 mmol/L (ref 3.5–5.1)
Sodium: 135 mmol/L (ref 135–145)
Total Bilirubin: 4.4 mg/dL — ABNORMAL HIGH (ref 0.3–1.2)
Total Protein: 7.6 g/dL (ref 6.5–8.1)

## 2021-09-24 LAB — URINALYSIS, ROUTINE W REFLEX MICROSCOPIC
Glucose, UA: NEGATIVE mg/dL
Ketones, ur: NEGATIVE mg/dL
Leukocytes,Ua: NEGATIVE
Nitrite: NEGATIVE
Protein, ur: NEGATIVE mg/dL
Specific Gravity, Urine: 1.02 (ref 1.005–1.030)
pH: 7.5 (ref 5.0–8.0)

## 2021-09-24 LAB — CBC
HCT: 38.7 % — ABNORMAL LOW (ref 39.0–52.0)
Hemoglobin: 12.8 g/dL — ABNORMAL LOW (ref 13.0–17.0)
MCH: 33.4 pg (ref 26.0–34.0)
MCHC: 33.1 g/dL (ref 30.0–36.0)
MCV: 101 fL — ABNORMAL HIGH (ref 80.0–100.0)
Platelets: 127 10*3/uL — ABNORMAL LOW (ref 150–400)
RBC: 3.83 MIL/uL — ABNORMAL LOW (ref 4.22–5.81)
RDW: 15.5 % (ref 11.5–15.5)
WBC: 5.3 10*3/uL (ref 4.0–10.5)
nRBC: 0 % (ref 0.0–0.2)

## 2021-09-24 LAB — URINALYSIS, MICROSCOPIC (REFLEX): WBC, UA: NONE SEEN WBC/hpf (ref 0–5)

## 2021-09-24 LAB — LIPASE, BLOOD: Lipase: 44 U/L (ref 11–51)

## 2021-09-24 MED ORDER — SILVER SULFADIAZINE 1 % EX CREA
TOPICAL_CREAM | Freq: Once | CUTANEOUS | Status: AC
  Filled 2021-09-24: qty 85

## 2021-09-24 MED ORDER — ONDANSETRON HCL 4 MG/2ML IJ SOLN
4.0000 mg | Freq: Once | INTRAMUSCULAR | Status: AC
  Administered 2021-09-24: 4 mg via INTRAVENOUS
  Filled 2021-09-24: qty 2

## 2021-09-24 MED ORDER — FUROSEMIDE 10 MG/ML IJ SOLN
20.0000 mg | Freq: Once | INTRAMUSCULAR | Status: AC
  Administered 2021-09-24: 20 mg via INTRAVENOUS
  Filled 2021-09-24: qty 2

## 2021-09-24 MED ORDER — ONDANSETRON 4 MG PO TBDP: 4.0000 mg | ORAL_TABLET | Freq: Three times a day (TID) | ORAL | 0 refills | Status: DC | PRN

## 2021-09-24 NOTE — ED Provider Notes (Signed)
[facility] EMERGENCY DEPARTMENT Provider Note   CSN: [id number] Arrival date & time: 09/24/21  1640     History  Chief Complaint  Patient presents with   Abdominal Pain   Leg Swelling    Trevor Reilly is a 51 y.o. male.  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  Pt sustained a chemical burn to his left lower abdomen and left upper leg while at work on Saturday, 7/29.  He said he spilled the chemical on his shirt and it ate a hole through his shirt.  He put a dressing on it, but it is still there.  Pt had some n/v today.  He also has some swelling to both legs.  He took his lasix today, but is not sure if it stayed down.  Pt did not feel well enough to go to work today.  He did have a paracentesis on 7/21.  I can't tell if he received albumin or not.   He is scheduled for a liver bx next week.        Home Medications Prior to Admission medications   Medication Sig Start Date End Date Taking? Authorizing Provider  ondansetron (ZOFRAN-ODT) 4 MG disintegrating tablet Take 1 tablet (4 mg total) by mouth every 8 (eight) hours as needed for nausea or vomiting. 09/24/21  Yes Trevor Pence, MD  acetaminophen (TYLENOL 8 HOUR) 650 MG CR tablet Take 1 tablet (650 mg total) by mouth every 8 (eight) hours. Patient not taking: Reported on 06/29/2021 04/13/20   Varney Biles, MD  albuterol (VENTOLIN HFA) 108 (90 Base) MCG/ACT inhaler Inhale into the lungs as needed. 07/18/15   [provider]  alum & mag hydroxide-simeth (MAALOX MAX) 400-400-40 MG/5ML suspension Take 10 mLs by mouth every 6 (six) hours as needed for indigestion. 06/15/21   Long, [name], MD  dicyclomine (BENTYL) 20 MG tablet Take 1 tablet (20 mg total) by mouth 3 (three) times daily as needed for spasms. Patient not taking: Reported on 06/29/2021 06/15/21   Margette Fast, MD  fluticasone Asencion Islam) 50 MCG/ACT nasal spray  SMARTSIG:1-2 Spray(s) Both Nares Daily PRN 12/17/20   [provider]  Multiple Vitamins-Minerals (MULTIVITAMIN ADULTS 50+ PO) Take by mouth daily. TAKE ONE DAILY    [provider]  omeprazole (PRILOSEC) 20 MG capsule Take 1 capsule (20 mg total) by mouth daily. Patient not taking: Reported on 06/29/2021 05/07/21   Noralyn Pick, NP  pantoprazole (PROTONIX) 40 MG tablet Take 1 tablet (40 mg total) by mouth daily. 07/16/21   Jackquline Denmark, MD  potassium chloride SA (KLOR-CON M) 20 MEQ tablet Take 1 tablet (20 mEq total) by mouth daily. 07/16/21   Jackquline Denmark, MD  predniSONE (DELTASONE) 20 MG tablet Take 1 tablet daily 08/18/21   Roemhildt, Lorin T, PA-C      Allergies    Patient has no known allergies.    Review of Systems   Review of Systems  Cardiovascular:  Positive for leg swelling.  Skin:        Chemical burn to left lower abd  All other systems reviewed and are negative.   Physical Exam Updated Vital Signs BP 132/87   Pulse 74   Temp 98 F (36.7 C) (Oral)   Resp 19   SpO2 100%  Physical Exam Vitals and nursing note reviewed.  Constitutional:      Appearance: He  is well-developed. He is obese.  HENT:     Head: Normocephalic and atraumatic.     Mouth/Throat:     Mouth: Mucous membranes are moist.     Pharynx: Oropharynx is clear.  Eyes:     Extraocular Movements: Extraocular movements intact.     Pupils: Pupils are equal, round, and reactive to light.  Cardiovascular:     Rate and Rhythm: Normal rate and regular rhythm.     Heart sounds: Normal heart sounds.  Pulmonary:     Effort: Pulmonary effort is normal.     Breath sounds: Normal breath sounds.  Abdominal:     General: Abdomen is flat. Bowel sounds are normal.     Palpations: Abdomen is soft.  Skin:    Capillary Refill: Capillary refill takes less than 2 seconds.     Comments: 10 cm by 10 cm 2nd degree burn to LLQ.  Dime sized 2nd degree burn to left upper thigh  Neurological:      General: No focal deficit present.     Mental Status: He is alert and oriented to person, place, and time.     ED Results / Procedures / Treatments   Labs (all labs ordered are listed, but only abnormal results are displayed) Labs Reviewed  COMPREHENSIVE METABOLIC PANEL - Abnormal; Notable for the following components:      Result Value   Glucose, Bld 119 (*)    BUN 5 (*)    Calcium 7.7 (*)    Albumin 1.6 (*)    AST 120 (*)    Alkaline Phosphatase 168 (*)    Total Bilirubin 4.4 (*)    All other components within normal limits  CBC - Abnormal; Notable for the following components:   RBC 3.83 (*)    Hemoglobin 12.8 (*)    HCT 38.7 (*)    MCV 101.0 (*)    Platelets 127 (*)    All other components within normal limits  URINALYSIS, ROUTINE W REFLEX MICROSCOPIC - Abnormal; Notable for the following components:   Color, Urine AMBER (*)    Hgb urine dipstick SMALL (*)    Bilirubin Urine SMALL (*)    All other components within normal limits  URINALYSIS, MICROSCOPIC (REFLEX) - Abnormal; Notable for the following components:   Bacteria, UA RARE (*)    All other components within normal limits  LIPASE, BLOOD    EKG None  Radiology No results found.  Procedures Procedures    Medications Ordered in ED Medications  silver sulfADIAZINE (SILVADENE) 1 % cream ( Topical Given 09/24/21 2116)  ondansetron (ZOFRAN) injection 4 mg (4 mg Intravenous Given 09/24/21 2150)  furosemide (LASIX) injection 20 mg (20 mg Intravenous Given 09/24/21 2200)    ED Course/ Medical Decision Making/ A&P                           Medical Decision Making Amount and/or Complexity of Data Reviewed Labs: ordered.  Risk Prescription drug management.   This patient presents to the ED for concern of n/v, this involves an extensive number of treatment options, and is a complaint that carries with it a high risk of complications and morbidity.  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   Additional history obtained:  Additional history obtained from epic chart review   Lab  Tests:  I Ordered, and personally interpreted labs.  The pertinent results include:  cbc with hgb 12.8, cmp with albumin low at 1.6, ast 120; ua with +hgb and bili   Cardiac Monitoring:  The patient was maintained on a cardiac monitor.  I personally viewed and interpreted the cardiac monitored which showed an underlying rhythm of: nsr   Medicines ordered and prescription drug management:  I ordered medication including zofran  for nausea, lasix for fluid, silvadene for burn  Reevaluation of the patient after these medicines showed that the patient improved I have reviewed the patients home medicines and have made adjustments as needed   Test Considered:  ct   Critical Interventions:  zofran   Problem List / ED Course:  Chemical burn:  silvadene Edema:  due to cirrhosis and low albumin.  Lasix has helped. N/v:  pt is feeling better after zofran.  He is tolerating po fluids. Hypoalbuminemia:  pt encouraged to increase protein intake.  He is to do the exercises his doctor told him to do at the last visit.   Reevaluation:  After the interventions noted above, I reevaluated the patient and found that they have :improved   Social Determinants of Health:  Lives at home   Dispostion:  After consideration of the diagnostic results and the patients response to treatment, I feel that the patent would benefit from discharge with outpatient f/u.          Final Clinical Impression(s) / ED Diagnoses Final diagnoses:  Peripheral edema  Burn  Hypoalbuminemia  Nausea and vomiting, unspecified vomiting type    Rx / DC Orders ED Discharge Orders          Ordered    ondansetron (ZOFRAN-ODT) 4 MG disintegrating tablet   Every 8 hours PRN        09/24/21 2336              Trevor Pence, MD 09/24/21 2337

## 2021-09-24 NOTE — ED Notes (Signed)
Chemical burn to LLQ of abdomen cleansed and irrigated with normal saline, applied silver sulfadiazine cream to affected area, then covered with non-stick pads and taped.

## 2021-09-24 NOTE — ED Notes (Signed)
ED Provider at bedside. 

## 2021-09-24 NOTE — ED Triage Notes (Signed)
Pt POV c/o stomach swelling and pain, legs, feet swelling.  Also reports emesis starting today. C/o generalized weakness x 1.5 wks.   Also c/o toe pain on both feet.  Pt also reports chemical burn to lower abd that occurred 09/22/21.  Had paracentesis appx one week ago.

## 2021-10-02 ENCOUNTER — Other Ambulatory Visit: Payer: Self-pay | Admitting: Radiology

## 2021-10-02 DIAGNOSIS — K7460 Unspecified cirrhosis of liver: Secondary | ICD-10-CM

## 2021-10-03 ENCOUNTER — Other Ambulatory Visit: Payer: Self-pay

## 2021-10-03 ENCOUNTER — Encounter (HOSPITAL_COMMUNITY): Payer: Self-pay

## 2021-10-03 ENCOUNTER — Ambulatory Visit (HOSPITAL_COMMUNITY)
Admission: RE | Admit: 2021-10-03 | Discharge: 2021-10-03 | Disposition: A | Discharge status: Home / Self Care | Payer: BC Managed Care – PPO | Source: Ambulatory Visit | Attending: Nurse Practitioner | Admitting: Nurse Practitioner

## 2021-10-03 ENCOUNTER — Other Ambulatory Visit (HOSPITAL_COMMUNITY): Payer: Self-pay | Admitting: Nurse Practitioner

## 2021-10-03 DIAGNOSIS — D869 Sarcoidosis, unspecified: Secondary | ICD-10-CM | POA: Insufficient documentation

## 2021-10-03 DIAGNOSIS — K7469 Other cirrhosis of liver: Secondary | ICD-10-CM | POA: Insufficient documentation

## 2021-10-03 DIAGNOSIS — K760 Fatty (change of) liver, not elsewhere classified: Secondary | ICD-10-CM | POA: Diagnosis not present

## 2021-10-03 DIAGNOSIS — R188 Other ascites: Secondary | ICD-10-CM | POA: Diagnosis not present

## 2021-10-03 DIAGNOSIS — K766 Portal hypertension: Secondary | ICD-10-CM | POA: Insufficient documentation

## 2021-10-03 DIAGNOSIS — K7460 Unspecified cirrhosis of liver: Secondary | ICD-10-CM

## 2021-10-03 HISTORY — PX: IR TRANSCATHETER BX: IMG713

## 2021-10-03 HISTORY — PX: IR US GUIDE VASC ACCESS RIGHT: IMG2390

## 2021-10-03 HISTORY — PX: IR VENOGRAM HEPATIC W HEMODYNAMIC EVALUATION: IMG692

## 2021-10-03 LAB — CBC
HCT: 35.9 % — ABNORMAL LOW (ref 39.0–52.0)
Hemoglobin: 12 g/dL — ABNORMAL LOW (ref 13.0–17.0)
MCH: 33.4 pg (ref 26.0–34.0)
MCHC: 33.4 g/dL (ref 30.0–36.0)
MCV: 100 fL (ref 80.0–100.0)
Platelets: 110 10*3/uL — ABNORMAL LOW (ref 150–400)
RBC: 3.59 MIL/uL — ABNORMAL LOW (ref 4.22–5.81)
RDW: 14.1 % (ref 11.5–15.5)
WBC: 4.4 10*3/uL (ref 4.0–10.5)
nRBC: 0 % (ref 0.0–0.2)

## 2021-10-03 LAB — PROTIME-INR
INR: 2.1 — ABNORMAL HIGH (ref 0.8–1.2)
Prothrombin Time: 23.5 seconds — ABNORMAL HIGH (ref 11.4–15.2)

## 2021-10-03 MED ORDER — MIDAZOLAM HCL 2 MG/2ML IJ SOLN
INTRAMUSCULAR | Status: AC
  Filled 2021-10-03: qty 2

## 2021-10-03 MED ORDER — MIDAZOLAM HCL 2 MG/2ML IJ SOLN
INTRAMUSCULAR | Status: AC | PRN
  Administered 2021-10-03 (×2): .5 mg via INTRAVENOUS
  Administered 2021-10-03: 1 mg via INTRAVENOUS

## 2021-10-03 MED ORDER — GELATIN ABSORBABLE 12-7 MM EX MISC
CUTANEOUS | Status: AC
  Filled 2021-10-03: qty 1

## 2021-10-03 MED ORDER — LIDOCAINE HCL 1 % IJ SOLN
INTRAMUSCULAR | Status: AC
  Filled 2021-10-03: qty 20

## 2021-10-03 MED ORDER — FENTANYL CITRATE (PF) 100 MCG/2ML IJ SOLN
INTRAMUSCULAR | Status: AC | PRN
  Administered 2021-10-03 (×2): 25 ug via INTRAVENOUS
  Administered 2021-10-03: 50 ug via INTRAVENOUS

## 2021-10-03 MED ORDER — LIDOCAINE HCL (PF) 1 % IJ SOLN
INTRAMUSCULAR | Status: AC
  Filled 2021-10-03: qty 30

## 2021-10-03 MED ORDER — SODIUM CHLORIDE 0.9 % IV SOLN: INTRAVENOUS | Status: DC

## 2021-10-03 MED ORDER — FENTANYL CITRATE (PF) 100 MCG/2ML IJ SOLN
INTRAMUSCULAR | Status: AC
  Filled 2021-10-03: qty 2

## 2021-10-03 MED ORDER — IOHEXOL 300 MG/ML  SOLN
100.0000 mL | Freq: Once | INTRAMUSCULAR | Status: AC | PRN
  Administered 2021-10-03: 15 mL via INTRAVENOUS

## 2021-10-03 NOTE — Progress Notes (Signed)
After procedure was aborted in ultrasound due to free fluid in abdomen, this RN went to return versed and fentanyl in the pyxis. Medications were scanned in however versed drawer did not open while the fentanyl did. This RN closed the window and re-accessed the pyxis. Current versed count was 12 with no discrepancy, however when pulled initially the count was 12. No discrepancy was created at this time however this RN still had full vial on versed and unable to waste or return. Had another RN pull history of drawer with inability to waste or return. Pharmacy was called and versed was returned in person to [facility].

## 2021-10-03 NOTE — Sedation Documentation (Signed)
Wedge hepatic pressure mean (23)

## 2021-10-03 NOTE — Progress Notes (Signed)
Procedure on hold at this time until 3 pm. Notable free fluid in abdomen per [place] scan. Patient will be done in IR for transjugular approach

## 2021-10-03 NOTE — Procedures (Signed)
Interventional Radiology Procedure Note  Procedure: TJLBX WITH HEPATIC VENOGRAM AND PRESSURES    Complications: None  Estimated Blood Loss:  MIN  Findings: FULL REPORT IN PACS     Tamera Punt, MD

## 2021-10-03 NOTE — Sedation Documentation (Signed)
Patient in IR at this time for transjugular liver bx

## 2021-10-03 NOTE — H&P (Signed)
Chief Complaint: Patient was seen in consultation today for random liver biopsy   Referring Physician(s): Drazek,Dawn  Supervising Physician: Daryll Brod  Patient Status: [facility] - Out-pt  History of Present Illness: Trevor Reilly is a 51 y.o. male being worked up for elevated liver enzymes. Underlying hx of fatty liver disease and sarcoidosis. He is referred for image guided random liver biopsy. PMHx, meds, labs, imaging, allergies reviewed. Feels well, no recent fevers, chills, illness. Has been NPO today as directed.   Past Medical History:  Diagnosis Date   Allergy    SEASONAL   Chronic cough    05-22-2020 per pt morning productive with yellow sputum   Depression    ED (erectile dysfunction)    Epididymal cyst    left   Fatty liver    05-22-2020  per pt has been referred to GI,  last ultrasound in epic 06-10-2016   GERD (gastroesophageal reflux disease)    History of community acquired pneumonia 04/13/2020   ED visit in epic   History of rectal bleeding    Hypertension    05-22-2020 followed by new pcp--- no medication currently   Mild obstructive sleep apnea    05-22-2020  per pt had study approx. 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  Medications: Prior to Admission medications   Medication Sig Start Date End Date Taking? Authorizing Provider  acetaminophen (TYLENOL) 500 MG tablet Take 1,500 mg by mouth every 6 (six) hours as needed for mild pain or moderate pain.   Yes [provider]  furosemide (LASIX) 20 MG tablet Take 20 mg by mouth 2 (two) times daily. 09/14/21  Yes [provider]  ondansetron (ZOFRAN-ODT) 4 MG disintegrating tablet Take 1 tablet (4 mg total) by mouth every 8 (eight) hours as needed for nausea or vomiting. 09/24/21  Yes Isla Pence, MD  silver sulfADIAZINE (SILVADENE) 1 % cream Apply 1 Application topically daily.   Yes [provider]  spironolactone (ALDACTONE) 50 MG tablet Take 50 mg by mouth daily. 09/14/21  Yes [provider]  albuterol (VENTOLIN HFA) 108 (90 Base) MCG/ACT inhaler Inhale 2 puffs into the lungs every 6 (six) hours as needed for shortness of breath. 07/18/15   [provider]  alum & mag hydroxide-simeth (MAALOX MAX) 400-400-40 MG/5ML suspension Take 10 mLs by mouth every 6 (six) hours as needed for indigestion. Patient not taking: Reported on 10/01/2021 06/15/21  Long, [name], MD  cyclobenzaprine (FLEXERIL) 5 MG tablet Take 5 mg by mouth 3 (three) times daily as needed for muscle spasms.    [provider]  Multiple Vitamins-Minerals (MULTIVITAMIN ADULTS 50+ PO) Take 1 tablet by mouth daily.    [provider]  omeprazole (PRILOSEC) 20 MG capsule Take 20 mg by mouth daily as needed (Heartburn).    [provider]  pantoprazole (PROTONIX) 40 MG tablet Take 1 tablet (40 mg total) by mouth daily. Patient not taking: Reported on 10/01/2021 07/16/21   Jackquline Denmark, MD  potassium chloride SA (KLOR-CON M) 20 MEQ tablet Take  1 tablet (20 mEq total) by mouth daily. Patient not taking: Reported on 10/01/2021 07/16/21   Jackquline Denmark, MD  triamcinolone cream (KENALOG) 0.1 % Apply 1 Application topically daily as needed (Hives). 08/23/21   [provider]     Family History  Problem Relation Age of Onset   Gout Mother    Hypertension Mother    Thyroid disease Mother    Stroke Father    Heart attack Father    Thyroid disease Sister    Hypertension Brother    Lung cancer Brother    Colon cancer Maternal Grandmother 46   Sarcoidosis Cousin    Crohn's disease Neg Hx    Esophageal cancer Neg Hx    Rectal cancer Neg Hx    Stomach cancer Neg Hx     Social History   Socioeconomic History   Marital status: Single    Spouse name: Not on file   Number of children: Not on file   Years of education: Not on file   Highest education level: Not on file  Occupational History   Not on file  Tobacco Use   Smoking status: Former    Packs/day: 0.50    Years: 25.00    Total pack years: 12.50    Types: Cigarettes    Quit date: 04/25/2019    Years since quitting: 2.4   Smokeless tobacco: Never  Vaping Use   Vaping Use: Never used  Substance and Sexual Activity   Alcohol use: Yes    Alcohol/week: 2.0 standard drinks of alcohol    Types: 2 Standard drinks or equivalent per week    Comment: weekends 24 beers a week   Drug use: Never   Sexual activity: Not on file  Other Topics Concern   Not on file  Social History Narrative   Not on file   Social Determinants of Health   Financial Resource Strain: Not on file  Food Insecurity: Not on file  Transportation Needs: Not on file  Physical Activity: Not on file  Stress: Not on file  Social Connections: Not on file    Review of Systems: A 12 point ROS discussed and pertinent positives are indicated in the HPI above.  All other systems are negative.  Review of Systems  Vital Signs: BP 131/83   Pulse 88   Resp 18   Ht '5\' 9"'$  (1.753 m)   Wt 95.3 kg    SpO2 100%   BMI 31.01 kg/m   Physical Exam Constitutional:      Appearance: Normal appearance. He is not ill-appearing.  HENT:     Mouth/Throat:     Mouth: Mucous membranes are moist.     Pharynx: Oropharynx is clear.  Cardiovascular:     Rate and Rhythm: Normal rate and regular rhythm.     Heart sounds: Normal heart sounds.  Pulmonary:  Effort: Pulmonary effort is normal. No respiratory distress.     Breath sounds: Normal breath sounds.  Abdominal:     General: Abdomen is flat. There is no distension.     Palpations: Abdomen is soft.     Tenderness: There is no abdominal tenderness.  Skin:    General: Skin is warm and dry.  Neurological:     General: No focal deficit present.     Mental Status: He is alert and oriented to person, place, and time.  Psychiatric:        Mood and Affect: Mood normal.        Thought Content: Thought content normal.     Imaging: IR Paracentesis  Result Date: 09/14/2021 INDICATION: Cirrhosis with new onset ascites. Request for diagnostic and therapeutic paracentesis. EXAM: ULTRASOUND GUIDED RIGHT LOWER QUADRANT PARACENTESIS MEDICATIONS: 1% plain lidocaine, 5 mL COMPLICATIONS: None immediate. PROCEDURE: Informed written consent was obtained from the patient after a discussion of the risks, benefits and alternatives to treatment. A timeout was performed prior to the initiation of the procedure. Initial ultrasound scanning demonstrates a large amount of ascites within the right lower abdominal quadrant. The right lower abdomen was prepped and draped in the usual sterile fashion. 1% lidocaine was used for local anesthesia. Following this, a 19 gauge, 10-cm, Yueh catheter was introduced. An ultrasound image was saved for documentation purposes. The paracentesis was performed. The catheter was removed and a dressing was applied. The patient tolerated the procedure well without immediate post procedural complication. FINDINGS: A total of approximately 4.3 L  of clear yellow fluid was removed. Samples were sent to the laboratory as requested by the clinical team. IMPRESSION: Successful ultrasound-guided paracentesis yielding 4.3 liters of peritoneal fluid. PLAN: If the patient eventually requires >/=2 paracenteses in a 30 day period, candidacy for formal evaluation by the [facility] will be assessed. Read by: Ascencion Dike PA-C Electronically Signed   By: Jerilynn Mages.  Shick M.D.   On: 09/14/2021 10:45    Labs:  CBC: Recent Labs    06/15/21 0615 07/13/21 1559 08/18/21 2057 09/24/21 1845  WBC 7.0 4.9 7.0 5.3  HGB 12.6* 12.2* 11.3* 12.8*  HCT 36.3* 36.0* 33.1* 38.7*  PLT 126* 107.0* 97* 127*    COAGS: Recent Labs    05/07/21 0954 05/14/21 1235 07/13/21 1559  INR 1.6* 1.6* 2.1*    BMP: Recent Labs    01/11/21 1054 05/07/21 0954 06/15/21 0615 07/13/21 1559 08/18/21 1945 09/24/21 1845  NA 133*   < > 137 135 131* 135  K 3.5   < > 3.4* 3.3* 3.4* 4.6  CL 101   < > 106 102 103 102  CO2 23   < > '25 27 23 27  '$ GLUCOSE 108*   < > 97 100* 126* 119*  BUN 6   < > <5* 4* <5* 5*  CALCIUM 8.1*   < > 7.9* 7.6* 7.5* 7.7*  CREATININE 0.70   < > 0.69 0.81 0.88 1.08  GFRNONAA >60  --  >60  --  >60 >60   < > = values in this interval not displayed.    LIVER FUNCTION TESTS: Recent Labs    06/15/21 0615 07/13/21 1559 08/18/21 1945 09/24/21 1845  BILITOT 7.6* 6.1* 5.0* 4.4*  AST 139* 87* 96* 120*  ALT '24 19 25 '$ 34  ALKPHOS 175* 152* 134* 168*  PROT 7.6 7.5 7.2 7.6  ALBUMIN 1.9* 2.0* 1.6* 1.6*     Assessment and Plan:  Elevated liver enzymes For image guided random liver biopsy Labs reviewed. Risks and benefits of liver bx was discussed with the patient and/or patient's family including, but not limited to bleeding, infection, damage to adjacent structures or low yield requiring additional tests.  All of the questions were answered and there is agreement to proceed.  Consent signed and in  chart.    Electronically Signed: Ascencion Dike, PA-C 10/03/2021, 11:54 AM   I spent a total of 20 in face to face in clinical consultation, greater than 50% of which was counseling/coordinating care for liver biopsy

## 2021-10-03 NOTE — Sedation Documentation (Signed)
Hepatic pressure  mean (3)

## 2021-10-03 NOTE — Progress Notes (Signed)
Spoke with Ascencion Dike, Rad PA-states pt mat RTW tomorrow

## 2021-10-05 LAB — SURGICAL PATHOLOGY

## 2021-10-09 DIAGNOSIS — R791 Abnormal coagulation profile: Secondary | ICD-10-CM | POA: Diagnosis not present

## 2021-10-09 DIAGNOSIS — Z1339 Encounter for screening examination for other mental health and behavioral disorders: Secondary | ICD-10-CM | POA: Diagnosis not present

## 2021-10-09 DIAGNOSIS — R188 Other ascites: Secondary | ICD-10-CM | POA: Diagnosis not present

## 2021-10-09 DIAGNOSIS — F1010 Alcohol abuse, uncomplicated: Secondary | ICD-10-CM | POA: Diagnosis not present

## 2021-10-09 DIAGNOSIS — R799 Abnormal finding of blood chemistry, unspecified: Secondary | ICD-10-CM | POA: Diagnosis not present

## 2021-10-09 DIAGNOSIS — R7989 Other specified abnormal findings of blood chemistry: Secondary | ICD-10-CM | POA: Diagnosis not present

## 2021-10-09 DIAGNOSIS — K7031 Alcoholic cirrhosis of liver with ascites: Secondary | ICD-10-CM | POA: Diagnosis not present

## 2021-10-09 DIAGNOSIS — K7682 Hepatic encephalopathy: Secondary | ICD-10-CM | POA: Diagnosis not present

## 2021-10-09 DIAGNOSIS — K766 Portal hypertension: Secondary | ICD-10-CM | POA: Diagnosis not present

## 2021-11-26 ENCOUNTER — Telehealth: Payer: Self-pay

## 2021-11-26 NOTE — Telephone Encounter (Signed)
Personal reminder received:  Pt was scheduled for an RUQ [place] on  at [facility]  on 11/30/2021 at 10:00 AM: Pt to check in at 9:45 AM: Nothing to eat or drink past midnight: Pt made aware: Pt verbalized understanding with all questions answered.

## 2021-11-26 NOTE — Telephone Encounter (Signed)
-----   Message from Aleatha Borer, LPN sent at 1/61/0960 12:10 PM EDT ----- Regarding: FW: RUQ [place] - DUE ~ OCTOBER 2023 Expected ~ Oct 2023. Did pt get scheduled? ----- Message ----- From: Aleatha Borer, LPN Sent: 05/30/4096  11:91 AM EDT To: April H Pait, Roosvelt Maser, # Subject: RUQ [place] - DUE ~ OCTOBER 2023                    [facility] Phone: [phone] Fax: [phone]   Imaging Ordered: RUQ [place] - NEEDED ~ OCT 2023  Diagnosis: Liver disease, protal HTN  Ordering Provider: Carl Best, CRNP  Is a Prior Authorization needed? We are in the process of obtaining it now  Thank you for your assistance! [facility]

## 2021-11-26 NOTE — Telephone Encounter (Signed)
Personal reminder received:  Pt was scheduled for an RUQ [place] on  at [facility]  on 11/30/2021 at 10:00 AM: Pt to check in at 9:45 AM: Nothing to eat or drink past midnight: Left message for pt to call back

## 2021-11-30 ENCOUNTER — Ambulatory Visit (HOSPITAL_COMMUNITY): Payer: BC Managed Care – PPO

## 2021-12-02 ENCOUNTER — Ambulatory Visit (HOSPITAL_COMMUNITY): Admission: RE | Admit: 2021-12-02 | Payer: BC Managed Care – PPO | Source: Ambulatory Visit

## 2021-12-09 ENCOUNTER — Ambulatory Visit (HOSPITAL_COMMUNITY)
Admission: RE | Admit: 2021-12-09 | Discharge: 2021-12-09 | Disposition: A | Discharge status: Home / Self Care | Payer: BC Managed Care – PPO | Source: Ambulatory Visit | Attending: Nurse Practitioner | Admitting: Nurse Practitioner

## 2021-12-09 DIAGNOSIS — K769 Liver disease, unspecified: Secondary | ICD-10-CM | POA: Diagnosis not present

## 2021-12-09 DIAGNOSIS — K766 Portal hypertension: Secondary | ICD-10-CM | POA: Diagnosis not present

## 2021-12-09 DIAGNOSIS — I10 Essential (primary) hypertension: Secondary | ICD-10-CM | POA: Diagnosis not present

## 2021-12-09 DIAGNOSIS — R188 Other ascites: Secondary | ICD-10-CM | POA: Diagnosis not present

## 2021-12-18 ENCOUNTER — Other Ambulatory Visit (HOSPITAL_COMMUNITY): Payer: Self-pay | Admitting: Nurse Practitioner

## 2021-12-18 DIAGNOSIS — R945 Abnormal results of liver function studies: Secondary | ICD-10-CM | POA: Diagnosis not present

## 2021-12-18 DIAGNOSIS — K7682 Hepatic encephalopathy: Secondary | ICD-10-CM

## 2021-12-18 DIAGNOSIS — F1010 Alcohol abuse, uncomplicated: Secondary | ICD-10-CM | POA: Diagnosis not present

## 2021-12-18 DIAGNOSIS — K766 Portal hypertension: Secondary | ICD-10-CM | POA: Diagnosis not present

## 2021-12-18 DIAGNOSIS — Z1339 Encounter for screening examination for other mental health and behavioral disorders: Secondary | ICD-10-CM | POA: Diagnosis not present

## 2021-12-18 DIAGNOSIS — K7031 Alcoholic cirrhosis of liver with ascites: Secondary | ICD-10-CM

## 2021-12-18 DIAGNOSIS — R791 Abnormal coagulation profile: Secondary | ICD-10-CM | POA: Diagnosis not present

## 2021-12-18 DIAGNOSIS — R7989 Other specified abnormal findings of blood chemistry: Secondary | ICD-10-CM | POA: Diagnosis not present

## 2021-12-18 DIAGNOSIS — R188 Other ascites: Secondary | ICD-10-CM

## 2021-12-18 DIAGNOSIS — K3189 Other diseases of stomach and duodenum: Secondary | ICD-10-CM

## 2021-12-19 ENCOUNTER — Ambulatory Visit: Payer: BC Managed Care – PPO | Admitting: Pulmonary Disease

## 2021-12-28 ENCOUNTER — Ambulatory Visit (HOSPITAL_COMMUNITY)
Admission: RE | Admit: 2021-12-28 | Discharge: 2021-12-28 | Disposition: A | Discharge status: Home / Self Care | Payer: BC Managed Care – PPO | Source: Ambulatory Visit | Attending: Nurse Practitioner | Admitting: Nurse Practitioner

## 2021-12-28 ENCOUNTER — Other Ambulatory Visit (HOSPITAL_COMMUNITY): Payer: Self-pay | Admitting: Nurse Practitioner

## 2021-12-28 DIAGNOSIS — K7031 Alcoholic cirrhosis of liver with ascites: Secondary | ICD-10-CM

## 2021-12-28 DIAGNOSIS — R188 Other ascites: Secondary | ICD-10-CM | POA: Diagnosis not present

## 2021-12-28 DIAGNOSIS — K7682 Hepatic encephalopathy: Secondary | ICD-10-CM

## 2021-12-28 DIAGNOSIS — K3189 Other diseases of stomach and duodenum: Secondary | ICD-10-CM

## 2022-01-06 ENCOUNTER — Encounter (HOSPITAL_BASED_OUTPATIENT_CLINIC_OR_DEPARTMENT_OTHER): Payer: Self-pay | Admitting: Pediatrics

## 2022-01-06 ENCOUNTER — Other Ambulatory Visit: Payer: Self-pay

## 2022-01-06 ENCOUNTER — Emergency Department (HOSPITAL_BASED_OUTPATIENT_CLINIC_OR_DEPARTMENT_OTHER)
Admission: EM | Admit: 2022-01-06 | Discharge: 2022-01-06 | Disposition: A | Discharge status: Home / Self Care | Payer: BC Managed Care – PPO | Attending: Emergency Medicine | Admitting: Emergency Medicine

## 2022-01-06 DIAGNOSIS — R197 Diarrhea, unspecified: Secondary | ICD-10-CM | POA: Diagnosis not present

## 2022-01-06 DIAGNOSIS — Z79899 Other long term (current) drug therapy: Secondary | ICD-10-CM | POA: Diagnosis not present

## 2022-01-06 DIAGNOSIS — R600 Localized edema: Secondary | ICD-10-CM | POA: Diagnosis not present

## 2022-01-06 DIAGNOSIS — R1084 Generalized abdominal pain: Secondary | ICD-10-CM | POA: Diagnosis not present

## 2022-01-06 DIAGNOSIS — R109 Unspecified abdominal pain: Secondary | ICD-10-CM | POA: Diagnosis not present

## 2022-01-06 LAB — COMPREHENSIVE METABOLIC PANEL
ALT: 47 U/L — ABNORMAL HIGH (ref 0–44)
AST: 164 U/L — ABNORMAL HIGH (ref 15–41)
Albumin: 1.7 g/dL — ABNORMAL LOW (ref 3.5–5.0)
Alkaline Phosphatase: 187 U/L — ABNORMAL HIGH (ref 38–126)
Anion gap: 3 — ABNORMAL LOW (ref 5–15)
BUN: 9 mg/dL (ref 6–20)
CO2: 25 mmol/L (ref 22–32)
Calcium: 7.5 mg/dL — ABNORMAL LOW (ref 8.9–10.3)
Chloride: 103 mmol/L (ref 98–111)
Creatinine, Ser: 0.86 mg/dL (ref 0.61–1.24)
GFR, Estimated: >60 mL/min (ref >60)
Glucose, Bld: 113 mg/dL — ABNORMAL HIGH (ref 70–99)
Potassium: 4 mmol/L (ref 3.5–5.1)
Sodium: 131 mmol/L — ABNORMAL LOW (ref 135–145)
Total Bilirubin: 6.3 mg/dL — ABNORMAL HIGH (ref 0.3–1.2)
Total Protein: 5.7 g/dL — ABNORMAL LOW (ref 6.5–8.1)

## 2022-01-06 LAB — CBC WITH DIFFERENTIAL/PLATELET
Abs Immature Granulocytes: 0.01 10*3/uL (ref 0.00–0.07)
Basophils Absolute: 0.1 10*3/uL (ref 0.0–0.1)
Basophils Relative: 2 %
Eosinophils Absolute: 0.4 10*3/uL (ref 0.0–0.5)
Eosinophils Relative: 8 %
HCT: 34.3 % — ABNORMAL LOW (ref 39.0–52.0)
Hemoglobin: 11.3 g/dL — ABNORMAL LOW (ref 13.0–17.0)
Immature Granulocytes: 0 %
Lymphocytes Relative: 28 %
Lymphs Abs: 1.6 10*3/uL (ref 0.7–4.0)
MCH: 32.9 pg (ref 26.0–34.0)
MCHC: 32.9 g/dL (ref 30.0–36.0)
MCV: 100 fL (ref 80.0–100.0)
Monocytes Absolute: 0.4 10*3/uL (ref 0.1–1.0)
Monocytes Relative: 8 %
Neutro Abs: 3 10*3/uL (ref 1.7–7.7)
Neutrophils Relative %: 54 %
Platelets: 104 10*3/uL — ABNORMAL LOW (ref 150–400)
RBC: 3.43 MIL/uL — ABNORMAL LOW (ref 4.22–5.81)
RDW: 16.2 % — ABNORMAL HIGH (ref 11.5–15.5)
WBC: 5.5 10*3/uL (ref 4.0–10.5)
nRBC: 0 % (ref 0.0–0.2)

## 2022-01-06 LAB — LIPASE, BLOOD: Lipase: 40 U/L (ref 11–51)

## 2022-01-06 MED ORDER — LIDOCAINE 5 % EX PTCH
1.0000 | MEDICATED_PATCH | CUTANEOUS | Status: DC
  Administered 2022-01-06: 1 via TRANSDERMAL
  Filled 2022-01-06: qty 1

## 2022-01-06 MED ORDER — FENTANYL CITRATE PF 50 MCG/ML IJ SOSY
50.0000 ug | PREFILLED_SYRINGE | Freq: Once | INTRAMUSCULAR | Status: AC
  Administered 2022-01-06: 50 ug via INTRAVENOUS
  Filled 2022-01-06: qty 1

## 2022-01-06 MED ORDER — ONDANSETRON HCL 4 MG/2ML IJ SOLN
4.0000 mg | Freq: Once | INTRAMUSCULAR | Status: AC
  Administered 2022-01-06: 4 mg via INTRAVENOUS
  Filled 2022-01-06: qty 2

## 2022-01-06 NOTE — ED Triage Notes (Signed)
C/O abdominal pain x 2 days; reported hx of cirrhosis and was seen at [facility] radiology recently, stated does not have much fluid for drainage.

## 2022-01-06 NOTE — ED Notes (Signed)
Patient reports abd pain w/diarrhea x2days, denies N/V.

## 2022-01-06 NOTE — ED Notes (Signed)
ED Provider at bedside. 

## 2022-01-06 NOTE — ED Provider Notes (Signed)
[facility] EMERGENCY DEPARTMENT Provider Note   CSN: [id number] Arrival date & time: 01/06/22  1102     History  Chief Complaint  Patient presents with   Abdominal Pain    Trevor Reilly is a 51 y.o. male.  Patient here with abdominal pain, diarrhea for the last 2 days.  Denies any nausea or vomiting.  History of cirrhosis.  He is on diuretics.  Has been compliant with this medicine.  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  He is no longer drinking.  He gets paracentesis every so often.  He had ultrasound done last week with radiology team but did not have enough fluid to be drained.  He denies any chest pain or shortness of breath.  He has acute on chronic pain at times.  Right now is not having a ton of abdominal pain.  He was just curious if he had enough fluid to be taken off today.  The history is provided by the patient.       Home Medications Prior to Admission medications   Medication Sig Start Date End Date Taking? Authorizing Provider  furosemide (LASIX) 20 MG tablet Take 20 mg by mouth 2 (two) times daily. 09/14/21  Yes [provider]  acetaminophen (TYLENOL) 500 MG tablet Take 1,500 mg by mouth every 6 (six) hours as needed for mild pain or moderate pain.    [provider]  albuterol (VENTOLIN HFA) 108 (90 Base) MCG/ACT inhaler Inhale 2 puffs into the lungs every 6 (six) hours as needed for shortness of breath. 07/18/15   [provider]  alum & mag hydroxide-simeth (MAALOX MAX) 400-400-40 MG/5ML suspension Take 10 mLs by mouth every 6 (six) hours as needed for indigestion. Patient not taking: Reported on 10/01/2021 06/15/21   Long, [name], MD  cyclobenzaprine (FLEXERIL) 5 MG tablet Take 5 mg by mouth 3 (three) times daily as needed for muscle spasms.    [provider]  Multiple Vitamins-Minerals (MULTIVITAMIN ADULTS 50+ PO) Take 1 tablet by mouth daily.    [provider]  omeprazole (PRILOSEC) 20 MG capsule  Take 20 mg by mouth daily as needed (Heartburn).    [provider]  ondansetron (ZOFRAN-ODT) 4 MG disintegrating tablet Take 1 tablet (4 mg total) by mouth every 8 (eight) hours as needed for nausea or vomiting. 09/24/21   Isla Pence, MD  pantoprazole (PROTONIX) 40 MG tablet Take 1 tablet (40 mg total) by mouth daily. Patient not taking: Reported on 10/01/2021 07/16/21   Jackquline Denmark, MD  potassium chloride SA (KLOR-CON M) 20 MEQ tablet Take 1 tablet (20 mEq total) by mouth daily. Patient not taking: Reported on 10/01/2021 07/16/21   Jackquline Denmark, MD  silver sulfADIAZINE (SILVADENE) 1 % cream Apply 1 Application topically daily.    [provider]  spironolactone (ALDACTONE) 50 MG tablet Take 50 mg by mouth daily. 09/14/21   [provider]  triamcinolone cream (KENALOG) 0.1 % Apply 1 Application topically daily as needed (Hives). 08/23/21   [provider]      Allergies    Patient has no known allergies.    Review of Systems   Review of Systems  Physical Exam Updated Vital Signs BP 130/78 (BP Location: Right Arm)   Pulse 95   Temp 98.3 F (36.8 C) (Oral)   Resp 18   Ht '5\' 9"'$  (1.753 m)   Wt 95.3 kg   SpO2 97%   BMI 31.01 kg/m  Physical Exam  Vitals and nursing note reviewed.  Constitutional:      General: He is not in acute distress.    Appearance: He is well-developed. He is not ill-appearing.  HENT:     Head: Normocephalic and atraumatic.     Mouth/Throat:     Mouth: Mucous membranes are moist.  Eyes:     Extraocular Movements: Extraocular movements intact.     Conjunctiva/sclera: Conjunctivae normal.  Cardiovascular:     Rate and Rhythm: Normal rate and regular rhythm.     Heart sounds: Normal heart sounds. No murmur heard. Pulmonary:     Effort: Pulmonary effort is normal. No respiratory distress.     Breath sounds: Normal breath sounds.  Abdominal:     General: Abdomen is flat.     Palpations: Abdomen is soft.     Tenderness:  There is no abdominal tenderness. There is no guarding or rebound. Negative signs include Murphy's sign.  Musculoskeletal:        General: No swelling.     Cervical back: Neck supple.  Skin:    General: Skin is warm and dry.     Capillary Refill: Capillary refill takes less than 2 seconds.     Comments: Mild pitting edema bilaterally  Neurological:     Mental Status: He is alert.  Psychiatric:        Mood and Affect: Mood normal.     ED Results / Procedures / Treatments   Labs (all labs ordered are listed, but only abnormal results are displayed) Labs Reviewed  CBC WITH DIFFERENTIAL/PLATELET - Abnormal; Notable for the following components:      Result Value   RBC 3.43 (*)    Hemoglobin 11.3 (*)    HCT 34.3 (*)    RDW 16.2 (*)    Platelets 104 (*)    All other components within normal limits  COMPREHENSIVE METABOLIC PANEL - Abnormal; Notable for the following components:   Sodium 131 (*)    Glucose, Bld 113 (*)    Calcium 7.5 (*)    Total Protein 5.7 (*)    Albumin 1.7 (*)    AST 164 (*)    ALT 47 (*)    Alkaline Phosphatase 187 (*)    Total Bilirubin 6.3 (*)    Anion gap 3 (*)    All other components within normal limits  LIPASE, BLOOD    EKG None  Radiology No results found.  Procedures Procedures    Medications Ordered in ED Medications  lidocaine (LIDODERM) 5 % 1 patch (1 patch Transdermal Patch Applied 01/06/22 1149)  fentaNYL (SUBLIMAZE) injection 50 mcg (50 mcg Intravenous Given 01/06/22 1150)  ondansetron (ZOFRAN) injection 4 mg (4 mg Intravenous Given 01/06/22 1148)    ED Course/ Medical Decision Making/ A&P                           Medical Decision Making Amount and/or Complexity of Data Reviewed Labs: ordered.  Risk Prescription drug management.   Trevor Reilly is here with diarrhea.  May be some abdominal cramping.  History of cirrhosis thought to be from alcohol.  Normal vitals.  No abdominal discomfort on exam.  I did a quick  bedside ultrasound did not see any major ascites.  He had an ultrasound done last week with radiology that also showed no major ascites.  He does not get paracentesis frequently.  Overall appears well.  Suspect may be a viral process.  Have  no concern for bowel obstruction or acute intra-abdominal process at this time.  He thought maybe he had built up enough fluid to have removed but at this time I do not think it is worth getting paracentesis as I think he has less than half a liter of fluid.  He has no fever.  Have no concern for SBP.  We will check basic labs to make sure that nothing is changed blood work wise.  We will give him Zofran, lidocaine patch, fentanyl and reevaluate.  Per my review and interpretation of labs, labs are baseline.  There is no significant leukocytosis.  Overall he is feeling better.  Recommend seeing primary care doctor for further chronic pain management.  We will have him follow-up next week to see if there is enough fluid for paracentesis but at this time I do not think that it is medically needed.  Patient hereThis chart was dictated using voice recognition software.  Despite best efforts to proofread,  errors can occur which can change the documentation meaning.         Final Clinical Impression(s) / ED Diagnoses Final diagnoses:  Generalized abdominal pain    Rx / DC Orders ED Discharge Orders     None         Lennice Sites, DO 01/06/22 1233

## 2022-01-09 ENCOUNTER — Encounter: Payer: Self-pay | Admitting: Pulmonary Disease

## 2022-01-09 ENCOUNTER — Ambulatory Visit (INDEPENDENT_AMBULATORY_CARE_PROVIDER_SITE_OTHER): Payer: BC Managed Care – PPO | Admitting: Pulmonary Disease

## 2022-01-09 VITALS — BP 122/68 | HR 93 | Temp 98.2°F | Ht 69.0 in | Wt 226.4 lb

## 2022-01-09 DIAGNOSIS — R053 Chronic cough: Secondary | ICD-10-CM | POA: Diagnosis not present

## 2022-01-09 DIAGNOSIS — K7031 Alcoholic cirrhosis of liver with ascites: Secondary | ICD-10-CM

## 2022-01-09 DIAGNOSIS — D869 Sarcoidosis, unspecified: Secondary | ICD-10-CM

## 2022-01-09 MED ORDER — AMOXICILLIN-POT CLAVULANATE 875-125 MG PO TABS: 1.0000 | ORAL_TABLET | Freq: Two times a day (BID) | ORAL | 0 refills | Status: AC

## 2022-01-09 MED ORDER — FLUTICASONE PROPIONATE 50 MCG/ACT NA SUSP: 1.0000 | Freq: Two times a day (BID) | NASAL | 11 refills | Status: AC

## 2022-01-09 NOTE — Progress Notes (Signed)
$'@Patient'J$  ID: Trevor Reilly, male    DOB: 15-Jan-1971, 51 y.o.   MRN: [id number]  Chief Complaint  Patient presents with   Follow-up    Follow up. Pt states that he is having headaches and when his allergies act up he is having blood in his mucus when he blows his nose. Pt tried Claritin and zyrtec but it did not help. No pulmonary meds noted. Went to ER on Sunday per patient.     Referring provider: Delford Field, FNP  HPI:   51 y.o. man whom we are seeing in consultation for evaluation of sarcoidosis.  Most recent GI/hepatology note x3 reviewed.  ED note 01/06/2022 reviewed.  Overall, breathing stable.  No real of dyspnea on exertion.    At last visit, cough was felt to be mainly due to postnasal drip.  Treated with Flonase and sinus rinses.  Cough markedly improved.  He stopped doing this.  Over the last 2 weeks nasal congestion has returned.  Some bloody nose when he blows his nose.  A lot of sinus congestion with headache.  Postnasal drip and cough has returned.  He is taking antihistamine but not doing the nasal regimen as we last discussed.  He is run out of medications.  Reviewed results of biopsy in the interim of his liver.  This shows end-stage cirrhosis.  Do not see any description of granulomatous inflammation or signs of sarcoid.  Recent long-term alcohol use test 12/18/2021 positive.  At time of last transplant visit.  HPI at initial visit: Patient overall doing well.  Denies any respiratory symptoms.  No shortness of breath, dyspnea.  Diagnosed with sarcoidosis per report 2007.  Via endobronchial biopsy presumably of lymph node.  Was placed on steroids at that time.  Unclear dose.  Was on this for about 10 years.  He was lost to follow-up to prior pulmonologist at [facility].  Has been off prednisone for 4 years.  No development of respiratory symptoms as above.  His primary complaint is cough.  Productive of phlegm.  Associate with significant sinus congestion, nasal  congestion, rhinorrhea.  He is unsure if he feels sensation of phlegm dripping on the back of his throat from his nasal passages.  Zyrtec helps some.  Flonase not very helpful per his report.  No other relieving or exacerbating factors that he can identify.  Cough present for many many years.  Reviewed serial chest x-rays 3 from 06/2016 to 03/2020 that on My review interpretation reveals clear lungs without significant changes over the last 4 years.  PMH: Sarcoidosis, seasonal allergies, GERD Surgical history: Liver biopsy, epididymal cyst removal Family history: Cousin and uncle with sarcoidosis, mother with hypertension, father with CVA, CAD Social history: Former smoker, quit 2021, lives in [place], originally from [place] / Pulmonary Flowsheets:   ACT:      No data to display          MMRC:     No data to display          Epworth:      No data to display          Tests:   FENO:  No results found for: "NITRICOXIDE"  PFT:     No data to display          WALK:      No data to display          Imaging: Personally reviewed as per EMR discussion  this note IR ABDOMEN US LIMITED  Result Date: 12/28/2021 CLINICAL DATA:  History of cirrhosis now with concern for intra-abdominal ascites. Please perform ascites search ultrasound and ultrasound-guided paracentesis as indicated. EXAM: LIMITED ABDOMEN ULTRASOUND FOR ASCITES TECHNIQUE: Limited ultrasound survey for ascites was performed in all four abdominal quadrants. COMPARISON:  Ultrasound-guided paracentesis-09/14/2021 (yielding 4.3 L of peritoneal fluid) FINDINGS: Sonographic evaluation demonstrates a trace amount of intra-abdominal ascites, too small to allow for safe ultrasound-guided paracentesis. No paracentesis attempted. IMPRESSION: Trace amount of intra-abdominal ascites, too small to allow for safe ultrasound-guided paracentesis. No paracentesis attempted. Note, upon arrival to  the interventional radiology department, the patient complained of bright red blood per rectum and as such was escorted to the emergency department for further evaluation and management. Electronically Signed   By: Sandi Mariscal M.D.   On: 12/28/2021 10:04    Lab Results: Personally reviewed CBC    Component Value Date/Time   WBC 5.5 01/06/2022 1130   RBC 3.43 (L) 01/06/2022 1130   HGB 11.3 (L) 01/06/2022 1130   HCT 34.3 (L) 01/06/2022 1130   PLT 104 (L) 01/06/2022 1130   MCV 100.0 01/06/2022 1130   MCH 32.9 01/06/2022 1130   MCHC 32.9 01/06/2022 1130   RDW 16.2 (H) 01/06/2022 1130   LYMPHSABS 1.6 01/06/2022 1130   MONOABS 0.4 01/06/2022 1130   EOSABS 0.4 01/06/2022 1130   BASOSABS 0.1 01/06/2022 1130    BMET    Component Value Date/Time   NA 131 (L) 01/06/2022 1130   K 4.0 01/06/2022 1130   CL 103 01/06/2022 1130   CO2 25 01/06/2022 1130   GLUCOSE 113 (H) 01/06/2022 1130   BUN 9 01/06/2022 1130   CREATININE 0.86 01/06/2022 1130   CALCIUM 7.5 (L) 01/06/2022 1130   GFRNONAA >60 01/06/2022 1130   GFRAA >60 09/20/2019 0128    BNP No results found for: "BNP"  ProBNP    Component Value Date/Time   PROBNP 12.7 01/09/2011 1500    Specialty Problems       Pulmonary Problems   Non-seasonal allergic rhinitis due to pollen   OSA (obstructive sleep apnea)    Formatting of this note might be different from the original. Hx of mild      Sarcoidosis of lung ([place])   Lung nodule     No Known Allergies  Immunization History  Administered Date(s) Administered   Influenza, Seasonal, Injecte, Preservative Fre 01/26/2021   Influenza-Unspecified 12/09/2020   Tdap 08/05/2018   Zoster Recombinat (Shingrix) 08/23/2021    Past Medical History:  Diagnosis Date   Allergy    SEASONAL   Chronic cough    05-22-2020 per pt morning productive with yellow sputum   Depression    ED (erectile dysfunction)    Epididymal cyst    left   Fatty liver    05-22-2020  per pt has  been referred to GI,  last ultrasound in epic 06-10-2016   GERD (gastroesophageal reflux disease)    History of community acquired pneumonia 04/13/2020   ED visit in epic   History of rectal bleeding    Hypertension    05-22-2020 followed by new pcp--- no medication currently   Mild obstructive sleep apnea    05-22-2020  per pt had study approx. 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   05-19-2020  currently started seeing new pcp,  previous seen by pulmonolgy , Elijio Miles PA([facility]) lov note 05-20-2016 [facility]   Sarcoidosis of skin    05-22-2020  per pt lower legs   Wears glasses     Tobacco History: Social History   Tobacco Use  Smoking Status Former   Packs/day: 0.50   Years: 25.00   Total pack years: 12.50   Types: Cigarettes   Quit date: 04/25/2019   Years since quitting: 2.7  Smokeless Tobacco Never   Counseling given: Not Answered   Continue to not smoke  Outpatient Encounter Medications as of 01/09/2022  Medication Sig   acetaminophen (TYLENOL) 500 MG tablet Take 1,500 mg by mouth every 6 (six) hours as needed for mild pain or moderate pain.   albuterol (VENTOLIN HFA) 108 (90 Base) MCG/ACT inhaler Inhale 2 puffs into the lungs every 6 (six) hours as needed for shortness of breath.   alum & mag hydroxide-simeth (MAALOX MAX) 400-400-40 MG/5ML suspension Take 10 mLs by mouth every 6 (six) hours as needed for indigestion.   amoxicillin-clavulanate (AUGMENTIN) 875-125 MG tablet Take 1 tablet by mouth 2 (two) times daily for 10 days.   cyclobenzaprine (FLEXERIL) 5 MG tablet Take 5 mg by mouth 3 (three) times daily as needed for muscle spasms.   fluticasone (FLONASE) 50 MCG/ACT nasal spray Place 1 spray into both nostrils 2 (two) times daily.   furosemide (LASIX) 20 MG tablet Take 20 mg by mouth 2 (two) times daily.   Multiple  Vitamins-Minerals (MULTIVITAMIN ADULTS 50+ PO) Take 1 tablet by mouth daily.   omeprazole (PRILOSEC) 20 MG capsule Take 20 mg by mouth daily as needed (Heartburn).   ondansetron (ZOFRAN-ODT) 4 MG disintegrating tablet Take 1 tablet (4 mg total) by mouth every 8 (eight) hours as needed for nausea or vomiting.   pantoprazole (PROTONIX) 40 MG tablet Take 1 tablet (40 mg total) by mouth daily.   potassium chloride SA (KLOR-CON M) 20 MEQ tablet Take 1 tablet (20 mEq total) by mouth daily.   silver sulfADIAZINE (SILVADENE) 1 % cream Apply 1 Application topically daily.   spironolactone (ALDACTONE) 50 MG tablet Take 50 mg by mouth daily.   triamcinolone cream (KENALOG) 0.1 % Apply 1 Application topically daily as needed (Hives).   Vitamin D, Ergocalciferol, (DRISDOL) 1.25 MG (50000 UNIT) CAPS capsule Take 50,000 Units by mouth once a week.   No facility-administered encounter medications on file as of 01/09/2022.     Review of Systems  Review of Systems  N/a Physical Exam  BP 122/68 (BP Location: Left Arm, Patient Position: Sitting, Cuff Size: Normal)   Pulse 93   Temp 98.2 F (36.8 C) (Oral)   Ht '5\' 9"'$  (1.753 m)   Wt 226 lb 6.4 oz (102.7 kg)   SpO2 95%   BMI 33.43 kg/m   Wt Readings from Last 5 Encounters:  01/09/22 226 lb 6.4 oz (102.7 kg)  01/06/22 210 lb (95.3 kg)  10/03/21 210 lb (95.3 kg)  09/21/21 218 lb 9.6 oz (99.2 kg)  08/18/21 223 lb (101.2 kg)    BMI Readings from Last 5 Encounters:  01/09/22 33.43 kg/m  01/06/22 31.01 kg/m  10/03/21 31.01 kg/m  09/21/21 32.28 kg/m  08/18/21 32.93 kg/m     Physical Exam General: Well-appearing, no acute distress Eyes: EOMI, icterus Neck: Supple, no JVP Pulmonary: Clear, no work of breathing Cardiovascular: Regular rate and rhythm, no murmurs Abdomen: Nondistended, bowel sounds present MSK: No synovitis, no joint effusion Neuro: Normal  gait, no weakness Psych: Normal mood, full affect   Assessment & Plan:    Sarcoidosis: Diagnosed by endobronchial lymph node biopsy 2007 per his report.  Was on steroids for 10+ years.  Initially did have pulmonary or respiratory symptoms of dyspnea on exertion.  These are now resolved.  Off steroids for years.  Discussed at length that given his lack of symptoms and stable chest imaging, additional steroid therapy for breathing is not needed.  Discussed the risk and benefits of steroid therapy with significant risks and ideally minimizing this in the future if possible.  No active pulmonary sarcoidosis at this time.  Recommend at least yearly eye exams.  Cough: Chronic.  Unlikely to be related to sarcoidosis.  High suspicion more related to sinus or nasal congestion.  Describes significant nasal congestion and sputum production.  Cough improved with Flonase and sinus rinses.  He stopped this.  Now similar symptoms have recurred.  Instructed to do sinus rinses twice a day for 1 week then once daily after that.  Flonase 1 spray each nostril twice daily.  New prescription sent.  Sinus headache: In addition to sinus treatment above, Augmentin twice daily for 10 days sent for concern for sinusitis.  Cirrhosis: Related to alcohol abuse.  No signs of granulomatous inflammation or sarcoid on biopsy..   Return in about 3 months (around 04/11/2022).   Lanier Clam, MD 01/09/2022  I spent 41 minutes in care of patient including face to face visit, review of records, coordination of care.

## 2022-01-09 NOTE — Patient Instructions (Signed)
Nice to see you again  To help with the nasal congestion please resume Flonase 1 spray each nostril twice a day.  I sent a prescription in for this.  In addition, resume the nasal rinses.  Go to the cold/flu/cough while at the drugstore and get the saline packets.  Mix with distilled or purified water.  Mix the saline or salt packets with the water and spray into each nostril twice a day for the next week.  Then you can do once daily.  For the headaches, I am more about a sinus infection.  Take Augmentin 1 tablet twice a day for the next 10 days.  Return to clinic in 3 months or sooner as needed with Dr. Silas Flood

## 2022-01-23 ENCOUNTER — Encounter (HOSPITAL_BASED_OUTPATIENT_CLINIC_OR_DEPARTMENT_OTHER): Payer: Self-pay | Admitting: Emergency Medicine

## 2022-01-23 ENCOUNTER — Other Ambulatory Visit: Payer: Self-pay

## 2022-01-23 ENCOUNTER — Emergency Department (HOSPITAL_BASED_OUTPATIENT_CLINIC_OR_DEPARTMENT_OTHER): Payer: BC Managed Care – PPO

## 2022-01-23 ENCOUNTER — Emergency Department (HOSPITAL_BASED_OUTPATIENT_CLINIC_OR_DEPARTMENT_OTHER)
Admission: EM | Admit: 2022-01-23 | Discharge: 2022-01-23 | Disposition: A | Discharge status: Home / Self Care | Payer: BC Managed Care – PPO | Attending: Emergency Medicine | Admitting: Emergency Medicine

## 2022-01-23 DIAGNOSIS — Z20822 Contact with and (suspected) exposure to covid-19: Secondary | ICD-10-CM | POA: Diagnosis not present

## 2022-01-23 DIAGNOSIS — R110 Nausea: Secondary | ICD-10-CM | POA: Diagnosis not present

## 2022-01-23 DIAGNOSIS — R0602 Shortness of breath: Secondary | ICD-10-CM | POA: Diagnosis not present

## 2022-01-23 DIAGNOSIS — K769 Liver disease, unspecified: Secondary | ICD-10-CM | POA: Diagnosis not present

## 2022-01-23 DIAGNOSIS — R531 Weakness: Secondary | ICD-10-CM | POA: Diagnosis not present

## 2022-01-23 DIAGNOSIS — R5381 Other malaise: Secondary | ICD-10-CM

## 2022-01-23 DIAGNOSIS — R519 Headache, unspecified: Secondary | ICD-10-CM | POA: Diagnosis not present

## 2022-01-23 DIAGNOSIS — R059 Cough, unspecified: Secondary | ICD-10-CM | POA: Diagnosis not present

## 2022-01-23 LAB — URINALYSIS, ROUTINE W REFLEX MICROSCOPIC
Glucose, UA: NEGATIVE mg/dL
Ketones, ur: NEGATIVE mg/dL
Leukocytes,Ua: NEGATIVE
Nitrite: NEGATIVE
Protein, ur: NEGATIVE mg/dL
Specific Gravity, Urine: 1.02 (ref 1.005–1.030)
pH: 7 (ref 5.0–8.0)

## 2022-01-23 LAB — COMPREHENSIVE METABOLIC PANEL
ALT: 45 U/L — ABNORMAL HIGH (ref 0–44)
AST: 141 U/L — ABNORMAL HIGH (ref 15–41)
Albumin: 1.6 g/dL — ABNORMAL LOW (ref 3.5–5.0)
Alkaline Phosphatase: 181 U/L — ABNORMAL HIGH (ref 38–126)
Anion gap: 5 (ref 5–15)
BUN: 10 mg/dL (ref 6–20)
CO2: 25 mmol/L (ref 22–32)
Calcium: 7.5 mg/dL — ABNORMAL LOW (ref 8.9–10.3)
Chloride: 103 mmol/L (ref 98–111)
Creatinine, Ser: 0.79 mg/dL (ref 0.61–1.24)
GFR, Estimated: >60 mL/min (ref >60)
Glucose, Bld: 98 mg/dL (ref 70–99)
Potassium: 3.4 mmol/L — ABNORMAL LOW (ref 3.5–5.1)
Sodium: 133 mmol/L — ABNORMAL LOW (ref 135–145)
Total Bilirubin: 5.3 mg/dL — ABNORMAL HIGH (ref 0.3–1.2)
Total Protein: 5.9 g/dL — ABNORMAL LOW (ref 6.5–8.1)

## 2022-01-23 LAB — URINALYSIS, MICROSCOPIC (REFLEX)

## 2022-01-23 LAB — CBC
HCT: 35.9 % — ABNORMAL LOW (ref 39.0–52.0)
Hemoglobin: 11.9 g/dL — ABNORMAL LOW (ref 13.0–17.0)
MCH: 33 pg (ref 26.0–34.0)
MCHC: 33.1 g/dL (ref 30.0–36.0)
MCV: 99.4 fL (ref 80.0–100.0)
Platelets: 91 10*3/uL — ABNORMAL LOW (ref 150–400)
RBC: 3.61 MIL/uL — ABNORMAL LOW (ref 4.22–5.81)
RDW: 15.3 % (ref 11.5–15.5)
WBC: 5.7 10*3/uL (ref 4.0–10.5)
nRBC: 0 % (ref 0.0–0.2)

## 2022-01-23 LAB — RESP PANEL BY RT-PCR (FLU A&B, COVID) ARPGX2
Influenza A by PCR: NEGATIVE
Influenza B by PCR: NEGATIVE
SARS Coronavirus 2 by RT PCR: NEGATIVE

## 2022-01-23 LAB — LIPASE, BLOOD: Lipase: 47 U/L (ref 11–51)

## 2022-01-23 MED ORDER — HYDROCODONE-ACETAMINOPHEN 5-325 MG PO TABS
1.0000 | ORAL_TABLET | Freq: Once | ORAL | Status: AC
  Administered 2022-01-23: 1 via ORAL
  Filled 2022-01-23: qty 1

## 2022-01-23 NOTE — ED Triage Notes (Signed)
Feeling weak , with headache,  nausea and sob  x 2 days

## 2022-01-23 NOTE — Discharge Instructions (Addendum)
Fortunately the test today did not show any signs of acute infection or serious condition causing your symptoms today.  Continue your medications and diet regimen as recommended by your doctor.  Follow-up with your primary care doctor and liver doctor as we discussed

## 2022-01-23 NOTE — ED Provider Notes (Signed)
[facility] EMERGENCY DEPARTMENT Provider Note   CSN: [id number] Arrival date & time: 01/23/22  0630     History  Chief Complaint  Patient presents with   Headache    Trevor Reilly is a 51 y.o. male.   Headache    Patient has history of obstructive sleep apnea, sarcoidosis, cirrhosis who presents to the ED with complaints of generalized malaise weakness chills.  Symptoms started couple of days ago.  He has been feeling weak all over.  He states he is feeling very chilled and is constantly asking his family to turn up the temperature in the house.  He has been coughing a lot.  He has felt somewhat nauseous and short of breath.  He is not having any chest pain.  He does have abdominal pain but this is more chronic in nature and not acutely different.  He has not had any vomiting or diarrhea.  No known fevers.  He has not seen any blood in his stool.  Home Medications Prior to Admission medications   Medication Sig Start Date End Date Taking? Authorizing Provider  albuterol (VENTOLIN HFA) 108 (90 Base) MCG/ACT inhaler Inhale 2 puffs into the lungs every 6 (six) hours as needed for shortness of breath. 07/18/15   [provider]  alum & mag hydroxide-simeth (MAALOX MAX) 400-400-40 MG/5ML suspension Take 10 mLs by mouth every 6 (six) hours as needed for indigestion. 06/15/21   Long, [name], MD  cyclobenzaprine (FLEXERIL) 5 MG tablet Take 5 mg by mouth 3 (three) times daily as needed for muscle spasms.    [provider]  fluticasone (FLONASE) 50 MCG/ACT nasal spray Place 1 spray into both nostrils 2 (two) times daily. 01/09/22   Hunsucker, Bonna Gains, MD  furosemide (LASIX) 20 MG tablet Take 20 mg by mouth 2 (two) times daily. 09/14/21   [provider]  Multiple Vitamins-Minerals (MULTIVITAMIN ADULTS 50+ PO) Take 1 tablet by mouth daily.    [provider]  omeprazole (PRILOSEC) 20 MG capsule Take 20 mg by mouth daily as needed (Heartburn).     [provider]  ondansetron (ZOFRAN-ODT) 4 MG disintegrating tablet Take 1 tablet (4 mg total) by mouth every 8 (eight) hours as needed for nausea or vomiting. 09/24/21   Isla Pence, MD  pantoprazole (PROTONIX) 40 MG tablet Take 1 tablet (40 mg total) by mouth daily. 07/16/21   Jackquline Denmark, MD  potassium chloride SA (KLOR-CON M) 20 MEQ tablet Take 1 tablet (20 mEq total) by mouth daily. 07/16/21   Jackquline Denmark, MD  silver sulfADIAZINE (SILVADENE) 1 % cream Apply 1 Application topically daily.    [provider]  spironolactone (ALDACTONE) 50 MG tablet Take 50 mg by mouth daily. 09/14/21   [provider]  triamcinolone cream (KENALOG) 0.1 % Apply 1 Application topically daily as needed (Hives). 08/23/21   [provider]  Vitamin D, Ergocalciferol, (DRISDOL) 1.25 MG (50000 UNIT) CAPS capsule Take 50,000 Units by mouth once a week.    [provider]      Allergies    Patient has no known allergies.    Review of Systems   Review of Systems  Neurological:  Positive for headaches.    Physical Exam Updated Vital Signs BP 119/76 (BP Location: Left Arm)   Pulse 94   Resp 16   Ht 1.753 m ('5\' 9"'$ )   Wt 102.7 kg   SpO2 97%   BMI 33.44 kg/m  Physical Exam Vitals and  nursing note reviewed.  Constitutional:      Appearance: He is well-developed. He is not diaphoretic.  HENT:     Head: Normocephalic and atraumatic.     Right Ear: External ear normal.     Left Ear: External ear normal.  Eyes:     General: No scleral icterus.       Right eye: No discharge.        Left eye: No discharge.     Conjunctiva/sclera: Conjunctivae normal.  Neck:     Trachea: No tracheal deviation.  Cardiovascular:     Rate and Rhythm: Normal rate and regular rhythm.  Pulmonary:     Effort: Pulmonary effort is normal. No respiratory distress.     Breath sounds: Normal breath sounds. No stridor. No wheezing or rales.  Abdominal:     General: Bowel sounds are  normal. There is no distension.     Palpations: Abdomen is soft.     Tenderness: There is no abdominal tenderness. There is no guarding or rebound.  Musculoskeletal:        General: No tenderness or deformity.     Cervical back: Neck supple.     Comments: Mild edema bilateral lower extremities  Skin:    General: Skin is warm and dry.     Findings: No rash.  Neurological:     General: No focal deficit present.     Mental Status: He is alert.     Cranial Nerves: No cranial nerve deficit (no facial droop, extraocular movements intact, no slurred speech).     Sensory: No sensory deficit.     Motor: No abnormal muscle tone or seizure activity.     Coordination: Coordination normal.  Psychiatric:        Mood and Affect: Mood normal.     ED Results / Procedures / Treatments   Labs (all labs ordered are listed, but only abnormal results are displayed) Labs Reviewed  CBC - Abnormal; Notable for the following components:      Result Value   RBC 3.61 (*)    Hemoglobin 11.9 (*)    HCT 35.9 (*)    Platelets 91 (*)    All other components within normal limits  COMPREHENSIVE METABOLIC PANEL - Abnormal; Notable for the following components:   Sodium 133 (*)    Potassium 3.4 (*)    Calcium 7.5 (*)    Total Protein 5.9 (*)    Albumin 1.6 (*)    AST 141 (*)    ALT 45 (*)    Alkaline Phosphatase 181 (*)    Total Bilirubin 5.3 (*)    All other components within normal limits  URINALYSIS, ROUTINE W REFLEX MICROSCOPIC - Abnormal; Notable for the following components:   Hgb urine dipstick SMALL (*)    Bilirubin Urine SMALL (*)    All other components within normal limits  URINALYSIS, MICROSCOPIC (REFLEX) - Abnormal; Notable for the following components:   Bacteria, UA RARE (*)    All other components within normal limits  RESP PANEL BY RT-PCR (FLU A&B, COVID) ARPGX2  LIPASE, BLOOD    EKG None  Radiology DG Chest 2 View  Result Date: 01/23/2022 CLINICAL DATA:  Table formatting  from the original note was not included. Images from the original note were not included. Signed Feeling weak , with headache, nausea and sob x 2 days cough EXAM: CHEST - 2 VIEW COMPARISON:  08/18/2021 FINDINGS: Normal cardiac silhouette. Prominence of the LEFT and RIGHT hilum is  not changed from comparison exam. No pulmonary edema. No infiltrate or pneumothorax. No acute osseous abnormality. IMPRESSION: No acute cardiopulmonary process. Electronically Signed   By: Suzy Bouchard M.D.   On: 01/23/2022 07:58    Procedures Procedures    Medications Ordered in ED Medications  HYDROcodone-acetaminophen (NORCO/VICODIN) 5-325 MG per tablet 1 tablet (1 tablet Oral Given 01/23/22 1036)    ED Course/ Medical Decision Making/ A&P Clinical Course as of 01/23/22 1131  Wed Jan 23, 2022  0911 Comprehensive metabolic panel(!) LFTs abnormal, not significantly changed compared to previous, consistent with his chronic liver disease [JK]  0911 CBC(!) Anemia noted but not different compared to previous [JK]  0911 Lipase, blood Normal [JK]  0911 DG Chest 2 View Chest x-ray without acute findings [JK]  1101 Urinalysis, Routine w reflex microscopic Urine, Clean Catch(!) No signs of infection [JK]    Clinical Course User Index [JK] Dorie Rank, MD                           Medical Decision Making Problems Addressed: Chronic liver disease: chronic illness or injury Malaise: acute illness or injury Nonintractable headache, unspecified chronicity pattern, unspecified headache type: acute illness or injury  Amount and/or Complexity of Data Reviewed Labs: ordered. Decision-making details documented in ED Course. Radiology: ordered and independent interpretation performed. Decision-making details documented in ED Course.  Risk Prescription drug management.   Patient presented ED with complaints of chills malaise, headaches.  Patient with multiple comorbidities including chronic liver disease.   Fortunately no signs of any serious infection.  No signs of severe anemia.  No severe dehydration.  LFTs unchanged from baseline.  No findings to suggest acute infection.  COVID and flu are negative.  Chest x-ray without pneumonia.  Headache is not severe.  No meningismus on exam.  No recent trauma.  Symptoms possibly related to viral infection as well as generalized malaise associated with chronic liver disease.  Evaluation and diagnostic testing in the emergency department does not suggest an emergent condition requiring admission or immediate intervention beyond what has been performed at this time.  The patient is safe for discharge and has been instructed to return immediately for worsening symptoms, change in symptoms or any other concerns.        Final Clinical Impression(s) / ED Diagnoses Final diagnoses:  Malaise  Chronic liver disease  Nonintractable headache, unspecified chronicity pattern, unspecified headache type    Rx / DC Orders ED Discharge Orders     None         Dorie Rank, MD 01/23/22 1131

## 2022-02-08 ENCOUNTER — Other Ambulatory Visit: Payer: Self-pay

## 2022-02-08 ENCOUNTER — Emergency Department (HOSPITAL_BASED_OUTPATIENT_CLINIC_OR_DEPARTMENT_OTHER)
Admission: EM | Admit: 2022-02-08 | Discharge: 2022-02-08 | Disposition: A | Discharge status: Home / Self Care | Payer: BC Managed Care – PPO | Attending: Emergency Medicine | Admitting: Emergency Medicine

## 2022-02-08 ENCOUNTER — Encounter (HOSPITAL_BASED_OUTPATIENT_CLINIC_OR_DEPARTMENT_OTHER): Payer: Self-pay | Admitting: Emergency Medicine

## 2022-02-08 DIAGNOSIS — E876 Hypokalemia: Secondary | ICD-10-CM

## 2022-02-08 DIAGNOSIS — Z1152 Encounter for screening for COVID-19: Secondary | ICD-10-CM | POA: Insufficient documentation

## 2022-02-08 DIAGNOSIS — R1084 Generalized abdominal pain: Secondary | ICD-10-CM | POA: Insufficient documentation

## 2022-02-08 DIAGNOSIS — R519 Headache, unspecified: Secondary | ICD-10-CM | POA: Diagnosis not present

## 2022-02-08 DIAGNOSIS — R0981 Nasal congestion: Secondary | ICD-10-CM | POA: Diagnosis not present

## 2022-02-08 LAB — COMPREHENSIVE METABOLIC PANEL
ALT: 53 U/L — ABNORMAL HIGH (ref 0–44)
AST: 195 U/L — ABNORMAL HIGH (ref 15–41)
Albumin: 1.7 g/dL — ABNORMAL LOW (ref 3.5–5.0)
Alkaline Phosphatase: 196 U/L — ABNORMAL HIGH (ref 38–126)
Anion gap: 5 (ref 5–15)
BUN: 19 mg/dL (ref 6–20)
CO2: 22 mmol/L (ref 22–32)
Calcium: 7.7 mg/dL — ABNORMAL LOW (ref 8.9–10.3)
Chloride: 105 mmol/L (ref 98–111)
Creatinine, Ser: 1.32 mg/dL — ABNORMAL HIGH (ref 0.61–1.24)
GFR, Estimated: >60 mL/min (ref >60)
Glucose, Bld: 112 mg/dL — ABNORMAL HIGH (ref 70–99)
Potassium: 3.5 mmol/L (ref 3.5–5.1)
Sodium: 132 mmol/L — ABNORMAL LOW (ref 135–145)
Total Bilirubin: 5.4 mg/dL — ABNORMAL HIGH (ref 0.3–1.2)
Total Protein: 5.6 g/dL — ABNORMAL LOW (ref 6.5–8.1)

## 2022-02-08 LAB — CBC WITH DIFFERENTIAL/PLATELET
Abs Immature Granulocytes: 0.03 10*3/uL (ref 0.00–0.07)
Basophils Absolute: 0.2 10*3/uL — ABNORMAL HIGH (ref 0.0–0.1)
Basophils Relative: 2 %
Eosinophils Absolute: 0.5 10*3/uL (ref 0.0–0.5)
Eosinophils Relative: 6 %
HCT: 31.2 % — ABNORMAL LOW (ref 39.0–52.0)
Hemoglobin: 10.4 g/dL — ABNORMAL LOW (ref 13.0–17.0)
Immature Granulocytes: 0 %
Lymphocytes Relative: 26 %
Lymphs Abs: 1.9 10*3/uL (ref 0.7–4.0)
MCH: 33.4 pg (ref 26.0–34.0)
MCHC: 33.3 g/dL (ref 30.0–36.0)
MCV: 100.3 fL — ABNORMAL HIGH (ref 80.0–100.0)
Monocytes Absolute: 0.6 10*3/uL (ref 0.1–1.0)
Monocytes Relative: 8 %
Neutro Abs: 4.2 10*3/uL (ref 1.7–7.7)
Neutrophils Relative %: 58 %
Platelets: 110 10*3/uL — ABNORMAL LOW (ref 150–400)
RBC: 3.11 MIL/uL — ABNORMAL LOW (ref 4.22–5.81)
RDW: 16.1 % — ABNORMAL HIGH (ref 11.5–15.5)
WBC: 7.3 10*3/uL (ref 4.0–10.5)
nRBC: 0 % (ref 0.0–0.2)

## 2022-02-08 LAB — RESP PANEL BY RT-PCR (RSV, FLU A&B, COVID)  RVPGX2
Influenza A by PCR: NEGATIVE
Influenza B by PCR: NEGATIVE
Resp Syncytial Virus by PCR: NEGATIVE
SARS Coronavirus 2 by RT PCR: NEGATIVE

## 2022-02-08 LAB — LIPASE, BLOOD: Lipase: 49 U/L (ref 11–51)

## 2022-02-08 MED ORDER — DIPHENHYDRAMINE HCL 50 MG/ML IJ SOLN
12.5000 mg | Freq: Once | INTRAMUSCULAR | Status: AC
  Administered 2022-02-08: 12.5 mg via INTRAVENOUS
  Filled 2022-02-08: qty 1

## 2022-02-08 MED ORDER — FUROSEMIDE 20 MG PO TABS: 20.0000 mg | ORAL_TABLET | Freq: Two times a day (BID) | ORAL | 0 refills | Status: AC

## 2022-02-08 MED ORDER — OXYCODONE HCL 5 MG PO TABS: 5.0000 mg | ORAL_TABLET | Freq: Once | ORAL | Status: DC

## 2022-02-08 MED ORDER — OXYCODONE HCL 5 MG PO TABS: 5.0000 mg | ORAL_TABLET | Freq: Three times a day (TID) | ORAL | 0 refills | Status: AC | PRN

## 2022-02-08 MED ORDER — PROCHLORPERAZINE EDISYLATE 10 MG/2ML IJ SOLN
10.0000 mg | Freq: Once | INTRAMUSCULAR | Status: AC
  Administered 2022-02-08: 10 mg via INTRAVENOUS
  Filled 2022-02-08: qty 2

## 2022-02-08 MED ORDER — SPIRONOLACTONE 50 MG PO TABS: 50.0000 mg | ORAL_TABLET | Freq: Every day | ORAL | 0 refills | Status: AC

## 2022-02-08 NOTE — Discharge Instructions (Signed)
I recommend that further chronic pain management to be provided by your primary care doctors and your GI doctor.  Please try to follow-up with the GI doctor early next week.

## 2022-02-08 NOTE — ED Triage Notes (Signed)
Pt to ER with c/o abdominal pain and headache and SHOB.  Pt hx of liver disease.  Provider at bedside.

## 2022-02-08 NOTE — ED Provider Notes (Signed)
[facility] Provider Note   CSN: [id number] Arrival date & time: 02/08/22  0908     History  Chief Complaint  Patient presents with   Abdominal Pain    Trevor Reilly is a 51 y.o. male.  Patient here with headache, abdominal pain.  Mostly has headache.  He has history of liver disease and cirrhosis and states that he has some chronic abdominal pain.  None worse than normal.  He has not been taking his diuretics.  He supposed to follow-up with his liver team next month.  He denies any weakness or numbness or vision changes.  Headache is mostly frontal.  Has had congestion.  Denies any fevers or chills.  Denies any nausea or vomiting.  Denies any issues with bowel movements.  He intermittently will get paracentesis but he has not had 1 for several months.  Does not need them often.  He states that he is not drinking alcohol anymore.  The history is provided by the patient.       Home Medications Prior to Admission medications   Medication Sig Start Date End Date Taking? Authorizing Provider  oxyCODONE (ROXICODONE) 5 MG immediate release tablet Take 1 tablet (5 mg total) by mouth every 8 (eight) hours as needed for up to 5 days for severe pain or breakthrough pain. 02/08/22 02/13/22 Yes Joss Mcdill, DO  albuterol (VENTOLIN HFA) 108 (90 Base) MCG/ACT inhaler Inhale 2 puffs into the lungs every 6 (six) hours as needed for shortness of breath. 07/18/15   [provider]  alum & mag hydroxide-simeth (MAALOX MAX) 400-400-40 MG/5ML suspension Take 10 mLs by mouth every 6 (six) hours as needed for indigestion. 06/15/21   Long, [name], MD  cyclobenzaprine (FLEXERIL) 5 MG tablet Take 5 mg by mouth 3 (three) times daily as needed for muscle spasms.    [provider]  fluticasone (FLONASE) 50 MCG/ACT nasal spray Place 1 spray into both nostrils 2 (two) times daily. 01/09/22   Hunsucker, Bonna Gains, MD  furosemide (LASIX) 20 MG tablet Take 1  tablet (20 mg total) by mouth 2 (two) times daily. 02/08/22   Kaliope Quinonez, DO  Multiple Vitamins-Minerals (MULTIVITAMIN ADULTS 50+ PO) Take 1 tablet by mouth daily.    [provider]  omeprazole (PRILOSEC) 20 MG capsule Take 20 mg by mouth daily as needed (Heartburn).    [provider]  ondansetron (ZOFRAN-ODT) 4 MG disintegrating tablet Take 1 tablet (4 mg total) by mouth every 8 (eight) hours as needed for nausea or vomiting. 09/24/21   Isla Pence, MD  pantoprazole (PROTONIX) 40 MG tablet Take 1 tablet (40 mg total) by mouth daily. 07/16/21   Jackquline Denmark, MD  potassium chloride SA (KLOR-CON M) 20 MEQ tablet Take 1 tablet (20 mEq total) by mouth daily. 07/16/21   Jackquline Denmark, MD  silver sulfADIAZINE (SILVADENE) 1 % cream Apply 1 Application topically daily.    [provider]  spironolactone (ALDACTONE) 50 MG tablet Take 1 tablet (50 mg total) by mouth daily. 02/08/22   Ilamae Geng, DO  triamcinolone cream (KENALOG) 0.1 % Apply 1 Application topically daily as needed (Hives). 08/23/21   [provider]  Vitamin D, Ergocalciferol, (DRISDOL) 1.25 MG (50000 UNIT) CAPS capsule Take 50,000 Units by mouth once a week.    [provider]      Allergies    Patient has no known allergies.    Review of Systems   Review of Systems  Physical  Exam Updated Vital Signs BP 127/86   Pulse 92   Temp 98.1 F (36.7 C) (Oral)   Resp 18   SpO2 99%  Physical Exam Vitals and nursing note reviewed.  Constitutional:      General: He is not in acute distress.    Appearance: He is well-developed. He is not ill-appearing.  HENT:     Head: Normocephalic and atraumatic.     Mouth/Throat:     Mouth: Mucous membranes are moist.  Eyes:     Extraocular Movements: Extraocular movements intact.     Conjunctiva/sclera: Conjunctivae normal.  Cardiovascular:     Rate and Rhythm: Normal rate and regular rhythm.     Heart sounds: Normal heart sounds. No murmur  heard. Pulmonary:     Effort: Pulmonary effort is normal. No respiratory distress.     Breath sounds: Normal breath sounds.  Abdominal:     General: There is distension.     Palpations: Abdomen is soft.     Tenderness: There is no abdominal tenderness. There is no guarding or rebound.     Hernia: No hernia is present.  Musculoskeletal:        General: No swelling.     Cervical back: Neck supple.  Skin:    General: Skin is warm and dry.     Capillary Refill: Capillary refill takes less than 2 seconds.  Neurological:     General: No focal deficit present.     Mental Status: He is alert and oriented to person, place, and time.     Cranial Nerves: No cranial nerve deficit.     Motor: No weakness.     Comments: 5+ out of 5 strength throughout, normal sensation, no drift, normal finger-nose-finger, normal speech  Psychiatric:        Mood and Affect: Mood normal.     ED Results / Procedures / Treatments   Labs (all labs ordered are listed, but only abnormal results are displayed) Labs Reviewed  CBC WITH DIFFERENTIAL/PLATELET - Abnormal; Notable for the following components:      Result Value   RBC 3.11 (*)    Hemoglobin 10.4 (*)    HCT 31.2 (*)    MCV 100.3 (*)    RDW 16.1 (*)    Platelets 110 (*)    Basophils Absolute 0.2 (*)    All other components within normal limits  COMPREHENSIVE METABOLIC PANEL - Abnormal; Notable for the following components:   Sodium 132 (*)    Glucose, Bld 112 (*)    Creatinine, Ser 1.32 (*)    Calcium 7.7 (*)    Total Protein 5.6 (*)    Albumin 1.7 (*)    AST 195 (*)    ALT 53 (*)    Alkaline Phosphatase 196 (*)    Total Bilirubin 5.4 (*)    All other components within normal limits  RESP PANEL BY RT-PCR (RSV, FLU A&B, COVID)  RVPGX2  LIPASE, BLOOD    EKG None  Radiology No results found.  Procedures Procedures    Medications Ordered in ED Medications  oxyCODONE (Oxy IR/ROXICODONE) immediate release tablet 5 mg (has no  administration in time range)  prochlorperazine (COMPAZINE) injection 10 mg (10 mg Intravenous Given 02/08/22 0940)  diphenhydrAMINE (BENADRYL) injection 12.5 mg (12.5 mg Intravenous Given 02/08/22 0940)    ED Course/ Medical Decision Making/ A&P  Medical Decision Making Amount and/or Complexity of Data Reviewed Labs: ordered.  Risk Prescription drug management.   Trevor Reilly is here with headache, abdominal discomfort.  Normal vitals.  No fever.  He is here for mostly congestion and headache.  Having difficulty with sleeping.  Continues to have ongoing abdominal pain at times but states that he is not having any more pain than he normally does.  He has not been taking his diuretics.  He has a history of alcoholic cirrhosis, hypertension.  He denies any fevers or chills but he has had some congestions.  Denies any nausea or vomiting or issues with bowels.  Still little bit of abdominal distention on exam but he has no tenderness.  Neurologically he is intact.  Differential diagnosis likely viral process versus migraine versus chronic pain process.  I did a bedside ultrasound and there is some ascites but I do not see any large pocket.  Will restart him on his diuretics to help with some fluid retention.  He has follow-up with his liver doctor early next month.  At this time do not think paracentesis is needed.  Headache seems may be viral or migraine in nature.  Neurologically he is intact.  Will give headache medicine with Compazine and Benadryl.  Will check basic labs.  I have no concern for acute intra-abdominal process including bowel obstruction or acut liver or gallbladder process.  He is well-appearing.   Per my review and interpretation of labs, no significant findings.  Liver enzymes at baseline.  Creatinine mildly elevated but otherwise lab works unremarkable.  Suspect some acute and chronic pain.  Will prescribe oxycodone for breakthrough pain.  His headaches  resolved.  COVID and flu test still pending.  Will follow-up on his MyChart.  Encourage close follow-up with his GI doctor to possibly help arrange for paracentesis.  At this time I do not think there is a large enough volume for me to drain and at this time he is not having any major respiratory symptoms.  He understands return precautions.  Discharged in good condition.  I strongly urged him to have further chronic pain management done by his primary care doctors.  This chart was dictated using voice recognition software.  Despite best efforts to proofread,  errors can occur which can change the documentation meaning.  Document critical care time when appropriate:1}      Final Clinical Impression(s) / ED Diagnoses Final diagnoses:  Nonintractable headache, unspecified chronicity pattern, unspecified headache type  Generalized abdominal pain    Rx / DC Orders ED Discharge Orders          Ordered    furosemide (LASIX) 20 MG tablet  2 times daily        02/08/22 0923    spironolactone (ALDACTONE) 50 MG tablet  Daily        02/08/22 0923    oxyCODONE (ROXICODONE) 5 MG immediate release tablet  Every 8 hours PRN        02/08/22 [place], Essica Kiker, DO 02/08/22 1028

## 2022-02-22 DIAGNOSIS — R791 Abnormal coagulation profile: Secondary | ICD-10-CM | POA: Diagnosis not present

## 2022-02-22 DIAGNOSIS — R7989 Other specified abnormal findings of blood chemistry: Secondary | ICD-10-CM | POA: Diagnosis not present

## 2022-02-22 DIAGNOSIS — R790 Abnormal level of blood mineral: Secondary | ICD-10-CM | POA: Diagnosis not present

## 2022-02-22 DIAGNOSIS — Z1339 Encounter for screening examination for other mental health and behavioral disorders: Secondary | ICD-10-CM | POA: Diagnosis not present

## 2022-02-22 DIAGNOSIS — K7030 Alcoholic cirrhosis of liver without ascites: Secondary | ICD-10-CM | POA: Diagnosis not present

## 2022-03-19 ENCOUNTER — Emergency Department (HOSPITAL_COMMUNITY): Payer: BC Managed Care – PPO

## 2022-03-19 ENCOUNTER — Emergency Department (HOSPITAL_COMMUNITY)
Admission: EM | Admit: 2022-03-19 | Discharge: 2022-03-20 | Disposition: A | Discharge status: Home / Self Care | Payer: BC Managed Care – PPO | Attending: Emergency Medicine | Admitting: Emergency Medicine

## 2022-03-19 ENCOUNTER — Other Ambulatory Visit: Payer: Self-pay

## 2022-03-19 DIAGNOSIS — K7460 Unspecified cirrhosis of liver: Secondary | ICD-10-CM | POA: Diagnosis not present

## 2022-03-19 DIAGNOSIS — Z1152 Encounter for screening for COVID-19: Secondary | ICD-10-CM | POA: Insufficient documentation

## 2022-03-19 DIAGNOSIS — Z79899 Other long term (current) drug therapy: Secondary | ICD-10-CM | POA: Insufficient documentation

## 2022-03-19 DIAGNOSIS — R310 Gross hematuria: Secondary | ICD-10-CM | POA: Diagnosis not present

## 2022-03-19 DIAGNOSIS — R0602 Shortness of breath: Secondary | ICD-10-CM | POA: Diagnosis not present

## 2022-03-19 DIAGNOSIS — E871 Hypo-osmolality and hyponatremia: Secondary | ICD-10-CM | POA: Diagnosis not present

## 2022-03-19 DIAGNOSIS — R319 Hematuria, unspecified: Secondary | ICD-10-CM | POA: Diagnosis not present

## 2022-03-19 DIAGNOSIS — R7401 Elevation of levels of liver transaminase levels: Secondary | ICD-10-CM | POA: Insufficient documentation

## 2022-03-19 DIAGNOSIS — I10 Essential (primary) hypertension: Secondary | ICD-10-CM | POA: Diagnosis not present

## 2022-03-19 LAB — COMPREHENSIVE METABOLIC PANEL
ALT: 56 U/L — ABNORMAL HIGH (ref 0–44)
AST: 158 U/L — ABNORMAL HIGH (ref 15–41)
Albumin: 1.9 g/dL — ABNORMAL LOW (ref 3.5–5.0)
Alkaline Phosphatase: 144 U/L — ABNORMAL HIGH (ref 38–126)
Anion gap: 7 (ref 5–15)
BUN: 15 mg/dL (ref 6–20)
CO2: 24 mmol/L (ref 22–32)
Calcium: 8 mg/dL — ABNORMAL LOW (ref 8.9–10.3)
Chloride: 101 mmol/L (ref 98–111)
Creatinine, Ser: 1 mg/dL (ref 0.61–1.24)
GFR, Estimated: >60 mL/min (ref >60)
Glucose, Bld: 110 mg/dL — ABNORMAL HIGH (ref 70–99)
Potassium: 4.2 mmol/L (ref 3.5–5.1)
Sodium: 132 mmol/L — ABNORMAL LOW (ref 135–145)
Total Bilirubin: 6.2 mg/dL — ABNORMAL HIGH (ref 0.3–1.2)
Total Protein: 6.9 g/dL (ref 6.5–8.1)

## 2022-03-19 LAB — CBC WITH DIFFERENTIAL/PLATELET
Abs Immature Granulocytes: 0.01 10*3/uL (ref 0.00–0.07)
Basophils Absolute: 0.1 10*3/uL (ref 0.0–0.1)
Basophils Relative: 3 %
Eosinophils Absolute: 0.1 10*3/uL (ref 0.0–0.5)
Eosinophils Relative: 3 %
HCT: 33.3 % — ABNORMAL LOW (ref 39.0–52.0)
Hemoglobin: 11 g/dL — ABNORMAL LOW (ref 13.0–17.0)
Immature Granulocytes: 0 %
Lymphocytes Relative: 31 %
Lymphs Abs: 1.5 10*3/uL (ref 0.7–4.0)
MCH: 33.8 pg (ref 26.0–34.0)
MCHC: 33 g/dL (ref 30.0–36.0)
MCV: 102.5 fL — ABNORMAL HIGH (ref 80.0–100.0)
Monocytes Absolute: 0.5 10*3/uL (ref 0.1–1.0)
Monocytes Relative: 10 %
Neutro Abs: 2.6 10*3/uL (ref 1.7–7.7)
Neutrophils Relative %: 53 %
Platelets: 99 10*3/uL — ABNORMAL LOW (ref 150–400)
RBC: 3.25 MIL/uL — ABNORMAL LOW (ref 4.22–5.81)
RDW: 16.9 % — ABNORMAL HIGH (ref 11.5–15.5)
WBC: 4.9 10*3/uL (ref 4.0–10.5)
nRBC: 0 % (ref 0.0–0.2)

## 2022-03-19 LAB — URINALYSIS, ROUTINE W REFLEX MICROSCOPIC
Bacteria, UA: NONE SEEN
Glucose, UA: NEGATIVE mg/dL
Ketones, ur: NEGATIVE mg/dL
Leukocytes,Ua: NEGATIVE
Nitrite: NEGATIVE
Protein, ur: NEGATIVE mg/dL
RBC / HPF: >50 RBC/hpf — ABNORMAL HIGH (ref 0–5)
Specific Gravity, Urine: 1.036 — ABNORMAL HIGH (ref 1.005–1.030)
pH: 7 (ref 5.0–8.0)

## 2022-03-19 LAB — AMMONIA: Ammonia: 20 umol/L (ref 9–35)

## 2022-03-19 LAB — RESP PANEL BY RT-PCR (RSV, FLU A&B, COVID)  RVPGX2
Influenza A by PCR: NEGATIVE
Influenza B by PCR: NEGATIVE
Resp Syncytial Virus by PCR: NEGATIVE
SARS Coronavirus 2 by RT PCR: NEGATIVE

## 2022-03-19 LAB — BRAIN NATRIURETIC PEPTIDE: B Natriuretic Peptide: 76.1 pg/mL (ref 0.0–100.0)

## 2022-03-19 LAB — PROTIME-INR
INR: 1.9 — ABNORMAL HIGH (ref 0.8–1.2)
Prothrombin Time: 21.8 seconds — ABNORMAL HIGH (ref 11.4–15.2)

## 2022-03-19 LAB — ETHANOL: Alcohol, Ethyl (B): <10 mg/dL (ref <10)

## 2022-03-19 LAB — LIPASE, BLOOD: Lipase: 66 U/L — ABNORMAL HIGH (ref 11–51)

## 2022-03-19 MED ORDER — IOHEXOL 300 MG/ML  SOLN
100.0000 mL | Freq: Once | INTRAMUSCULAR | Status: AC | PRN
  Administered 2022-03-19: 100 mL via INTRAVENOUS

## 2022-03-19 NOTE — ED Triage Notes (Signed)
C/o hematuria, generalized abd pain, sob, and dizziness since last night.  Hx of alcoholic cirrhosis.

## 2022-03-19 NOTE — ED Provider Triage Note (Signed)
Emergency Medicine Provider Triage Evaluation Note  Trevor Reilly , a 52 y.o. male  was evaluated in triage.  Pt complains of blood in the urine, abdominal pain, SOB and dizziness since last night. Hx of alcoholic cirrhosis  Review of Systems  Positive: As above Negative: Fever, vomiting, chest pain  Physical Exam  There were no vitals taken for this visit. Gen:   Awake, no distress   Resp:  Normal effort  MSK:   Moves extremities without difficulty  Other:    Medical Decision Making  Medically screening exam initiated at 3:36 PM.  Appropriate orders placed.  TANNEN VANDEZANDE was informed that the remainder of the evaluation will be completed by another provider, this initial triage assessment does not replace that evaluation, and the importance of remaining in the ED until their evaluation is complete.     Gwenevere Abbot, [place] 03/19/22 1537

## 2022-03-20 MED ORDER — SULFAMETHOXAZOLE-TRIMETHOPRIM 800-160 MG PO TABS: 1.0000 | ORAL_TABLET | Freq: Two times a day (BID) | ORAL | 0 refills | Status: AC

## 2022-03-20 MED ORDER — SODIUM CHLORIDE 0.9 % IV SOLN
1.0000 g | Freq: Once | INTRAVENOUS | Status: AC
  Administered 2022-03-20: 1 g via INTRAVENOUS
  Filled 2022-03-20: qty 10

## 2022-03-20 MED ORDER — SODIUM CHLORIDE 0.9 % IV BOLUS
500.0000 mL | Freq: Once | INTRAVENOUS | Status: AC
  Administered 2022-03-20: 500 mL via INTRAVENOUS

## 2022-03-20 NOTE — ED Provider Notes (Signed)
[facility] Provider Note   CSN: [id number] Arrival date & time: 03/19/22  1459     History  Chief Complaint  Patient presents with   Hematuria    Trevor Reilly is a 52 y.o. male with history of alcoholic cirrhosis who presents with concern for hematuria for the last 24 hours in context of 3 days of urinary urgency, dysuria, suprapubic pain, and increased urinary frequency.  Patient denies fevers or chills, has baseline nausea with cirrhosis but no increased nausea, no vomiting or diarrhea.  I personally reviewed his medical records.  In addition to above with history of serious history of hypertension, sarcoidosis, right bundle branch block obstructive sleep apnea.  He is not anticoagulated.  HPI     Home Medications Prior to Admission medications   Medication Sig Start Date End Date Taking? Authorizing Provider  sulfamethoxazole-trimethoprim (BACTRIM DS) 800-160 MG tablet Take 1 tablet by mouth 2 (two) times daily for 7 days. 03/20/22 03/27/22 Yes Roshawn Ayala, Eugene Garnet R, PA-C  albuterol (VENTOLIN HFA) 108 (90 Base) MCG/ACT inhaler Inhale 2 puffs into the lungs every 6 (six) hours as needed for shortness of breath. 07/18/15   [provider]  alum & mag hydroxide-simeth (MAALOX MAX) 400-400-40 MG/5ML suspension Take 10 mLs by mouth every 6 (six) hours as needed for indigestion. 06/15/21   Long, [name], MD  cyclobenzaprine (FLEXERIL) 5 MG tablet Take 5 mg by mouth 3 (three) times daily as needed for muscle spasms.    [provider]  fluticasone (FLONASE) 50 MCG/ACT nasal spray Place 1 spray into both nostrils 2 (two) times daily. 01/09/22   Hunsucker, Bonna Gains, MD  furosemide (LASIX) 20 MG tablet Take 1 tablet (20 mg total) by mouth 2 (two) times daily. 02/08/22   Curatolo, Adam, DO  Multiple Vitamins-Minerals (MULTIVITAMIN ADULTS 50+ PO) Take 1 tablet by mouth daily.    [provider]  omeprazole (PRILOSEC) 20  MG capsule Take 20 mg by mouth daily as needed (Heartburn).    [provider]  ondansetron (ZOFRAN-ODT) 4 MG disintegrating tablet Take 1 tablet (4 mg total) by mouth every 8 (eight) hours as needed for nausea or vomiting. 09/24/21   Isla Pence, MD  pantoprazole (PROTONIX) 40 MG tablet Take 1 tablet (40 mg total) by mouth daily. 07/16/21   Jackquline Denmark, MD  potassium chloride SA (KLOR-CON M) 20 MEQ tablet Take 1 tablet (20 mEq total) by mouth daily. 07/16/21   Jackquline Denmark, MD  silver sulfADIAZINE (SILVADENE) 1 % cream Apply 1 Application topically daily.    [provider]  spironolactone (ALDACTONE) 50 MG tablet Take 1 tablet (50 mg total) by mouth daily. 02/08/22   Curatolo, Adam, DO  triamcinolone cream (KENALOG) 0.1 % Apply 1 Application topically daily as needed (Hives). 08/23/21   [provider]  Vitamin D, Ergocalciferol, (DRISDOL) 1.25 MG (50000 UNIT) CAPS capsule Take 50,000 Units by mouth once a week.    [provider]      Allergies    Patient has no known allergies.    Review of Systems   Review of Systems  HENT: Negative.    Gastrointestinal:  Positive for nausea. Negative for abdominal distention, diarrhea and vomiting.  Genitourinary:  Positive for dysuria, frequency, hematuria and urgency. Negative for flank pain, penile pain and penile swelling.  Musculoskeletal: Negative.   Skin: Negative.     Physical Exam Updated Vital Signs BP 124/78   Pulse 84  Temp 97.8 F (36.6 C) (Oral)   Resp 17   Wt 102 kg   SpO2 100%   BMI 33.21 kg/m  Physical Exam Vitals and nursing note reviewed.  Constitutional:      Appearance: He is not ill-appearing or toxic-appearing.  HENT:     Head: Normocephalic and atraumatic.     Mouth/Throat:     Mouth: Mucous membranes are moist.     Pharynx: No oropharyngeal exudate or posterior oropharyngeal erythema.  Eyes:     General:        Right eye: No discharge.        Left eye: No discharge.      Conjunctiva/sclera: Conjunctivae normal.  Cardiovascular:     Rate and Rhythm: Normal rate and regular rhythm.     Pulses: Normal pulses.     Heart sounds: Normal heart sounds. No murmur heard. Pulmonary:     Effort: Pulmonary effort is normal. No respiratory distress.     Breath sounds: Normal breath sounds. No wheezing or rales.  Abdominal:     General: Bowel sounds are normal. There is distension.     Palpations: Abdomen is soft. There is fluid wave.     Tenderness: There is abdominal tenderness in the suprapubic area. There is no right CVA tenderness, left CVA tenderness, guarding or rebound.  Musculoskeletal:        General: No deformity.     Cervical back: Neck supple.     Right lower leg: No edema.     Left lower leg: No edema.  Skin:    General: Skin is warm and dry.     Capillary Refill: Capillary refill takes less than 2 seconds.  Neurological:     General: No focal deficit present.     Mental Status: He is alert and oriented to person, place, and time. Mental status is at baseline.  Psychiatric:        Mood and Affect: Mood normal.     ED Results / Procedures / Treatments   Labs (all labs ordered are listed, but only abnormal results are displayed) Labs Reviewed  CBC WITH DIFFERENTIAL/PLATELET - Abnormal; Notable for the following components:      Result Value   RBC 3.25 (*)    Hemoglobin 11.0 (*)    HCT 33.3 (*)    MCV 102.5 (*)    RDW 16.9 (*)    Platelets 99 (*)    All other components within normal limits  COMPREHENSIVE METABOLIC PANEL - Abnormal; Notable for the following components:   Sodium 132 (*)    Glucose, Bld 110 (*)    Calcium 8.0 (*)    Albumin 1.9 (*)    AST 158 (*)    ALT 56 (*)    Alkaline Phosphatase 144 (*)    Total Bilirubin 6.2 (*)    All other components within normal limits  LIPASE, BLOOD - Abnormal; Notable for the following components:   Lipase 66 (*)    All other components within normal limits  URINALYSIS, ROUTINE W REFLEX  MICROSCOPIC - Abnormal; Notable for the following components:   Color, Urine AMBER (*)    Specific Gravity, Urine 1.036 (*)    Hgb urine dipstick SMALL (*)    Bilirubin Urine SMALL (*)    RBC / HPF >50 (*)    All other components within normal limits  PROTIME-INR - Abnormal; Notable for the following components:   Prothrombin Time 21.8 (*)    INR 1.9 (*)  All other components within normal limits  RESP PANEL BY RT-PCR (RSV, FLU A&B, COVID)  [id number]  URINE CULTURE  BRAIN NATRIURETIC PEPTIDE  AMMONIA  ETHANOL    EKG EKG Interpretation  Date/Time:  Tuesday March 19 2022 15:51:56 EST Ventricular Rate:  82 PR Interval:  108 QRS Duration: 122 QT Interval:  444 QTC Calculation: 519 R Axis:   92 Text Interpretation: Sinus rhythm Short PR interval RBBB and LPFB Confirmed by Thayer Jew [phone]) on 03/20/2022 1:15:43 AM  Radiology CT ABDOMEN PELVIS W CONTRAST  Result Date: 03/19/2022 CLINICAL DATA:  Acute abdominal pain. Shortness of breath and dizziness. Hematuria. History of cirrhosis. EXAM: CT ABDOMEN AND PELVIS WITH CONTRAST TECHNIQUE: Multidetector CT imaging of the abdomen and pelvis was performed using the standard protocol following bolus administration of intravenous contrast. RADIATION DOSE REDUCTION: This exam was performed according to the departmental dose-optimization program which includes automated exposure control, adjustment of the mA and/or kV according to patient size and/or use of iterative reconstruction technique. CONTRAST:  115m OMNIPAQUE IOHEXOL 300 MG/ML  SOLN COMPARISON:  09/20/2019 FINDINGS: Lower chest: Moderate sized type 3 hiatal hernia. Mild peribronchovascular reticular interstitial accentuation without overt nodularity, similar to the 09/20/2019 exam, possibly related to chronic airway thickening. Hepatobiliary: Small and irregular cirrhotic liver. No obvious intrahepatic mass. The volume of liver has potentially reduced compared to the prior exam.  Mildly narrowed but otherwise patent portal vein. Mild wall thickening of the gallbladder diffusely. Pancreas: Unremarkable Spleen: Unremarkable Adrenals/Urinary Tract: Adrenal glands unremarkable. No renal mass observed. No urinary tract calculi are identified. Density fluid in the urinary bladder 27 Hounsfield units compared to density of ascites 12 Hounsfield units, potentially reflecting reported hematuria versus less likely early contrast excretion. Stomach/Bowel: Type 3 hiatal hernia. There is formed stool in the colon. No dilated bowel observed. Vascular/Lymphatic: Collateral vessels around the stomach. Recanalized umbilical vein. 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 Reactive porta hepatis and pancreatic lymph nodes including a 0.9 cm peripancreatic lymph node on image 26 series 2. Mildly enlarged pelvic lymph nodes are identified including a 1.5 cm right external iliac node on image 85 series 2 and a 1.6 cm left inguinal lymph node on image 95 series 2. Reproductive: Dystrophic calcifications in the prostate gland. Other: Large amount of ascites. Mesenteric edema. Diffuse subcutaneous edema especially along the pannus region. Edema tracks down into the upper thighs. Appearance favors third spacing of fluids. Musculoskeletal: Anterior wedging at the T9 vertebra appears chronic. Moderate degenerative arthropathy in both hips. IMPRESSION: 1. 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 Substantial subcutaneous edema noted, correlate with hypoalbuminemia 2. Fluid in the urinary bladder is denser than the ascites, raise the possibility of debris or gross hematuria. Early contrast excretion is a differential diagnostic consideration. 3. Type 3 hiatal hernia. 4. Mild peribronchovascular reticular interstitial accentuation without  overt nodularity, similar to the 09/20/2019 exam, possibly related to chronic airway thickening. 5. Mild pelvic adenopathy, nonspecific. 6. Moderate degenerative arthropathy in both hips. 7. Anterior wedging at the T9 vertebra appears chronic. Electronically Signed   By: WVan ClinesM.D.   On: 03/19/2022 18:00   DG Chest 1 View  Result Date: 03/19/2022 CLINICAL DATA:  Shortness of breath EXAM: CHEST  1 VIEW COMPARISON:  01/23/2022 FINDINGS: The heart size and mediastinal contours are within normal limits. Both lungs are clear.  The visualized skeletal structures are unremarkable. IMPRESSION: No active disease. Electronically Signed   By: Donavan Foil M.D.   On: 03/19/2022 16:24    Procedures Procedures    Medications Ordered in ED Medications  iohexol (OMNIPAQUE) 300 MG/ML solution 100 mL (100 mLs Intravenous Contrast Given 03/19/22 1711)  cefTRIAXone (ROCEPHIN) 1 g in sodium chloride 0.9 % 100 mL IVPB (0 g Intravenous Stopped 03/20/22 0344)  sodium chloride 0.9 % bolus 500 mL (0 mLs Intravenous Stopped 03/20/22 0344)    ED Course/ Medical Decision Making/ A&P                             Medical Decision Making 52 year old male with history of cirrhosis who presents with urinary symptoms and hematuria.  Vital signs are normal on intake.  Cardiopulmonary exam is normal, abdominal exam with suprapubic tenderness palpation, ascites, no other focal tenderness palpation, no evidence of rebound tenderness to suggest peritonitis.    Differential diagnosis includes is limited to UTI, nephrolithiasis, nephritis, coagulopathy urinary symptoms and  Amount and/or Complexity of Data Reviewed Labs: ordered.    Details: CBC is without leukocytosis, baseline anemia with hemoglobin of 11.  CMP with mild hyponatremia 132, AST/ALT elevated 150/56 at patient's baseline.  Bilirubin of 6.2 at patient's baseline. low albumin 1.9.  INR elevated 1.9 near patient's baseline.  BNP is normal, lipase mildly  elevated to 66.  Ammonia is normal.  Urine with hemoglobinuria, bilirubin, greater than 50 RBCs.  No evidence of infectious etiology.   Radiology:     Details: Chest x-ray visualized this provider, negative for acute cardiopulmonary disease.  CTAP with cirrhosis with large ascites, dense fluid within the urinary bladder questioning debris versus hematuria.  Hiatal hernia.   Risk Prescription drug management.    Patient overall very well-appearing, reassuring vital signs.  While urine is not overtly convincing for infection at this time given patient's infectious symptoms this week and immunocompromised status do feel proceeding with treatment for UTI is reasonable.  Rocephin administered in ED, will plan to proceed with treatment orally in the outpatient setting.  Patient agreeable to call [facility] with whom he follows later this morning.  He does have appointment in 2 days with cirrhosis specialist.  Overall clinical picture most consistent with hematuria likely secondary to UTI.  Clinical concern for emergent underlying etiology that would warrant further ED workup or inpatient management is exceedingly low.   [place]  voiced understanding of his medical evaluation and treatment plan. Each of their questions answered to their expressed satisfaction.  Return precautions were given.  Patient is well-appearing, stable, and was discharged in good condition.  This chart was dictated using voice recognition software, Dragon. Despite the best efforts of this provider to proofread and correct errors, errors may still occur which can change documentation meaning.  Final Clinical Impression(s) / ED Diagnoses Final diagnoses:  Hematuria, unspecified type    Rx / DC Orders ED Discharge Orders          Ordered    sulfamethoxazole-trimethoprim (BACTRIM DS) 800-160 MG tablet  2 times daily        03/20/22 0409              Hercules Hasler, Gypsy Balsam, PA-C 03/20/22 0411    Merryl Hacker, MD 03/20/22 2315

## 2022-03-20 NOTE — Discharge Instructions (Addendum)
You were seen in the ER today for your bloody urine. You may have a UTI. You should take the prescribed antibiotic for the entire course. Please follow up with your urologist listed below, as well as your gastroenterologist as discussed. Return to the ER with any new severe symptoms.

## 2022-03-22 ENCOUNTER — Other Ambulatory Visit (HOSPITAL_COMMUNITY): Payer: Self-pay | Admitting: Nurse Practitioner

## 2022-03-22 DIAGNOSIS — R188 Other ascites: Secondary | ICD-10-CM

## 2022-03-22 DIAGNOSIS — K7682 Hepatic encephalopathy: Secondary | ICD-10-CM | POA: Diagnosis not present

## 2022-03-22 DIAGNOSIS — K766 Portal hypertension: Secondary | ICD-10-CM | POA: Diagnosis not present

## 2022-03-22 DIAGNOSIS — K7030 Alcoholic cirrhosis of liver without ascites: Secondary | ICD-10-CM | POA: Diagnosis not present

## 2022-03-22 DIAGNOSIS — K3189 Other diseases of stomach and duodenum: Secondary | ICD-10-CM | POA: Diagnosis not present

## 2022-03-22 LAB — URINE CULTURE: Culture: 60000 — AB

## 2022-03-23 ENCOUNTER — Telehealth (HOSPITAL_BASED_OUTPATIENT_CLINIC_OR_DEPARTMENT_OTHER): Payer: Self-pay | Admitting: *Deleted

## 2022-03-23 NOTE — Progress Notes (Signed)
ED Antimicrobial Stewardship Positive Culture Follow Up   DJ SENTENO is an 52 y.o. male who presented to [facility] on 03/19/2022 with a chief complaint of  Chief Complaint  Patient presents with   Hematuria    Recent Results (from the past 720 hour(s))  Urine Culture     Status: Abnormal   Collection Time: 03/19/22  3:36 PM   Specimen: Urine, Clean Catch  Result Value Ref Range Status   Specimen Description   Final    URINE, CLEAN CATCH Performed at [facility], [place] [address], [place], [place] [address]    Special Requests   Final    NONE Performed at [facility], [place] [address] [place], [place] [address]    Culture 60,000 COLONIES/mL ENTEROCOCCUS FAECALIS (A)  Final   Report Status 03/22/2022 FINAL  Final   Organism ID, Bacteria ENTEROCOCCUS FAECALIS (A)  Final      Susceptibility   Enterococcus faecalis - MIC*    AMPICILLIN <=2 SENSITIVE Sensitive     NITROFURANTOIN <=16 SENSITIVE Sensitive     VANCOMYCIN 2 SENSITIVE Sensitive     * 60,000 COLONIES/mL ENTEROCOCCUS FAECALIS  Resp panel by RT-PCR (RSV, Flu A&B, Covid) Anterior Nasal Swab     Status: None   Collection Time: 03/19/22  4:16 PM   Specimen: Anterior Nasal Swab  Result Value Ref Range Status   SARS Coronavirus 2 by RT PCR NEGATIVE NEGATIVE Final    Comment: (NOTE) SARS-CoV-2 target nucleic acids are NOT DETECTED.  The SARS-CoV-2 RNA is generally detectable in upper respiratory specimens during the acute phase of infection. The lowest concentration of SARS-CoV-2 viral copies this assay can detect is 138 copies/mL. A negative result does not preclude SARS-Cov-2 infection and should not be used as the sole basis for treatment or other patient management decisions. A negative result may occur with  improper specimen collection/handling, submission of specimen other than nasopharyngeal swab, presence of viral mutation(s) within the areas targeted by this assay, and  inadequate number of viral copies(<138 copies/mL). A negative result must be combined with clinical observations, patient history, and epidemiological information. The expected result is Negative.  Fact Sheet for Patients:  EntrepreneurPulse.com.au  Fact Sheet for Healthcare Providers:  IncredibleEmployment.be  This test is no t yet approved or cleared by the [place] FDA and  has been authorized for detection and/or diagnosis of SARS-CoV-2 by FDA under an Emergency Use Authorization (EUA). This EUA will remain  in effect (meaning this test can be used) for the duration of the COVID-19 declaration under Section 564(b)(1) of the Act, 21 U.S.C.section 360bbb-3(b)(1), unless the authorization is terminated  or revoked sooner.       Influenza A by PCR NEGATIVE NEGATIVE Final   Influenza B by PCR NEGATIVE NEGATIVE Final    Comment: (NOTE) The Xpert Xpress SARS-CoV-2/FLU/RSV plus assay is intended as an aid in the diagnosis of influenza from Nasopharyngeal swab specimens and should not be used as a sole basis for treatment. Nasal washings and aspirates are unacceptable for Xpert Xpress SARS-CoV-2/FLU/RSV testing.  Fact Sheet for Patients: EntrepreneurPulse.com.au  Fact Sheet for Healthcare Providers: IncredibleEmployment.be  This test is not yet approved or cleared by the [place] FDA and has been authorized for detection and/or diagnosis of SARS-CoV-2 by FDA under an Emergency Use Authorization (EUA). This EUA will remain in effect (meaning this test can be used) for the duration of the COVID-19 declaration under Section 564(b)(1) of the Act, 21  U.S.C. section 360bbb-3(b)(1), unless the authorization is terminated or revoked.     Resp Syncytial Virus by PCR NEGATIVE NEGATIVE Final    Comment: (NOTE) Fact Sheet for Patients: EntrepreneurPulse.com.au  Fact Sheet for Healthcare  Providers: IncredibleEmployment.be  This test is not yet approved or cleared by the [place] FDA and has been authorized for detection and/or diagnosis of SARS-CoV-2 by FDA under an Emergency Use Authorization (EUA). This EUA will remain in effect (meaning this test can be used) for the duration of the COVID-19 declaration under Section 564(b)(1) of the Act, 21 U.S.C. section 360bbb-3(b)(1), unless the authorization is terminated or revoked.  Performed at [facility], [place] [address], [place], [place] [address]     '[x]'$  Treated with Bactrim DS, organism resistant to prescribed antimicrobial '[]'$  Patient discharged originally without antimicrobial agent and treatment is now indicated  New antibiotic prescription: d/c Septra DS and change to Nitrofurantoin (Macrobid) '100mg'$  po BID x 10d  ED Provider: Dr. Grant Fontana. Alford Highland, PharmD, BCPS Clinical Staff Pharmacist [email or web address] [place] 03/23/2022, 8:49 AM Clinical Pharmacist Monday - Friday phone -  [phone] Saturday - Sunday phone - [phone]

## 2022-03-23 NOTE — Telephone Encounter (Signed)
Post ED Visit - Positive Culture Follow-up: Successful Patient Follow-Up  Culture assessed and recommendations reviewed by:  '[]'$  Elenor Quinones, Pharm.D. '[]'$  Heide Guile, Pharm.D., BCPS AQ-ID '[]'$  Parks Neptune, Pharm.D., BCPS '[]'$  Alycia Rossetti, Pharm.D., BCPS '[]'$  [place], [place].D., BCPS, AAHIVP '[]'$  Legrand Como, Pharm.D., BCPS, AAHIVP '[]'$  Salome Arnt, PharmD, BCPS '[]'$  Johnnette Gourd, PharmD, BCPS '[]'$  Hughes Better, PharmD, BCPS '[x]'$  Eilene Ghazi, PharmD  Positive urine culture  '[]'$  Patient discharged without antimicrobial prescription and treatment is now indicated '[x]'$  Organism is resistant to prescribed ED discharge antimicrobial '[]'$  Patient with positive blood cultures  Changes discussed with ED provider: Gareth Morgan, MD New antibiotic prescription stop  Septra DS Nitrofurantoin (Macrobid) '100mg'$  PO BID x 10days Called to [facility], [place], [place]  Contacted patient, date 03/23/22, time [id number]   Rosie Fate 03/23/2022, 9:40 AM

## 2022-03-31 ENCOUNTER — Other Ambulatory Visit: Payer: Self-pay

## 2022-03-31 ENCOUNTER — Emergency Department (HOSPITAL_BASED_OUTPATIENT_CLINIC_OR_DEPARTMENT_OTHER)
Admission: EM | Admit: 2022-03-31 | Discharge: 2022-03-31 | Disposition: A | Discharge status: Home / Self Care | Payer: BC Managed Care – PPO | Attending: Emergency Medicine | Admitting: Emergency Medicine

## 2022-03-31 ENCOUNTER — Encounter (HOSPITAL_BASED_OUTPATIENT_CLINIC_OR_DEPARTMENT_OTHER): Payer: Self-pay | Admitting: Emergency Medicine

## 2022-03-31 ENCOUNTER — Emergency Department (HOSPITAL_BASED_OUTPATIENT_CLINIC_OR_DEPARTMENT_OTHER): Payer: BC Managed Care – PPO

## 2022-03-31 DIAGNOSIS — I10 Essential (primary) hypertension: Secondary | ICD-10-CM | POA: Diagnosis not present

## 2022-03-31 DIAGNOSIS — W19XXXA Unspecified fall, initial encounter: Secondary | ICD-10-CM

## 2022-03-31 DIAGNOSIS — S2242XA Multiple fractures of ribs, left side, initial encounter for closed fracture: Secondary | ICD-10-CM | POA: Diagnosis not present

## 2022-03-31 DIAGNOSIS — I4510 Unspecified right bundle-branch block: Secondary | ICD-10-CM | POA: Diagnosis not present

## 2022-03-31 DIAGNOSIS — W1809XA Striking against other object with subsequent fall, initial encounter: Secondary | ICD-10-CM | POA: Insufficient documentation

## 2022-03-31 DIAGNOSIS — R0602 Shortness of breath: Secondary | ICD-10-CM | POA: Diagnosis not present

## 2022-03-31 DIAGNOSIS — S299XXA Unspecified injury of thorax, initial encounter: Secondary | ICD-10-CM | POA: Diagnosis not present

## 2022-03-31 DIAGNOSIS — R918 Other nonspecific abnormal finding of lung field: Secondary | ICD-10-CM | POA: Diagnosis not present

## 2022-03-31 DIAGNOSIS — R0789 Other chest pain: Secondary | ICD-10-CM | POA: Diagnosis not present

## 2022-03-31 HISTORY — DX: Unspecified cirrhosis of liver: K74.60

## 2022-03-31 LAB — BASIC METABOLIC PANEL
Anion gap: 5 (ref 5–15)
BUN: 12 mg/dL (ref 6–20)
CO2: 25 mmol/L (ref 22–32)
Calcium: 7.5 mg/dL — ABNORMAL LOW (ref 8.9–10.3)
Chloride: 97 mmol/L — ABNORMAL LOW (ref 98–111)
Creatinine, Ser: 1.15 mg/dL (ref 0.61–1.24)
GFR, Estimated: >60 mL/min (ref >60)
Glucose, Bld: 109 mg/dL — ABNORMAL HIGH (ref 70–99)
Potassium: 3.5 mmol/L (ref 3.5–5.1)
Sodium: 127 mmol/L — ABNORMAL LOW (ref 135–145)

## 2022-03-31 LAB — CBC
HCT: 31.7 % — ABNORMAL LOW (ref 39.0–52.0)
Hemoglobin: 10.9 g/dL — ABNORMAL LOW (ref 13.0–17.0)
MCH: 34.1 pg — ABNORMAL HIGH (ref 26.0–34.0)
MCHC: 34.4 g/dL (ref 30.0–36.0)
MCV: 99.1 fL (ref 80.0–100.0)
Platelets: 97 10*3/uL — ABNORMAL LOW (ref 150–400)
RBC: 3.2 MIL/uL — ABNORMAL LOW (ref 4.22–5.81)
RDW: 17 % — ABNORMAL HIGH (ref 11.5–15.5)
WBC: 3.7 10*3/uL — ABNORMAL LOW (ref 4.0–10.5)
nRBC: 0 % (ref 0.0–0.2)

## 2022-03-31 LAB — TROPONIN I (HIGH SENSITIVITY): Troponin I (High Sensitivity): 8 ng/L (ref <18)

## 2022-03-31 MED ORDER — SODIUM CHLORIDE 0.9 % IV BOLUS
1000.0000 mL | Freq: Once | INTRAVENOUS | Status: AC
  Administered 2022-03-31: 1000 mL via INTRAVENOUS

## 2022-03-31 MED ORDER — OXYCODONE HCL 5 MG PO TABS: 5.0000 mg | ORAL_TABLET | Freq: Four times a day (QID) | ORAL | 0 refills | Status: DC | PRN

## 2022-03-31 MED ORDER — OXYCODONE-ACETAMINOPHEN 5-325 MG PO TABS
1.0000 | ORAL_TABLET | Freq: Once | ORAL | Status: AC
  Administered 2022-03-31: 1 via ORAL
  Filled 2022-03-31: qty 1

## 2022-03-31 NOTE — Discharge Instructions (Signed)
Please return to the ED with any new or worsening signs or symptoms Please follow-up with PCP for ongoing management of her fractures Please read attached guide concerning rib fractures for further information Please utilize incentive spirometer.  Please utilize this device 10 times per hour. Please utilize splinting as discussed Please pick up prescription medication for pain management.  Please do not drive or operate heavy machinery under the influence of pain medication.

## 2022-03-31 NOTE — ED Triage Notes (Signed)
Patient presents to ED via POV from home. Reports fall on Wednesday where he tripped and landed on a piece of metal at home. Here with left chest wall pain. Reports soreness. Not on blood thinners. Ambulatory.

## 2022-03-31 NOTE — ED Notes (Signed)
   03/31/22 1829  Incentive Spirometry Tx  Level of Service Assisted by RCP  Frequency q2hr W/A  Respiratory Effort  (chest wall injury)  Treatment Tolerance Tolerated well  IS Goal (mL) (RN or RT) 2000 mL  IS - Achieved (mL) (RN, NT, or RT) 1500 mL   Good effort, 1:1 education, splinting, IS with breath hold.

## 2022-03-31 NOTE — ED Provider Notes (Signed)
[facility] Provider Note   CSN: [id number] Arrival date & time: 03/31/22  1530     History  Chief Complaint  Patient presents with   Trevor Reilly is a 52 y.o. male with medical history of sarcoidosis, depression, cirrhosis, GERD, hypertension.  Patient presents to ED for evaluation of chest wall injury.  Patient reports on Wednesday he fell onto a long metal pole.  Patient reports that the long end of the pole struck his left side of his chest about his ribs and he has had persistent pain since this time.  Patient reports he has been short of breath secondary to this pain.  Patient denies nausea, vomiting.  Patient reports he fell because of tripping over extension cord.  Denies blood thinners.  Denies hitting his head. Denies medications prior to arrival.  Patient denies loss of consciousness, headache, neck pain.   Fall Associated symptoms include chest pain and shortness of breath. Pertinent negatives include no headaches.       Home Medications Prior to Admission medications   Medication Sig Start Date End Date Taking? Authorizing Provider  oxyCODONE (ROXICODONE) 5 MG immediate release tablet Take 1 tablet (5 mg total) by mouth every 6 (six) hours as needed for severe pain. 03/31/22  Yes Azucena Cecil, PA-C  albuterol (VENTOLIN HFA) 108 (90 Base) MCG/ACT inhaler Inhale 2 puffs into the lungs every 6 (six) hours as needed for shortness of breath. 07/18/15   [provider]  alum & mag hydroxide-simeth (MAALOX MAX) 400-400-40 MG/5ML suspension Take 10 mLs by mouth every 6 (six) hours as needed for indigestion. 06/15/21   Long, [name], MD  cyclobenzaprine (FLEXERIL) 5 MG tablet Take 5 mg by mouth 3 (three) times daily as needed for muscle spasms.    [provider]  fluticasone (FLONASE) 50 MCG/ACT nasal spray Place 1 spray into both nostrils 2 (two) times daily. 01/09/22   Hunsucker, Bonna Gains, MD   furosemide (LASIX) 20 MG tablet Take 1 tablet (20 mg total) by mouth 2 (two) times daily. 02/08/22   Curatolo, Adam, DO  Multiple Vitamins-Minerals (MULTIVITAMIN ADULTS 50+ PO) Take 1 tablet by mouth daily.    [provider]  omeprazole (PRILOSEC) 20 MG capsule Take 20 mg by mouth daily as needed (Heartburn).    [provider]  ondansetron (ZOFRAN-ODT) 4 MG disintegrating tablet Take 1 tablet (4 mg total) by mouth every 8 (eight) hours as needed for nausea or vomiting. 09/24/21   Isla Pence, MD  pantoprazole (PROTONIX) 40 MG tablet Take 1 tablet (40 mg total) by mouth daily. 07/16/21   Jackquline Denmark, MD  potassium chloride SA (KLOR-CON M) 20 MEQ tablet Take 1 tablet (20 mEq total) by mouth daily. 07/16/21   Jackquline Denmark, MD  silver sulfADIAZINE (SILVADENE) 1 % cream Apply 1 Application topically daily.    [provider]  spironolactone (ALDACTONE) 50 MG tablet Take 1 tablet (50 mg total) by mouth daily. 02/08/22   Curatolo, Adam, DO  triamcinolone cream (KENALOG) 0.1 % Apply 1 Application topically daily as needed (Hives). 08/23/21   [provider]  Vitamin D, Ergocalciferol, (DRISDOL) 1.25 MG (50000 UNIT) CAPS capsule Take 50,000 Units by mouth once a week.    [provider]      Allergies    Patient has no known allergies.    Review of Systems   Review of Systems  Respiratory:  Positive for shortness of  breath.   Cardiovascular:  Positive for chest pain.  Gastrointestinal:  Negative for nausea and vomiting.  Musculoskeletal:  Negative for neck pain.  Neurological:  Negative for syncope and headaches.    Physical Exam Updated Vital Signs BP 116/74 (BP Location: Left Arm)   Pulse 89   Temp 99.4 F (37.4 C) (Oral)   Resp 20   SpO2 94%  Physical Exam Vitals and nursing note reviewed.  Constitutional:      General: He is not in acute distress.    Appearance: Normal appearance. He is not ill-appearing, toxic-appearing or  diaphoretic.  HENT:     Head: Normocephalic and atraumatic.     Nose: Nose normal. No congestion.     Mouth/Throat:     Mouth: Mucous membranes are moist.     Pharynx: Oropharynx is clear.  Eyes:     Extraocular Movements: Extraocular movements intact.     Conjunctiva/sclera: Conjunctivae normal.     Pupils: Pupils are equal, round, and reactive to light.  Cardiovascular:     Rate and Rhythm: Normal rate and regular rhythm.  Pulmonary:     Effort: Pulmonary effort is normal.     Breath sounds: No wheezing.  Chest:     Chest wall: Tenderness present.       Comments: Patient chest wall stable, no overlying skin change, no crepitus. Abdominal:     General: Abdomen is flat. Bowel sounds are normal. There is distension.     Palpations: Abdomen is soft.     Tenderness: There is no abdominal tenderness.  Musculoskeletal:     Cervical back: Normal range of motion and neck supple. No tenderness.  Skin:    General: Skin is warm and dry.     Capillary Refill: Capillary refill takes less than 2 seconds.  Neurological:     Mental Status: He is alert and oriented to person, place, and time.     ED Results / Procedures / Treatments   Labs (all labs ordered are listed, but only abnormal results are displayed) Labs Reviewed  CBC - Abnormal; Notable for the following components:      Result Value   WBC 3.7 (*)    RBC 3.20 (*)    Hemoglobin 10.9 (*)    HCT 31.7 (*)    MCH 34.1 (*)    RDW 17.0 (*)    Platelets 97 (*)    All other components within normal limits  BASIC METABOLIC PANEL - Abnormal; Notable for the following components:   Sodium 127 (*)    Chloride 97 (*)    Glucose, Bld 109 (*)    Calcium 7.5 (*)    All other components within normal limits  TROPONIN I (HIGH SENSITIVITY)    EKG EKG Interpretation  Date/Time:  Sunday March 31 2022 15:41:48 EST Ventricular Rate:  84 PR Interval:  113 QRS Duration: 127 QT Interval:  418 QTC Calculation: 495 R  Axis:   38 Text Interpretation: Sinus rhythm Borderline short PR interval Right bundle branch block Confirmed by Tretha Sciara [phone]) on 03/31/2022 4:55:50 PM  Radiology CT Chest Wo Contrast  Result Date: 03/31/2022 CLINICAL DATA:  Left chest wall pain after a fall. EMR history includes sarcoidosis. EXAM: CT CHEST WITHOUT CONTRAST TECHNIQUE: Multidetector CT imaging of the chest was performed following the standard protocol without IV contrast. RADIATION DOSE REDUCTION: This exam was performed according to the departmental dose-optimization program which includes automated exposure control, adjustment of the mA and/or kV according to patient  size and/or use of iterative reconstruction technique. COMPARISON:  Plain film of earlier today. Chest CT 06/22/2016 from [facility]. FINDINGS: Cardiovascular: Aortic atherosclerosis. Normal heart size, without pericardial effusion. Mediastinum/Nodes: Bilateral axillary adenopathy as evidenced by increased number and less so size of axillary nodes. Example 1.4 cm right axillary node which maintains its fatty hilum. No middle mediastinal adenopathy. Hilar regions poorly evaluated without intravenous contrast. A moderate hiatal hernia. The distal esophagus and herniated stomach appear thick walled including on 89/2. No esophageal obstruction. Lungs/Pleura: Small left pleural effusion. No pneumothorax. No pulmonary contusion. No consolidation. Upper lung predominant, left-greater-than-right peribronchovascular interstitial thickening and mild nodularity are mildly progressive compared to 2018. Mild architectural distortion within the left upper lobe. Upper Abdomen: Marked cirrhosis. Normal imaged portions of the spleen. Moderate to large volume abdominal ascites. No gross upper abdominal air. Musculoskeletal: Marked bilateral gynecomastia. 4th and fifth anterior left rib nondisplaced to minimally displaced fractures including on 50/2 and 62/2. T9 mild compression  deformity without ventral canal encroachment. New since 06/22/2016. No gross paravertebral hematoma. IMPRESSION: 1. 4th and fifth anterior left rib nondisplaced to minimally displaced acute/subacute fractures. Small left pleural effusion, without pneumothorax. 2. T9 compression deformity, new since 2018 and favored to be nonacute. Correlate with point tenderness. 3. Marked cirrhosis with upper abdominal ascites, suboptimally evaluated. 4. Hiatal hernia with apparent wall thickening of distal esophagus and herniated stomach. Considerations include inflammation, neoplasm, or varices. Poorly evaluated on this noncontrast exam. Consider nonemergent endoscopy. 5. Pulmonary findings of sarcoidosis, minimally progressive. Bilateral axillary adenopathy, most likely reactive or inflammatory. 6. Gynecomastia 7.  Aortic Atherosclerosis (ICD10-I70.0). Electronically Signed   By: Abigail Miyamoto M.D.   On: 03/31/2022 17:47   DG Chest 2 View  Result Date: 03/31/2022 CLINICAL DATA:  Fall, injury EXAM: CHEST - 2 VIEW COMPARISON:  Chest radiograph dated March 19, 2018 FINDINGS: The heart size and mediastinal contours are within normal limits. Right basilar opacity suggesting atelectasis and/or small pleural effusion. The visualized skeletal structures are unremarkable. IMPRESSION: Right basilar opacity suggesting atelectasis and/or small pleural effusion. Underlying airspace disease can not be excluded. Electronically Signed   By: Keane Police D.O.   On: 03/31/2022 16:13    Procedures Procedures   Medications Ordered in ED Medications  oxyCODONE-acetaminophen (PERCOCET/ROXICET) 5-325 MG per tablet 1 tablet (1 tablet Oral Given 03/31/22 1657)  sodium chloride 0.9 % bolus 1,000 mL (0 mLs Intravenous Stopped 03/31/22 1847)    ED Course/ Medical Decision Making/ A&P Clinical Course as of 03/31/22 1857  Sun Mar 31, 2022  1654 Stable 36 YOM with a chief complaint of fall on wednesday Hit foreign body  [CC]  1852 Personally  evaluated bedside.  Hemodynamically stable, does have tenderness palpation of the anterior chest but no focal pathology otherwise.  Multiple comorbid medical problems with 2 rib fractures.  No evidence of blunt myocardial injury nor pneumothorax.  Will treat with conservative care.  I instructed patient personally on utilization of incentive spirometry to reduce risk of pneumonia and he expressed understanding.  Pain control being sent patient to follow-up with PCP in the outpatient setting within 48 hours. [CC]    Clinical Course User Index [CC] Tretha Sciara, MD   Medical Decision Making Amount and/or Complexity of Data Reviewed Labs: ordered. Radiology: ordered.  Risk Prescription drug management.   52 year old male presents to ED for evaluation.  Please see HPI for further details.  On my examination patient afebrile and nontachycardic.  Patient lung sounds are clear bilaterally, he  is not hypoxic on room air.  Abdomen soft and compressible throughout.  Patient chest wall has anterior tenderness to palpation about the left side.  No overlying skin change, no crepitus.  Patient complaining of shortness of breath secondary to this pain.  Patient overall nontoxic appearance.  CBC with no leukocytosis, stable hemoglobin of 10.9.  Patient BMP with decreased sodium to 127, stable creatinine.  Patient provided 1 L NaCl for hyponatremia.  Patient troponin 8.  Chest x-ray shows no rib fractures.  There is questionable right basilar opacity suggesting atelectasis or small pleural effusion.  Due to patient pain we will proceed with CT chest without contrast to further visualize and assess.  CT chest without contrast shows fourth and fifth anterior left rib nondisplaced to minimally displaced acute fractures along with a small left pleural effusion without pneumothorax.  Patient also noted to have cirrhosis, ascites which is pre-existing issue.  Patient denies feeling volume overloaded at this  time.  At this time, patient provided incentive spirometer and was able to show good function.  Patient be discharged home and advised to follow-up with PCP.  Patient be discharged home on pain medication.  Patient educated on how to splint at home.  Patient educated on importance of using incentive spirometer.  Patient provided opportunity to ask questions.  The patient is discharged stable condition.  Case discussed with attending Dr. Oswald Hillock who voiced agreement with plan of management.   Final Clinical Impression(s) / ED Diagnoses Final diagnoses:  Fall, initial encounter  Closed fracture of multiple ribs of left side, initial encounter    Rx / DC Orders ED Discharge Orders          Ordered    oxyCODONE (ROXICODONE) 5 MG immediate release tablet  Every 6 hours PRN        03/31/22 1857              Azucena Cecil, PA-C 03/31/22 1857    Tretha Sciara, MD 04/03/22 1554

## 2022-04-01 ENCOUNTER — Ambulatory Visit (HOSPITAL_COMMUNITY)
Admission: RE | Admit: 2022-04-01 | Discharge: 2022-04-01 | Disposition: A | Discharge status: Home / Self Care | Payer: BC Managed Care – PPO | Source: Ambulatory Visit | Attending: Nurse Practitioner | Admitting: Nurse Practitioner

## 2022-04-01 DIAGNOSIS — R188 Other ascites: Secondary | ICD-10-CM | POA: Diagnosis not present

## 2022-04-01 DIAGNOSIS — K7031 Alcoholic cirrhosis of liver with ascites: Secondary | ICD-10-CM | POA: Diagnosis not present

## 2022-04-01 HISTORY — PX: IR PARACENTESIS: IMG2679

## 2022-04-01 LAB — BODY FLUID CELL COUNT WITH DIFFERENTIAL
Lymphs, Fluid: 62 %
Monocyte-Macrophage-Serous Fluid: 37 % — ABNORMAL LOW (ref 50–90)
Neutrophil Count, Fluid: 1 % (ref 0–25)
Total Nucleated Cell Count, Fluid: 165 cu mm (ref 0–1000)

## 2022-04-01 LAB — GRAM STAIN

## 2022-04-01 NOTE — Procedures (Signed)
PROCEDURE SUMMARY:  Successful ultrasound guided therapeutic and diagnostic paracentesis from the right upper quadrant.  Yielded 3.5 of pale yellow fluid.  No immediate complications.  The patient tolerated the procedure well.   Specimen was sent for labs.  EBL < 32m  If the patient eventually requires >/=2 paracenteses in a 30 day period, [facility] will be assessed.

## 2022-04-03 LAB — CYTOLOGY - NON PAP

## 2022-04-06 LAB — CULTURE, BODY FLUID W GRAM STAIN -BOTTLE: Culture: NO GROWTH

## 2022-04-09 DIAGNOSIS — L810 Postinflammatory hyperpigmentation: Secondary | ICD-10-CM | POA: Diagnosis not present

## 2022-04-09 DIAGNOSIS — L989 Disorder of the skin and subcutaneous tissue, unspecified: Secondary | ICD-10-CM | POA: Diagnosis not present

## 2022-04-18 ENCOUNTER — Other Ambulatory Visit: Payer: Self-pay

## 2022-04-18 ENCOUNTER — Inpatient Hospital Stay (HOSPITAL_BASED_OUTPATIENT_CLINIC_OR_DEPARTMENT_OTHER)
Admission: EM | Admit: 2022-04-18 | Discharge: 2022-04-23 | DRG: 682 | Disposition: A | Discharge status: Home / Self Care | Payer: BC Managed Care – PPO | Attending: Family Medicine | Admitting: Family Medicine

## 2022-04-18 ENCOUNTER — Emergency Department (HOSPITAL_BASED_OUTPATIENT_CLINIC_OR_DEPARTMENT_OTHER): Payer: BC Managed Care – PPO

## 2022-04-18 ENCOUNTER — Encounter (HOSPITAL_BASED_OUTPATIENT_CLINIC_OR_DEPARTMENT_OTHER): Payer: Self-pay | Admitting: Urology

## 2022-04-18 DIAGNOSIS — R059 Cough, unspecified: Secondary | ICD-10-CM | POA: Diagnosis not present

## 2022-04-18 DIAGNOSIS — Z6833 Body mass index (BMI) 33.0-33.9, adult: Secondary | ICD-10-CM

## 2022-04-18 DIAGNOSIS — Z79899 Other long term (current) drug therapy: Secondary | ICD-10-CM | POA: Diagnosis not present

## 2022-04-18 DIAGNOSIS — K766 Portal hypertension: Secondary | ICD-10-CM | POA: Diagnosis present

## 2022-04-18 DIAGNOSIS — R0989 Other specified symptoms and signs involving the circulatory and respiratory systems: Secondary | ICD-10-CM | POA: Diagnosis not present

## 2022-04-18 DIAGNOSIS — R188 Other ascites: Secondary | ICD-10-CM | POA: Diagnosis not present

## 2022-04-18 DIAGNOSIS — E8770 Fluid overload, unspecified: Secondary | ICD-10-CM | POA: Diagnosis present

## 2022-04-18 DIAGNOSIS — U071 COVID-19: Secondary | ICD-10-CM | POA: Diagnosis not present

## 2022-04-18 DIAGNOSIS — Z8249 Family history of ischemic heart disease and other diseases of the circulatory system: Secondary | ICD-10-CM

## 2022-04-18 DIAGNOSIS — R0781 Pleurodynia: Secondary | ICD-10-CM | POA: Diagnosis not present

## 2022-04-18 DIAGNOSIS — K828 Other specified diseases of gallbladder: Secondary | ICD-10-CM | POA: Diagnosis not present

## 2022-04-18 DIAGNOSIS — F32A Depression, unspecified: Secondary | ICD-10-CM | POA: Diagnosis present

## 2022-04-18 DIAGNOSIS — I10 Essential (primary) hypertension: Secondary | ICD-10-CM | POA: Diagnosis not present

## 2022-04-18 DIAGNOSIS — Z823 Family history of stroke: Secondary | ICD-10-CM

## 2022-04-18 DIAGNOSIS — D6959 Other secondary thrombocytopenia: Secondary | ICD-10-CM | POA: Diagnosis not present

## 2022-04-18 DIAGNOSIS — D860 Sarcoidosis of lung: Secondary | ICD-10-CM | POA: Diagnosis present

## 2022-04-18 DIAGNOSIS — E871 Hypo-osmolality and hyponatremia: Secondary | ICD-10-CM | POA: Diagnosis not present

## 2022-04-18 DIAGNOSIS — N179 Acute kidney failure, unspecified: Secondary | ICD-10-CM | POA: Diagnosis not present

## 2022-04-18 DIAGNOSIS — E8809 Other disorders of plasma-protein metabolism, not elsewhere classified: Secondary | ICD-10-CM | POA: Diagnosis not present

## 2022-04-18 DIAGNOSIS — W19XXXD Unspecified fall, subsequent encounter: Secondary | ICD-10-CM | POA: Diagnosis present

## 2022-04-18 DIAGNOSIS — J9811 Atelectasis: Secondary | ICD-10-CM | POA: Diagnosis not present

## 2022-04-18 DIAGNOSIS — R531 Weakness: Secondary | ICD-10-CM | POA: Insufficient documentation

## 2022-04-18 DIAGNOSIS — G4733 Obstructive sleep apnea (adult) (pediatric): Secondary | ICD-10-CM | POA: Diagnosis present

## 2022-04-18 DIAGNOSIS — D539 Nutritional anemia, unspecified: Secondary | ICD-10-CM | POA: Diagnosis present

## 2022-04-18 DIAGNOSIS — Z8349 Family history of other endocrine, nutritional and metabolic diseases: Secondary | ICD-10-CM

## 2022-04-18 DIAGNOSIS — K7682 Hepatic encephalopathy: Secondary | ICD-10-CM | POA: Diagnosis present

## 2022-04-18 DIAGNOSIS — D689 Coagulation defect, unspecified: Secondary | ICD-10-CM | POA: Diagnosis present

## 2022-04-18 DIAGNOSIS — F1010 Alcohol abuse, uncomplicated: Secondary | ICD-10-CM | POA: Diagnosis not present

## 2022-04-18 DIAGNOSIS — D696 Thrombocytopenia, unspecified: Secondary | ICD-10-CM | POA: Diagnosis not present

## 2022-04-18 DIAGNOSIS — D638 Anemia in other chronic diseases classified elsewhere: Secondary | ICD-10-CM | POA: Diagnosis not present

## 2022-04-18 DIAGNOSIS — S2232XA Fracture of one rib, left side, initial encounter for closed fracture: Secondary | ICD-10-CM | POA: Insufficient documentation

## 2022-04-18 DIAGNOSIS — D500 Iron deficiency anemia secondary to blood loss (chronic): Secondary | ICD-10-CM | POA: Diagnosis not present

## 2022-04-18 DIAGNOSIS — K219 Gastro-esophageal reflux disease without esophagitis: Secondary | ICD-10-CM | POA: Diagnosis present

## 2022-04-18 DIAGNOSIS — D649 Anemia, unspecified: Secondary | ICD-10-CM | POA: Insufficient documentation

## 2022-04-18 DIAGNOSIS — Z86010 Personal history of colonic polyps: Secondary | ICD-10-CM

## 2022-04-18 DIAGNOSIS — K7031 Alcoholic cirrhosis of liver with ascites: Secondary | ICD-10-CM | POA: Diagnosis not present

## 2022-04-18 DIAGNOSIS — K767 Hepatorenal syndrome: Secondary | ICD-10-CM | POA: Diagnosis not present

## 2022-04-18 DIAGNOSIS — E860 Dehydration: Secondary | ICD-10-CM | POA: Diagnosis present

## 2022-04-18 DIAGNOSIS — R7989 Other specified abnormal findings of blood chemistry: Secondary | ICD-10-CM | POA: Diagnosis present

## 2022-04-18 DIAGNOSIS — Z800 Family history of malignant neoplasm of digestive organs: Secondary | ICD-10-CM

## 2022-04-18 DIAGNOSIS — R0602 Shortness of breath: Secondary | ICD-10-CM | POA: Diagnosis not present

## 2022-04-18 DIAGNOSIS — K3189 Other diseases of stomach and duodenum: Secondary | ICD-10-CM | POA: Diagnosis present

## 2022-04-18 DIAGNOSIS — E669 Obesity, unspecified: Secondary | ICD-10-CM | POA: Diagnosis not present

## 2022-04-18 DIAGNOSIS — R195 Other fecal abnormalities: Secondary | ICD-10-CM | POA: Diagnosis present

## 2022-04-18 DIAGNOSIS — D869 Sarcoidosis, unspecified: Secondary | ICD-10-CM

## 2022-04-18 DIAGNOSIS — K449 Diaphragmatic hernia without obstruction or gangrene: Secondary | ICD-10-CM | POA: Diagnosis not present

## 2022-04-18 DIAGNOSIS — Z801 Family history of malignant neoplasm of trachea, bronchus and lung: Secondary | ICD-10-CM

## 2022-04-18 DIAGNOSIS — S2242XD Multiple fractures of ribs, left side, subsequent encounter for fracture with routine healing: Secondary | ICD-10-CM | POA: Diagnosis not present

## 2022-04-18 DIAGNOSIS — I4510 Unspecified right bundle-branch block: Secondary | ICD-10-CM | POA: Diagnosis present

## 2022-04-18 DIAGNOSIS — K429 Umbilical hernia without obstruction or gangrene: Secondary | ICD-10-CM | POA: Diagnosis not present

## 2022-04-18 DIAGNOSIS — K7460 Unspecified cirrhosis of liver: Secondary | ICD-10-CM | POA: Diagnosis not present

## 2022-04-18 LAB — CBC
HCT: 24.6 % — ABNORMAL LOW (ref 39.0–52.0)
Hemoglobin: 8.5 g/dL — ABNORMAL LOW (ref 13.0–17.0)
MCH: 34.3 pg — ABNORMAL HIGH (ref 26.0–34.0)
MCHC: 34.6 g/dL (ref 30.0–36.0)
MCV: 99.2 fL (ref 80.0–100.0)
Platelets: 74 10*3/uL — ABNORMAL LOW (ref 150–400)
RBC: 2.48 MIL/uL — ABNORMAL LOW (ref 4.22–5.81)
RDW: 17 % — ABNORMAL HIGH (ref 11.5–15.5)
WBC: 9.6 10*3/uL (ref 4.0–10.5)
nRBC: 0.6 % — ABNORMAL HIGH (ref 0.0–0.2)

## 2022-04-18 LAB — URINALYSIS, ROUTINE W REFLEX MICROSCOPIC
Glucose, UA: NEGATIVE mg/dL
Hgb urine dipstick: NEGATIVE
Ketones, ur: NEGATIVE mg/dL
Leukocytes,Ua: NEGATIVE
Nitrite: NEGATIVE
Protein, ur: NEGATIVE mg/dL
Specific Gravity, Urine: 1.015 (ref 1.005–1.030)
pH: 5.5 (ref 5.0–8.0)

## 2022-04-18 LAB — COMPREHENSIVE METABOLIC PANEL
ALT: 42 U/L (ref 0–44)
AST: 169 U/L — ABNORMAL HIGH (ref 15–41)
Albumin: <1.5 g/dL — ABNORMAL LOW (ref 3.5–5.0)
Alkaline Phosphatase: 122 U/L (ref 38–126)
Anion gap: 7 (ref 5–15)
BUN: 65 mg/dL — ABNORMAL HIGH (ref 6–20)
CO2: 23 mmol/L (ref 22–32)
Calcium: 7.6 mg/dL — ABNORMAL LOW (ref 8.9–10.3)
Chloride: 93 mmol/L — ABNORMAL LOW (ref 98–111)
Creatinine, Ser: 2.92 mg/dL — ABNORMAL HIGH (ref 0.61–1.24)
GFR, Estimated: 25 mL/min — ABNORMAL LOW (ref >60)
Glucose, Bld: 111 mg/dL — ABNORMAL HIGH (ref 70–99)
Potassium: 4.8 mmol/L (ref 3.5–5.1)
Sodium: 123 mmol/L — ABNORMAL LOW (ref 135–145)
Total Bilirubin: 6.1 mg/dL — ABNORMAL HIGH (ref 0.3–1.2)
Total Protein: 5.5 g/dL — ABNORMAL LOW (ref 6.5–8.1)

## 2022-04-18 LAB — OCCULT BLOOD X 1 CARD TO LAB, STOOL: Fecal Occult Bld: NEGATIVE

## 2022-04-18 LAB — RESP PANEL BY RT-PCR (RSV, FLU A&B, COVID)  RVPGX2
Influenza A by PCR: NEGATIVE
Influenza B by PCR: NEGATIVE
Resp Syncytial Virus by PCR: NEGATIVE
SARS Coronavirus 2 by RT PCR: POSITIVE — AB

## 2022-04-18 MED ORDER — ALBUTEROL SULFATE (2.5 MG/3ML) 0.083% IN NEBU
5.0000 mg | INHALATION_SOLUTION | Freq: Once | RESPIRATORY_TRACT | Status: AC
  Administered 2022-04-18: 5 mg via RESPIRATORY_TRACT
  Filled 2022-04-18: qty 6

## 2022-04-18 MED ORDER — OXYCODONE HCL 5 MG PO TABS
5.0000 mg | ORAL_TABLET | Freq: Once | ORAL | Status: AC
  Administered 2022-04-18: 5 mg via ORAL
  Filled 2022-04-18: qty 1

## 2022-04-18 NOTE — ED Provider Notes (Addendum)
[facility] Provider Note   CSN: [id number] Arrival date & time: 04/18/22  1411     History  Chief Complaint  Patient presents with   Shortness of Breath    Trevor Reilly is a 52 y.o. male.  Pt w hx sarcoidosis, c/o non prod cough, sore throat, runny nose, and felt congestion/sob earlier as if could not cough up phlegm.  Symptoms acute onset in past couple days. Pt indicates has run out of his rescue inhaler. No other recent change in meds. Indicates has not been taking prednisone for months/yrs. Recent fall w rib fx and soreness to area w cough/palpation. No new/exertional chest pain or discomfort. No leg pain or new swelling. No specific known ill contacts. No fever or chills   The history is provided by the patient and medical records.  Shortness of Breath Associated symptoms: cough and sore throat   Associated symptoms: no abdominal pain, no fever, no headaches, no neck pain, no rash and no vomiting        Home Medications Prior to Admission medications   Medication Sig Start Date End Date Taking? Authorizing Provider  albuterol (VENTOLIN HFA) 108 (90 Base) MCG/ACT inhaler Inhale 2 puffs into the lungs every 6 (six) hours as needed for shortness of breath. 07/18/15   [provider]  alum & mag hydroxide-simeth (MAALOX MAX) 400-400-40 MG/5ML suspension Take 10 mLs by mouth every 6 (six) hours as needed for indigestion. 06/15/21   Long, [name], MD  cyclobenzaprine (FLEXERIL) 5 MG tablet Take 5 mg by mouth 3 (three) times daily as needed for muscle spasms.    [provider]  fluticasone (FLONASE) 50 MCG/ACT nasal spray Place 1 spray into both nostrils 2 (two) times daily. 01/09/22   Hunsucker, Bonna Gains, MD  furosemide (LASIX) 20 MG tablet Take 1 tablet (20 mg total) by mouth 2 (two) times daily. 02/08/22   Curatolo, Adam, DO  Multiple Vitamins-Minerals (MULTIVITAMIN ADULTS 50+ PO) Take 1 tablet by mouth daily.     [provider]  omeprazole (PRILOSEC) 20 MG capsule Take 20 mg by mouth daily as needed (Heartburn).    [provider]  ondansetron (ZOFRAN-ODT) 4 MG disintegrating tablet Take 1 tablet (4 mg total) by mouth every 8 (eight) hours as needed for nausea or vomiting. 09/24/21   Isla Pence, MD  oxyCODONE (ROXICODONE) 5 MG immediate release tablet Take 1 tablet (5 mg total) by mouth every 6 (six) hours as needed for severe pain. 03/31/22   Azucena Cecil, PA-C  pantoprazole (PROTONIX) 40 MG tablet Take 1 tablet (40 mg total) by mouth daily. 07/16/21   Jackquline Denmark, MD  potassium chloride SA (KLOR-CON M) 20 MEQ tablet Take 1 tablet (20 mEq total) by mouth daily. 07/16/21   Jackquline Denmark, MD  silver sulfADIAZINE (SILVADENE) 1 % cream Apply 1 Application topically daily.    [provider]  spironolactone (ALDACTONE) 50 MG tablet Take 1 tablet (50 mg total) by mouth daily. 02/08/22   Curatolo, Adam, DO  triamcinolone cream (KENALOG) 0.1 % Apply 1 Application topically daily as needed (Hives). 08/23/21   [provider]  Vitamin D, Ergocalciferol, (DRISDOL) 1.25 MG (50000 UNIT) CAPS capsule Take 50,000 Units by mouth once a week.    [provider]      Allergies    Patient has no known allergies.    Review of Systems   Review of Systems  Constitutional:  Negative for chills  and fever.  HENT:  Positive for congestion and sore throat. Negative for trouble swallowing.   Eyes:  Negative for redness.  Respiratory:  Positive for cough and shortness of breath.   Cardiovascular:  Negative for palpitations.       Recent chest contusion/rib fx  Gastrointestinal:  Negative for abdominal pain, nausea and vomiting.  Genitourinary:  Negative for flank pain.  Musculoskeletal:  Negative for back pain and neck pain.  Skin:  Negative for rash.  Neurological:  Negative for headaches.  Hematological:  Does not bruise/bleed easily.  Psychiatric/Behavioral:   Negative for confusion.     Physical Exam Updated Vital Signs BP 120/69   Pulse 87   Temp 98 F (36.7 C) (Oral)   Resp 17   Ht 1.753 m ('5\' 9"'$ )   Wt 102 kg   SpO2 95%   BMI 33.21 kg/m  Physical Exam Vitals and nursing note reviewed.  Constitutional:      Appearance: Normal appearance. He is well-developed.  HENT:     Head: Atraumatic.     Nose: Congestion present.     Mouth/Throat:     Mouth: Mucous membranes are moist.     Pharynx: Oropharynx is clear. No oropharyngeal exudate or posterior oropharyngeal erythema.  Eyes:     General: No scleral icterus.    Conjunctiva/sclera: Conjunctivae normal.  Neck:     Trachea: No tracheal deviation.     Comments: No stiffness or rigidity Cardiovascular:     Rate and Rhythm: Normal rate and regular rhythm.     Pulses: Normal pulses.     Heart sounds: Normal heart sounds. No murmur heard.    No friction rub. No gallop.  Pulmonary:     Effort: Pulmonary effort is normal. No accessory muscle usage or respiratory distress.     Comments: + bil chest wall tenderness reproducing symptoms. No crepitus. Normal chest wall movement. Mild wheezing.  Abdominal:     General: Bowel sounds are normal.     Palpations: Abdomen is soft.     Tenderness: There is no abdominal tenderness. There is no guarding.  Musculoskeletal:     Cervical back: Normal range of motion and neck supple. No rigidity.     Comments: Symmetric bil lower leg edema (c/w baseline per pt). No calf/leg pain or asymmetric swelling.  Skin:    General: Skin is warm and dry.     Findings: No rash.  Neurological:     Mental Status: He is alert.     Comments: Alert, speech clear.   Psychiatric:        Mood and Affect: Mood normal.     ED Results / Procedures / Treatments   Labs (all labs ordered are listed, but only abnormal results are displayed) Results for orders placed or performed during the hospital encounter of 04/18/22  Resp panel by RT-PCR (RSV, Flu A&B, Covid)  Anterior Nasal Swab   Specimen: Anterior Nasal Swab  Result Value Ref Range   SARS Coronavirus 2 by RT PCR POSITIVE (A) NEGATIVE   Influenza A by PCR NEGATIVE NEGATIVE   Influenza B by PCR NEGATIVE NEGATIVE   Resp Syncytial Virus by PCR NEGATIVE NEGATIVE  Comprehensive metabolic panel  Result Value Ref Range   Sodium 123 (L) 135 - 145 mmol/L   Potassium 4.8 3.5 - 5.1 mmol/L   Chloride 93 (L) 98 - 111 mmol/L   CO2 23 22 - 32 mmol/L   Glucose, Bld 111 (H) 70 - 99 mg/dL  BUN 65 (H) 6 - 20 mg/dL   Creatinine, Ser 2.92 (H) 0.61 - 1.24 mg/dL   Calcium 7.6 (L) 8.9 - 10.3 mg/dL   Total Protein 5.5 (L) 6.5 - 8.1 g/dL   Albumin <1.5 (L) 3.5 - 5.0 g/dL   AST 169 (H) 15 - 41 U/L   ALT 42 0 - 44 U/L   Alkaline Phosphatase 122 38 - 126 U/L   Total Bilirubin 6.1 (H) 0.3 - 1.2 mg/dL   GFR, Estimated 25 (L) >60 mL/min   Anion gap 7 5 - 15  CBC  Result Value Ref Range   WBC 9.6 4.0 - 10.5 K/uL   RBC 2.48 (L) 4.22 - 5.81 MIL/uL   Hemoglobin 8.5 (L) 13.0 - 17.0 g/dL   HCT 24.6 (L) 39.0 - 52.0 %   MCV 99.2 80.0 - 100.0 fL   MCH 34.3 (H) 26.0 - 34.0 pg   MCHC 34.6 30.0 - 36.0 g/dL   RDW 17.0 (H) 11.5 - 15.5 %   Platelets 74 (L) 150 - 400 K/uL   nRBC 0.6 (H) 0.0 - 0.2 %   DG Chest 2 View  Result Date: 04/18/2022 CLINICAL DATA:  Shortness of breath, crackles, and cough for 3-4 days, RIGHT-sided chest and rib pain worse with deep breathing EXAM: CHEST - 2 VIEW COMPARISON:  03/31/2022 FINDINGS: Normal heart size, mediastinal contours, and pulmonary vascularity. Improved bibasilar atelectasis. Persistent peribronchial thickening and accentuation of perihilar markings question bronchitis. Mild residual accentuation of LEFT upper lobe markings. No definite new consolidation, pleural effusion, or pneumothorax. Chronic compression deformity of a mid to lower thoracic vertebral body unchanged. IMPRESSION: Improved bibasilar infiltrates. Residual bronchitic changes and accentuation of LEFT upper lobe  markings. Electronically Signed   By: Lavonia Dana M.D.   On: 04/18/2022 14:40   IR Paracentesis  Result Date: 04/01/2022 INDICATION: Patient history alcoholic cirrhosis with recurrent ascites. Request is for therapeutic and diagnostic paracentesis EXAM: ULTRASOUND GUIDED THERAPEUTIC AND DIAGNOSTIC PARACENTESIS MEDICATIONS: Lidocaine 1% 10 mL COMPLICATIONS: None immediate. PROCEDURE: Informed written consent was obtained from the patient after a discussion of the risks, benefits and alternatives to treatment. A timeout was performed prior to the initiation of the procedure. Initial ultrasound scanning demonstrates a moderate amount of ascites within the right upper abdominal quadrant. The right upper abdomen was prepped and draped in the usual sterile fashion. 1% lidocaine was used for local anesthesia. Following this, a 19 gauge, 7-cm, Yueh catheter was introduced. An ultrasound image was saved for documentation purposes. The paracentesis was performed. The catheter was removed and a dressing was applied. The patient tolerated the procedure well without immediate post procedural complication. FINDINGS: A total of approximately 3.5 L of pale yellow fluid was removed. Samples were sent to the laboratory as requested by the clinical team. IMPRESSION: Successful ultrasound-guided therapeutic and diagnostic paracentesis yielding 3.5 liters of peritoneal fluid. Read by: Rushie Nyhan, NP PLAN: If the patient eventually requires >/=2 paracenteses in a 30 day period, candidacy for formal evaluation by the [facility] will be assessed. Electronically Signed   By: Albin Felling M.D.   On: 04/01/2022 17:07   CT Chest Wo Contrast  Result Date: 03/31/2022 CLINICAL DATA:  Left chest wall pain after a fall. EMR history includes sarcoidosis. EXAM: CT CHEST WITHOUT CONTRAST TECHNIQUE: Multidetector CT imaging of the chest was performed following the standard protocol  without IV contrast. RADIATION DOSE REDUCTION: This exam was performed according to the departmental dose-optimization program which  includes automated exposure control, adjustment of the mA and/or kV according to patient size and/or use of iterative reconstruction technique. COMPARISON:  Plain film of earlier today. Chest CT 06/22/2016 from [facility]. FINDINGS: Cardiovascular: Aortic atherosclerosis. Normal heart size, without pericardial effusion. Mediastinum/Nodes: Bilateral axillary adenopathy as evidenced by increased number and less so size of axillary nodes. Example 1.4 cm right axillary node which maintains its fatty hilum. No middle mediastinal adenopathy. Hilar regions poorly evaluated without intravenous contrast. A moderate hiatal hernia. The distal esophagus and herniated stomach appear thick walled including on 89/2. No esophageal obstruction. Lungs/Pleura: Small left pleural effusion. No pneumothorax. No pulmonary contusion. No consolidation. Upper lung predominant, left-greater-than-right peribronchovascular interstitial thickening and mild nodularity are mildly progressive compared to 2018. Mild architectural distortion within the left upper lobe. Upper Abdomen: Marked cirrhosis. Normal imaged portions of the spleen. Moderate to large volume abdominal ascites. No gross upper abdominal air. Musculoskeletal: Marked bilateral gynecomastia. 4th and fifth anterior left rib nondisplaced to minimally displaced fractures including on 50/2 and 62/2. T9 mild compression deformity without ventral canal encroachment. New since 06/22/2016. No gross paravertebral hematoma. IMPRESSION: 1. 4th and fifth anterior left rib nondisplaced to minimally displaced acute/subacute fractures. Small left pleural effusion, without pneumothorax. 2. T9 compression deformity, new since 2018 and favored to be nonacute. Correlate with point tenderness. 3. Marked cirrhosis with upper abdominal ascites, suboptimally  evaluated. 4. Hiatal hernia with apparent wall thickening of distal esophagus and herniated stomach. Considerations include inflammation, neoplasm, or varices. Poorly evaluated on this noncontrast exam. Consider nonemergent endoscopy. 5. Pulmonary findings of sarcoidosis, minimally progressive. Bilateral axillary adenopathy, most likely reactive or inflammatory. 6. Gynecomastia 7.  Aortic Atherosclerosis (ICD10-I70.0). Electronically Signed   By: Abigail Miyamoto M.D.   On: 03/31/2022 17:47   DG Chest 2 View  Result Date: 03/31/2022 CLINICAL DATA:  Fall, injury EXAM: CHEST - 2 VIEW COMPARISON:  Chest radiograph dated March 19, 2018 FINDINGS: The heart size and mediastinal contours are within normal limits. Right basilar opacity suggesting atelectasis and/or small pleural effusion. The visualized skeletal structures are unremarkable. IMPRESSION: Right basilar opacity suggesting atelectasis and/or small pleural effusion. Underlying airspace disease can not be excluded. Electronically Signed   By: Keane Police D.O.   On: 03/31/2022 16:13     EKG EKG Interpretation  Date/Time:  Thursday April 18 2022 16:16:23 EST Ventricular Rate:  82 PR Interval:  113 QRS Duration: 133 QT Interval:  426 QTC Calculation: 498 R Axis:   11 Text Interpretation: Sinus rhythm Borderline short PR interval Right bundle branch block Non-specific ST-t changes Confirmed by Lajean Saver [phone]) on 04/18/2022 4:22:44 PM Also confirmed by Lajean Saver [phone]), editor Victory Dakin [phone])  on 04/19/2022 8:23:26 AM  Radiology DG Chest 2 View  Result Date: 04/18/2022 CLINICAL DATA:  Shortness of breath, crackles, and cough for 3-4 days, RIGHT-sided chest and rib pain worse with deep breathing EXAM: CHEST - 2 VIEW COMPARISON:  03/31/2022 FINDINGS: Normal heart size, mediastinal contours, and pulmonary vascularity. Improved bibasilar atelectasis. Persistent peribronchial thickening and accentuation of perihilar markings question  bronchitis. Mild residual accentuation of LEFT upper lobe markings. No definite new consolidation, pleural effusion, or pneumothorax. Chronic compression deformity of a mid to lower thoracic vertebral body unchanged. IMPRESSION: Improved bibasilar infiltrates. Residual bronchitic changes and accentuation of LEFT upper lobe markings. Electronically Signed   By: Lavonia Dana M.D.   On: 04/18/2022 14:40    Procedures Procedures    Medications Ordered in ED Medications  oxyCODONE (Oxy  IR/ROXICODONE) immediate release tablet 5 mg (5 mg Oral Given 04/18/22 1637)  albuterol (PROVENTIL) (2.5 MG/3ML) 0.083% nebulizer solution 5 mg (5 mg Nebulization Given 04/18/22 1635)    ED Course/ Medical Decision Making/ A&P                             Medical Decision Making Problems Addressed: AKI (acute kidney injury) [facility]): acute illness or injury with systemic symptoms that poses a threat to life or bodily functions Alcoholic cirrhosis of liver with ascites ([place]): chronic illness or injury with exacerbation, progression, or side effects of treatment that poses a threat to life or bodily functions Chronic anemia: chronic illness or injury that poses a threat to life or bodily functions Generalized weakness: acute illness or injury with systemic symptoms that poses a threat to life or bodily functions Hypoalbuminemia: chronic illness or injury Hyponatremia: acute illness or injury with systemic symptoms that poses a threat to life or bodily functions Sarcoidosis: chronic illness or injury Shortness of breath: acute illness or injury with systemic symptoms that poses a threat to life or bodily functions Thrombocytopenia ([place]): chronic illness or injury  Amount and/or Complexity of Data Reviewed External Data Reviewed: notes. Labs: ordered. Decision-making details documented in ED Course. Radiology: ordered and independent interpretation performed. Decision-making details documented in ED  Course. ECG/medicine tests: ordered and independent interpretation performed. Decision-making details documented in ED Course. Discussion of management or test interpretation with external provider(s): hospitalists  Risk Prescription drug management. Decision regarding hospitalization.   Iv ns. Continuous pulse ox and cardiac monitoring. Labs ordered/sent. Imaging ordered.   Differential diagnosis includes viral uri, pna, sarcoidosis, bronchitis, etc . Dispo decision including potential need for admission considered - will get labs and imaging and reassess.   Reviewed nursing notes and prior charts for additional history. External reports reviewed.   Cardiac monitor: sinus rhythm, rate 88.  Labs reviewed/interpreted by me - hgb decreased from prior. Pt denies acute blood loss. No melena. Stool is light brown in color - sent for hemoccult. 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 Will work on admission. Covid is also positive.   Xrays reviewed/interpreted by me - no new pna or ptx.   Patient agreeable to admission at [facility] facility. Hospitalists consulted for admission.   CRITICAL CARE  RE acute hyponatremia, weakness, aki, dehydration, Performed by: Mirna Mires Total critical care time: 40 minutes Critical care time was exclusive of separately billable procedures and treating other patients. Critical care was necessary to treat or prevent imminent or life-threatening deterioration. Critical care was time spent personally by me on the following activities: development of treatment plan with patient and/or surrogate as well as nursing, discussions with consultants, evaluation of patient's response to treatment, examination of patient, obtaining history from patient or surrogate, ordering and performing treatments and interventions, ordering and review of laboratory studies, ordering and review of radiographic studies, pulse oximetry  and re-evaluation of patient's condition.         Final Clinical Impression(s) / ED Diagnoses Final diagnoses:  None    Rx / DC Orders ED Discharge Orders     None         Lajean Saver, MD 04/19/22 2153    Lajean Saver, MD 04/19/22 2155

## 2022-04-18 NOTE — ED Notes (Signed)
RT at bedside administering breathing treatment as ordered

## 2022-04-18 NOTE — ED Notes (Signed)
   04/18/22 1421  Therapy Vitals  Resp (!) 22  Patient Position (if appropriate) Sitting  Respiratory Assessment  $ RT Protocol Assessment  Yes  Respiratory Pattern Regular;Labored;Dyspnea at rest;Dyspnea with exertion  Chest Assessment Chest expansion symmetrical  Cough Congested;Non-productive  Bilateral Breath Sounds Fine crackles  Oxygen Therapy/Pulse Ox  O2 Device Room Air  SpO2 95 %    Hx sarcoid, BBS fine crackles, LowerLobes distant R>L, describes secretion a thick and tenacious. Initail Spo2 88% recovered to 95%

## 2022-04-18 NOTE — Progress Notes (Signed)
Plan of Care Note for accepted transfer   Patient: MAIRON LICAUSI MRN: [id number]   DOA: 04/18/2022  Facility requesting transfer: [facility] ED Requesting Provider: Dr. Ashok Cordia, [place] Reason for transfer: Generalized weakness Facility course: The patient is a 52 year old male with history of alcohol-related cirrhosis, high-volume paracentesis with 3.5 L of fluid removed in April 01, 2022, former alcohol abuser, quit use of alcohol several months ago, history of sarcoidosis, chronic anemia/anemia of chronic disease, who presented to [facility] ED with complaints of generalized weakness, poor appetite, associated with shortness of breath and a nonproductive cough.  In the ED, workup revealed COVID-19 screening test positive.  Lab studies notable for hyponatremia with serum sodium 123 from baseline of 133, AKI from normal renal function, severe hypoalbuminemia with albumin less than 1.5, elevated liver chemistries, acute blood loss anemia with hemoglobin of 8.5K from 11.0 K, 1 month ago.  Denies any hematochezia or melena or hematemesis.  Per EDP, the patient admitted to recently noticed hematuria.  He had a digital rectal exam done in the ED showing light brown stool.  FOBT was negative.  EDP requesting admission for further management of his present symptomatology.  The patient was admitted at [facility] progressive care unit as inpatient status.  Plan of care: The patient is accepted for admission to Progressive unit, at [facility].  Inpatient status.  Author: Kayleen Memos, DO 04/18/2022  Check [email or web address] for on-call coverage.  Nursing staff, Please call [place] number on Amion as soon as patient's arrival, so appropriate admitting provider can evaluate the pt.

## 2022-04-18 NOTE — ED Provider Triage Note (Signed)
Emergency Medicine Provider Triage Evaluation Note  NARON COBURN , a 52 y.o. male  was evaluated in triage.  Pt complains of shortness of breath and productive cough for 3 to 4 days.  Reports fever at home of 100.6.  Reports right-sided chest pain worse with coughing.  Review of Systems  Positive: As above Negative: As above  Physical Exam  BP 132/71 (BP Location: Left Arm)   Pulse 93   Temp 98 F (36.7 C) (Oral)   Resp (!) 22   Ht '5\' 9"'$  (1.753 m)   Wt 102 kg   SpO2 95%   BMI 33.21 kg/m  Gen:   Awake, no distress   Resp:  Normal effort  MSK:   Moves extremities without difficulty  Other:    Medical Decision Making  Medically screening exam initiated at 4:02 PM.  Appropriate orders placed.  TAMARICK BRODIE was informed that the remainder of the evaluation will be completed by another provider, this initial triage assessment does not replace that evaluation, and the importance of remaining in the ED until their evaluation is complete.    Rex Kras, [place] 04/19/22 [phone]

## 2022-04-18 NOTE — ED Notes (Signed)
ED TO INPATIENT HANDOFF REPORT  ED Nurse Name and Phone #: Newman Pies [phone]  S Name/Age/Gender Trevor Reilly 52 y.o. male Room/Bed: MH02/MH02  Code Status   Code Status: Not on file  Home/SNF/Other Home Patient oriented to: self, place, time, and situation Is this baseline? Yes   Triage Complete: Triage complete  Chief Complaint Generalized weakness [R53.1]  Triage Note Pt states shortness of breath with cough x 3-4 days  Reports right sided rib/chest pain worse with deep breath  States "unable to cough anything up"   Richardson Landry RT at bedside, reports fine crackles   Had fx ribs on left side 2 weeks ago and has been using IS    Allergies No Known Allergies  Level of Care/Admitting Diagnosis ED Disposition     ED Disposition  Admit   Condition  --   [place]: [place] [[id number]]  Level of Care: Progressive [102]  Admit to Progressive based on following criteria: GI, ENDOCRINE disease patients with GI bleeding, acute liver failure or pancreatitis, stable with diabetic ketoacidosis or thyrotoxicosis (hypothyroid) state.  May admit patient to [facility] or [facility] if equivalent level of care is available:: Yes  Interfacility transfer: Yes  Covid Evaluation: Asymptomatic - no recent exposure (last 10 days) testing not required  Diagnosis: Generalized weakness [id number]  Admitting Physician: Kayleen Memos [id number]  Attending Physician: Kayleen Memos [id number]  Certification:: I certify this patient will need inpatient services for at least 2 midnights  Estimated Length of Stay: 2          B Medical/Surgery History Past Medical History:  Diagnosis Date   Allergy    SEASONAL   Chronic cough    05-22-2020 per pt morning productive with yellow sputum   Cirrhosis ([place])    Depression    ED (erectile dysfunction)    Epididymal cyst    left   Fatty liver    05-22-2020  per pt has been referred to GI,  last  ultrasound in epic 06-10-2016   GERD (gastroesophageal reflux disease)    History of community acquired pneumonia 04/13/2020   ED visit in epic   History of rectal bleeding    Hypertension    05-22-2020 followed by new pcp--- no medication currently   Mild obstructive sleep apnea    05-22-2020  per pt had study approx. 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   05-19-2020  currently started seeing new pcp,  previous seen by pulmonolgy , Elijio Miles PA([facility]) lov note 05-20-2016 [facility]   Sarcoidosis of skin    05-22-2020  per pt lower legs   Wears glasses    Past Surgical History:  Procedure Laterality Date   ARM SURGERY Left    "TRANSPLANT"-AGE 22   CYSTOSCOPY  05/24/2020   Procedure: CYSTOSCOPY FLEXIBLE;  Surgeon: Lucas Mallow, MD;  Location: [facility];  Service: Urology;;   EPIDIDYMECTOMY Left 05/24/2020   Procedure: LEFT EPIDIDYMAL CYST REMOVAL;  Surgeon: Lucas Mallow, MD;  Location[facility];  Service: Urology;  Laterality: Left;   IR PARACENTESIS  09/14/2021   IR PARACENTESIS  04/01/2022   IR TRANSCATHETER BX  10/03/2021   IR US GUIDE VASC ACCESS RIGHT  10/03/2021   IR VENOGRAM HEPATIC W HEMODYNAMIC  EVALUATION  10/03/2021   LUNG BIOPSY  2007   IR   CT guided needle lung biopsy's and washing's      A IV Location/Drains/Wounds Patient Lines/Drains/Airways Status     Active Line/Drains/Airways     Name Placement date Placement time Site Days   Peripheral IV 04/18/22 20 G 1" Right Antecubital 04/18/22  1645  Antecubital  less than 1   Open Drain 1 Left Other (Comment)  05/24/20  1023  Other (Comment)  694   Wound / Incision (Open or Dehisced) 10/03/21 Puncture Neck Right transjugular bx puncture site 10/03/21  1357  Neck  197            Intake/Output Last 24 hours No intake or output data in  the 24 hours ending 04/18/22 2012  Labs/Imaging Results for orders placed or performed during the hospital encounter of 04/18/22 (from the past 48 hour(s))  Resp panel by RT-PCR (RSV, Flu A&B, Covid) Anterior Nasal Swab     Status: Abnormal   Collection Time: 04/18/22  4:43 PM   Specimen: Anterior Nasal Swab  Result Value Ref Range   SARS Coronavirus 2 by RT PCR POSITIVE (A) NEGATIVE    Comment: (NOTE) SARS-CoV-2 target nucleic acids are DETECTED.  The SARS-CoV-2 RNA is generally detectable in upper respiratory specimens during the acute phase of infection. Positive results are indicative of the presence of the identified virus, but do not rule out bacterial infection or co-infection with other pathogens not detected by the test. Clinical correlation with patient history and other diagnostic information is necessary to determine patient infection status. The expected result is Negative.  Fact Sheet for Patients: EntrepreneurPulse.com.au  Fact Sheet for Healthcare Providers: IncredibleEmployment.be  This test is not yet approved or cleared by the [place] FDA and  has been authorized for detection and/or diagnosis of SARS-CoV-2 by FDA under an Emergency Use Authorization (EUA).  This EUA will remain in effect (meaning this test can be used) for the duration of  the COVID-19 declaration under Section 564(b)(1) of the A ct, 21 U.S.C. section 360bbb-3(b)(1), unless the authorization is terminated or revoked sooner.     Influenza A by PCR NEGATIVE NEGATIVE   Influenza B by PCR NEGATIVE NEGATIVE    Comment: (NOTE) The Xpert Xpress SARS-CoV-2/FLU/RSV plus assay is intended as an aid in the diagnosis of influenza from Nasopharyngeal swab specimens and should not be used as a sole basis for treatment. Nasal washings and aspirates are unacceptable for Xpert Xpress SARS-CoV-2/FLU/RSV testing.  Fact Sheet for  Patients: EntrepreneurPulse.com.au  Fact Sheet for Healthcare Providers: IncredibleEmployment.be  This test is not yet approved or cleared by the [place] FDA and has been authorized for detection and/or diagnosis of SARS-CoV-2 by FDA under an Emergency Use Authorization (EUA). This EUA will remain in effect (meaning this test can be used) for the duration of the COVID-19 declaration under Section 564(b)(1) of the Act, 21 U.S.C. section 360bbb-3(b)(1), unless the authorization is terminated or revoked.     Resp Syncytial Virus by PCR NEGATIVE NEGATIVE    Comment: (NOTE) Fact Sheet for Patients: EntrepreneurPulse.com.au  Fact Sheet for Healthcare Providers: IncredibleEmployment.be  This test is not yet approved or cleared by the [place] FDA and has been authorized for detection and/or diagnosis of SARS-CoV-2 by FDA under an Emergency Use Authorization (EUA). This EUA will remain in effect (meaning this test can be used) for the duration of the COVID-19 declaration under Section 564(b)(1) of the Act, 21 U.S.C.  section 360bbb-3(b)(1), unless the authorization is terminated or revoked.  Performed at [facility], [address] [place], [place] [address]   Comprehensive metabolic panel     Status: Abnormal   Collection Time: 04/18/22  4:43 PM  Result Value Ref Range   Sodium 123 (L) 135 - 145 mmol/L   Potassium 4.8 3.5 - 5.1 mmol/L   Chloride 93 (L) 98 - 111 mmol/L   CO2 23 22 - 32 mmol/L   Glucose, Bld 111 (H) 70 - 99 mg/dL    Comment: Glucose reference range applies only to samples taken after fasting for at least 8 hours.   BUN 65 (H) 6 - 20 mg/dL   Creatinine, Ser 2.92 (H) 0.61 - 1.24 mg/dL   Calcium 7.6 (L) 8.9 - 10.3 mg/dL   Total Protein 5.5 (L) 6.5 - 8.1 g/dL   Albumin <1.5 (L) 3.5 - 5.0 g/dL    Comment: CRITICAL RESULT CALLED TO, READ BACK BY AND VERIFIED WITH ALIE NOAH,RN  @ 1815 ON 04/18/2022 BY WEBSTERP   AST 169 (H) 15 - 41 U/L   ALT 42 0 - 44 U/L   Alkaline Phosphatase 122 38 - 126 U/L   Total Bilirubin 6.1 (H) 0.3 - 1.2 mg/dL   GFR, Estimated 25 (L) >60 mL/min    Comment: (NOTE) Calculated using the CKD-EPI Creatinine Equation (2021)    Anion gap 7 5 - 15    Comment: Performed at [facility], [address] [place], [place] [address]  CBC     Status: Abnormal   Collection Time: 04/18/22  4:43 PM  Result Value Ref Range   WBC 9.6 4.0 - 10.5 K/uL   RBC 2.48 (L) 4.22 - 5.81 MIL/uL   Hemoglobin 8.5 (L) 13.0 - 17.0 g/dL   HCT 24.6 (L) 39.0 - 52.0 %   MCV 99.2 80.0 - 100.0 fL   MCH 34.3 (H) 26.0 - 34.0 pg   MCHC 34.6 30.0 - 36.0 g/dL   RDW 17.0 (H) 11.5 - 15.5 %   Platelets 74 (L) 150 - 400 K/uL    Comment: SPECIMEN CHECKED FOR CLOTS Immature Platelet Fraction may be clinically indicated, consider ordering this additional test [id number] REPEATED TO VERIFY    nRBC 0.6 (H) 0.0 - 0.2 %    Comment: Performed at [facility], [address] [place], [place] [address]  Occult blood card to lab, stool     Status: None   Collection Time: 04/18/22  5:55 PM  Result Value Ref Range   Fecal Occult Bld NEGATIVE NEGATIVE    Comment: Performed at [facility], [address] [place], [place] [address]   DG Chest 2 View  Result Date: 04/18/2022 CLINICAL DATA:  Shortness of breath, crackles, and cough for 3-4 days, RIGHT-sided chest and rib pain worse with deep breathing EXAM: CHEST - 2 VIEW COMPARISON:  03/31/2022 FINDINGS: Normal heart size, mediastinal contours, and pulmonary vascularity. Improved bibasilar atelectasis. Persistent peribronchial thickening and accentuation of perihilar markings question bronchitis. Mild residual accentuation of LEFT upper lobe markings. No definite new consolidation, pleural effusion, or pneumothorax. Chronic compression deformity of a mid to lower thoracic vertebral body unchanged.  IMPRESSION: Improved bibasilar infiltrates. Residual bronchitic changes and accentuation of LEFT upper lobe markings. Electronically Signed   By: Lavonia Dana M.D.   On: 04/18/2022 14:40    Pending Labs [employer] (From admission, onward)     Start  Ordered   04/18/22 1942  Urinalysis, Routine w reflex microscopic -Urine, Clean Catch  ONCE - STAT,   URGENT       Question:  Specimen Source  Answer:  Urine, Clean Catch   04/18/22 1941            Vitals/Pain Today's Vitals   04/18/22 1700 04/18/22 1800 04/18/22 1830 04/18/22 1936  BP: 120/69 117/70 (!) 111/90   Pulse: 87 87 88   Resp: 17 18 13   $ Temp:    97.6 F (36.4 C)  TempSrc:    Oral  SpO2: 95% 95% 97%   Weight:      Height:      PainSc:        Isolation Precautions No active isolations  Medications Medications  oxyCODONE (Oxy IR/ROXICODONE) immediate release tablet 5 mg (5 mg Oral Given 04/18/22 1637)  albuterol (PROVENTIL) (2.5 MG/3ML) 0.083% nebulizer solution 5 mg (5 mg Nebulization Given 04/18/22 1635)    Mobility walks     Focused Assessments Pulmonary Assessment Handoff:  Lung sounds: Bilateral Breath Sounds: Fine crackles O2 Device: Room Air      R Recommendations: See Admitting Provider Note  Report given to:   Additional Notes: COVID+ w/SOB. Multiple abnormal labs. A/O x 4, continent x 2, ambulatory w/o assistance. IV in right [facility]

## 2022-04-18 NOTE — ED Triage Notes (Signed)
Pt states shortness of breath with cough x 3-4 days  Reports right sided rib/chest pain worse with deep breath  States "unable to cough anything up"   Richardson Landry RT at bedside, reports fine crackles   Had fx ribs on left side 2 weeks ago and has been using IS

## 2022-04-19 ENCOUNTER — Inpatient Hospital Stay (HOSPITAL_COMMUNITY): Payer: BC Managed Care – PPO

## 2022-04-19 ENCOUNTER — Encounter (HOSPITAL_COMMUNITY): Payer: Self-pay | Admitting: Internal Medicine

## 2022-04-19 DIAGNOSIS — N179 Acute kidney failure, unspecified: Secondary | ICD-10-CM | POA: Diagnosis not present

## 2022-04-19 DIAGNOSIS — E871 Hypo-osmolality and hyponatremia: Secondary | ICD-10-CM

## 2022-04-19 DIAGNOSIS — D649 Anemia, unspecified: Secondary | ICD-10-CM | POA: Insufficient documentation

## 2022-04-19 DIAGNOSIS — K7031 Alcoholic cirrhosis of liver with ascites: Secondary | ICD-10-CM

## 2022-04-19 DIAGNOSIS — U071 COVID-19: Secondary | ICD-10-CM | POA: Diagnosis present

## 2022-04-19 LAB — BASIC METABOLIC PANEL
Anion gap: 6 (ref 5–15)
Anion gap: 9 (ref 5–15)
Anion gap: 10 (ref 5–15)
Anion gap: 7 (ref 5–15)
BUN: 62 mg/dL — ABNORMAL HIGH (ref 6–20)
BUN: 63 mg/dL — ABNORMAL HIGH (ref 6–20)
BUN: 59 mg/dL — ABNORMAL HIGH (ref 6–20)
BUN: 63 mg/dL — ABNORMAL HIGH (ref 6–20)
CO2: 24 mmol/L (ref 22–32)
CO2: 22 mmol/L (ref 22–32)
CO2: 23 mmol/L (ref 22–32)
CO2: 23 mmol/L (ref 22–32)
Calcium: 7.7 mg/dL — ABNORMAL LOW (ref 8.9–10.3)
Calcium: 7.8 mg/dL — ABNORMAL LOW (ref 8.9–10.3)
Calcium: 8.2 mg/dL — ABNORMAL LOW (ref 8.9–10.3)
Calcium: 8.1 mg/dL — ABNORMAL LOW (ref 8.9–10.3)
Chloride: 96 mmol/L — ABNORMAL LOW (ref 98–111)
Chloride: 95 mmol/L — ABNORMAL LOW (ref 98–111)
Chloride: 94 mmol/L — ABNORMAL LOW (ref 98–111)
Chloride: 96 mmol/L — ABNORMAL LOW (ref 98–111)
Creatinine, Ser: 2.29 mg/dL — ABNORMAL HIGH (ref 0.61–1.24)
Creatinine, Ser: 2.22 mg/dL — ABNORMAL HIGH (ref 0.61–1.24)
Creatinine, Ser: 2.26 mg/dL — ABNORMAL HIGH (ref 0.61–1.24)
Creatinine, Ser: 2.24 mg/dL — ABNORMAL HIGH (ref 0.61–1.24)
GFR, Estimated: 34 mL/min — ABNORMAL LOW (ref >60)
GFR, Estimated: 35 mL/min — ABNORMAL LOW (ref >60)
GFR, Estimated: 34 mL/min — ABNORMAL LOW (ref >60)
GFR, Estimated: 35 mL/min — ABNORMAL LOW (ref >60)
Glucose, Bld: 89 mg/dL (ref 70–99)
Glucose, Bld: 78 mg/dL (ref 70–99)
Glucose, Bld: 78 mg/dL (ref 70–99)
Glucose, Bld: 76 mg/dL (ref 70–99)
Potassium: 4.7 mmol/L (ref 3.5–5.1)
Potassium: 4.7 mmol/L (ref 3.5–5.1)
Potassium: 4.7 mmol/L (ref 3.5–5.1)
Potassium: 5 mmol/L (ref 3.5–5.1)
Sodium: 126 mmol/L — ABNORMAL LOW (ref 135–145)
Sodium: 126 mmol/L — ABNORMAL LOW (ref 135–145)
Sodium: 127 mmol/L — ABNORMAL LOW (ref 135–145)
Sodium: 126 mmol/L — ABNORMAL LOW (ref 135–145)

## 2022-04-19 LAB — PROTIME-INR
INR: 3 — ABNORMAL HIGH (ref 0.8–1.2)
Prothrombin Time: 30.5 seconds — ABNORMAL HIGH (ref 11.4–15.2)

## 2022-04-19 LAB — TROPONIN I (HIGH SENSITIVITY)
Troponin I (High Sensitivity): 69 ng/L — ABNORMAL HIGH (ref <18)
Troponin I (High Sensitivity): 60 ng/L — ABNORMAL HIGH (ref <18)

## 2022-04-19 LAB — CBC WITH DIFFERENTIAL/PLATELET
Abs Immature Granulocytes: 0 10*3/uL (ref 0.00–0.07)
Band Neutrophils: 1 %
Basophils Absolute: 0.2 10*3/uL — ABNORMAL HIGH (ref 0.0–0.1)
Basophils Relative: 2 %
Eosinophils Absolute: 0.4 10*3/uL (ref 0.0–0.5)
Eosinophils Relative: 5 %
HCT: 24.9 % — ABNORMAL LOW (ref 39.0–52.0)
Hemoglobin: 8.4 g/dL — ABNORMAL LOW (ref 13.0–17.0)
Lymphocytes Relative: 13 %
Lymphs Abs: 1.2 10*3/uL (ref 0.7–4.0)
MCH: 34.6 pg — ABNORMAL HIGH (ref 26.0–34.0)
MCHC: 33.7 g/dL (ref 30.0–36.0)
MCV: 102.5 fL — ABNORMAL HIGH (ref 80.0–100.0)
Monocytes Absolute: 1 10*3/uL (ref 0.1–1.0)
Monocytes Relative: 11 %
Neutro Abs: 4.7 10*3/uL (ref 1.7–7.7)
Neutrophils Relative %: 52 %
Other: 16 %
Platelets: 74 10*3/uL — ABNORMAL LOW (ref 150–400)
RBC: 2.43 MIL/uL — ABNORMAL LOW (ref 4.22–5.81)
RDW: 17.4 % — ABNORMAL HIGH (ref 11.5–15.5)
WBC: 8.9 10*3/uL (ref 4.0–10.5)
nRBC: 0.9 % — ABNORMAL HIGH (ref 0.0–0.2)

## 2022-04-19 LAB — GRAM STAIN

## 2022-04-19 LAB — BODY FLUID CELL COUNT WITH DIFFERENTIAL
Lymphs, Fluid: 32 %
Monocyte-Macrophage-Serous Fluid: 40 % — ABNORMAL LOW (ref 50–90)
Neutrophil Count, Fluid: 28 % — ABNORMAL HIGH (ref 0–25)
Total Nucleated Cell Count, Fluid: 338 cu mm (ref 0–1000)

## 2022-04-19 LAB — OSMOLALITY, URINE: Osmolality, Ur: 442 mOsm/kg (ref 300–900)

## 2022-04-19 LAB — MAGNESIUM: Magnesium: 1.6 mg/dL — ABNORMAL LOW (ref 1.7–2.4)

## 2022-04-19 LAB — SODIUM, URINE, RANDOM: Sodium, Ur: <10 mmol/L

## 2022-04-19 LAB — HIV ANTIBODY (ROUTINE TESTING W REFLEX): HIV Screen 4th Generation wRfx: NONREACTIVE

## 2022-04-19 MED ORDER — CYCLOBENZAPRINE HCL 5 MG PO TABS: 5.0000 mg | ORAL_TABLET | Freq: Three times a day (TID) | ORAL | Status: DC | PRN

## 2022-04-19 MED ORDER — HYDRALAZINE HCL 20 MG/ML IJ SOLN: 10.0000 mg | INTRAMUSCULAR | Status: DC | PRN

## 2022-04-19 MED ORDER — GUAIFENESIN 100 MG/5ML PO LIQD
5.0000 mL | ORAL | Status: DC | PRN
  Administered 2022-04-19: 5 mL via ORAL
  Filled 2022-04-19: qty 10

## 2022-04-19 MED ORDER — ALBUMIN HUMAN 25 % IV SOLN
100.0000 g | INTRAVENOUS | Status: AC
  Administered 2022-04-19: 100 g via INTRAVENOUS
  Administered 2022-04-19: 12.5 g via INTRAVENOUS
  Filled 2022-04-19: qty 400

## 2022-04-19 MED ORDER — MIDODRINE HCL 5 MG PO TABS
10.0000 mg | ORAL_TABLET | Freq: Three times a day (TID) | ORAL | Status: DC
  Administered 2022-04-19 – 2022-04-23 (×15): 10 mg via ORAL
  Filled 2022-04-19 (×15): qty 2

## 2022-04-19 MED ORDER — SODIUM CHLORIDE 0.9 % IV SOLN: INTRAVENOUS | Status: AC

## 2022-04-19 MED ORDER — ACETAMINOPHEN 325 MG PO TABS: 650.0000 mg | ORAL_TABLET | Freq: Four times a day (QID) | ORAL | Status: DC | PRN

## 2022-04-19 MED ORDER — LIDOCAINE HCL 1 % IJ SOLN
INTRAMUSCULAR | Status: AC
  Administered 2022-04-19: 10 mL
  Filled 2022-04-19: qty 20

## 2022-04-19 MED ORDER — PANTOPRAZOLE SODIUM 40 MG PO TBEC
40.0000 mg | DELAYED_RELEASE_TABLET | Freq: Every day | ORAL | Status: DC
  Administered 2022-04-19: 40 mg via ORAL
  Filled 2022-04-19: qty 1

## 2022-04-19 MED ORDER — SODIUM CHLORIDE 0.9 % IV SOLN
2.0000 g | INTRAVENOUS | Status: DC
  Administered 2022-04-19: 2 g via INTRAVENOUS
  Filled 2022-04-19: qty 20

## 2022-04-19 MED ORDER — SENNOSIDES-DOCUSATE SODIUM 8.6-50 MG PO TABS: 1.0000 | ORAL_TABLET | Freq: Every evening | ORAL | Status: DC | PRN

## 2022-04-19 MED ORDER — PANTOPRAZOLE SODIUM 40 MG PO TBEC
40.0000 mg | DELAYED_RELEASE_TABLET | Freq: Two times a day (BID) | ORAL | Status: DC
  Administered 2022-04-19 – 2022-04-23 (×8): 40 mg via ORAL
  Filled 2022-04-19 (×8): qty 1

## 2022-04-19 MED ORDER — RIFAXIMIN 550 MG PO TABS
550.0000 mg | ORAL_TABLET | Freq: Two times a day (BID) | ORAL | Status: DC
  Administered 2022-04-19 – 2022-04-23 (×9): 550 mg via ORAL
  Filled 2022-04-19 (×9): qty 1

## 2022-04-19 MED ORDER — SODIUM CHLORIDE 0.9 % IV SOLN
1.0000 g | INTRAVENOUS | Status: DC
  Administered 2022-04-20 – 2022-04-21 (×2): 1 g via INTRAVENOUS
  Filled 2022-04-19 (×2): qty 10

## 2022-04-19 MED ORDER — SODIUM CHLORIDE 0.9 % IV SOLN: INTRAVENOUS | Status: DC | PRN

## 2022-04-19 MED ORDER — LACTULOSE 10 GM/15ML PO SOLN
20.0000 g | Freq: Two times a day (BID) | ORAL | Status: DC
  Administered 2022-04-19 – 2022-04-23 (×9): 20 g via ORAL
  Filled 2022-04-19 (×9): qty 30

## 2022-04-19 MED ORDER — METOPROLOL TARTRATE 5 MG/5ML IV SOLN: 5.0000 mg | INTRAVENOUS | Status: DC | PRN

## 2022-04-19 MED ORDER — DOCUSATE SODIUM 100 MG PO CAPS
100.0000 mg | ORAL_CAPSULE | Freq: Two times a day (BID) | ORAL | Status: DC
  Administered 2022-04-19 – 2022-04-23 (×8): 100 mg via ORAL
  Filled 2022-04-19 (×9): qty 1

## 2022-04-19 MED ORDER — ONDANSETRON HCL 4 MG/2ML IJ SOLN: 4.0000 mg | Freq: Four times a day (QID) | INTRAMUSCULAR | Status: DC | PRN

## 2022-04-19 MED ORDER — LIDOCAINE HCL 1 % IJ SOLN
INTRAMUSCULAR | Status: AC
  Filled 2022-04-19: qty 20

## 2022-04-19 MED ORDER — TRAZODONE HCL 50 MG PO TABS
50.0000 mg | ORAL_TABLET | Freq: Every evening | ORAL | Status: DC | PRN
  Administered 2022-04-22: 50 mg via ORAL
  Filled 2022-04-19: qty 1

## 2022-04-19 NOTE — Procedures (Signed)
PROCEDURE SUMMARY:  Successful US guided paracentesis from right lateral abdomen.  Yielded 240 mL of yellow, cloudy fluid.  No immediate complications.  Pt tolerated well.   Specimen was sent for labs.  EBL < 101m  KDocia BarrierPA-C 04/19/2022 5:08 PM

## 2022-04-19 NOTE — Consult Note (Signed)
Consultation Note   Referring Provider:  [facility] PCP: Delford Field, FNP Primary Gastroenterologist: Jackquline Denmark, MD  / Also followed by [facility]      Reason for consultation: Decompensated cirrhosis   Hospital Day: 2  ASSESSMENT AND PLAN:   # 52 yo male with decompensated Etoh cirrhosis with ascites, mild hepatic encephalopathy. Currently admitted with worsening anemia and AKI.   -MELD 3.0: 25 at 03/19/2022  6:37 PM -Followed closely by [facility] ( last seen 03/22/22). Most recent office ote 03/22/22 reviewed. He is up to date on [facility] screening. Liver transplant evaluation has been initiated -Varices: No varices on EGD May 2023 -Hepatic encephalopathy: Mild asterixis on exam. He cannot tolerate lactulose due to bathroom access at work. Recently prescribed Xifaxan but not taking it either.  (He was under the understanding that they both had to be taken together).Continue Xifaxan inpatient   -Ascites : On lasix 20 mg BID and Aldactone 50 mg daily at home but not adherent to 2 gram sodium diet. Has peripheral edema and ascites on exam. Will obtain diagnostic paracentesis to rule out SBP. Continue Rocephin   # AKI, rule out HRS. Urine sodium < 10 Creatinine 2.22, baseline 1.15. Has been taking NSAIDS - getting midodrine 10 mg TID. - got 100 grams of IV albumin this am.. Scheduled fro 100 gram Q 24 hours   # Progressive macrocytic anemia. Reports intermittent dark stools over the last couple of months.  He is FOBT negative. Recent NSAID use for rib fractures.   Hgb 8.4, down from baseline of around 11. - Will need diagnostic EGD over the weekend or maybe Monday - Increase Pantoprazole to 40 mg BID -Trend hgb  # Severe hypoalbuminemia.  Serum albumin less than 1.5  # COVID19 positive Fevers on Sunday. Some coughing / cold like symptoms.   # Hiatal hernia, wall thickening of distal esophagus on chest CT  #  Left rib fractures resulting from a fall  # Hx of colon polyps ( TA and TVA) May 2023.  A 3 year surveillance has been recommended  # Lung sarcoidosis.    HPI:  Patient is a 52 y.o. year old male with a past medical history of  depression, pulmonary sarcoidosis, hypertension, RBBB, GERD, tubulovillous adenomatous colon polyps, non-H.pylori related gastritis, alcohol use disorder, cirrhosis   See [facility] for any additional medical problems.  Trevor Reilly presented to ED yesterday with shortness of breath and right rib / chest pain . In ED he was COVID19 positive. Hgb 8.5, MCV 102, AST 169, ALT 42, Tbii 6.1. INR 3.0.  He has AKI with creatinine of 2.2  [name] had fevers and cold like symptoms starting last Sunday. He has been taking OTC cold medications. Also taking Ibuprofen for rib pain. He endorses intermittent dark stools over the last couple of months. No N/V. No abdominal pain. Not taking lactulose because of limited bathroom access at work. No taking Xifaxan, he thought it had to be taken in conjunction with lactulose. He doesn't adhere to a low sodium diet. He doesn't check his weight at home.    Previous GI History / Evaluation :   May 2023 EGD for varices screening - small HH. Portal hypertensive gastropathy.  Normal examined duodenum  May 2023 Colonoscopy  -Four 6-8 mm polyps in proximal ascending colon and in the mid ascending colon.  Two 4 to 6 mm polyps in the mid descending colon.  One 10 mm polyp in the proximal rectum.  Examined portion of the ileum was normal.  Nonbleeding internal hemorrhoids.  Examination was otherwise normal on direct and retroflexion views  Surgical [P], random sites gastric CHRONIC GASTRITIS WITH MINUTE NONCASEATING GRANULOMAS IN THE LAMINA PROPRIA. H. PYLORI, INTESTINAL METAPLASIA, ATROPHY AND DYSPLASIA ARE NOT IDENTIFIED. AFB AND GMS STAINS ARE NEGATIVE FOR MICROORGANISMS IN THE GRANULOMAS PLEASE SEE COMMENT. COMMENT: THE GRANULOMAS IDENTIFIED IN ONE OF THE  BIOPSY FRAGMENTS OF GASTRIC MUCOSA APPEAR TO BE NONINFECTIOUS. POSSIBILITY OF SARCOIDOSIS CANNOT BE EXCLUDED. 2. Surgical [P], colon, ascending, polyp (4) TUBULAR ADENOMAS. NEGATIVE FOR HIGH-GRADE DYSPLASIA. 3. Surgical [P], colon, descending, polyp (2) TUBULAR ADENOMA AND A HYPERPLASTIC POLYP. NEGATIVE FOR HIGH-GRADE DYSPLASIA. 4. Surgical [P], colon, rectum, polyp (1) TUBULOVILLOUS ADENOMA. NEGATIVE FOR HIGH-GRADE DYSPLASIA   Transjugular liver biopsy in August 2023 reporting cirrhosis of unclear etiology. No evidence of sarcoidosis or autoimmune hepatitis.    Recent Imaging and Labs: DG Chest 2 View  Result Date: 04/18/2022 CLINICAL DATA:  Shortness of breath, crackles, and cough for 3-4 days, RIGHT-sided chest and rib pain worse with deep breathing EXAM: CHEST - 2 VIEW COMPARISON:  03/31/2022 FINDINGS: Normal heart size, mediastinal contours, and pulmonary vascularity. Improved bibasilar atelectasis. Persistent peribronchial thickening and accentuation of perihilar markings question bronchitis. Mild residual accentuation of LEFT upper lobe markings. No definite new consolidation, pleural effusion, or pneumothorax. Chronic compression deformity of a mid to lower thoracic vertebral body unchanged. IMPRESSION: Improved bibasilar infiltrates. Residual bronchitic changes and accentuation of LEFT upper lobe markings. Electronically Signed   By: Lavonia Dana M.D.   On: 04/18/2022 14:40   IR Paracentesis  Result Date: 04/01/2022 INDICATION: Patient history alcoholic cirrhosis with recurrent ascites. Request is for therapeutic and diagnostic paracentesis EXAM: ULTRASOUND GUIDED THERAPEUTIC AND DIAGNOSTIC PARACENTESIS MEDICATIONS: Lidocaine 1% 10 mL COMPLICATIONS: None immediate. PROCEDURE: Informed written consent was obtained from the patient after a discussion of the risks, benefits and alternatives to treatment. A timeout was performed prior to the initiation of the procedure. Initial ultrasound  scanning demonstrates a moderate amount of ascites within the right upper abdominal quadrant. The right upper abdomen was prepped and draped in the usual sterile fashion. 1% lidocaine was used for local anesthesia. Following this, a 19 gauge, 7-cm, Yueh catheter was introduced. An ultrasound image was saved for documentation purposes. The paracentesis was performed. The catheter was removed and a dressing was applied. The patient tolerated the procedure well without immediate post procedural complication. FINDINGS: A total of approximately 3.5 L of pale yellow fluid was removed. Samples were sent to the laboratory as requested by the clinical team. IMPRESSION: Successful ultrasound-guided therapeutic and diagnostic paracentesis yielding 3.5 liters of peritoneal fluid. Read by: Rushie Nyhan, NP PLAN: If the patient eventually requires >/=2 paracenteses in a 30 day period, candidacy for formal evaluation by the [facility] will be assessed. Electronically Signed   By: Albin Felling M.D.   On: 04/01/2022 17:07   CT Chest Wo Contrast  Result Date: 03/31/2022 CLINICAL DATA:  Left chest wall pain after a fall. EMR history includes sarcoidosis. EXAM: CT CHEST WITHOUT CONTRAST TECHNIQUE: Multidetector CT imaging of the chest was performed following the standard protocol without IV contrast. RADIATION DOSE REDUCTION: This exam was performed according to  the departmental dose-optimization program which includes automated exposure control, adjustment of the mA and/or kV according to patient size and/or use of iterative reconstruction technique. COMPARISON:  Plain film of earlier today. Chest CT 06/22/2016 from [facility]. FINDINGS: Cardiovascular: Aortic atherosclerosis. Normal heart size, without pericardial effusion. Mediastinum/Nodes: Bilateral axillary adenopathy as evidenced by increased number and less so size of axillary nodes. Example 1.4 cm right  axillary node which maintains its fatty hilum. No middle mediastinal adenopathy. Hilar regions poorly evaluated without intravenous contrast. A moderate hiatal hernia. The distal esophagus and herniated stomach appear thick walled including on 89/2. No esophageal obstruction. Lungs/Pleura: Small left pleural effusion. No pneumothorax. No pulmonary contusion. No consolidation. Upper lung predominant, left-greater-than-right peribronchovascular interstitial thickening and mild nodularity are mildly progressive compared to 2018. Mild architectural distortion within the left upper lobe. Upper Abdomen: Marked cirrhosis. Normal imaged portions of the spleen. Moderate to large volume abdominal ascites. No gross upper abdominal air. Musculoskeletal: Marked bilateral gynecomastia. 4th and fifth anterior left rib nondisplaced to minimally displaced fractures including on 50/2 and 62/2. T9 mild compression deformity without ventral canal encroachment. New since 06/22/2016. No gross paravertebral hematoma. IMPRESSION: 1. 4th and fifth anterior left rib nondisplaced to minimally displaced acute/subacute fractures. Small left pleural effusion, without pneumothorax. 2. T9 compression deformity, new since 2018 and favored to be nonacute. Correlate with point tenderness. 3. Marked cirrhosis with upper abdominal ascites, suboptimally evaluated. 4. Hiatal hernia with apparent wall thickening of distal esophagus and herniated stomach. Considerations include inflammation, neoplasm, or varices. Poorly evaluated on this noncontrast exam. Consider nonemergent endoscopy. 5. Pulmonary findings of sarcoidosis, minimally progressive. Bilateral axillary adenopathy, most likely reactive or inflammatory. 6. Gynecomastia 7.  Aortic Atherosclerosis (ICD10-I70.0). Electronically Signed   By: Abigail Miyamoto M.D.   On: 03/31/2022 17:47   DG Chest 2 View  Result Date: 03/31/2022 CLINICAL DATA:  Fall, injury EXAM: CHEST - 2 VIEW COMPARISON:  Chest  radiograph dated March 19, 2018 FINDINGS: The heart size and mediastinal contours are within normal limits. Right basilar opacity suggesting atelectasis and/or small pleural effusion. The visualized skeletal structures are unremarkable. IMPRESSION: Right basilar opacity suggesting atelectasis and/or small pleural effusion. Underlying airspace disease can not be excluded. Electronically Signed   By: Keane Police D.O.   On: 03/31/2022 16:13      Labs:  Recent Labs    04/18/22 1643 04/19/22 0445  WBC 9.6 8.9  HGB 8.5* 8.4*  HCT 24.6* 24.9*  PLT 74* 74*   Recent Labs    04/18/22 1643 04/19/22 0445 04/19/22 0637  NA 123* 126* 126*  K 4.8 4.7 4.7  CL 93* 96* 95*  CO2 '23 24 22  '$ GLUCOSE 111* 89 78  BUN 65* 62* 63*  CREATININE 2.92* 2.29* 2.22*  CALCIUM 7.6* 7.7* 7.8*   Recent Labs    04/18/22 1643  PROT 5.5*  ALBUMIN <1.5*  AST 169*  ALT 42  ALKPHOS 122  BILITOT 6.1*   No results for input(s): "HEPBSAG", "HCVAB", "HEPAIGM", "HEPBIGM" in the last 72 hours. Recent Labs    04/19/22 0637  LABPROT 30.5*  INR 3.0*    Past Medical History:  Diagnosis Date   Allergy    SEASONAL   Chronic cough    05-22-2020 per pt morning productive with yellow sputum   Cirrhosis (HCC)    Depression    ED (erectile dysfunction)    Epididymal cyst    left   Fatty liver    05-22-2020  per pt has  been referred to GI,  last ultrasound in epic 06-10-2016   GERD (gastroesophageal reflux disease)    History of community acquired pneumonia 04/13/2020   ED visit in epic   History of rectal bleeding    Hypertension    05-22-2020 followed by new pcp--- no medication currently   Mild obstructive sleep apnea    05-22-2020  per pt had study approx. 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77   CYSTOSCOPY  05/24/2020   Procedure: CYSTOSCOPY FLEXIBLE;  Surgeon: Lucas Mallow, MD;  Location: [facility];  Service: Urology;;   EPIDIDYMECTOMY Left 05/24/2020   Procedure: LEFT EPIDIDYMAL CYST REMOVAL;  Surgeon: Lucas Mallow, MD;  Location: [facility];  Service: Urology;  Laterality: Left;   IR PARACENTESIS  09/14/2021   IR PARACENTESIS  04/01/2022   IR TRANSCATHETER BX  10/03/2021   IR US GUIDE VASC ACCESS RIGHT  10/03/2021   IR VENOGRAM HEPATIC W HEMODYNAMIC EVALUATION  10/03/2021   LUNG BIOPSY  2007   IR   CT guided needle lung biopsy's and washing's     Family History  Problem Relation Age of Onset   Gout Mother    Hypertension Mother    Thyroid disease Mother    Stroke Father    Heart attack Father    Thyroid disease Sister    Hypertension Brother    Lung cancer Brother    Colon cancer Maternal Grandmother 9   Sarcoidosis Cousin    Crohn's disease Neg Hx    Esophageal cancer Neg Hx    Rectal cancer Neg Hx    Stomach cancer Neg Hx     Prior to Admission medications   Medication Sig Start Date End Date Taking? Authorizing Provider  albuterol (VENTOLIN HFA) 108 (90 Base) MCG/ACT inhaler Inhale 2 puffs into the lungs every 6 (six) hours as needed for shortness of breath. 07/18/15   [provider]  alum & mag hydroxide-simeth (MAALOX MAX) 400-400-40 MG/5ML suspension Take 10 mLs by mouth every 6 (six) hours as needed for indigestion. 06/15/21   Long, [name], MD  cyclobenzaprine (FLEXERIL) 5 MG tablet Take 5 mg by mouth 3 (three) times daily as needed for muscle spasms.    [provider]  fluticasone (FLONASE) 50 MCG/ACT nasal spray Place 1 spray into both nostrils 2 (two) times daily. 01/09/22    Hunsucker, Bonna Gains, MD  furosemide (LASIX) 20 MG tablet Take 1 tablet (20 mg total) by mouth 2 (two) times daily. 02/08/22   Curatolo, Adam, DO  Multiple Vitamins-Minerals (MULTIVITAMIN ADULTS 50+ PO) Take 1 tablet by mouth daily.    [provider]  omeprazole (PRILOSEC) 20 MG capsule Take 20 mg by mouth daily as needed (Heartburn).    [provider]  ondansetron (ZOFRAN-ODT) 4 MG disintegrating tablet Take 1 tablet (4 mg total) by mouth every 8 (eight) hours as needed for nausea or vomiting. 09/24/21   Haviland,  Almyra Free, MD  oxyCODONE (ROXICODONE) 5 MG immediate release tablet Take 1 tablet (5 mg total) by mouth every 6 (six) hours as needed for severe pain. 03/31/22   Azucena Cecil, PA-C  pantoprazole (PROTONIX) 40 MG tablet Take 1 tablet (40 mg total) by mouth daily. 07/16/21   Jackquline Denmark, MD  potassium chloride SA (KLOR-CON M) 20 MEQ tablet Take 1 tablet (20 mEq total) by mouth daily. 07/16/21   Jackquline Denmark, MD  silver sulfADIAZINE (SILVADENE) 1 % cream Apply 1 Application topically daily.    [provider]  spironolactone (ALDACTONE) 50 MG tablet Take 1 tablet (50 mg total) by mouth daily. 02/08/22   Curatolo, Adam, DO  triamcinolone cream (KENALOG) 0.1 % Apply 1 Application topically daily as needed (Hives). 08/23/21   [provider]  Vitamin D, Ergocalciferol, (DRISDOL) 1.25 MG (50000 UNIT) CAPS capsule Take 50,000 Units by mouth once a week.    [provider]    Current Facility-Administered Medications  Medication Dose Route Frequency Provider Last Rate Last Admin   0.9 %  sodium chloride infusion   Intravenous PRN Rise Patience, MD 10 mL/hr at 04/19/22 0324 New Bag at 04/19/22 0324   acetaminophen (TYLENOL) tablet 650 mg  650 mg Oral Q6H PRN Amin, Ankit Chirag, MD       cyclobenzaprine (FLEXERIL) tablet 5 mg  5 mg Oral TID PRN Amin, Ankit Chirag, MD       docusate sodium (COLACE) capsule 100 mg  100 mg Oral BID Amin, Ankit  Chirag, MD   100 mg at 04/19/22 0759   guaiFENesin (ROBITUSSIN) 100 MG/5ML liquid 5 mL  5 mL Oral Q4H PRN Amin, Ankit Chirag, MD   5 mL at 04/19/22 0800   hydrALAZINE (APRESOLINE) injection 10 mg  10 mg Intravenous Q4H PRN Amin, Ankit Chirag, MD       lactulose ([place]) 10 GM/15ML solution 20 g  20 g Oral BID Amin, Ankit Chirag, MD   20 g at 04/19/22 0800   metoprolol tartrate (LOPRESSOR) injection 5 mg  5 mg Intravenous Q4H PRN Amin, Ankit Chirag, MD       midodrine (PROAMATINE) tablet 10 mg  10 mg Oral TID WC Rise Patience, MD   10 mg at 04/19/22 0758   ondansetron (ZOFRAN) injection 4 mg  4 mg Intravenous Q6H PRN Amin, Ankit Chirag, MD       pantoprazole (PROTONIX) EC tablet 40 mg  40 mg Oral Daily Amin, Ankit Chirag, MD   40 mg at 04/19/22 0800   rifaximin (XIFAXAN) tablet 550 mg  550 mg Oral BID Amin, Ankit Chirag, MD   550 mg at 04/19/22 0800   senna-docusate (Senokot-S) tablet 1 tablet  1 tablet Oral QHS PRN Amin, Ankit Chirag, MD       traZODone (DESYREL) tablet 50 mg  50 mg Oral QHS PRN Damita Lack, MD        Allergies as of 04/18/2022   (No Known Allergies)    Social History   Socioeconomic History   Marital status: Single    Spouse name: Not on file   Number of children: Not on file   Years of education: Not on file   Highest education level: Not on file  Occupational History   Not on file  Tobacco Use   Smoking status: Former    Packs/day: 0.50    Years: 25.00    Total pack years: 12.50    Types: Cigarettes    Quit  date: 04/25/2019    Years since quitting: 2.9   Smokeless tobacco: Never  Vaping Use   Vaping Use: Never used  Substance and Sexual Activity   Alcohol use: Not Currently    Alcohol/week: 2.0 standard drinks of alcohol    Types: 2 Standard drinks or equivalent per week    Comment: not drank in approx. 1 year   Drug use: Never   Sexual activity: Not on file  Other Topics Concern   Not on file  Social History Narrative   Not on file    Social Determinants of Health   Financial Resource Strain: Not on file  Food Insecurity: Food Insecurity Present (04/18/2022)   Hunger Vital Sign    Worried About Running Out of Food in the Last Year: Sometimes true    Ran Out of Food in the Last Year: Sometimes true  Transportation Needs: No Transportation Needs (04/18/2022)   PRAPARE - Hydrologist (Medical): No    Lack of Transportation (Non-Medical): No  Physical Activity: Not on file  Stress: Not on file  Social Connections: Not on file  Intimate Partner Violence: Not At Risk (04/18/2022)   Humiliation, Afraid, Rape, and Kick questionnaire    Fear of Current or Ex-Partner: No    Emotionally Abused: No    Physically Abused: No    Sexually Abused: No    Review of Systems: All systems reviewed and negative except where noted in HPI.  Physical Exam: Vital signs in last 24 hours: Temp:  [97.6 F (36.4 C)-98.3 F (36.8 C)] 98.3 F (36.8 C) (02/23 0658) Pulse Rate:  [80-93] 90 (02/23 0658) Resp:  [13-22] 17 (02/23 0658) BP: (103-132)/(45-90) 103/45 (02/23 0658) SpO2:  [93 %-97 %] 96 % (02/23 0658) Weight:  [100.4 kg-102 kg] 100.4 kg (02/23 0500) Last BM Date : 04/15/22  General:  Alert male in NAD Psych:  Pleasant, cooperative. Normal mood and affect Eyes: Pupils equal Ears:  Normal auditory acuity Nose: No deformity, discharge or lesions Neck:  Supple, no masses felt Lungs:  Clear to auscultation.  Heart:  Regular rate, regular rhythm.  Abdomen:  Soft, moderate- largely distended, nontender, active bowel sounds, no masses felt Rectal :  Deferred Msk: Symmetrical without gross deformities.  Neurologic:  Alert, oriented, grossly normal neurologically, mild asterixis Extremities : BLE edema Skin:  Intact without significant lesions.    Intake/Output from previous day: 02/22 0701 - 02/23 0700 In: 415.2 [P.O.:300; I.V.:0.3; IV Piggyback:114.8] Out: 500 [Urine:500] Intake/Output this  shift:  No intake/output data recorded.    Principal Problem:   ARF (acute renal failure) (HCC) Active Problems:   Sarcoidosis of lung (HCC)   Elevated LFTs   Generalized weakness   COVID-19 virus infection   Alcoholic cirrhosis of liver with ascites ([place])   Anemia   Hyponatremia    Trevor Savoy, NP-C @  04/19/2022, 10:02 AM

## 2022-04-19 NOTE — Progress Notes (Signed)
  Transition of Care [facility]) Screening Note   Patient Details  Name: Trevor Reilly Date of Birth: 28-Dec-1970   Transition of Care [facility]) CM/SW Contact:    Dessa Phi, RN Phone Number: 04/19/2022, 10:51 AM    Transition of Care Department [facility]) has reviewed patient and no TOC needs have been identified at this time. We will continue to monitor patient advancement through interdisciplinary progression rounds. If new patient transition needs arise, please place a TOC consult.

## 2022-04-19 NOTE — H&P (Signed)
History and Physical    Trevor Reilly [id number] DOB: [birth year] DOA: 04/18/2022  Patient coming from: Home.  Chief Complaint: Right-sided chest pain and shortness of breath.  HPI: Trevor Reilly is a 52 y.o. male with history of alcoholic liver cirrhosis being followed by gastroenterologist at [facility] for liver transplant who has not had any alcohol for almost 10 months now presents to the ER because of increasing right-sided chest pain with shortness of breath and cough ongoing for last 1 week.  Patient had come to the ER earlier on 03/31/2022 for fall at the time CT scan showed rib fractures.  Patient states since then he has been taking Motrin every day.  Denies any hemoptysis or blood in the stools.  But once he did have some hematuria about 2 weeks ago when he was prescribed antibiotics following which his hematuria stopped.  Last 1 week he has been experiencing subjective feeling of fever chills.  Also on 04/01/2022 patient had paracentesis.  ED Course: In the ER patient was afebrile.  COVID test came back positive.  Patient was not hypoxic.  Chest x-ray was shows improved bibasilar infiltrates.  Labs are significant for increased creatinine of 2.9 from 1.15 about 3 weeks ago.  Hemoglobin dropped by 2 g it is around 8.5 stool for occult blood is negative.  Platelets are 74.  Patient admitted for acute renal failure with COVID-19 infection.  UA does not show any protein or any casts.  Review of Systems: As per HPI, rest all negative.   Past Medical History:  Diagnosis Date   Allergy    SEASONAL   Chronic cough    05-22-2020 per pt morning productive with yellow sputum   Cirrhosis (HCC)    Depression    ED (erectile dysfunction)    Epididymal cyst    left   Fatty liver    05-22-2020  per pt has been referred to GI,  last ultrasound in epic 06-10-2016   GERD (gastroesophageal reflux disease)    History of community acquired pneumonia 04/13/2020   ED visit in epic   History  of rectal bleeding    Hypertension    05-22-2020 followed by new pcp--- no medication currently   Mild obstructive sleep apnea    05-22-2020  per pt had study approx. 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   05-19-2020  currently started seeing new pcp,  previous seen by pulmonolgy , Elijio Miles PA([facility]) lov note 05-20-2016 [facility]   Sarcoidosis of skin    05-22-2020  per pt lower legs   Wears glasses     Past Surgical History:  Procedure Laterality Date   ARM SURGERY Left    "TRANSPLANT"-AGE 46   CYSTOSCOPY  05/24/2020   Procedure: CYSTOSCOPY FLEXIBLE;  Surgeon: Lucas Mallow, MD;  Location: [facility];  Service: Urology;;   EPIDIDYMECTOMY Left 05/24/2020   Procedure: LEFT EPIDIDYMAL CYST REMOVAL;  Surgeon: Lucas Mallow, MD;  Location: [facility];  Service: Urology;  Laterality: Left;   IR PARACENTESIS  09/14/2021   IR PARACENTESIS  04/01/2022   IR TRANSCATHETER BX  10/03/2021   IR US GUIDE VASC ACCESS RIGHT  10/03/2021   IR VENOGRAM HEPATIC W HEMODYNAMIC EVALUATION  10/03/2021   LUNG BIOPSY  2007   IR  CT guided needle lung biopsy's and washing's      reports that he quit smoking about 2 years ago. His smoking use included cigarettes. He has a 12.50 pack-year smoking history. He has never used smokeless tobacco. He reports that he does not currently use alcohol after a past usage of about 2.0 standard drinks of alcohol per week. He reports that he does not use drugs.  No Known Allergies  Family History  Problem Relation Age of Onset   Gout Mother    Hypertension Mother    Thyroid disease Mother    Stroke Father    Heart attack Father    Thyroid disease Sister    Hypertension Brother    Lung cancer Brother    Colon cancer Maternal Grandmother 32   Sarcoidosis Cousin    Crohn's disease Neg Hx     Esophageal cancer Neg Hx    Rectal cancer Neg Hx    Stomach cancer Neg Hx     Prior to Admission medications   Medication Sig Start Date End Date Taking? Authorizing Provider  albuterol (VENTOLIN HFA) 108 (90 Base) MCG/ACT inhaler Inhale 2 puffs into the lungs every 6 (six) hours as needed for shortness of breath. 07/18/15   [provider]  alum & mag hydroxide-simeth (MAALOX MAX) 400-400-40 MG/5ML suspension Take 10 mLs by mouth every 6 (six) hours as needed for indigestion. 06/15/21   Long, [name], MD  cyclobenzaprine (FLEXERIL) 5 MG tablet Take 5 mg by mouth 3 (three) times daily as needed for muscle spasms.    [provider]  fluticasone (FLONASE) 50 MCG/ACT nasal spray Place 1 spray into both nostrils 2 (two) times daily. 01/09/22   Hunsucker, Bonna Gains, MD  furosemide (LASIX) 20 MG tablet Take 1 tablet (20 mg total) by mouth 2 (two) times daily. 02/08/22   Curatolo, Adam, DO  Multiple Vitamins-Minerals (MULTIVITAMIN ADULTS 50+ PO) Take 1 tablet by mouth daily.    [provider]  omeprazole (PRILOSEC) 20 MG capsule Take 20 mg by mouth daily as needed (Heartburn).    [provider]  ondansetron (ZOFRAN-ODT) 4 MG disintegrating tablet Take 1 tablet (4 mg total) by mouth every 8 (eight) hours as needed for nausea or vomiting. 09/24/21   Isla Pence, MD  oxyCODONE (ROXICODONE) 5 MG immediate release tablet Take 1 tablet (5 mg total) by mouth every 6 (six) hours as needed for severe pain. 03/31/22   Azucena Cecil, PA-C  pantoprazole (PROTONIX) 40 MG tablet Take 1 tablet (40 mg total) by mouth daily. 07/16/21   Jackquline Denmark, MD  potassium chloride SA (KLOR-CON M) 20 MEQ tablet Take 1 tablet (20 mEq total) by mouth daily. 07/16/21   Jackquline Denmark, MD  silver sulfADIAZINE (SILVADENE) 1 % cream Apply 1 Application topically daily.    [provider]  spironolactone (ALDACTONE) 50 MG tablet Take 1 tablet (50 mg total) by mouth daily. 02/08/22    Curatolo, Adam, DO  triamcinolone cream (KENALOG) 0.1 % Apply 1 Application topically daily as needed (Hives). 08/23/21   [provider]  Vitamin D, Ergocalciferol, (DRISDOL) 1.25 MG (50000 UNIT) CAPS capsule Take 50,000 Units by mouth once a week.    [provider]    Physical Exam: Constitutional: Moderately built and nourished. Vitals:   04/18/22 2000 04/18/22 2130 04/18/22 2235 04/19/22 0211  BP: 104/66 (!) 103/59 114/84 (!) 111/55  Pulse: 85 80 89 92  Resp: '18 16 19 17  '$ Temp:   97.7 F (  36.5 C) 97.8 F (36.6 C)  TempSrc:   Oral Oral  SpO2: 93% 95% 95% 94%  Weight:      Height:       Eyes: Anicteric no pallor. ENMT: No discharge from the ears eyes nose and mouth. Neck: No mass felt.  No neck rigidity. Respiratory: No rhonchi or crepitations. Cardiovascular: S1-S2 heard. Abdomen: Distal nontender bowel sound present. Musculoskeletal: Bilateral lower extremity edema present. Skin: No rash.   Neurologic: Alert awake oriented time place and person.  Moves all extremities. Psychiatric: Appears normal.  Normal affect.   Labs on Admission: I have personally reviewed following labs and imaging studies  CBC: Recent Labs  Lab 04/18/22 1643  WBC 9.6  HGB 8.5*  HCT 24.6*  MCV 99.2  PLT 74*   Basic Metabolic Panel: Recent Labs  Lab 04/18/22 1643  NA 123*  K 4.8  CL 93*  CO2 23  GLUCOSE 111*  BUN 65*  CREATININE 2.92*  CALCIUM 7.6*   GFR: Estimated Creatinine Clearance: 35.6 mL/min (A) (by C-G formula based on SCr of 2.92 mg/dL (H)). Liver Function Tests: Recent Labs  Lab 04/18/22 1643  AST 169*  ALT 42  ALKPHOS 122  BILITOT 6.1*  PROT 5.5*  ALBUMIN <1.5*   No results for input(s): "LIPASE", "AMYLASE" in the last 168 hours. No results for input(s): "AMMONIA" in the last 168 hours. Coagulation Profile: No results for input(s): "INR", "PROTIME" in the last 168 hours. Cardiac Enzymes: No results for input(s): "CKTOTAL", "CKMB",  "CKMBINDEX", "TROPONINI" in the last 168 hours. BNP (last 3 results) No results for input(s): "PROBNP" in the last 8760 hours. HbA1C: No results for input(s): "HGBA1C" in the last 72 hours. CBG: No results for input(s): "GLUCAP" in the last 168 hours. Lipid Profile: No results for input(s): "CHOL", "HDL", "LDLCALC", "TRIG", "CHOLHDL", "LDLDIRECT" in the last 72 hours. Thyroid Function Tests: No results for input(s): "TSH", "T4TOTAL", "FREET4", "T3FREE", "THYROIDAB" in the last 72 hours. Anemia Panel: No results for input(s): "VITAMINB12", "FOLATE", "FERRITIN", "TIBC", "IRON", "RETICCTPCT" in the last 72 hours. Urine analysis:    Component Value Date/Time   COLORURINE YELLOW 04/18/2022 2004   APPEARANCEUR CLEAR 04/18/2022 2004   LABSPEC 1.015 04/18/2022 2004   PHURINE 5.5 04/18/2022 2004   GLUCOSEU NEGATIVE 04/18/2022 2004   HGBUR NEGATIVE 04/18/2022 2004   BILIRUBINUR SMALL (A) 04/18/2022 2004   KETONESUR NEGATIVE 04/18/2022 2004   PROTEINUR NEGATIVE 04/18/2022 2004   UROBILINOGEN 1.0 01/09/2011 1625   NITRITE NEGATIVE 04/18/2022 2004   LEUKOCYTESUR NEGATIVE 04/18/2022 2004   Sepsis Labs: '@LABRCNTIP'$ (procalcitonin:4,lacticidven:4) ) Recent Results (from the past 240 hour(s))  Resp panel by RT-PCR (RSV, Flu A&B, Covid) Anterior Nasal Swab     Status: Abnormal   Collection Time: 04/18/22  4:43 PM   Specimen: Anterior Nasal Swab  Result Value Ref Range Status   SARS Coronavirus 2 by RT PCR POSITIVE (A) NEGATIVE Final    Comment: (NOTE) SARS-CoV-2 target nucleic acids are DETECTED.  The SARS-CoV-2 RNA is generally detectable in upper respiratory specimens during the acute phase of infection. Positive results are indicative of the presence of the identified virus, but do not rule out bacterial infection or co-infection with other pathogens not detected by the test. Clinical correlation with patient history and other diagnostic information is necessary to determine  patient infection status. The expected result is Negative.  Fact Sheet for Patients: EntrepreneurPulse.com.au  Fact Sheet for Healthcare Providers: IncredibleEmployment.be  This test is not yet approved or cleared by the  [place] Architectural technologist and  has been authorized for detection and/or diagnosis of SARS-CoV-2 by FDA under an Print production planner (EUA).  This EUA will remain in effect (meaning this test can be used) for the duration of  the COVID-19 declaration under Section 564(b)(1) of the A ct, 21 U.S.C. section 360bbb-3(b)(1), unless the authorization is terminated or revoked sooner.     Influenza A by PCR NEGATIVE NEGATIVE Final   Influenza B by PCR NEGATIVE NEGATIVE Final    Comment: (NOTE) The Xpert Xpress SARS-CoV-2/FLU/RSV plus assay is intended as an aid in the diagnosis of influenza from Nasopharyngeal swab specimens and should not be used as a sole basis for treatment. Nasal washings and aspirates are unacceptable for Xpert Xpress SARS-CoV-2/FLU/RSV testing.  Fact Sheet for Patients: EntrepreneurPulse.com.au  Fact Sheet for Healthcare Providers: IncredibleEmployment.be  This test is not yet approved or cleared by the [place] FDA and has been authorized for detection and/or diagnosis of SARS-CoV-2 by FDA under an Emergency Use Authorization (EUA). This EUA will remain in effect (meaning this test can be used) for the duration of the COVID-19 declaration under Section 564(b)(1) of the Act, 21 U.S.C. section 360bbb-3(b)(1), unless the authorization is terminated or revoked.     Resp Syncytial Virus by PCR NEGATIVE NEGATIVE Final    Comment: (NOTE) Fact Sheet for Patients: EntrepreneurPulse.com.au  Fact Sheet for Healthcare Providers: IncredibleEmployment.be  This test is not yet approved or cleared by the [place] FDA and has been  authorized for detection and/or diagnosis of SARS-CoV-2 by FDA under an Emergency Use Authorization (EUA). This EUA will remain in effect (meaning this test can be used) for the duration of the COVID-19 declaration under Section 564(b)(1) of the Act, 21 U.S.C. section 360bbb-3(b)(1), unless the authorization is terminated or revoked.  Performed at [facility], [address] [place], [place] [address]      Radiological Exams on Admission: DG Chest 2 View  Result Date: 04/18/2022 CLINICAL DATA:  Shortness of breath, crackles, and cough for 3-4 days, RIGHT-sided chest and rib pain worse with deep breathing EXAM: CHEST - 2 VIEW COMPARISON:  03/31/2022 FINDINGS: Normal heart size, mediastinal contours, and pulmonary vascularity. Improved bibasilar atelectasis. Persistent peribronchial thickening and accentuation of perihilar markings question bronchitis. Mild residual accentuation of LEFT upper lobe markings. No definite new consolidation, pleural effusion, or pneumothorax. Chronic compression deformity of a mid to lower thoracic vertebral body unchanged. IMPRESSION: Improved bibasilar infiltrates. Residual bronchitic changes and accentuation of LEFT upper lobe markings. Electronically Signed   By: Lavonia Dana M.D.   On: 04/18/2022 14:40    EKG: Independently reviewed.  Normal sinus rhythm.  Assessment/Plan Principal Problem:   ARF (acute renal failure) (HCC) Active Problems:   Sarcoidosis of lung (HCC)   Elevated LFTs   Generalized weakness   COVID-19 virus infection   Alcoholic cirrhosis of liver with ascites (HCC)   Anemia   Hyponatremia    Acute renal failure -     given that patient was taking Motrin likely cause could be NSAIDs induced renal failure.  However since patient does have very low albumin and also has cirrhosis of liver we will keep patient on IV albumin and midodrine for now.  Follow metabolic panel closely.  Checking CT abdomen to make sure there is no  obstruction.  Holding Lasix and spironolactone for now.  Check urine sodium.  If creatinine does not improve will need nephrology input. COVID-19 infection -    presently  not hypoxic.  Check inflammatory markers.  Patient does have some pain in the right chest.  Getting CT chest without contrast.  If D-dimer is positive we will get VQ scan.  Follow inflammatory markers.  Not on any antivirals at this time since patient is not hypoxic and has been symptoms for more than a week. Worsening anemia follow CBC. Decompensated liver cirrhosis with ascites has had paracentesis on 04/01/2022.  Likely will need another paracentesis.  Keeping patient on empiric antibiotics for now for SBP coverage.  Will consult [facility].  Will check PT/INR to calculate MELD score. Thrombocytopenia likely from cirrhosis.  Follow CBC. Hyponatremia likely from fluid overload.  Restrict fluids.  Checking serial metabolic panel check urine studies. History of sarcoidosis of the lung being followed by pulmonologist.  Follow CT chest.   Since patient has acute renal failure with COVID-19 infection with worsening anemia will need more than 2 midnight stay and inpatient status.   DVT prophylaxis: SCDs. Code Status: Full code. Family Communication: Discussed with patient. Disposition Plan: Admit to [facility]. Consults called: Copywriter, advertising. Admission status: Inpatient.

## 2022-04-19 NOTE — Progress Notes (Signed)
PROGRESS NOTE    Trevor Reilly  [id number] DOB: Jun 30, 1970 DOA: 04/18/2022 PCP: Delford Field, FNP   Brief Narrative:  52 year old with history of alcoholic cirrhosis followed by GI at [facility] for liver transplant, alcohol abstinence for 10 months coming in with right-sided chest pain, shortness of breath and cough for 1 week.  CT in the ER on 2/4 had shown rib fractures and has been taking Motrin.  He also developed hematuria about 2 weeks ago when he was prescribed antibiotics and now has subsided.  Patient is also had paracentesis on 2/5.  Upon admission he was positive for COVID-19, chest x-ray showed bibasilar infiltrates which have improved, increasing creatinine to 2.9 from 1.1, hemoglobin 8.5 and thrombocytopenia.   Assessment & Plan:  Principal Problem:   ARF (acute renal failure) (HCC) Active Problems:   Sarcoidosis of lung (HCC)   Elevated LFTs   Generalized weakness   COVID-19 virus infection   Alcoholic cirrhosis of liver with ascites (HCC)   Anemia   Hyponatremia   Acute kidney injury - Baseline creatinine 1.1, admission creatinine 2.9.  This morning is 2.29.  Started on IV albumin and midodrine.  Holding Lasix and Aldactone. - Has low urine sodium.  Will give gentle hydration.  Hopefully albumin will avoid any overt third spacing  COVID-19 infection - No evidence of hypoxia.  Acute on chronic anemia - Baseline hemoglobin 10.9, upon admission 8.5.  No obvious evidence of bleeding but will continue to monitor  Decompensated alcoholic liver cirrhosis with ascites Thrombocytopenia, hyponatremia, hypoalbuminemia, coagulopathy Hepatic encephalopathy, portal hypertensive gastropathy - 6 has abstained from alcohol for about 10 months followed by GI at [facility].  Patient also had paracentesis on 2/5 without evidence of SBP, 3.5 L was removed.  [facility] has been consulted.  Admitting provider started patient on empiric Rocephin, for now we will  discontinue it - INR 3.0 -Outpatient med rec reviewed.  Added lactulose and rifaximin.  Our med rec needs to be updated, notified pharmacy tech.  EGD 06/2021-small hiatal hernia, portal hypertensive gastropathy Colonoscopy 06/2021-multiple polyps resected  Atypical chest pain Left-sided rib fracture - D-dimer is pending.  Troponin 69, continue to trend.  EKG nonischemic.  If troponins continues to rise, will obtain echocardiogram and possibly get cardiology consultation.  Sarcoidosis of the lungs - As needed bronchodilators.  I-S/flutter valve.     DVT prophylaxis: SCDs Start: 04/19/22 0252 Code Status: Full Code Family Communication:    Status is: Inpatient On Going decompensated cirrhosis eval, GI consulted. Also has AKI and Anemia.    Subjective:  Seen and examined at bedside, tells me he feels a lot better.  Chest pain is also improving.  Denies any shortness of breath.  Does feel his abdomen is slightly distended but denies any nausea, vomiting.  Examination:  General exam: Appears calm and comfortable  Respiratory system: Clear to auscultation. Respiratory effort normal. Cardiovascular system: S1 & S2 heard, RRR. No JVD, murmurs, rubs, gallops or clicks. No pedal edema. Gastrointestinal system: Abdomen is nondistended, soft and nontender. No organomegaly or masses felt. Normal bowel sounds heard. Central nervous system: Alert and oriented. No focal neurological deficits. Extremities: Symmetric 5 x 5 power. Skin: No rashes, lesions or ulcers Psychiatry: Judgement and insight appear normal. Mood & affect appropriate.     Objective: Vitals:   04/18/22 2235 04/19/22 0211 04/19/22 0500 04/19/22 0658  BP: 114/84 (!) 111/55  (!) 103/45  Pulse: 89 92  90  Resp: 19 17  17  Temp: 97.7 F (36.5 C) 97.8 F (36.6 C)  98.3 F (36.8 C)  TempSrc: Oral Oral  Oral  SpO2: 95% 94%  96%  Weight:   100.4 kg   Height:        Intake/Output Summary (Last 24 hours) at 04/19/2022  [id number] Last data filed at 04/19/2022 0501 Gross per 24 hour  Intake 415.15 ml  Output 500 ml  Net -84.85 ml   Filed Weights   04/18/22 1419 04/19/22 0500  Weight: 102 kg 100.4 kg     Data Reviewed:   CBC: Recent Labs  Lab 04/18/22 1643 04/19/22 0445  WBC 9.6 8.9  NEUTROABS  --  4.7  HGB 8.5* 8.4*  HCT 24.6* 24.9*  MCV 99.2 102.5*  PLT 74* 74*   Basic Metabolic Panel: Recent Labs  Lab 04/18/22 1643 04/19/22 0445  NA 123* 126*  K 4.8 4.7  CL 93* 96*  CO2 23 24  GLUCOSE 111* 89  BUN 65* 62*  CREATININE 2.92* 2.29*  CALCIUM 7.6* 7.7*   GFR: Estimated Creatinine Clearance: 45.1 mL/min (A) (by C-G formula based on SCr of 2.29 mg/dL (H)). Liver Function Tests: Recent Labs  Lab 04/18/22 1643  AST 169*  ALT 42  ALKPHOS 122  BILITOT 6.1*  PROT 5.5*  ALBUMIN <1.5*   No results for input(s): "LIPASE", "AMYLASE" in the last 168 hours. No results for input(s): "AMMONIA" in the last 168 hours. Coagulation Profile: Recent Labs  Lab 04/19/22 0637  INR 3.0*   Cardiac Enzymes: No results for input(s): "CKTOTAL", "CKMB", "CKMBINDEX", "TROPONINI" in the last 168 hours. BNP (last 3 results) No results for input(s): "PROBNP" in the last 8760 hours. HbA1C: No results for input(s): "HGBA1C" in the last 72 hours. CBG: No results for input(s): "GLUCAP" in the last 168 hours. Lipid Profile: No results for input(s): "CHOL", "HDL", "LDLCALC", "TRIG", "CHOLHDL", "LDLDIRECT" in the last 72 hours. Thyroid Function Tests: No results for input(s): "TSH", "T4TOTAL", "FREET4", "T3FREE", "THYROIDAB" in the last 72 hours. Anemia Panel: No results for input(s): "VITAMINB12", "FOLATE", "FERRITIN", "TIBC", "IRON", "RETICCTPCT" in the last 72 hours. Sepsis Labs: No results for input(s): "PROCALCITON", "LATICACIDVEN" in the last 168 hours.  Recent Results (from the past 240 hour(s))  Resp panel by RT-PCR (RSV, Flu A&B, Covid) Anterior Nasal Swab     Status: Abnormal   Collection  Time: 04/18/22  4:43 PM   Specimen: Anterior Nasal Swab  Result Value Ref Range Status   SARS Coronavirus 2 by RT PCR POSITIVE (A) NEGATIVE Final    Comment: (NOTE) SARS-CoV-2 target nucleic acids are DETECTED.  The SARS-CoV-2 RNA is generally detectable in upper respiratory specimens during the acute phase of infection. Positive results are indicative of the presence of the identified virus, but do not rule out bacterial infection or co-infection with other pathogens not detected by the test. Clinical correlation with patient history and other diagnostic information is necessary to determine patient infection status. The expected result is Negative.  Fact Sheet for Patients: EntrepreneurPulse.com.au  Fact Sheet for Healthcare Providers: IncredibleEmployment.be  This test is not yet approved or cleared by the [place] FDA and  has been authorized for detection and/or diagnosis of SARS-CoV-2 by FDA under an Emergency Use Authorization (EUA).  This EUA will remain in effect (meaning this test can be used) for the duration of  the COVID-19 declaration under Section 564(b)(1) of the A ct, 21 U.S.C. section 360bbb-3(b)(1), unless the authorization is terminated or revoked sooner.  Influenza A by PCR NEGATIVE NEGATIVE Final   Influenza B by PCR NEGATIVE NEGATIVE Final    Comment: (NOTE) The Xpert Xpress SARS-CoV-2/FLU/RSV plus assay is intended as an aid in the diagnosis of influenza from Nasopharyngeal swab specimens and should not be used as a sole basis for treatment. Nasal washings and aspirates are unacceptable for Xpert Xpress SARS-CoV-2/FLU/RSV testing.  Fact Sheet for Patients: EntrepreneurPulse.com.au  Fact Sheet for Healthcare Providers: IncredibleEmployment.be  This test is not yet approved or cleared by the [place] FDA and has been authorized for detection and/or diagnosis of  SARS-CoV-2 by FDA under an Emergency Use Authorization (EUA). This EUA will remain in effect (meaning this test can be used) for the duration of the COVID-19 declaration under Section 564(b)(1) of the Act, 21 U.S.C. section 360bbb-3(b)(1), unless the authorization is terminated or revoked.     Resp Syncytial Virus by PCR NEGATIVE NEGATIVE Final    Comment: (NOTE) Fact Sheet for Patients: EntrepreneurPulse.com.au  Fact Sheet for Healthcare Providers: IncredibleEmployment.be  This test is not yet approved or cleared by the [place] FDA and has been authorized for detection and/or diagnosis of SARS-CoV-2 by FDA under an Emergency Use Authorization (EUA). This EUA will remain in effect (meaning this test can be used) for the duration of the COVID-19 declaration under Section 564(b)(1) of the Act, 21 U.S.C. section 360bbb-3(b)(1), unless the authorization is terminated or revoked.  Performed at [facility], [address] [place], [place] [address]          Radiology Studies: DG Chest 2 View  Result Date: 04/18/2022 CLINICAL DATA:  Shortness of breath, crackles, and cough for 3-4 days, RIGHT-sided chest and rib pain worse with deep breathing EXAM: CHEST - 2 VIEW COMPARISON:  03/31/2022 FINDINGS: Normal heart size, mediastinal contours, and pulmonary vascularity. Improved bibasilar atelectasis. Persistent peribronchial thickening and accentuation of perihilar markings question bronchitis. Mild residual accentuation of LEFT upper lobe markings. No definite new consolidation, pleural effusion, or pneumothorax. Chronic compression deformity of a mid to lower thoracic vertebral body unchanged. IMPRESSION: Improved bibasilar infiltrates. Residual bronchitic changes and accentuation of LEFT upper lobe markings. Electronically Signed   By: Lavonia Dana M.D.   On: 04/18/2022 14:40        Scheduled Meds:  midodrine  10 mg Oral TID WC    Continuous Infusions:  sodium chloride 10 mL/hr at 04/19/22 0324   albumin human 100 g (04/19/22 0443)   cefTRIAXone (ROCEPHIN)  IV 2 g (04/19/22 0326)     LOS: 1 day   Time spent= 35 mins    Sigrid Schwebach Arsenio Loader, MD [facility]  If 7PM-7AM, please contact night-coverage  04/19/2022, 7:12 AM

## 2022-04-20 DIAGNOSIS — D696 Thrombocytopenia, unspecified: Secondary | ICD-10-CM | POA: Insufficient documentation

## 2022-04-20 DIAGNOSIS — E8809 Other disorders of plasma-protein metabolism, not elsewhere classified: Secondary | ICD-10-CM | POA: Insufficient documentation

## 2022-04-20 DIAGNOSIS — K7031 Alcoholic cirrhosis of liver with ascites: Secondary | ICD-10-CM | POA: Diagnosis not present

## 2022-04-20 DIAGNOSIS — N179 Acute kidney failure, unspecified: Secondary | ICD-10-CM | POA: Diagnosis not present

## 2022-04-20 DIAGNOSIS — U071 COVID-19: Secondary | ICD-10-CM | POA: Diagnosis not present

## 2022-04-20 DIAGNOSIS — S2232XA Fracture of one rib, left side, initial encounter for closed fracture: Secondary | ICD-10-CM | POA: Insufficient documentation

## 2022-04-20 DIAGNOSIS — K7682 Hepatic encephalopathy: Secondary | ICD-10-CM | POA: Diagnosis not present

## 2022-04-20 DIAGNOSIS — K7460 Unspecified cirrhosis of liver: Secondary | ICD-10-CM | POA: Diagnosis not present

## 2022-04-20 LAB — COMPREHENSIVE METABOLIC PANEL
ALT: 29 U/L (ref 0–44)
AST: 106 U/L — ABNORMAL HIGH (ref 15–41)
Albumin: 2 g/dL — ABNORMAL LOW (ref 3.5–5.0)
Alkaline Phosphatase: 87 U/L (ref 38–126)
Anion gap: 12 (ref 5–15)
BUN: 57 mg/dL — ABNORMAL HIGH (ref 6–20)
CO2: 22 mmol/L (ref 22–32)
Calcium: 8.2 mg/dL — ABNORMAL LOW (ref 8.9–10.3)
Chloride: 97 mmol/L — ABNORMAL LOW (ref 98–111)
Creatinine, Ser: 1.9 mg/dL — ABNORMAL HIGH (ref 0.61–1.24)
GFR, Estimated: 42 mL/min — ABNORMAL LOW (ref >60)
Glucose, Bld: 74 mg/dL (ref 70–99)
Potassium: 4.8 mmol/L (ref 3.5–5.1)
Sodium: 131 mmol/L — ABNORMAL LOW (ref 135–145)
Total Bilirubin: 6.4 mg/dL — ABNORMAL HIGH (ref 0.3–1.2)
Total Protein: 4.8 g/dL — ABNORMAL LOW (ref 6.5–8.1)

## 2022-04-20 LAB — CBC
HCT: 21.5 % — ABNORMAL LOW (ref 39.0–52.0)
Hemoglobin: 7.1 g/dL — ABNORMAL LOW (ref 13.0–17.0)
MCH: 34.3 pg — ABNORMAL HIGH (ref 26.0–34.0)
MCHC: 33 g/dL (ref 30.0–36.0)
MCV: 103.9 fL — ABNORMAL HIGH (ref 80.0–100.0)
Platelets: 71 10*3/uL — ABNORMAL LOW (ref 150–400)
RBC: 2.07 MIL/uL — ABNORMAL LOW (ref 4.22–5.81)
RDW: 18.6 % — ABNORMAL HIGH (ref 11.5–15.5)
WBC: 8.9 10*3/uL (ref 4.0–10.5)
nRBC: 0.7 % — ABNORMAL HIGH (ref 0.0–0.2)

## 2022-04-20 LAB — D-DIMER, QUANTITATIVE: D-Dimer, Quant: 9.37 ug/mL-FEU — ABNORMAL HIGH (ref 0.00–0.50)

## 2022-04-20 LAB — C-REACTIVE PROTEIN: CRP: 2.1 mg/dL — ABNORMAL HIGH (ref <1.0)

## 2022-04-20 MED ORDER — ALBUMIN HUMAN 25 % IV SOLN
25.0000 g | Freq: Four times a day (QID) | INTRAVENOUS | Status: AC
  Administered 2022-04-20 – 2022-04-21 (×4): 25 g via INTRAVENOUS
  Filled 2022-04-20 (×4): qty 100

## 2022-04-20 NOTE — Assessment & Plan Note (Signed)
Asymptomatic, mild infiltrates on chest imaging but no hypoxia. - Continue supportive cares

## 2022-04-20 NOTE — Assessment & Plan Note (Signed)
Possible hepatorenal syndrome vs dehydration/prerenal. Cr 2.9 on admission.  Resolved with albumin, midodrine, and blood transfusion, CT rule out obstruction, urinalysis bland.

## 2022-04-20 NOTE — Assessment & Plan Note (Signed)
Hgb trending down - Transfusion threshold 7 g/dL

## 2022-04-20 NOTE — Progress Notes (Addendum)
[facility] ROUNDING NOTE   Subjective: Feeling slightly better, abdomen less distended s/p fluid removal yesterday.  Ascites fluid negative for SBP   Objective: Vital signs in last 24 hours: Temp:  [98.1 F (36.7 C)-99.2 F (37.3 C)] 99.2 F (37.3 C) (02/24 0630) Pulse Rate:  [83-85] 85 (02/24 0630) Resp:  [19-20] 20 (02/24 0630) BP: (99-107)/(62-69) 107/62 (02/24 0630) SpO2:  [91 %-95 %] 95 % (02/24 0630) Weight:  [102.9 kg] 102.9 kg (02/24 0630) Last BM Date : 04/19/22 General: NAD, alert oriented x 3, no asterixis Abdomen: Distended, soft, nontender Bilateral edema   Intake/Output from previous day: 02/23 0701 - 02/24 0700 In: 2236 [P.O.:680; I.V.:1556] Out: 1575 [Urine:1575] Intake/Output this shift: Total I/O In: 340 [P.O.:340] Out: 150 [Urine:150]   Lab Results: Recent Labs    04/18/22 1643 04/19/22 0445 04/20/22 0756  WBC 9.6 8.9 8.9  HGB 8.5* 8.4* 7.1*  PLT 74* 74* 71*  MCV 99.2 102.5* 103.9*   BMET Recent Labs    04/19/22 1029 04/19/22 1453 04/20/22 0756  NA 127* 126* 131*  K 4.7 5.0 4.8  CL 94* 96* 97*  CO2 '23 23 22  '$ GLUCOSE 78 76 74  BUN 59* 63* 57*  CREATININE 2.26* 2.24* 1.90*  CALCIUM 8.2* 8.1* 8.2*   LFT Recent Labs    04/18/22 1643 04/20/22 0756  PROT 5.5* 4.8*  ALBUMIN <1.5* 2.0*  AST 169* 106*  ALT 42 29  ALKPHOS 122 87  BILITOT 6.1* 6.4*   PT/INR Recent Labs    04/19/22 0637  INR 3.0*      Imaging/Other results: CT RENAL STONE STUDY  Result Date: 04/19/2022 CLINICAL DATA:  Flank pain, unspecified abdominal pain EXAM: CT ABDOMEN AND PELVIS WITHOUT CONTRAST TECHNIQUE: Multidetector CT imaging of the abdomen and pelvis was performed following the standard protocol without IV contrast. RADIATION DOSE REDUCTION: This exam was performed according to the departmental dose-optimization program which includes automated exposure control, adjustment of the mA and/or kV according to patient size and/or use of  iterative reconstruction technique. COMPARISON:  03/19/2022 FINDINGS: Lower chest: There is perihilar architectural distortion, traction bronchiectasis, and ill-defined peribronchovascular ground-glass pulmonary nodules, stable since prior examination in keeping with changes of for treated sarcoidosis. Small left pleural effusion. Cardiac size within normal limits. Hypoattenuation of the cardiac blood pool is keeping with at least moderate anemia. Moderate hiatal hernia. Hepatobiliary: Cirrhotic hepatic morphology. Overall liver size is small. No definite focal intrahepatic mass identified on this noncontrast examination. No intra or extrahepatic biliary ductal dilation. The gallbladder is mildly distended but is otherwise unremarkable. Pancreas: Unremarkable. No pancreatic ductal dilatation or surrounding inflammatory changes. Spleen: Normal in size without focal abnormality. Adrenals/Urinary Tract: Adrenal glands are unremarkable. Kidneys are normal, without renal calculi, focal lesion, or hydronephrosis. Bladder is unremarkable. Stomach/Bowel: Large volume ascites appears stable since prior examination. Stomach, small bowel, and large bowel are otherwise unremarkable. Appendix normal. No free intraperitoneal gas. Vascular/Lymphatic: Mild aortoiliac atherosclerotic calcification. No aortic aneurysm. Multiple varicoid densities seen within the mesentery and pre caval retroperitoneum are in keeping with multiple portosystemic collaterals better seen on prior CT examination. No pathologic adenopathy within the abdomen and pelvis. Reproductive: Prostate is unremarkable. Other: Small fluid containing umbilical hernia. Extensive, diffuse body wall subcutaneous edema, similar to prior examination. Musculoskeletal: No acute bone abnormality. No lytic or blastic bone lesion. IMPRESSION: 1. No acute intra-abdominal pathology identified. No definite radiographic explanation for the patient's reported symptoms. 2. Morphologic  changes in keeping with cirrhosis and portal venous  hypertension with large volume ascites and multiple portosystemic collaterals. 3. Stable changes of treated sarcoidosis within the visualized lung bases. 4. Moderate hiatal hernia. 5. Stable changes of anasarca with diffuse body wall edema. Aortic Atherosclerosis (ICD10-I70.0). Electronically Signed   By: Fidela Salisbury M.D.   On: 04/19/2022 17:41   DG Chest 2 View  Result Date: 04/18/2022 CLINICAL DATA:  Shortness of breath, crackles, and cough for 3-4 days, RIGHT-sided chest and rib pain worse with deep breathing EXAM: CHEST - 2 VIEW COMPARISON:  03/31/2022 FINDINGS: Normal heart size, mediastinal contours, and pulmonary vascularity. Improved bibasilar atelectasis. Persistent peribronchial thickening and accentuation of perihilar markings question bronchitis. Mild residual accentuation of LEFT upper lobe markings. No definite new consolidation, pleural effusion, or pneumothorax. Chronic compression deformity of a mid to lower thoracic vertebral body unchanged. IMPRESSION: Improved bibasilar infiltrates. Residual bronchitic changes and accentuation of LEFT upper lobe markings. Electronically Signed   By: Lavonia Dana M.D.   On: 04/18/2022 14:40      Assessment &Plan  52 year old very pleasant gentleman with pulmonary sarcoidosis, decompensated cirrhosis with ascites and hepatic encephalopathy admitted with worsening AKI He is currently undergoing liver transplant evaluation  MELD 3.0: 35 at 04/20/2022  7:56 AM MELD-Na: 34 at 04/20/2022  7:56 AM   Poor p.o. intake with COVID infection  Urine sodium less than 10 concerning for possible HRS Avoid NSAIDs AKI improving Continue midodrine and IV albumin Diuretics on hold Nephrology consult  Ascites negative for SBP  Worsening anemia: No evidence of active GI bleed, , fecal Hemoccult negative during this admission Possible secondary to chronic intermittent occult GI blood loss He has known  history of gastric sarcoidosis and portal hypertensive gastritis Pantoprazole 40 mg twice daily Continue supportive care Monitor hemoglobin and transfuse if below 7  Reviewed recent EGD and colonoscopy within the past year, will defer repeat endoscopic evaluation unless needed for therapeutic intervention in the setting of active GI hemorrhage  Continue diet as tolerated  GI is available if needed, please call with any questions  The patient was provided an opportunity to ask questions and all were answered. The patient agreed with the plan and demonstrated an understanding of the instructions.   Damaris Hippo , MD [phone]  [facility]

## 2022-04-20 NOTE — Assessment & Plan Note (Signed)
Asymptomatic. 

## 2022-04-20 NOTE — Assessment & Plan Note (Signed)
Platelets stable

## 2022-04-20 NOTE — Progress Notes (Signed)
  Progress Note   Patient: Trevor Reilly [id number] DOB: April 02, 1970 DOA: 04/18/2022     2 DOS: the patient was seen and examined on 04/20/2022 at 1450      Brief hospital course: 52 year old with history of alcoholic cirrhosis followed by GI at [facility] for liver transplant, alcohol abstinence for 10 months coming in with right-sided chest pain, shortness of breath and cough for 1 week. CT in the ER on 2/4 had shown rib fractures and has been taking Motrin. He also developed hematuria about 2 weeks ago when he was prescribed antibiotics and now has subsided. Patient is also had paracentesis on 2/5. Upon admission he was positive for COVID-19, chest x-ray showed bibasilar infiltrates which have improved, increasing creatinine to 2.9 from 1.1, hemoglobin 8.5 and thrombocytopenia.      Assessment and Plan: * ARF (acute renal failure) (HCC) Cr slightly better to 1.9 - Continue albumin, midodrine - Trend Cr  COVID-19 virus infection Asymptomatic  Alcoholic cirrhosis of liver with ascites (HCC) - Consult GI, appreciate cares - Continue lactulose, Xifaxan - Continue midodrine - Continue Rocephin  Hypoalbuminemia    Thrombocytopenia (HCC) Platelets stable at 71K  Hyponatremia Na better with albumin  Anemia Hgb trending down - Transfusion threshold 7 g/dL  Sarcoidosis of lung (HCC) Asymptomatic          Subjective: Feeling better.  Still jaundiced.  Had 3BM in last 24 hours.       Physical Exam: BP 106/60   Pulse 82   Temp 98 F (36.7 C) (Oral)   Resp 16   Ht 5' 9"$  (1.753 m)   Wt 102.9 kg   SpO2 94%   BMI 33.50 kg/m   Adult male, lying in bed, appears jaundiced RRR, no murmurs, no peripheral edema Respiratory rate normal, lungs clear without rales or wheezes Abdomen soft no tenderness palpation or guarding, no ascites or distention    Data Reviewed: Creatinine down to 1.9, white blood cell count improved Sodium up to 131 Hemoglobin down to  7.1    Family Communication: None present    Disposition: Status is: Inpatient         Author: Edwin Dada, MD 04/20/2022 7:44 PM  For on call review [email or web address].

## 2022-04-20 NOTE — Hospital Course (Signed)
Mr. Aceto is a 52 y.o. 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     2/23: In the ER, noted to have AKI and COVID+; diagnostic para ruled out SBP; GI consulted, FOBT negative 2/25: Hgb down to 6.3g/dL, transfused 1 unit; Cr improving with albumin, midodrine 2/26: Repeat paracentesis planned

## 2022-04-20 NOTE — Assessment & Plan Note (Signed)
SBP ruled out on admission. - Continue lactulose and Xifaxan - Continue midodrine - Resume home Lasix/spironolactone  - Consult GI, appreciate cares - SBP ppx per GI

## 2022-04-20 NOTE — Assessment & Plan Note (Signed)
Na stable

## 2022-04-21 DIAGNOSIS — K767 Hepatorenal syndrome: Secondary | ICD-10-CM

## 2022-04-21 DIAGNOSIS — E669 Obesity, unspecified: Secondary | ICD-10-CM | POA: Insufficient documentation

## 2022-04-21 DIAGNOSIS — D638 Anemia in other chronic diseases classified elsewhere: Secondary | ICD-10-CM

## 2022-04-21 DIAGNOSIS — R188 Other ascites: Secondary | ICD-10-CM

## 2022-04-21 DIAGNOSIS — K7460 Unspecified cirrhosis of liver: Secondary | ICD-10-CM

## 2022-04-21 DIAGNOSIS — K3189 Other diseases of stomach and duodenum: Secondary | ICD-10-CM | POA: Insufficient documentation

## 2022-04-21 DIAGNOSIS — N179 Acute kidney failure, unspecified: Secondary | ICD-10-CM | POA: Diagnosis not present

## 2022-04-21 LAB — COMPREHENSIVE METABOLIC PANEL
ALT: 23 U/L (ref 0–44)
AST: 88 U/L — ABNORMAL HIGH (ref 15–41)
Albumin: 2.3 g/dL — ABNORMAL LOW (ref 3.5–5.0)
Alkaline Phosphatase: 78 U/L (ref 38–126)
Anion gap: 7 (ref 5–15)
BUN: 52 mg/dL — ABNORMAL HIGH (ref 6–20)
CO2: 22 mmol/L (ref 22–32)
Calcium: 8.1 mg/dL — ABNORMAL LOW (ref 8.9–10.3)
Chloride: 99 mmol/L (ref 98–111)
Creatinine, Ser: 1.6 mg/dL — ABNORMAL HIGH (ref 0.61–1.24)
GFR, Estimated: 52 mL/min — ABNORMAL LOW (ref >60)
Glucose, Bld: 99 mg/dL (ref 70–99)
Potassium: 4.3 mmol/L (ref 3.5–5.1)
Sodium: 128 mmol/L — ABNORMAL LOW (ref 135–145)
Total Bilirubin: 6.4 mg/dL — ABNORMAL HIGH (ref 0.3–1.2)
Total Protein: 5 g/dL — ABNORMAL LOW (ref 6.5–8.1)

## 2022-04-21 LAB — CBC
HCT: 18.8 % — ABNORMAL LOW (ref 39.0–52.0)
Hemoglobin: 6.3 g/dL — CL (ref 13.0–17.0)
MCH: 35 pg — ABNORMAL HIGH (ref 26.0–34.0)
MCHC: 33.5 g/dL (ref 30.0–36.0)
MCV: 104.4 fL — ABNORMAL HIGH (ref 80.0–100.0)
Platelets: 60 10*3/uL — ABNORMAL LOW (ref 150–400)
RBC: 1.8 MIL/uL — ABNORMAL LOW (ref 4.22–5.81)
RDW: 19.6 % — ABNORMAL HIGH (ref 11.5–15.5)
WBC: 10.4 10*3/uL (ref 4.0–10.5)
nRBC: 0.5 % — ABNORMAL HIGH (ref 0.0–0.2)

## 2022-04-21 LAB — HEMOGLOBIN AND HEMATOCRIT, BLOOD
HCT: 21.7 % — ABNORMAL LOW (ref 39.0–52.0)
Hemoglobin: 7.3 g/dL — ABNORMAL LOW (ref 13.0–17.0)

## 2022-04-21 LAB — C-REACTIVE PROTEIN: CRP: 1.7 mg/dL — ABNORMAL HIGH (ref <1.0)

## 2022-04-21 LAB — ABO/RH: ABO/RH(D): A POS

## 2022-04-21 LAB — PREPARE RBC (CROSSMATCH)

## 2022-04-21 LAB — D-DIMER, QUANTITATIVE: D-Dimer, Quant: 8.91 ug/mL-FEU — ABNORMAL HIGH (ref 0.00–0.50)

## 2022-04-21 MED ORDER — SODIUM CHLORIDE 0.9% IV SOLUTION: Freq: Once | INTRAVENOUS | Status: AC

## 2022-04-21 MED ORDER — ORAL CARE MOUTH RINSE: 15.0000 mL | OROMUCOSAL | Status: DC | PRN

## 2022-04-21 NOTE — Assessment & Plan Note (Signed)
BMI 32

## 2022-04-21 NOTE — Assessment & Plan Note (Signed)
1 week PTA fell, seen in ER, noted to have rib fractures, discharged home. Currently pain well controlled - Avoid NSAIDs

## 2022-04-21 NOTE — Progress Notes (Signed)
  Progress Note   Patient: Trevor Reilly [id number] DOB: 1970-11-06 DOA: 04/18/2022     3 DOS: the patient was seen and examined on 04/21/2022 at 8:54AM      Brief hospital course: Trevor Reilly is a 52 y.o. 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     2/23: In the ER, noted to have AKI and COVID+; diagnostic para ruled out SBP; GI consulted, FOBT negative 2/25: Hgb down to 6.3g/dL, transfused 1 unit; Cr improving with albumin, midodrine        Assessment and Plan: * ARF (acute renal failure) (HCC) Cr 2.9 on admission.  Improving with albumin and midodrine and PRBCs to 1.6 today.  CT ruled out obstruction.  UA bland. - Continue albumin, midodrine - Trend Cr  COVID-19 virus infection Asymptomatic, mild infiltrates on chest imaging but no hypoxia. - Continue supportive cares  Alcoholic cirrhosis of liver with ascites (HCC) Ruled out SBP and GI doubt GIB, so will stop antibiotics.  - Consult GI, appreciate cares - Continue lactulose, Xifaxan - Continue midodrine - Stop Rocephin  Portal hypertensive gastropathy (HCC) - Continue PPI  Left rib fracture 1 week PTA fell, seen in ER, noted to have rib fractures, discharged home. Currently pain well controlled - Avoid NSAIDs  Hypoalbuminemia Due to cirrhosis  Thrombocytopenia (HCC) Platelets stable  Hyponatremia Na stable  Anemia Hgb trending down to 6.3 g/dL overnight.  Likely combination of occult GI blood loss and hemodilution.  - Continue PPI and GI defer EGD for now - Transfuse 1 unit   Sarcoidosis of lung (HCC) Asymptomatic          Subjective: Patient had a dark bowel movement yesterday, no bowel movements overnight, no other possible clinical bleeding.  No fever, no  vomiting.  No respiratory changes.     Physical Exam: BP (!) 103/54   Pulse 91   Temp 98.2 F (36.8 C) (Axillary)   Resp 18   Ht 5' 9"$  (1.753 m)   Wt 100.4 kg   SpO2 94%   BMI 32.69 kg/m   Adult male, sitting in bed, appears tired, scleral icterus RRR, no murmurs, mild peripheral edema Respiratory rate normal, lungs clear without rales or wheezes Abdomen somewhat distended with ascites, no tenderness to palpation Attention normal, affect normal, no asterixis, no psychomotor slowing, oriented to person, place, and time    Data Reviewed: Discussed with gastroenterology Hemoglobin down to 6.3, platelets 60, essentially no change Creatinine from 1.9 down to 1.6 today D-dimer elevated, consistent with COVID, do not suspect clot Sodium 128, stable from yesterday   Family Communication: None present    Disposition: Status is: Inpatient         Author: Edwin Dada, MD 04/21/2022 11:48 AM  For on call review [email or web address].

## 2022-04-21 NOTE — Assessment & Plan Note (Signed)
Continue PPI ?

## 2022-04-21 NOTE — Progress Notes (Signed)
[facility] ROUNDING NOTE   Subjective: He is feeling better, no specific GI complaints   Objective: Vital signs in last 24 hours: Temp:  [97.8 F (36.6 C)-98.2 F (36.8 C)] 98.2 F (36.8 C) (02/25 0945) Pulse Rate:  [79-93] 91 (02/25 0945) Resp:  [16-18] 18 (02/25 0945) BP: (97-106)/(52-60) 103/54 (02/25 0945) SpO2:  [92 %-94 %] 94 % (02/25 0945) Weight:  [100.4 kg] 100.4 kg (02/25 0500) Last BM Date : 04/20/22 General: NAD   Intake/Output from previous day: 02/24 0701 - 02/25 0700 In: 1250.1 [P.O.:1035; IV Piggyback:215.1] Out: 1650 [Urine:1650] Intake/Output this shift: Total I/O In: 360 [P.O.:360] Out: 250 [Urine:250]   Lab Results: Recent Labs    04/19/22 0445 04/20/22 0756 04/21/22 0509  WBC 8.9 8.9 10.4  HGB 8.4* 7.1* 6.3*  PLT 74* 71* 60*  MCV 102.5* 103.9* 104.4*   BMET Recent Labs    04/19/22 1453 04/20/22 0756 04/21/22 0509  NA 126* 131* 128*  K 5.0 4.8 4.3  CL 96* 97* 99  CO2 '23 22 22  '$ GLUCOSE 76 74 99  BUN 63* 57* 52*  CREATININE 2.24* 1.90* 1.60*  CALCIUM 8.1* 8.2* 8.1*   LFT Recent Labs    04/18/22 1643 04/20/22 0756 04/21/22 0509  PROT 5.5* 4.8* 5.0*  ALBUMIN <1.5* 2.0* 2.3*  AST 169* 106* 88*  ALT 42 29 23  ALKPHOS 122 87 78  BILITOT 6.1* 6.4* 6.4*   PT/INR Recent Labs    04/19/22 0637  INR 3.0*      Imaging/Other results: CT RENAL STONE STUDY  Result Date: 04/19/2022 CLINICAL DATA:  Flank pain, unspecified abdominal pain EXAM: CT ABDOMEN AND PELVIS WITHOUT CONTRAST TECHNIQUE: Multidetector CT imaging of the abdomen and pelvis was performed following the standard protocol without IV contrast. RADIATION DOSE REDUCTION: This exam was performed according to the departmental dose-optimization program which includes automated exposure control, adjustment of the mA and/or kV according to patient size and/or use of iterative reconstruction technique. COMPARISON:  03/19/2022 FINDINGS: Lower chest: There is  perihilar architectural distortion, traction bronchiectasis, and ill-defined peribronchovascular ground-glass pulmonary nodules, stable since prior examination in keeping with changes of for treated sarcoidosis. Small left pleural effusion. Cardiac size within normal limits. Hypoattenuation of the cardiac blood pool is keeping with at least moderate anemia. Moderate hiatal hernia. Hepatobiliary: Cirrhotic hepatic morphology. Overall liver size is small. No definite focal intrahepatic mass identified on this noncontrast examination. No intra or extrahepatic biliary ductal dilation. The gallbladder is mildly distended but is otherwise unremarkable. Pancreas: Unremarkable. No pancreatic ductal dilatation or surrounding inflammatory changes. Spleen: Normal in size without focal abnormality. Adrenals/Urinary Tract: Adrenal glands are unremarkable. Kidneys are normal, without renal calculi, focal lesion, or hydronephrosis. Bladder is unremarkable. Stomach/Bowel: Large volume ascites appears stable since prior examination. Stomach, small bowel, and large bowel are otherwise unremarkable. Appendix normal. No free intraperitoneal gas. Vascular/Lymphatic: Mild aortoiliac atherosclerotic calcification. No aortic aneurysm. Multiple varicoid densities seen within the mesentery and pre caval retroperitoneum are in keeping with multiple portosystemic collaterals better seen on prior CT examination. No pathologic adenopathy within the abdomen and pelvis. Reproductive: Prostate is unremarkable. Other: Small fluid containing umbilical hernia. Extensive, diffuse body wall subcutaneous edema, similar to prior examination. Musculoskeletal: No acute bone abnormality. No lytic or blastic bone lesion. IMPRESSION: 1. No acute intra-abdominal pathology identified. No definite radiographic explanation for the patient's reported symptoms. 2. Morphologic changes in keeping with cirrhosis and portal venous hypertension with large volume ascites  and multiple portosystemic collaterals. 3. Stable  changes of treated sarcoidosis within the visualized lung bases. 4. Moderate hiatal hernia. 5. Stable changes of anasarca with diffuse body wall edema. Aortic Atherosclerosis (ICD10-I70.0). Electronically Signed   By: Fidela Salisbury M.D.   On: 04/19/2022 17:41      Assessment &Plan  52 year old very pleasant gentleman with pulmonary sarcoidosis, decompensated cirrhosis with ascites and hepatic encephalopathy admitted with worsening AKI He is currently undergoing liver transplant evaluation   MELD 3.0: 34 at 04/21/2022  5:09 AM MELD-Na: 33 at 04/21/2022  5:09 AM  COVID-19 infection Pulmonary sarcoidosis   AKI is improving on midodrine and IV albumin   Ascites negative for SBP On ceftriaxone for prophylaxis   Worsening anemia: No evidence of active GI bleed, , fecal Hemoccult negative during this admission Possible secondary to chronic intermittent occult GI blood loss He has known history of gastric sarcoidosis and portal hypertensive gastritis Pantoprazole 40 mg twice daily Continue supportive care Monitor hemoglobin and transfuse if below 7   Reviewed recent EGD and colonoscopy within the past year, will defer repeat endoscopic evaluation unless needed for therapeutic intervention in the setting of active GI hemorrhage   Continue diet as tolerated     Raliegh Ip Denzil Magnuson , MD [phone]  [facility]

## 2022-04-22 DIAGNOSIS — K7460 Unspecified cirrhosis of liver: Secondary | ICD-10-CM | POA: Diagnosis not present

## 2022-04-22 DIAGNOSIS — N179 Acute kidney failure, unspecified: Secondary | ICD-10-CM | POA: Diagnosis not present

## 2022-04-22 DIAGNOSIS — K7031 Alcoholic cirrhosis of liver with ascites: Secondary | ICD-10-CM | POA: Diagnosis not present

## 2022-04-22 DIAGNOSIS — K7682 Hepatic encephalopathy: Secondary | ICD-10-CM

## 2022-04-22 DIAGNOSIS — U071 COVID-19: Secondary | ICD-10-CM | POA: Diagnosis not present

## 2022-04-22 LAB — COMPREHENSIVE METABOLIC PANEL
ALT: 22 U/L (ref 0–44)
AST: 75 U/L — ABNORMAL HIGH (ref 15–41)
Albumin: 2.4 g/dL — ABNORMAL LOW (ref 3.5–5.0)
Alkaline Phosphatase: 82 U/L (ref 38–126)
Anion gap: 6 (ref 5–15)
BUN: 40 mg/dL — ABNORMAL HIGH (ref 6–20)
CO2: 21 mmol/L — ABNORMAL LOW (ref 22–32)
Calcium: 7.9 mg/dL — ABNORMAL LOW (ref 8.9–10.3)
Chloride: 100 mmol/L (ref 98–111)
Creatinine, Ser: 1.23 mg/dL (ref 0.61–1.24)
GFR, Estimated: >60 mL/min (ref >60)
Glucose, Bld: 92 mg/dL (ref 70–99)
Potassium: 3.9 mmol/L (ref 3.5–5.1)
Sodium: 127 mmol/L — ABNORMAL LOW (ref 135–145)
Total Bilirubin: 7 mg/dL — ABNORMAL HIGH (ref 0.3–1.2)
Total Protein: 5 g/dL — ABNORMAL LOW (ref 6.5–8.1)

## 2022-04-22 LAB — TYPE AND SCREEN
ABO/RH(D): A POS
Antibody Screen: NEGATIVE
Unit division: 0

## 2022-04-22 LAB — BPAM RBC
Blood Product Expiration Date: 202403152359
ISSUE DATE / TIME: 202402250920
Unit Type and Rh: 6200

## 2022-04-22 LAB — CBC
HCT: 21.9 % — ABNORMAL LOW (ref 39.0–52.0)
Hemoglobin: 7.2 g/dL — ABNORMAL LOW (ref 13.0–17.0)
MCH: 34.1 pg — ABNORMAL HIGH (ref 26.0–34.0)
MCHC: 32.9 g/dL (ref 30.0–36.0)
MCV: 103.8 fL — ABNORMAL HIGH (ref 80.0–100.0)
Platelets: 63 10*3/uL — ABNORMAL LOW (ref 150–400)
RBC: 2.11 MIL/uL — ABNORMAL LOW (ref 4.22–5.81)
RDW: 22.4 % — ABNORMAL HIGH (ref 11.5–15.5)
WBC: 10.9 10*3/uL — ABNORMAL HIGH (ref 4.0–10.5)
nRBC: 0.4 % — ABNORMAL HIGH (ref 0.0–0.2)

## 2022-04-22 LAB — MAGNESIUM: Magnesium: 1.8 mg/dL (ref 1.7–2.4)

## 2022-04-22 MED ORDER — OXYCODONE HCL 5 MG PO TABS
2.5000 mg | ORAL_TABLET | ORAL | Status: DC | PRN
  Administered 2022-04-22 – 2022-04-23 (×4): 2.5 mg via ORAL
  Filled 2022-04-22 (×4): qty 1

## 2022-04-22 MED ORDER — SPIRONOLACTONE 25 MG PO TABS
50.0000 mg | ORAL_TABLET | Freq: Every day | ORAL | Status: DC
  Administered 2022-04-22 – 2022-04-23 (×2): 50 mg via ORAL
  Filled 2022-04-22 (×2): qty 2

## 2022-04-22 MED ORDER — FUROSEMIDE 20 MG PO TABS
20.0000 mg | ORAL_TABLET | Freq: Two times a day (BID) | ORAL | Status: DC
  Administered 2022-04-22 – 2022-04-23 (×3): 20 mg via ORAL
  Filled 2022-04-22 (×3): qty 1

## 2022-04-22 MED ORDER — ALBUMIN HUMAN 25 % IV SOLN: 12.5000 g | Freq: Once | INTRAVENOUS | Status: DC | PRN

## 2022-04-22 NOTE — TOC Initial Note (Signed)
Transition of Care [facility]) - Initial/Assessment Note    Patient Details  Name: Trevor Reilly MRN: [id number] Date of Birth: 1970/03/27  Transition of Care (TOC) CM/SW Contact:    Joaquin Courts, RN Phone Number: 04/22/2022, 3:40 PM  Clinical Narrative:                      Transition of Care Department [facility]) has reviewed patient and no TOC needs have been identified at this time. We will continue to monitor patient advancement through interdisciplinary progression rounds. If new patient transition needs arise, please place a TOC consult.    Expected Discharge Plan: Home/Self Care

## 2022-04-22 NOTE — Progress Notes (Signed)
  Progress Note   Patient: Trevor Reilly [id number] DOB: [birth year] DOA: 04/18/2022     4 DOS: the patient was seen and examined on 04/22/2022 at 1:30PM      Brief hospital course: Trevor Reilly is a 52 y.o. 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     2/23: In the ER, noted to have AKI and COVID+; diagnostic para ruled out SBP; GI consulted, FOBT negative 2/25: Hgb down to 6.3g/dL, transfused 1 unit; Cr improving with albumin, midodrine 2/26: Repeat paracentesis planned       Assessment and Plan: * ARF (acute renal failure) (HCC) Possible hepatorenal syndrome vs dehydration/prerenal. Cr 2.9 on admission.  Resolved with albumin, midodrine, and blood transfusion, CT rule out obstruction, urinalysis bland.     COVID-19 virus infection Asymptomatic, mild infiltrates on chest imaging but no hypoxia. - Continue supportive cares  Alcoholic cirrhosis of liver with ascites (HCC) SBP ruled out on admission. - Continue lactulose and Xifaxan - Continue midodrine - Resume home Lasix/spironolactone  - Consult GI, appreciate cares - SBP ppx per GI  Obesity (BMI 30-39.9) BMI 32  Portal hypertensive gastropathy (HCC) - Continue PPI  Left rib fracture 1 week PTA fell, seen in ER, noted to have rib fractures, discharged home. Currently pain well controlled - Avoid NSAIDs  Hypoalbuminemia Due to cirrhosis  Thrombocytopenia (HCC) Platelets stable  Hyponatremia Na stable  Anemia Hgb trending down to 6.3 g/dL 2/25, transfused 1 unit.  Likely combination of occult GI blood loss and hemodilution.   Hgb stable ovenright - Continue PPI   - Trend Hgb   Sarcoidosis of lung (HCC) Asymptomatic          Subjective: Patient has some pain in his  left side with certain movements, likely due to his rib fractures.  He had no fever, abdominal pain, confusion.     Physical Exam: BP 110/66 (BP Location: Left Arm)   Pulse 83   Temp 97.8 F (36.6 C) (Oral)   Resp 20   Ht 5' 9"$  (1.753 m)   Wt 101.8 kg   SpO2 95%   BMI 33.14 kg/m   Adult male, lying in bed, interactive and appropriate, appetite good, mildly jaundiced RRR, no murmurs, no peripheral edema Respiratory normal, lungs clear without rales or wheezes Abdomen slightly distended with ascites, significant tenderness to palpation of my exam Attention normal, psychomotor speed appears normal, oriented to person, place, time, face symmetric, speech fluent    Data Reviewed: Discussed with gastroenterology CBC shows platelets unchanged at 63, hemoglobin 7.2, stable from last night Total bilirubin 7, no change Creatinine down to 1.2, sodium 127, no change in basic metabolic panel    Family Communication: None present    Disposition: Status is: Inpatient         Author: Edwin Dada, MD 04/22/2022 2:20 PM  For on call review [email or web address].

## 2022-04-22 NOTE — Plan of Care (Signed)

## 2022-04-22 NOTE — Progress Notes (Addendum)
Progress Note   Subjective  Chief Complaint: Decompensated cirrhosis with ascites and hepatic encephalopathy admitted with worsening AKI  Today, the patient tells me he is feeling fairly well.  He is wondering when he is going to get to go home.  Tells me he is having 3 bowel movements a day with no evidence of GI bleed.  He does describe some lower abdominal pain but he thinks that was also related to having to go to the bathroom as it felt some better afterwards.  Does tell me though that his abdomen feels pretty distended and he wonders if he can have any further fluid drawn off.   Objective   Vital signs in last 24 hours: Temp:  [97.5 F (36.4 C)-98.5 F (36.9 C)] 97.8 F (36.6 C) (02/26 1153) Pulse Rate:  [81-87] 83 (02/26 1153) Resp:  [18-21] 20 (02/26 1153) BP: (104-110)/(56-66) 110/66 (02/26 1153) SpO2:  [89 %-97 %] 95 % (02/26 1153) Weight:  [101.8 kg] 101.8 kg (02/26 0500) Last BM Date : 04/21/22 General:    AA male in NAD Heart:  Regular rate and rhythm; no murmurs Lungs: Respirations even and unlabored, lungs CTA bilaterally Abdomen:  Soft, mild bilateral lower TTP and moderate distention. Normal bowel sounds. Psych:  Cooperative. Normal mood and affect.  Intake/Output from previous day: 02/25 0701 - 02/26 0700 In: 1707.2 [P.O.:960; I.V.:130.9; Blood:356; IV Piggyback:260.2] Out: [id number] [Urine:1525] Intake/Output this shift: Total I/O In: 240 [P.O.:240] Out: 500 [Urine:500]  Lab Results: Recent Labs    04/20/22 0756 04/21/22 0509 04/21/22 1418 04/22/22 0419  WBC 8.9 10.4  --  10.9*  HGB 7.1* 6.3* 7.3* 7.2*  HCT 21.5* 18.8* 21.7* 21.9*  PLT 71* 60*  --  63*   BMET Recent Labs    04/20/22 0756 04/21/22 0509 04/22/22 0419  NA 131* 128* 127*  K 4.8 4.3 3.9  CL 97* 99 100  CO2 22 22 21*  GLUCOSE 74 99 92  BUN 57* 52* 40*  CREATININE 1.90* 1.60* 1.23  CALCIUM 8.2* 8.1* 7.9*   LFT Recent Labs    04/22/22 0419  PROT 5.0*  ALBUMIN 2.4*  AST  75*  ALT 22  ALKPHOS 82  BILITOT 7.0*    Assessment / Plan:   Assessment: 1.  Decompensated cirrhosis with ascites and hepatic encephalopathy: MELD 3.0 34 on 2/25, MELD-Na 30 3 AM 2/25, ascites negative for SBP, on Ceftriaxone for prophylaxis 2.  AKI: Improving over the past 48 hours 3.  Anemia: No evidence of active GI bleed, fecal Hemoccult negative during this admission, thought possibly secondary to chronic intermittent occult GI blood loss, has known history of gastric sarcoidosis and portal hypertensive gastritis 4.  Covid-19: per primary service  Plan: 1.  Continue Pantoprazole 40 twice daily 2.  Continue to monitor hemoglobin and transfusion as needed less than 7 3.  Again no need for repeat EGD and colonoscopy now given these were recently done within the past year.  Only need did if therapeutic intervention needed in the setting of active GI hemorrhage which is not present at this time. 4.  Continue diet as tolerated 5.  Will order therapeutic ultrasound-guided paracentesis with albumin replacement today  Thank you for your kind consultation, we will continue to follow along.   LOS: 4 days   Levin Erp  04/22/2022, 12:21 PM    Attending Physician Note   I have taken an interval history, reviewed the chart and examined the patient. I performed a substantive  portion of this encounter, including complete performance of at least one of the key components, in conjunction with the APP. I agree with the APP's note, impression and recommendations with my edits. My additional impressions and recommendations are as follows. OK to discontinue ceftriaxone as discussed with Dr. Loleta Books.  Lucio Edward, MD [facility], [facility], for our on call provider

## 2022-04-22 NOTE — Progress Notes (Signed)
   04/22/22 1540  Readmission Prevention Plan - High Risk  Home Care Consult (High Risk) Complete  High Risk Social Work Consult for recovery care planning/counseling (includes patient and caregiver) Complete  High Risk Palliative Care Screening Not Applicable  Medication Review Complete

## 2022-04-23 ENCOUNTER — Inpatient Hospital Stay (HOSPITAL_COMMUNITY): Payer: BC Managed Care – PPO

## 2022-04-23 LAB — COMPREHENSIVE METABOLIC PANEL
ALT: 20 U/L (ref 0–44)
AST: 74 U/L — ABNORMAL HIGH (ref 15–41)
Albumin: 1.7 g/dL — ABNORMAL LOW (ref 3.5–5.0)
Alkaline Phosphatase: 79 U/L (ref 38–126)
Anion gap: 4 — ABNORMAL LOW (ref 5–15)
BUN: 27 mg/dL — ABNORMAL HIGH (ref 6–20)
CO2: 22 mmol/L (ref 22–32)
Calcium: 7.7 mg/dL — ABNORMAL LOW (ref 8.9–10.3)
Chloride: 104 mmol/L (ref 98–111)
Creatinine, Ser: 1.13 mg/dL (ref 0.61–1.24)
GFR, Estimated: >60 mL/min (ref >60)
Glucose, Bld: 109 mg/dL — ABNORMAL HIGH (ref 70–99)
Potassium: 4.2 mmol/L (ref 3.5–5.1)
Sodium: 130 mmol/L — ABNORMAL LOW (ref 135–145)
Total Bilirubin: 7.6 mg/dL — ABNORMAL HIGH (ref 0.3–1.2)
Total Protein: 4.9 g/dL — ABNORMAL LOW (ref 6.5–8.1)

## 2022-04-23 LAB — DRUG PROFILE, UR, 9 DRUGS (LABCORP)
Amphetamines, Urine: NEGATIVE ng/mL
Barbiturate, Ur: NEGATIVE ng/mL
Benzodiazepine Quant, Ur: NEGATIVE ng/mL
Cannabinoid Quant, Ur: NEGATIVE ng/mL
Cocaine (Metab.): NEGATIVE ng/mL
Methadone Screen, Urine: NEGATIVE ng/mL
Opiate Quant, Ur: NEGATIVE ng/mL
Phencyclidine, Ur: NEGATIVE ng/mL
Propoxyphene, Urine: NEGATIVE ng/mL

## 2022-04-23 LAB — CBC
HCT: 22.1 % — ABNORMAL LOW (ref 39.0–52.0)
Hemoglobin: 7.2 g/dL — ABNORMAL LOW (ref 13.0–17.0)
MCH: 34.1 pg — ABNORMAL HIGH (ref 26.0–34.0)
MCHC: 32.6 g/dL (ref 30.0–36.0)
MCV: 104.7 fL — ABNORMAL HIGH (ref 80.0–100.0)
Platelets: 75 10*3/uL — ABNORMAL LOW (ref 150–400)
RBC: 2.11 MIL/uL — ABNORMAL LOW (ref 4.22–5.81)
RDW: 22.5 % — ABNORMAL HIGH (ref 11.5–15.5)
WBC: 10.9 10*3/uL — ABNORMAL HIGH (ref 4.0–10.5)
nRBC: 0.2 % (ref 0.0–0.2)

## 2022-04-23 MED ORDER — XIFAXAN 550 MG PO TABS: 550.0000 mg | ORAL_TABLET | Freq: Two times a day (BID) | ORAL | 3 refills | Status: AC

## 2022-04-23 MED ORDER — LIDOCAINE HCL 1 % IJ SOLN
INTRAMUSCULAR | Status: AC
  Filled 2022-04-23: qty 20

## 2022-04-23 MED ORDER — MIDODRINE HCL 5 MG PO TABS: 10.0000 mg | ORAL_TABLET | Freq: Two times a day (BID) | ORAL | 1 refills | Status: AC

## 2022-04-23 MED ORDER — ALBUMIN HUMAN 25 % IV SOLN
12.5000 g | Freq: Once | INTRAVENOUS | Status: AC
  Administered 2022-04-23: 12.5 g via INTRAVENOUS
  Filled 2022-04-23: qty 50

## 2022-04-23 MED ORDER — LACTULOSE 10 GM/15ML PO SOLN: 20.0000 g | Freq: Two times a day (BID) | ORAL | 0 refills | Status: DC

## 2022-04-23 MED ORDER — PANTOPRAZOLE SODIUM 40 MG PO TBEC: 40.0000 mg | DELAYED_RELEASE_TABLET | Freq: Two times a day (BID) | ORAL | 0 refills | Status: DC

## 2022-04-23 MED ORDER — LIDOCAINE HCL 1 % IJ SOLN
INTRAMUSCULAR | Status: AC
  Administered 2022-04-23: 10 mL
  Filled 2022-04-23: qty 20

## 2022-04-23 NOTE — Discharge Summary (Signed)
Physician Discharge Summary   Patient: Trevor Reilly MRN: [id number] DOB: 12/13/1970  Admit date:     04/18/2022  Discharge date: 04/23/22  Discharge Physician: Edwin Dada   PCP: Delford Field, FNP     Recommendations at discharge:  Follow up with Roosevelt Locks in 1 week Ms Drazek:  Please obtain CBC and BMP in 1 week Discontinue midodrine if appropriate     Discharge Diagnoses: Principal Problem:   ARF (acute renal failure) ([place]) Active Problems:   COVID-19 virus infection   Alcoholic cirrhosis of liver with ascites ([place])   Sarcoidosis of lung (HCC)   Anemia   Hyponatremia   Thrombocytopenia (HCC)   Hypoalbuminemia   Left rib fracture   Portal hypertensive gastropathy (HCC)   Obesity (BMI 30-39.9)      Hospital Course: Trevor Reilly is a 52 y.o. 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  In the ER, noted to have AKI and COVID+; diagnostic para ruled out SBP; GI consulted, FOBT negative     * ARF (acute renal failure) (HCC) Possible hepatorenal syndrome vs dehydration/prerenal. Cr 2.9 on admission.  Resolved with albumin, midodrine, and blood transfusion, CT rule out obstruction, urinalysis bland.    COVID-19 virus infection Asymptomatic, mild infiltrates on chest imaging but no hypoxia.  Treated with supportive cares and improved.   Anemia likely due to chronic blood loss anemia from portal gastropathy from cirrhosis Hgb trended down to 6.3 g/dL on 2/25, transfused 1 unit.  Stable afterwards.  GI consulted, FOBT negative, given recent endsocopy and likely low yield, this was not pursued.   Alcoholic cirrhosis of liver with ascites (HCC) SBP ruled out on admission.  Lactulose restarted, Xifaxan continued.  Home  diuretics resumed when Cr resolved and tolerated well.  Underwent paracentesis again on day of discharge, cell count ruled out SBP.    Has follow up with Transplant Hepatology.     Left rib fracture 1 week PTA fell, seen in ER, noted to have rib fractures, discharged home. Currently pain well controlled  Thrombocytopenia ([place]) Platelets stable at baseline  Sarcoidosis of lung ([place]) Asymptomatic            The [place] Registry was reviewed for this patient prior to discharge.   Consultants: [facility] Procedures performed: Paracentesis  Disposition: Home   DISCHARGE MEDICATION: Allergies as of 04/23/2022   No Known Allergies      Medication List     STOP taking these medications    cyclobenzaprine 5 MG tablet Commonly known as: FLEXERIL   omeprazole 20 MG capsule Commonly known as: PRILOSEC Replaced by: pantoprazole 40 MG tablet   oxyCODONE 5 MG immediate release tablet Commonly known as: Roxicodone   potassium chloride SA 20 MEQ tablet Commonly known as: KLOR-CON M       TAKE these medications    fluticasone 50 MCG/ACT nasal spray Commonly known as: FLONASE Place 1 spray into both nostrils 2 (two) times daily.   furosemide 20 MG tablet Commonly known as: LASIX Take 1 tablet (20 mg total) by mouth 2 (two) times daily. What changed: when to take this   lactulose 10 GM/15ML solution Commonly known as: CHRONULAC Take 30 mLs (20 g total) by mouth 2 (two) times daily.  midodrine 5 MG tablet Commonly known as: PROAMATINE Take 2 tablets (10 mg total) by mouth 2 (two) times daily with a meal.   pantoprazole 40 MG tablet Commonly known as: PROTONIX Take 1 tablet (40 mg total) by mouth 2 (two) times daily. Replaces: omeprazole 20 MG capsule   silver sulfADIAZINE 1 % cream Commonly known as: SILVADENE Apply 1 Application topically 2 (two) times a week.   spironolactone 50 MG tablet Commonly known  as: ALDACTONE Take 1 tablet (50 mg total) by mouth daily.   Xifaxan 550 MG Tabs tablet Generic drug: rifaximin Take 1 tablet (550 mg total) by mouth 2 (two) times daily.        Follow-up Information     Delford Field, FNP. Schedule an appointment as soon as possible for a visit in 1 week(s).   Specialty: Family Medicine Contact information: [address] [place] [address] [phone]         Roosevelt Locks, [place]. Call.   Specialty: Nurse Practitioner Why: to see when you should follow up again Contact information: [address] [place] [address] [phone]                 Discharge Instructions     Diet - low sodium heart healthy   Complete by: As directed    Discharge instructions   Complete by: As directed    **IMPORTANT DISCHARGE INSTRUCTIONS**   From Dr. Loleta Books: You were admitted for fever and belly swelling and found to have COVID and kidney injury  Here, we treated you for the kidney injury with albumin (fluids) and started the medicine midodrine to bring your blood pressure up. Take midodrine until you see Roosevelt Locks and then ask her if you should continue longer   Because your platelets are low, you may be losing blood from oozing from your stomach.  Take pantoprazole (Protonix) 40 mg twice daily This is a stomach acid reducing medicine, and will prevent oozing as best we can (This is like Prilosec/omeprazole, so take this instead of omeprazole)   Also, for the ammonia level, take lactulose and Xifaxan Ammonia is the toxin that builds up and makes you sleepy and confused  Lactulose and Xifaxan help you clear it in your bowel movements. If you have too many bowel movements in a day (four or more) then reduce the lactulose by one dose per day and discuss with Dawn  Resume your other home medicines  Follow up with your PCP and [facility] Locks  Have one of them check your lab work in 1 week   Increase activity  slowly   Complete by: As directed        Discharge Exam: Filed Weights   04/21/22 0500 04/22/22 0500 04/23/22 0619  Weight: 100.4 kg 101.8 kg 98.7 kg    General: Pt is alert, awake, not in acute distress Cardiovascular: RRR, nl S1-S2, no murmurs appreciated.   No LE edema.   Respiratory: Normal respiratory rate and rhythm.  CTAB without rales or wheezes. Abdominal: Abdomen soft and non-tender.  No distension or HSM.   Neuro/Psych: Strength symmetric in upper and lower extremities.  Judgment and insight appear normal.   Condition at discharge: good  The results of significant diagnostics from this hospitalization (including imaging, microbiology, ancillary and laboratory) are listed below for reference.   Imaging Studies: US Paracentesis  Result Date: 04/23/2022 INDICATION: Patient with history of decompensated cirrhosis with recurrent ascites. Request is for therapeutic and diagnostic paracentesis. EXAM:  ULTRASOUND GUIDED THERAPEUTIC AND DIAGNOSTIC PARACENTESIS MEDICATIONS: Lidocaine 1% 10 mL COMPLICATIONS: None immediate. PROCEDURE: Informed written consent was obtained from the patient after a discussion of the risks, benefits and alternatives to treatment. A timeout was performed prior to the initiation of the procedure. Initial ultrasound scanning demonstrates a large amount of ascites within the right lower abdominal quadrant. The right lower abdomen was prepped and draped in the usual sterile fashion. 1% lidocaine was used for local anesthesia. Following this, a 19 gauge, 7-cm, Yueh catheter was introduced. An ultrasound image was saved for documentation purposes. The paracentesis was performed. The catheter was removed and a dressing was applied. The patient tolerated the procedure well without immediate post procedural complication. Patient received post-procedure intravenous albumin; see nursing notes for details. FINDINGS: A total of approximately 6.5 L of straw-colored fluid was  removed. Samples were sent to the laboratory as requested by the clinical team. IMPRESSION: Successful ultrasound-guided therapeutic and diagnostic paracentesis yielding 6.5 L of peritoneal fluid. Read by: Rushie Nyhan, NP PLAN: The patient has required >/=2 paracenteses in a 30 day period and a formal evaluation by the [facility] has been arranged. Michaelle Birks, MD [facility] Electronically Signed   By: Michaelle Birks M.D.   On: 04/23/2022 17:30   US Paracentesis  Result Date: 04/22/2022 INDICATION: 52 year old with decompensated cirrhosis, recurrent ascites. Request for diagnostic paracentesis of 200 mL EXAM: ULTRASOUND GUIDED DIAGNOSTIC PARACENTESIS MEDICATIONS: 10 mL 1% lidocaine COMPLICATIONS: None immediate. PROCEDURE: Informed written consent was obtained from the patient after a discussion of the risks, benefits and alternatives to treatment. A timeout was performed prior to the initiation of the procedure. Initial ultrasound scanning demonstrates a moderate amount of ascites within the right lower abdominal quadrant. The right lower abdomen was prepped and draped in the usual sterile fashion. 1% lidocaine was used for local anesthesia. Following this, a 19 gauge, 7-cm, Yueh catheter was introduced. An ultrasound image was saved for documentation purposes. The paracentesis was performed. The catheter was removed and a dressing was applied. The patient tolerated the procedure well without immediate post procedural complication. FINDINGS: A total of approximately 240 mL of yellow, cloudy fluid was removed. Samples were sent to the laboratory as requested by the clinical team. IMPRESSION: Successful ultrasound-guided paracentesis yielding 240 mL liters of peritoneal fluid. Read by: Brynda Greathouse PA-C PLAN: If the patient eventually requires >/=2 paracenteses in a 30 day period, candidacy for  formal evaluation by the [facility] will be assessed. Electronically Signed   By: Aletta Edouard M.D.   On: 04/22/2022 07:45   CT RENAL STONE STUDY  Result Date: 04/19/2022 CLINICAL DATA:  Flank pain, unspecified abdominal pain EXAM: CT ABDOMEN AND PELVIS WITHOUT CONTRAST TECHNIQUE: Multidetector CT imaging of the abdomen and pelvis was performed following the standard protocol without IV contrast. RADIATION DOSE REDUCTION: This exam was performed according to the departmental dose-optimization program which includes automated exposure control, adjustment of the mA and/or kV according to patient size and/or use of iterative reconstruction technique. COMPARISON:  03/19/2022 FINDINGS: Lower chest: There is perihilar architectural distortion, traction bronchiectasis, and ill-defined peribronchovascular ground-glass pulmonary nodules, stable since prior examination in keeping with changes of for treated sarcoidosis. Small left pleural effusion. Cardiac size within normal limits. Hypoattenuation of the cardiac blood pool is keeping with at least moderate anemia. Moderate hiatal hernia. Hepatobiliary: Cirrhotic hepatic morphology. Overall liver size is small. No definite focal  intrahepatic mass identified on this noncontrast examination. No intra or extrahepatic biliary ductal dilation. The gallbladder is mildly distended but is otherwise unremarkable. Pancreas: Unremarkable. No pancreatic ductal dilatation or surrounding inflammatory changes. Spleen: Normal in size without focal abnormality. Adrenals/Urinary Tract: Adrenal glands are unremarkable. Kidneys are normal, without renal calculi, focal lesion, or hydronephrosis. Bladder is unremarkable. Stomach/Bowel: Large volume ascites appears stable since prior examination. Stomach, small bowel, and large bowel are otherwise unremarkable. Appendix normal. No free intraperitoneal gas. Vascular/Lymphatic: Mild  aortoiliac atherosclerotic calcification. No aortic aneurysm. Multiple varicoid densities seen within the mesentery and pre caval retroperitoneum are in keeping with multiple portosystemic collaterals better seen on prior CT examination. No pathologic adenopathy within the abdomen and pelvis. Reproductive: Prostate is unremarkable. Other: Small fluid containing umbilical hernia. Extensive, diffuse body wall subcutaneous edema, similar to prior examination. Musculoskeletal: No acute bone abnormality. No lytic or blastic bone lesion. IMPRESSION: 1. No acute intra-abdominal pathology identified. No definite radiographic explanation for the patient's reported symptoms. 2. Morphologic changes in keeping with cirrhosis and portal venous hypertension with large volume ascites and multiple portosystemic collaterals. 3. Stable changes of treated sarcoidosis within the visualized lung bases. 4. Moderate hiatal hernia. 5. Stable changes of anasarca with diffuse body wall edema. Aortic Atherosclerosis (ICD10-I70.0). Electronically Signed   By: Fidela Salisbury M.D.   On: 04/19/2022 17:41   DG Chest 2 View  Result Date: 04/18/2022 CLINICAL DATA:  Shortness of breath, crackles, and cough for 3-4 days, RIGHT-sided chest and rib pain worse with deep breathing EXAM: CHEST - 2 VIEW COMPARISON:  03/31/2022 FINDINGS: Normal heart size, mediastinal contours, and pulmonary vascularity. Improved bibasilar atelectasis. Persistent peribronchial thickening and accentuation of perihilar markings question bronchitis. Mild residual accentuation of LEFT upper lobe markings. No definite new consolidation, pleural effusion, or pneumothorax. Chronic compression deformity of a mid to lower thoracic vertebral body unchanged. IMPRESSION: Improved bibasilar infiltrates. Residual bronchitic changes and accentuation of LEFT upper lobe markings. Electronically Signed   By: Lavonia Dana M.D.   On: 04/18/2022 14:40   IR Paracentesis  Result Date:  04/01/2022 INDICATION: Patient history alcoholic cirrhosis with recurrent ascites. Request is for therapeutic and diagnostic paracentesis EXAM: ULTRASOUND GUIDED THERAPEUTIC AND DIAGNOSTIC PARACENTESIS MEDICATIONS: Lidocaine 1% 10 mL COMPLICATIONS: None immediate. PROCEDURE: Informed written consent was obtained from the patient after a discussion of the risks, benefits and alternatives to treatment. A timeout was performed prior to the initiation of the procedure. Initial ultrasound scanning demonstrates a moderate amount of ascites within the right upper abdominal quadrant. The right upper abdomen was prepped and draped in the usual sterile fashion. 1% lidocaine was used for local anesthesia. Following this, a 19 gauge, 7-cm, Yueh catheter was introduced. An ultrasound image was saved for documentation purposes. The paracentesis was performed. The catheter was removed and a dressing was applied. The patient tolerated the procedure well without immediate post procedural complication. FINDINGS: A total of approximately 3.5 L of pale yellow fluid was removed. Samples were sent to the laboratory as requested by the clinical team. IMPRESSION: Successful ultrasound-guided therapeutic and diagnostic paracentesis yielding 3.5 liters of peritoneal fluid. Read by: Rushie Nyhan, NP PLAN: If the patient eventually requires >/=2 paracenteses in a 30 day period, candidacy for formal evaluation by the [facility] will be assessed. Electronically Signed   By: Albin Felling M.D.   On: 04/01/2022 17:07   CT Chest Wo Contrast  Result Date: 03/31/2022 CLINICAL DATA:  Left chest wall pain after a  fall. EMR history includes sarcoidosis. EXAM: CT CHEST WITHOUT CONTRAST TECHNIQUE: Multidetector CT imaging of the chest was performed following the standard protocol without IV contrast. RADIATION DOSE REDUCTION: This exam was performed according to the departmental  dose-optimization program which includes automated exposure control, adjustment of the mA and/or kV according to patient size and/or use of iterative reconstruction technique. COMPARISON:  Plain film of earlier today. Chest CT 06/22/2016 from [facility]. FINDINGS: Cardiovascular: Aortic atherosclerosis. Normal heart size, without pericardial effusion. Mediastinum/Nodes: Bilateral axillary adenopathy as evidenced by increased number and less so size of axillary nodes. Example 1.4 cm right axillary node which maintains its fatty hilum. No middle mediastinal adenopathy. Hilar regions poorly evaluated without intravenous contrast. A moderate hiatal hernia. The distal esophagus and herniated stomach appear thick walled including on 89/2. No esophageal obstruction. Lungs/Pleura: Small left pleural effusion. No pneumothorax. No pulmonary contusion. No consolidation. Upper lung predominant, left-greater-than-right peribronchovascular interstitial thickening and mild nodularity are mildly progressive compared to 2018. Mild architectural distortion within the left upper lobe. Upper Abdomen: Marked cirrhosis. Normal imaged portions of the spleen. Moderate to large volume abdominal ascites. No gross upper abdominal air. Musculoskeletal: Marked bilateral gynecomastia. 4th and fifth anterior left rib nondisplaced to minimally displaced fractures including on 50/2 and 62/2. T9 mild compression deformity without ventral canal encroachment. New since 06/22/2016. No gross paravertebral hematoma. IMPRESSION: 1. 4th and fifth anterior left rib nondisplaced to minimally displaced acute/subacute fractures. Small left pleural effusion, without pneumothorax. 2. T9 compression deformity, new since 2018 and favored to be nonacute. Correlate with point tenderness. 3. Marked cirrhosis with upper abdominal ascites, suboptimally evaluated. 4. Hiatal hernia with apparent wall thickening of distal esophagus and herniated stomach.  Considerations include inflammation, neoplasm, or varices. Poorly evaluated on this noncontrast exam. Consider nonemergent endoscopy. 5. Pulmonary findings of sarcoidosis, minimally progressive. Bilateral axillary adenopathy, most likely reactive or inflammatory. 6. Gynecomastia 7.  Aortic Atherosclerosis (ICD10-I70.0). Electronically Signed   By: Abigail Miyamoto M.D.   On: 03/31/2022 17:47   DG Chest 2 View  Result Date: 03/31/2022 CLINICAL DATA:  Fall, injury EXAM: CHEST - 2 VIEW COMPARISON:  Chest radiograph dated March 19, 2018 FINDINGS: The heart size and mediastinal contours are within normal limits. Right basilar opacity suggesting atelectasis and/or small pleural effusion. The visualized skeletal structures are unremarkable. IMPRESSION: Right basilar opacity suggesting atelectasis and/or small pleural effusion. Underlying airspace disease can not be excluded. Electronically Signed   By: Keane Police D.O.   On: 03/31/2022 16:13    Microbiology: Results for orders placed or performed during the hospital encounter of 04/18/22  Resp panel by RT-PCR (RSV, Flu A&B, Covid) Anterior Nasal Swab     Status: Abnormal   Collection Time: 04/18/22  4:43 PM   Specimen: Anterior Nasal Swab  Result Value Ref Range Status   SARS Coronavirus 2 by RT PCR POSITIVE (A) NEGATIVE Final    Comment: (NOTE) SARS-CoV-2 target nucleic acids are DETECTED.  The SARS-CoV-2 RNA is generally detectable in upper respiratory specimens during the acute phase of infection. Positive results are indicative of the presence of the identified virus, but do not rule out bacterial infection or co-infection with other pathogens not detected by the test. Clinical correlation with patient history and other diagnostic information is necessary to determine patient infection status. The expected result is Negative.  Fact Sheet for Patients: EntrepreneurPulse.com.au  Fact Sheet for Healthcare  Providers: IncredibleEmployment.be  This test is not yet approved or cleared by the [place] FDA  and  has been authorized for detection and/or diagnosis of SARS-CoV-2 by FDA under an Emergency Use Authorization (EUA).  This EUA will remain in effect (meaning this test can be used) for the duration of  the COVID-19 declaration under Section 564(b)(1) of the A ct, 21 U.S.C. section 360bbb-3(b)(1), unless the authorization is terminated or revoked sooner.     Influenza A by PCR NEGATIVE NEGATIVE Final   Influenza B by PCR NEGATIVE NEGATIVE Final    Comment: (NOTE) The Xpert Xpress SARS-CoV-2/FLU/RSV plus assay is intended as an aid in the diagnosis of influenza from Nasopharyngeal swab specimens and should not be used as a sole basis for treatment. Nasal washings and aspirates are unacceptable for Xpert Xpress SARS-CoV-2/FLU/RSV testing.  Fact Sheet for Patients: EntrepreneurPulse.com.au  Fact Sheet for Healthcare Providers: IncredibleEmployment.be  This test is not yet approved or cleared by the [place] FDA and has been authorized for detection and/or diagnosis of SARS-CoV-2 by FDA under an Emergency Use Authorization (EUA). This EUA will remain in effect (meaning this test can be used) for the duration of the COVID-19 declaration under Section 564(b)(1) of the Act, 21 U.S.C. section 360bbb-3(b)(1), unless the authorization is terminated or revoked.     Resp Syncytial Virus by PCR NEGATIVE NEGATIVE Final    Comment: (NOTE) Fact Sheet for Patients: EntrepreneurPulse.com.au  Fact Sheet for Healthcare Providers: IncredibleEmployment.be  This test is not yet approved or cleared by the [place] FDA and has been authorized for detection and/or diagnosis of SARS-CoV-2 by FDA under an Emergency Use Authorization (EUA). This EUA will remain in effect (meaning this test can be  used) for the duration of the COVID-19 declaration under Section 564(b)(1) of the Act, 21 U.S.C. section 360bbb-3(b)(1), unless the authorization is terminated or revoked.  Performed at [facility], [address] [place], [place] [address]   Culture, body fluid w Gram Stain-bottle     Status: None (Preliminary result)   Collection Time: 04/19/22  5:19 PM   Specimen: Fluid  Result Value Ref Range Status   Specimen Description FLUID PERITONEAL  Final   Special Requests BOTTLES DRAWN AEROBIC AND ANAEROBIC  Final   Culture   Final    NO GROWTH 4 DAYS Performed at [facility], [address] [place], [place] [address]    Report Status PENDING  Incomplete  Gram stain     Status: None   Collection Time: 04/19/22  5:19 PM   Specimen: Fluid  Result Value Ref Range Status   Specimen Description FLUID PERITONEAL  Final   Special Requests NONE  Final   Gram Stain   Final    WBC PRESENT, PREDOMINANTLY PMN NO ORGANISMS SEEN CYTOSPIN SMEAR Performed at [facility], [place] [address] [place], [place] [address]    Report Status 04/19/2022 FINAL  Final    Labs: CBC: Recent Labs  Lab 04/19/22 0445 04/20/22 0756 04/21/22 0509 04/21/22 1418 04/22/22 0419 04/23/22 0526  WBC 8.9 8.9 10.4  --  10.9* 10.9*  NEUTROABS 4.7  --   --   --   --   --   HGB 8.4* 7.1* 6.3* 7.3* 7.2* 7.2*  HCT 24.9* 21.5* 18.8* 21.7* 21.9* 22.1*  MCV 102.5* 103.9* 104.4*  --  103.8* 104.7*  PLT 74* 71* 60*  --  63* 75*   Basic Metabolic Panel: Recent Labs  Lab 04/19/22 0637 04/19/22 1029 04/19/22 1453 04/20/22 0756 04/21/22 0509 04/22/22 0419 04/23/22 0526  NA  126*   < > 126* 131* 128* 127* 130*  K 4.7   < > 5.0 4.8 4.3 3.9 4.2  CL 95*   < > 96* 97* 99 100 104  CO2 22   < > '23 22 22 '$ 21* 22  GLUCOSE 78   < > 76 74 99 92 109*  BUN 63*   < > 63* 57* 52* 40* 27*  CREATININE 2.22*   < > 2.24* 1.90* 1.60* 1.23 1.13  CALCIUM 7.8*   < > 8.1* 8.2* 8.1* 7.9* 7.7*  MG 1.6*  --   --    --   --  1.8  --    < > = values in this interval not displayed.   Liver Function Tests: Recent Labs  Lab 04/18/22 1643 04/20/22 0756 04/21/22 0509 04/22/22 0419 04/23/22 0526  AST 169* 106* 88* 75* 74*  ALT 42 '29 23 22 20  '$ ALKPHOS 122 87 78 82 79  BILITOT 6.1* 6.4* 6.4* 7.0* 7.6*  PROT 5.5* 4.8* 5.0* 5.0* 4.9*  ALBUMIN <1.5* 2.0* 2.3* 2.4* 1.7*   CBG: No results for input(s): "GLUCAP" in the last 168 hours.  Discharge time spent: approximately 35 minutes spent on discharge counseling, evaluation of patient on day of discharge, and coordination of discharge planning with nursing, social work, pharmacy and case management  Signed: Edwin Dada, MD [facility] 04/23/2022

## 2022-04-23 NOTE — Procedures (Signed)
PROCEDURE SUMMARY:  Successful ultrasound guided paracentesis from the right lower quadrant.  Yielded 6.5 L of straw colored fluid.  No immediate complications.  The patient tolerated the procedure well.   Specimen was sent for labs.  EBL < 109m  The patient has required >/=2 paracenteses in a 30 day period and a screening evaluation by the [facility] has been arranged.

## 2022-04-24 LAB — PATHOLOGIST SMEAR REVIEW

## 2022-04-25 LAB — CULTURE, BODY FLUID W GRAM STAIN -BOTTLE: Culture: NO GROWTH

## 2022-04-26 LAB — MISC LABCORP TEST (SEND OUT): Labcorp test code: 791584

## 2022-05-02 DIAGNOSIS — K7469 Other cirrhosis of liver: Secondary | ICD-10-CM | POA: Diagnosis not present

## 2022-05-06 DIAGNOSIS — U071 COVID-19: Secondary | ICD-10-CM | POA: Diagnosis not present

## 2022-05-06 DIAGNOSIS — Z23 Encounter for immunization: Secondary | ICD-10-CM | POA: Diagnosis not present

## 2022-05-06 DIAGNOSIS — N179 Acute kidney failure, unspecified: Secondary | ICD-10-CM | POA: Diagnosis not present

## 2022-05-06 DIAGNOSIS — K7469 Other cirrhosis of liver: Secondary | ICD-10-CM | POA: Diagnosis not present

## 2022-05-06 DIAGNOSIS — K7031 Alcoholic cirrhosis of liver with ascites: Secondary | ICD-10-CM | POA: Diagnosis not present

## 2022-05-06 DIAGNOSIS — Z7682 Awaiting organ transplant status: Secondary | ICD-10-CM | POA: Diagnosis not present

## 2022-05-07 ENCOUNTER — Ambulatory Visit (INDEPENDENT_AMBULATORY_CARE_PROVIDER_SITE_OTHER): Payer: BC Managed Care – PPO | Admitting: Pulmonary Disease

## 2022-05-07 ENCOUNTER — Encounter: Payer: Self-pay | Admitting: Pulmonary Disease

## 2022-05-07 VITALS — HR 83 | Wt 199.4 lb

## 2022-05-07 DIAGNOSIS — K7031 Alcoholic cirrhosis of liver with ascites: Secondary | ICD-10-CM | POA: Diagnosis not present

## 2022-05-07 DIAGNOSIS — D860 Sarcoidosis of lung: Secondary | ICD-10-CM | POA: Diagnosis not present

## 2022-05-07 DIAGNOSIS — R053 Chronic cough: Secondary | ICD-10-CM

## 2022-05-07 DIAGNOSIS — J301 Allergic rhinitis due to pollen: Secondary | ICD-10-CM | POA: Diagnosis not present

## 2022-05-07 MED ORDER — PREDNISONE 10 MG PO TABS: ORAL_TABLET | ORAL | 0 refills | Status: AC

## 2022-05-07 NOTE — Progress Notes (Signed)
$'@Patient'F$  ID: Trevor Reilly, male    DOB: Jun 18, 1970, 52 y.o.   MRN: [id number]  Chief Complaint  Patient presents with   Follow-up    Pt is here for follow up for cough. Pt states that the cough is still active. Pt states that the flonase is helping some and can see a slight difference.     Referring provider: Delford Field, FNP  HPI:   52 y.o. man whom we are seeing in follow-up for evaluation of sarcoidosis.  Most recent GI/hepatology note reviewed.  H&P and discharge summary 03/2021 reviewed.  Overall, breathing stable.  No real of dyspnea on exertion.    Cough seems worse again.  Historically, this improved with sinus rinses and Flonase.  He says he is continuing his Flonase.  Not doing centimeters since his peers.  He was hospitalized 03/2021 with renal failure.  Felt to be HRS versus volume depletion.  Also with worsening anemia.  Gradually improved with fluids midodrine albumin.  Hospitalization he was also notably positive for COVID.  CT scan demonstrated scattered left greater than right peribronchovascular nodular groundglass opacities.  Suspect related to COVID as opposed to sarcoidosis given previously stable imaging.  Also possibly related to mild volume overload in setting of renal failure.  HPI at initial visit: Patient overall doing well.  Denies any respiratory symptoms.  No shortness of breath, dyspnea.  Diagnosed with sarcoidosis per report 2007.  Via endobronchial biopsy presumably of lymph node.  Was placed on steroids at that time.  Unclear dose.  Was on this for about 10 years.  He was lost to follow-up to prior pulmonologist at [facility].  Has been off prednisone for 4 years.  No development of respiratory symptoms as above.  His primary complaint is cough.  Productive of phlegm.  Associate with significant sinus congestion, nasal congestion, rhinorrhea.  He is unsure if he feels sensation of phlegm dripping on the back of his throat from his nasal passages.   Zyrtec helps some.  Flonase not very helpful per his report.  No other relieving or exacerbating factors that he can identify.  Cough present for many many years.  Reviewed serial chest x-rays 3 from 06/2016 to 03/2020 that on My review interpretation reveals clear lungs without significant changes over the last 4 years.  PMH: Sarcoidosis, seasonal allergies, GERD Surgical history: Liver biopsy, epididymal cyst removal Family history: Cousin and uncle with sarcoidosis, mother with hypertension, father with CVA, CAD Social history: Former smoker, quit 2021, lives in [place], originally from [place] / Pulmonary Flowsheets:   ACT:      No data to display           MMRC:     No data to display           [place]:      No data to display           Tests:   FENO:  No results found for: "NITRICOXIDE"  PFT:     No data to display           WALK:      No data to display           Imaging: Personally reviewed as per EMR discussion this note US Paracentesis  Result Date: 04/23/2022 INDICATION: Patient with history of decompensated cirrhosis with recurrent ascites. Request is for therapeutic and diagnostic paracentesis. EXAM: ULTRASOUND GUIDED THERAPEUTIC AND DIAGNOSTIC PARACENTESIS MEDICATIONS: Lidocaine 1% 10 mL  COMPLICATIONS: None immediate. PROCEDURE: Informed written consent was obtained from the patient after a discussion of the risks, benefits and alternatives to treatment. A timeout was performed prior to the initiation of the procedure. Initial ultrasound scanning demonstrates a large amount of ascites within the right lower abdominal quadrant. The right lower abdomen was prepped and draped in the usual sterile fashion. 1% lidocaine was used for local anesthesia. Following this, a 19 gauge, 7-cm, Yueh catheter was introduced. An ultrasound image was saved for documentation purposes. The paracentesis was performed. The catheter  was removed and a dressing was applied. The patient tolerated the procedure well without immediate post procedural complication. Patient received post-procedure intravenous albumin; see nursing notes for details. FINDINGS: A total of approximately 6.5 L of straw-colored fluid was removed. Samples were sent to the laboratory as requested by the clinical team. IMPRESSION: Successful ultrasound-guided therapeutic and diagnostic paracentesis yielding 6.5 L of peritoneal fluid. Read by: Rushie Nyhan, NP PLAN: The patient has required >/=2 paracenteses in a 30 day period and a formal evaluation by the [facility] has been arranged. Michaelle Birks, MD [facility] Rad[facility] Electronically Signed   By: Michaelle Birks M.D.   On: 04/23/2022 17:30   US Paracentesis  Result Date: 04/22/2022 INDICATION: 52 year old with decompensated cirrhosis, recurrent ascites. Request for diagnostic paracentesis of 200 mL EXAM: ULTRASOUND GUIDED DIAGNOSTIC PARACENTESIS MEDICATIONS: 10 mL 1% lidocaine COMPLICATIONS: None immediate. PROCEDURE: Informed written consent was obtained from the patient after a discussion of the risks, benefits and alternatives to treatment. A timeout was performed prior to the initiation of the procedure. Initial ultrasound scanning demonstrates a moderate amount of ascites within the right lower abdominal quadrant. The right lower abdomen was prepped and draped in the usual sterile fashion. 1% lidocaine was used for local anesthesia. Following this, a 19 gauge, 7-cm, Yueh catheter was introduced. An ultrasound image was saved for documentation purposes. The paracentesis was performed. The catheter was removed and a dressing was applied. The patient tolerated the procedure well without immediate post procedural complication. FINDINGS: A total of approximately 240 mL of yellow, cloudy fluid was removed. Samples  were sent to the laboratory as requested by the clinical team. IMPRESSION: Successful ultrasound-guided paracentesis yielding 240 mL liters of peritoneal fluid. Read by: Brynda Greathouse PA-C PLAN: If the patient eventually requires >/=2 paracenteses in a 30 day period, candidacy for formal evaluation by the [facility] will be assessed. Electronically Signed   By: Aletta Edouard M.D.   On: 04/22/2022 07:45   CT RENAL STONE STUDY  Result Date: 04/19/2022 CLINICAL DATA:  Flank pain, unspecified abdominal pain EXAM: CT ABDOMEN AND PELVIS WITHOUT CONTRAST TECHNIQUE: Multidetector CT imaging of the abdomen and pelvis was performed following the standard protocol without IV contrast. RADIATION DOSE REDUCTION: This exam was performed according to the departmental dose-optimization program which includes automated exposure control, adjustment of the mA and/or kV according to patient size and/or use of iterative reconstruction technique. COMPARISON:  03/19/2022 FINDINGS: Lower chest: There is perihilar architectural distortion, traction bronchiectasis, and ill-defined peribronchovascular ground-glass pulmonary nodules, stable since prior examination in keeping with changes of for treated sarcoidosis. Small left pleural effusion. Cardiac size within normal limits. Hypoattenuation of the cardiac blood pool is keeping with at least moderate anemia. Moderate hiatal hernia. Hepatobiliary: Cirrhotic hepatic morphology. Overall liver size is small. No definite focal intrahepatic mass identified on this noncontrast examination. No intra or extrahepatic  biliary ductal dilation. The gallbladder is mildly distended but is otherwise unremarkable. Pancreas: Unremarkable. No pancreatic ductal dilatation or surrounding inflammatory changes. Spleen: Normal in size without focal abnormality. Adrenals/Urinary Tract: Adrenal glands are unremarkable. Kidneys are normal, without renal  calculi, focal lesion, or hydronephrosis. Bladder is unremarkable. Stomach/Bowel: Large volume ascites appears stable since prior examination. Stomach, small bowel, and large bowel are otherwise unremarkable. Appendix normal. No free intraperitoneal gas. Vascular/Lymphatic: Mild aortoiliac atherosclerotic calcification. No aortic aneurysm. Multiple varicoid densities seen within the mesentery and pre caval retroperitoneum are in keeping with multiple portosystemic collaterals better seen on prior CT examination. No pathologic adenopathy within the abdomen and pelvis. Reproductive: Prostate is unremarkable. Other: Small fluid containing umbilical hernia. Extensive, diffuse body wall subcutaneous edema, similar to prior examination. Musculoskeletal: No acute bone abnormality. No lytic or blastic bone lesion. IMPRESSION: 1. No acute intra-abdominal pathology identified. No definite radiographic explanation for the patient's reported symptoms. 2. Morphologic changes in keeping with cirrhosis and portal venous hypertension with large volume ascites and multiple portosystemic collaterals. 3. Stable changes of treated sarcoidosis within the visualized lung bases. 4. Moderate hiatal hernia. 5. Stable changes of anasarca with diffuse body wall edema. Aortic Atherosclerosis (ICD10-I70.0). Electronically Signed   By: Fidela Salisbury M.D.   On: 04/19/2022 17:41   DG Chest 2 View  Result Date: 04/18/2022 CLINICAL DATA:  Shortness of breath, crackles, and cough for 3-4 days, RIGHT-sided chest and rib pain worse with deep breathing EXAM: CHEST - 2 VIEW COMPARISON:  03/31/2022 FINDINGS: Normal heart size, mediastinal contours, and pulmonary vascularity. Improved bibasilar atelectasis. Persistent peribronchial thickening and accentuation of perihilar markings question bronchitis. Mild residual accentuation of LEFT upper lobe markings. No definite new consolidation, pleural effusion, or pneumothorax. Chronic compression deformity  of a mid to lower thoracic vertebral body unchanged. IMPRESSION: Improved bibasilar infiltrates. Residual bronchitic changes and accentuation of LEFT upper lobe markings. Electronically Signed   By: Lavonia Dana M.D.   On: 04/18/2022 14:40    Lab Results: Personally reviewed CBC    Component Value Date/Time   WBC 10.9 (H) 04/23/2022 0526   RBC 2.11 (L) 04/23/2022 0526   HGB 7.2 (L) 04/23/2022 0526   HCT 22.1 (L) 04/23/2022 0526   PLT 75 (L) 04/23/2022 0526   MCV 104.7 (H) 04/23/2022 0526   MCH 34.1 (H) 04/23/2022 0526   MCHC 32.6 04/23/2022 0526   RDW 22.5 (H) 04/23/2022 0526   LYMPHSABS 1.2 04/19/2022 0445   MONOABS 1.0 04/19/2022 0445   EOSABS 0.4 04/19/2022 0445   BASOSABS 0.2 (H) 04/19/2022 0445    BMET    Component Value Date/Time   NA 130 (L) 04/23/2022 0526   K 4.2 04/23/2022 0526   CL 104 04/23/2022 0526   CO2 22 04/23/2022 0526   GLUCOSE 109 (H) 04/23/2022 0526   BUN 27 (H) 04/23/2022 0526   CREATININE 1.13 04/23/2022 0526   CALCIUM 7.7 (L) 04/23/2022 0526   GFRNONAA >60 04/23/2022 0526   GFRAA >60 09/20/2019 0128    BNP    Component Value Date/Time   BNP 76.1 03/19/2022 1617    ProBNP    Component Value Date/Time   PROBNP 12.7 01/09/2011 1500    Specialty Problems       Pulmonary Problems   Non-seasonal allergic rhinitis due to pollen   OSA (obstructive sleep apnea)    Formatting of this note might be different from the original. Hx of mild      Sarcoidosis of lung ([place])  Lung nodule     No Known Allergies  Immunization History  Administered Date(s) Administered   Influenza, Seasonal, Injecte, Preservative Fre 01/26/2021   Influenza-Unspecified 12/09/2020   Tdap 08/05/2018   Zoster Recombinat (Shingrix) 08/23/2021    Past Medical History:  Diagnosis Date   Allergy    SEASONAL   Chronic cough    05-22-2020 per pt morning productive with yellow sputum   Cirrhosis ([place])    Depression    ED (erectile dysfunction)    Epididymal  cyst    left   Fatty liver    05-22-2020  per pt has been referred to GI,  last ultrasound in epic 06-10-2016   GERD (gastroesophageal reflux disease)    History of community acquired pneumonia 04/13/2020   ED visit in epic   History of rectal bleeding    Hypertension    05-22-2020 followed by new pcp--- no medication currently   Mild obstructive sleep apnea    05-22-2020  per pt had study approx. 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   05-19-2020  currently started seeing new pcp,  previous seen by pulmonolgy , Elijio Miles PA([facility]) lov note 05-20-2016 [facility]   Sarcoidosis of skin    05-22-2020  per pt lower legs   Wears glasses     Tobacco History: Social History   Tobacco Use  Smoking Status Former   Packs/day: 0.50   Years: 25.00   Total pack years: 12.50   Types: Cigarettes   Quit date: 04/25/2019   Years since quitting: 3.0  Smokeless Tobacco Never   Counseling given: Not Answered   Continue to not smoke  Outpatient Encounter Medications as of 05/07/2022  Medication Sig   albuterol (VENTOLIN HFA) 108 (90 Base) MCG/ACT inhaler SMARTSIG:1-2 Puff(s) By Mouth Every 4-6 Hours PRN   fluticasone (FLONASE) 50 MCG/ACT nasal spray Place 1 spray into both nostrils 2 (two) times daily.   furosemide (LASIX) 20 MG tablet Take 1 tablet (20 mg total) by mouth 2 (two) times daily. (Patient taking differently: Take 20 mg by mouth daily.)   lactulose (CHRONULAC) 10 GM/15ML solution Take 30 mLs (20 g total) by mouth 2 (two) times daily.   midodrine (PROAMATINE) 5 MG tablet Take 2 tablets (10 mg total) by mouth 2 (two) times daily with a meal.   pantoprazole (PROTONIX) 40 MG tablet Take 1 tablet (40 mg total) by mouth 2 (two) times daily.   predniSONE (DELTASONE) 10 MG tablet Take 2 tablets (20 mg total) by mouth daily with breakfast for 7 days, THEN  1 tablet (10 mg total) daily with breakfast for 7 days.   silver sulfADIAZINE (SILVADENE) 1 % cream Apply 1 Application topically 2 (two) times a week.   spironolactone (ALDACTONE) 50 MG tablet Take 1 tablet (50 mg total) by mouth daily.   XIFAXAN 550 MG TABS tablet Take 1 tablet (550 mg total) by mouth 2 (two) times daily.   No facility-administered encounter medications on file as of 05/07/2022.     Review of Systems  Review of Systems  N/a Physical Exam  Pulse 83   Wt 199 lb 6.4 oz (90.4 kg)   SpO2 98%   BMI 29.45 kg/m   Wt Readings from Last 5 Encounters:  05/07/22 199 lb 6.4 oz (90.4 kg)  04/23/22 217 lb 8 oz (  98.7 kg)  03/19/22 224 lb 13.9 oz (102 kg)  01/23/22 226 lb 6.6 oz (102.7 kg)  01/09/22 226 lb 6.4 oz (102.7 kg)    BMI Readings from Last 5 Encounters:  05/07/22 29.45 kg/m  04/23/22 32.12 kg/m  03/19/22 33.21 kg/m  01/23/22 33.44 kg/m  01/09/22 33.43 kg/m     Physical Exam General: Well-appearing, no acute distress Eyes: EOMI, icterus Neck: Supple, no JVP Pulmonary: Clear, no work of breathing Cardiovascular: Regular rate and rhythm, no murmurs Abdomen: Nondistended, bowel sounds present MSK: No synovitis, no joint effusion Neuro: Normal gait, no weakness Psych: Normal mood, full affect   Assessment & Plan:   Sarcoidosis: Diagnosed by endobronchial lymph node biopsy 2007 per his report.  Was on steroids for 10+ years.  Initially did have pulmonary or respiratory symptoms of dyspnea on exertion.  These are now resolved.  Off steroids for years.  Given clinical lack of relative symptoms other than cough as well as clear chest imaging no role for steroids.  At least chronic steroids to treat pulmonary sarcoidosis.  Recent chest images with infiltrates likely reflective of COVID-19 versus mild volume overload.  Cough: Chronic.  Unlikely to be related to sarcoidosis.  High suspicion more related to sinus or nasal congestion.  Describes significant  nasal congestion and sputum production.  Cough improved with Flonase and sinus rinses.  He is not as diligent with this.  Continue Flonase, resume sinus rinses.  Brief course of steroids to see if it helps with congestion.  Cirrhosis: Related to alcohol abuse.  No signs of granulomatous inflammation or sarcoid on biopsy.  Undergoing transplant evaluation at [facility] in [place].   Return in about 6 months (around 11/07/2022).   Lanier Clam, MD 05/07/2022  I spent 40 minutes in care of patient including face to face visit, review of records, coordination of care.

## 2022-05-07 NOTE — Patient Instructions (Signed)
Nice to see you again  I still worry the cough is mainly related to the sinuses, continue the Flonase 1 spray each nostril twice a day  You could consider resuming sinus rinses, the bottle and the salt packets we discussed at last visit, twice a day for 1 week then stopping  The CT scan you had while you are in the hospital of your chest demonstrated some signs it could be inflammation.  This may be related to sarcoid or other issues that were happening while in the hospital.  To help treat the cough in case it is related more to sarcoid, use prednisone 20 mg for 1 week then 10 mg for 1 week then stop.  Send me a message on MyChart in a couple of weeks and let me know if you think it helped or not.  We can always try another inhaler in the future with an inhaled steroid but in the past it seems like this has not helped much.  Return to clinic in 6 months or sooner as needed with Dr. Silas Flood

## 2022-05-13 DIAGNOSIS — K7469 Other cirrhosis of liver: Secondary | ICD-10-CM | POA: Diagnosis not present

## 2022-05-19 DIAGNOSIS — E860 Dehydration: Secondary | ICD-10-CM | POA: Diagnosis not present

## 2022-05-19 DIAGNOSIS — I959 Hypotension, unspecified: Secondary | ICD-10-CM | POA: Diagnosis not present

## 2022-05-19 DIAGNOSIS — K7030 Alcoholic cirrhosis of liver without ascites: Secondary | ICD-10-CM | POA: Diagnosis not present

## 2022-05-19 DIAGNOSIS — R531 Weakness: Secondary | ICD-10-CM | POA: Diagnosis not present

## 2022-05-19 DIAGNOSIS — I4510 Unspecified right bundle-branch block: Secondary | ICD-10-CM | POA: Diagnosis not present

## 2022-05-19 DIAGNOSIS — E2740 Unspecified adrenocortical insufficiency: Secondary | ICD-10-CM | POA: Diagnosis not present

## 2022-05-19 DIAGNOSIS — D860 Sarcoidosis of lung: Secondary | ICD-10-CM | POA: Diagnosis not present

## 2022-05-19 DIAGNOSIS — T502X5A Adverse effect of carbonic-anhydrase inhibitors, benzothiadiazides and other diuretics, initial encounter: Secondary | ICD-10-CM | POA: Diagnosis not present

## 2022-05-19 DIAGNOSIS — D696 Thrombocytopenia, unspecified: Secondary | ICD-10-CM | POA: Diagnosis not present

## 2022-05-19 DIAGNOSIS — K7031 Alcoholic cirrhosis of liver with ascites: Secondary | ICD-10-CM | POA: Diagnosis not present

## 2022-05-19 DIAGNOSIS — E875 Hyperkalemia: Secondary | ICD-10-CM | POA: Diagnosis not present

## 2022-05-19 DIAGNOSIS — D539 Nutritional anemia, unspecified: Secondary | ICD-10-CM | POA: Diagnosis not present

## 2022-05-19 DIAGNOSIS — R21 Rash and other nonspecific skin eruption: Secondary | ICD-10-CM | POA: Diagnosis not present

## 2022-05-19 DIAGNOSIS — R350 Frequency of micturition: Secondary | ICD-10-CM | POA: Diagnosis not present

## 2022-05-19 DIAGNOSIS — E871 Hypo-osmolality and hyponatremia: Secondary | ICD-10-CM | POA: Diagnosis not present

## 2022-05-19 DIAGNOSIS — R059 Cough, unspecified: Secondary | ICD-10-CM | POA: Diagnosis not present

## 2022-05-19 DIAGNOSIS — K7290 Hepatic failure, unspecified without coma: Secondary | ICD-10-CM | POA: Diagnosis not present

## 2022-05-19 DIAGNOSIS — N179 Acute kidney failure, unspecified: Secondary | ICD-10-CM | POA: Diagnosis not present

## 2022-05-19 DIAGNOSIS — D649 Anemia, unspecified: Secondary | ICD-10-CM | POA: Diagnosis not present

## 2022-05-19 DIAGNOSIS — K852 Alcohol induced acute pancreatitis without necrosis or infection: Secondary | ICD-10-CM | POA: Diagnosis not present

## 2022-05-20 ENCOUNTER — Other Ambulatory Visit: Payer: Self-pay

## 2022-05-20 DIAGNOSIS — R531 Weakness: Secondary | ICD-10-CM | POA: Diagnosis not present

## 2022-05-20 DIAGNOSIS — I4510 Unspecified right bundle-branch block: Secondary | ICD-10-CM | POA: Diagnosis not present

## 2022-05-20 MED ORDER — PANTOPRAZOLE SODIUM 40 MG PO TBEC: 40.0000 mg | DELAYED_RELEASE_TABLET | Freq: Two times a day (BID) | ORAL | 1 refills | Status: DC

## 2022-05-21 DIAGNOSIS — E871 Hypo-osmolality and hyponatremia: Secondary | ICD-10-CM | POA: Diagnosis not present

## 2022-05-22 DIAGNOSIS — E871 Hypo-osmolality and hyponatremia: Secondary | ICD-10-CM | POA: Diagnosis not present

## 2022-05-27 ENCOUNTER — Telehealth: Payer: Self-pay | Admitting: Pulmonary Disease

## 2022-05-27 DIAGNOSIS — R053 Chronic cough: Secondary | ICD-10-CM

## 2022-05-27 DIAGNOSIS — J301 Allergic rhinitis due to pollen: Secondary | ICD-10-CM

## 2022-05-27 NOTE — Telephone Encounter (Signed)
Called patient and he states that Dr Silas Flood placed him on prednisone in the past and it has not helped so he is wanting a referral sent to ENT. He states him and MH talked about this in the past. Referral order placed. Nothing further needed

## 2022-05-27 NOTE — Telephone Encounter (Signed)
PT calling because Dr. Lemmie Evens has him on Pred and told him if he does not get better he would refer him to an ENT Dr. Is that offer still good. He is not feeling better.  Pls call to advise @ [phone]

## 2022-05-29 ENCOUNTER — Other Ambulatory Visit (HOSPITAL_COMMUNITY): Payer: Self-pay | Admitting: Nurse Practitioner

## 2022-05-29 DIAGNOSIS — K7469 Other cirrhosis of liver: Secondary | ICD-10-CM | POA: Diagnosis not present

## 2022-05-29 DIAGNOSIS — R188 Other ascites: Secondary | ICD-10-CM

## 2022-05-31 ENCOUNTER — Telehealth: Payer: Self-pay | Admitting: Pulmonary Disease

## 2022-05-31 MED ORDER — AZITHROMYCIN 250 MG PO TABS: ORAL_TABLET | ORAL | 0 refills | Status: DC

## 2022-05-31 MED ORDER — PREDNISONE 10 MG PO TABS: ORAL_TABLET | ORAL | 0 refills | Status: DC

## 2022-05-31 NOTE — Telephone Encounter (Signed)
Called and spoke with patient. He verbalized understanding. RXs have been sent to pharmacy.   Nothing further needed at time of call.

## 2022-05-31 NOTE — Telephone Encounter (Signed)
Please send prednisone taper - prednisone 10 mg tablets 4 tabs for 2 days, then 3 tabs for 2 days, 2 tabs for 2 days, then 1 tab for 2 days, then stop and z pack for acute bronchitis. He should try starting OTC allergy pill such as claritin, zyrtec or xyzal and then use flonase nasal spray as sometimes the increased cough can come from postnasal drainage. He can use mucinex DM Twice daily over the counter for cough/congestion. He needs a follow up in 2 weeks to see how he is doing and go over next steps since this is the second time he's required steroids int he last few months. Thanks.

## 2022-05-31 NOTE — Telephone Encounter (Signed)
Pt called the office stating that he has been feeling very fatigued recently and also has been having complaints of SOB.  Pt also has had complaints of wheezing and also complaints of coughing getting up thick phlegm that is yellow in color.   Pt has used his rescue inhaler recently at least 3-4 times daily to see if it would help him breathe any better. Pt said after having to use the rescue inhaler he is able to have some relief for a couple of hours before it wears off. Asked pt if he is on any allergy meds and he said that he is not.  Pt is scheduled to see ENT 5/6 for a consultation appt.   With all that has been happening recently, he wants to know what might be recommended. Routing to provider of the day with Dr. Judeth Horn not being in the office. Katie, please advise.    **Please route to triage and not directly back to me**

## 2022-06-04 DIAGNOSIS — K7469 Other cirrhosis of liver: Secondary | ICD-10-CM | POA: Diagnosis not present

## 2022-06-05 DIAGNOSIS — K7031 Alcoholic cirrhosis of liver with ascites: Secondary | ICD-10-CM | POA: Diagnosis not present

## 2022-06-06 DIAGNOSIS — Z13220 Encounter for screening for lipoid disorders: Secondary | ICD-10-CM | POA: Diagnosis not present

## 2022-06-06 DIAGNOSIS — Z01810 Encounter for preprocedural cardiovascular examination: Secondary | ICD-10-CM | POA: Diagnosis not present

## 2022-06-06 DIAGNOSIS — I517 Cardiomegaly: Secondary | ICD-10-CM | POA: Diagnosis not present

## 2022-06-06 DIAGNOSIS — Z01818 Encounter for other preprocedural examination: Secondary | ICD-10-CM | POA: Diagnosis not present

## 2022-06-06 DIAGNOSIS — I6523 Occlusion and stenosis of bilateral carotid arteries: Secondary | ICD-10-CM | POA: Diagnosis not present

## 2022-06-07 ENCOUNTER — Other Ambulatory Visit: Payer: Self-pay

## 2022-06-07 DIAGNOSIS — K7031 Alcoholic cirrhosis of liver with ascites: Secondary | ICD-10-CM | POA: Diagnosis not present

## 2022-06-07 DIAGNOSIS — K3189 Other diseases of stomach and duodenum: Secondary | ICD-10-CM | POA: Diagnosis not present

## 2022-06-07 DIAGNOSIS — R188 Other ascites: Secondary | ICD-10-CM

## 2022-06-07 DIAGNOSIS — K7682 Hepatic encephalopathy: Secondary | ICD-10-CM | POA: Diagnosis not present

## 2022-06-07 DIAGNOSIS — K766 Portal hypertension: Secondary | ICD-10-CM | POA: Diagnosis not present

## 2022-06-11 ENCOUNTER — Other Ambulatory Visit: Payer: Self-pay | Admitting: Nurse Practitioner

## 2022-06-11 DIAGNOSIS — K7031 Alcoholic cirrhosis of liver with ascites: Secondary | ICD-10-CM | POA: Diagnosis not present

## 2022-06-11 DIAGNOSIS — Z01818 Encounter for other preprocedural examination: Secondary | ICD-10-CM | POA: Diagnosis not present

## 2022-06-11 DIAGNOSIS — R188 Other ascites: Secondary | ICD-10-CM

## 2022-06-11 DIAGNOSIS — K3189 Other diseases of stomach and duodenum: Secondary | ICD-10-CM

## 2022-06-13 DIAGNOSIS — Z01818 Encounter for other preprocedural examination: Secondary | ICD-10-CM | POA: Diagnosis not present

## 2022-06-13 DIAGNOSIS — K7469 Other cirrhosis of liver: Secondary | ICD-10-CM | POA: Diagnosis not present

## 2022-06-17 ENCOUNTER — Inpatient Hospital Stay (HOSPITAL_BASED_OUTPATIENT_CLINIC_OR_DEPARTMENT_OTHER)
Admission: EM | Admit: 2022-06-17 | Discharge: 2022-06-21 | DRG: 369 | Disposition: A | Discharge status: Home / Self Care | Payer: BC Managed Care – PPO | Attending: Internal Medicine | Admitting: Internal Medicine

## 2022-06-17 ENCOUNTER — Other Ambulatory Visit: Payer: Self-pay

## 2022-06-17 ENCOUNTER — Emergency Department (HOSPITAL_BASED_OUTPATIENT_CLINIC_OR_DEPARTMENT_OTHER): Payer: BC Managed Care – PPO

## 2022-06-17 ENCOUNTER — Encounter (HOSPITAL_BASED_OUTPATIENT_CLINIC_OR_DEPARTMENT_OTHER): Payer: Self-pay

## 2022-06-17 DIAGNOSIS — Z823 Family history of stroke: Secondary | ICD-10-CM

## 2022-06-17 DIAGNOSIS — Z6826 Body mass index (BMI) 26.0-26.9, adult: Secondary | ICD-10-CM

## 2022-06-17 DIAGNOSIS — R7989 Other specified abnormal findings of blood chemistry: Secondary | ICD-10-CM | POA: Diagnosis not present

## 2022-06-17 DIAGNOSIS — Z8719 Personal history of other diseases of the digestive system: Secondary | ICD-10-CM

## 2022-06-17 DIAGNOSIS — G4733 Obstructive sleep apnea (adult) (pediatric): Secondary | ICD-10-CM | POA: Diagnosis not present

## 2022-06-17 DIAGNOSIS — F32A Depression, unspecified: Secondary | ICD-10-CM | POA: Diagnosis present

## 2022-06-17 DIAGNOSIS — I959 Hypotension, unspecified: Secondary | ICD-10-CM | POA: Diagnosis present

## 2022-06-17 DIAGNOSIS — Z5941 Food insecurity: Secondary | ICD-10-CM

## 2022-06-17 DIAGNOSIS — E871 Hypo-osmolality and hyponatremia: Secondary | ICD-10-CM | POA: Diagnosis not present

## 2022-06-17 DIAGNOSIS — K7040 Alcoholic hepatic failure without coma: Secondary | ICD-10-CM | POA: Diagnosis not present

## 2022-06-17 DIAGNOSIS — D684 Acquired coagulation factor deficiency: Secondary | ICD-10-CM | POA: Diagnosis present

## 2022-06-17 DIAGNOSIS — K635 Polyp of colon: Secondary | ICD-10-CM | POA: Diagnosis not present

## 2022-06-17 DIAGNOSIS — K7031 Alcoholic cirrhosis of liver with ascites: Secondary | ICD-10-CM | POA: Diagnosis not present

## 2022-06-17 DIAGNOSIS — D638 Anemia in other chronic diseases classified elsewhere: Secondary | ICD-10-CM | POA: Diagnosis not present

## 2022-06-17 DIAGNOSIS — Z800 Family history of malignant neoplasm of digestive organs: Secondary | ICD-10-CM

## 2022-06-17 DIAGNOSIS — Z8349 Family history of other endocrine, nutritional and metabolic diseases: Secondary | ICD-10-CM | POA: Diagnosis not present

## 2022-06-17 DIAGNOSIS — Z8249 Family history of ischemic heart disease and other diseases of the circulatory system: Secondary | ICD-10-CM | POA: Diagnosis not present

## 2022-06-17 DIAGNOSIS — E440 Moderate protein-calorie malnutrition: Secondary | ICD-10-CM | POA: Diagnosis not present

## 2022-06-17 DIAGNOSIS — K760 Fatty (change of) liver, not elsewhere classified: Secondary | ICD-10-CM | POA: Diagnosis present

## 2022-06-17 DIAGNOSIS — R1013 Epigastric pain: Secondary | ICD-10-CM | POA: Diagnosis not present

## 2022-06-17 DIAGNOSIS — T502X5A Adverse effect of carbonic-anhydrase inhibitors, benzothiadiazides and other diuretics, initial encounter: Secondary | ICD-10-CM | POA: Diagnosis present

## 2022-06-17 DIAGNOSIS — K226 Gastro-esophageal laceration-hemorrhage syndrome: Secondary | ICD-10-CM | POA: Diagnosis not present

## 2022-06-17 DIAGNOSIS — N3289 Other specified disorders of bladder: Secondary | ICD-10-CM | POA: Diagnosis not present

## 2022-06-17 DIAGNOSIS — D860 Sarcoidosis of lung: Secondary | ICD-10-CM | POA: Diagnosis present

## 2022-06-17 DIAGNOSIS — I10 Essential (primary) hypertension: Secondary | ICD-10-CM | POA: Diagnosis present

## 2022-06-17 DIAGNOSIS — I700 Atherosclerosis of aorta: Secondary | ICD-10-CM | POA: Diagnosis not present

## 2022-06-17 DIAGNOSIS — D6959 Other secondary thrombocytopenia: Secondary | ICD-10-CM | POA: Diagnosis not present

## 2022-06-17 DIAGNOSIS — F1010 Alcohol abuse, uncomplicated: Secondary | ICD-10-CM | POA: Diagnosis not present

## 2022-06-17 DIAGNOSIS — D696 Thrombocytopenia, unspecified: Secondary | ICD-10-CM | POA: Diagnosis not present

## 2022-06-17 DIAGNOSIS — Z79899 Other long term (current) drug therapy: Secondary | ICD-10-CM

## 2022-06-17 DIAGNOSIS — Z87891 Personal history of nicotine dependence: Secondary | ICD-10-CM | POA: Diagnosis not present

## 2022-06-17 DIAGNOSIS — K219 Gastro-esophageal reflux disease without esophagitis: Secondary | ICD-10-CM | POA: Diagnosis present

## 2022-06-17 DIAGNOSIS — K7460 Unspecified cirrhosis of liver: Secondary | ICD-10-CM | POA: Diagnosis not present

## 2022-06-17 DIAGNOSIS — K440 Diaphragmatic hernia with obstruction, without gangrene: Secondary | ICD-10-CM | POA: Diagnosis not present

## 2022-06-17 DIAGNOSIS — Z801 Family history of malignant neoplasm of trachea, bronchus and lung: Secondary | ICD-10-CM

## 2022-06-17 DIAGNOSIS — K920 Hematemesis: Secondary | ICD-10-CM | POA: Diagnosis not present

## 2022-06-17 DIAGNOSIS — K922 Gastrointestinal hemorrhage, unspecified: Secondary | ICD-10-CM | POA: Diagnosis not present

## 2022-06-17 DIAGNOSIS — J449 Chronic obstructive pulmonary disease, unspecified: Secondary | ICD-10-CM | POA: Diagnosis not present

## 2022-06-17 DIAGNOSIS — D689 Coagulation defect, unspecified: Secondary | ICD-10-CM | POA: Diagnosis not present

## 2022-06-17 DIAGNOSIS — Y92239 Unspecified place in hospital as the place of occurrence of the external cause: Secondary | ICD-10-CM | POA: Diagnosis present

## 2022-06-17 DIAGNOSIS — K449 Diaphragmatic hernia without obstruction or gangrene: Secondary | ICD-10-CM | POA: Diagnosis present

## 2022-06-17 DIAGNOSIS — Z86010 Personal history of colonic polyps: Secondary | ICD-10-CM

## 2022-06-17 LAB — URINALYSIS, ROUTINE W REFLEX MICROSCOPIC
Glucose, UA: NEGATIVE mg/dL
Hgb urine dipstick: NEGATIVE
Ketones, ur: NEGATIVE mg/dL
Leukocytes,Ua: NEGATIVE
Nitrite: NEGATIVE
Protein, ur: NEGATIVE mg/dL
Specific Gravity, Urine: 1.02 (ref 1.005–1.030)
pH: 5.5 (ref 5.0–8.0)

## 2022-06-17 LAB — COMPREHENSIVE METABOLIC PANEL
ALT: 43 U/L (ref 0–44)
AST: 131 U/L — ABNORMAL HIGH (ref 15–41)
Albumin: 2.1 g/dL — ABNORMAL LOW (ref 3.5–5.0)
Alkaline Phosphatase: 131 U/L — ABNORMAL HIGH (ref 38–126)
Anion gap: 7 (ref 5–15)
BUN: 24 mg/dL — ABNORMAL HIGH (ref 6–20)
CO2: 22 mmol/L (ref 22–32)
Calcium: 8 mg/dL — ABNORMAL LOW (ref 8.9–10.3)
Chloride: 91 mmol/L — ABNORMAL LOW (ref 98–111)
Creatinine, Ser: 1.02 mg/dL (ref 0.61–1.24)
GFR, Estimated: >60 mL/min (ref >60)
Glucose, Bld: 130 mg/dL — ABNORMAL HIGH (ref 70–99)
Potassium: 4.6 mmol/L (ref 3.5–5.1)
Sodium: 120 mmol/L — ABNORMAL LOW (ref 135–145)
Total Bilirubin: 7.9 mg/dL — ABNORMAL HIGH (ref 0.3–1.2)
Total Protein: 6.9 g/dL (ref 6.5–8.1)

## 2022-06-17 LAB — CBC
HCT: 31 % — ABNORMAL LOW (ref 39.0–52.0)
Hemoglobin: 10.5 g/dL — ABNORMAL LOW (ref 13.0–17.0)
MCH: 33.8 pg (ref 26.0–34.0)
MCHC: 33.9 g/dL (ref 30.0–36.0)
MCV: 99.7 fL (ref 80.0–100.0)
Platelets: 99 10*3/uL — ABNORMAL LOW (ref 150–400)
RBC: 3.11 MIL/uL — ABNORMAL LOW (ref 4.22–5.81)
RDW: 17.2 % — ABNORMAL HIGH (ref 11.5–15.5)
WBC: 8.3 10*3/uL (ref 4.0–10.5)
nRBC: 0 % (ref 0.0–0.2)

## 2022-06-17 LAB — LIPASE, BLOOD: Lipase: 70 U/L — ABNORMAL HIGH (ref 11–51)

## 2022-06-17 MED ORDER — FENTANYL CITRATE PF 50 MCG/ML IJ SOSY
50.0000 ug | PREFILLED_SYRINGE | INTRAMUSCULAR | Status: DC | PRN
  Administered 2022-06-17: 50 ug via INTRAVENOUS
  Filled 2022-06-17: qty 1

## 2022-06-17 MED ORDER — ONDANSETRON HCL 4 MG/2ML IJ SOLN
4.0000 mg | Freq: Once | INTRAMUSCULAR | Status: AC | PRN
  Administered 2022-06-17: 4 mg via INTRAVENOUS
  Filled 2022-06-17: qty 2

## 2022-06-17 MED ORDER — MORPHINE SULFATE (PF) 2 MG/ML IV SOLN
2.0000 mg | Freq: Once | INTRAVENOUS | Status: AC
  Administered 2022-06-17: 2 mg via INTRAVENOUS
  Filled 2022-06-17: qty 1

## 2022-06-17 MED ORDER — IOHEXOL 300 MG/ML  SOLN
100.0000 mL | Freq: Once | INTRAMUSCULAR | Status: AC | PRN
  Administered 2022-06-17: 100 mL via INTRAVENOUS

## 2022-06-17 MED ORDER — LACTATED RINGERS IV BOLUS
1000.0000 mL | Freq: Once | INTRAVENOUS | Status: AC
  Administered 2022-06-17: 1000 mL via INTRAVENOUS

## 2022-06-17 NOTE — ED Provider Notes (Signed)
[facility] Emergency Department Provider Note MRN:  [id number]  Arrival date & time: 06/18/22     Chief Complaint   Hematemesis   History of Present Illness   Trevor Reilly is a 52 y.o. year-old male with a history of cirrhosis presenting to the ED with chief complaint of hematemesis.  Abdominal pain this evening, epigastric, also some vomiting of blood, also had some blood in the nose.  Bleeding has since stopped, pain is still present.  No chest pain or shortness of breath.  Review of Systems  A thorough review of systems was obtained and all systems are negative except as noted in the HPI and PMH.   Patient's Health History    Past Medical History:  Diagnosis Date   Allergy    SEASONAL   Chronic cough    05-22-2020 per pt morning productive with yellow sputum   Cirrhosis    Depression    ED (erectile dysfunction)    Epididymal cyst    left   Fatty liver    05-22-2020  per pt has been referred to GI,  last ultrasound in epic 06-10-2016   GERD (gastroesophageal reflux disease)    History of community acquired pneumonia 04/13/2020   ED visit in epic   History of rectal bleeding    Hypertension    05-22-2020 followed by new pcp--- no medication currently   Mild obstructive sleep apnea    05-22-2020  per pt had study approx. 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59   CYSTOSCOPY  05/24/2020   Procedure: CYSTOSCOPY FLEXIBLE;  Surgeon: Crista Elliot, MD;  Location: [facility];  Service: Urology;;    EPIDIDYMECTOMY Left 05/24/2020   Procedure: LEFT EPIDIDYMAL CYST REMOVAL;  Surgeon: Crista Elliot, MD;  Location: [facility];  Service: Urology;  Laterality: Left;   IR PARACENTESIS  09/14/2021   IR PARACENTESIS  04/01/2022   IR TRANSCATHETER BX  10/03/2021   IR US GUIDE VASC ACCESS RIGHT  10/03/2021   IR VENOGRAM HEPATIC W HEMODYNAMIC EVALUATION  10/03/2021   LUNG BIOPSY  2007   IR   CT guided needle lung biopsy's and washing's     Family History  Problem Relation Age of Onset   Gout Mother    Hypertension Mother    Thyroid disease Mother    Stroke Father    Heart attack Father    Thyroid disease Sister    Hypertension Brother    Lung cancer Brother    Colon cancer Maternal Grandmother 54   Sarcoidosis Cousin    Crohn's disease Neg Hx    Esophageal cancer Neg Hx    Rectal cancer Neg Hx    Stomach cancer Neg Hx     Social History   Socioeconomic History   Marital status: Single    Spouse name: Not on file   Number of children: Not on file   Years of education: Not  on file   Highest education level: Not on file  Occupational History   Not on file  Tobacco Use   Smoking status: Former    Packs/day: 0.50    Years: 25.00    Additional pack years: 0.00    Total pack years: 12.50    Types: Cigarettes    Quit date: 04/25/2019    Years since quitting: 3.1   Smokeless tobacco: Never  Vaping Use   Vaping Use: Never used  Substance and Sexual Activity   Alcohol use: Not Currently    Alcohol/week: 2.0 standard drinks of alcohol    Types: 2 Standard drinks or equivalent per week    Comment: not drank in approx. 1 year   Drug use: Never   Sexual activity: Not on file  Other Topics Concern   Not on file  Social History Narrative   Not on file   Social Determinants of Health   Financial Resource Strain: Not on file  Food Insecurity: Food Insecurity Present (04/18/2022)   Hunger Vital Sign    Worried About Running Out of Food in the Last Year: Sometimes true     Ran Out of Food in the Last Year: Sometimes true  Transportation Needs: No Transportation Needs (04/18/2022)   PRAPARE - Administrator, Civil Service (Medical): No    Lack of Transportation (Non-Medical): No  Physical Activity: Not on file  Stress: Not on file  Social Connections: Not on file  Intimate Partner Violence: Not At Risk (04/18/2022)   Humiliation, Afraid, Rape, and Kick questionnaire    Fear of Current or Ex-Partner: No    Emotionally Abused: No    Physically Abused: No    Sexually Abused: No     Physical Exam   Vitals:   06/18/22 0400 06/18/22 0645  BP: 113/65 113/66  Pulse: 83 85  Resp: (!) 30 14  Temp:    SpO2: 93% 94%    CONSTITUTIONAL: Well-appearing, NAD NEURO/PSYCH:  Alert and oriented x 3, no focal deficits EYES:  eyes equal and reactive ENT/NECK:  no LAD, no JVD CARDIO: Regular rate, well-perfused, normal S1 and S2 PULM:  CTAB no wheezing or rhonchi GI/GU:  non-distended, mild epigastric tenderness MSK/SPINE:  No gross deformities, no edema SKIN:  no rash, atraumatic   *Additional and/or pertinent findings included in MDM below  Diagnostic and Interventional Summary    EKG Interpretation  Date/Time:  Monday June 17 2022 20:25:26 EDT Ventricular Rate:  72 PR Interval:  111 QRS Duration: 125 QT Interval:  424 QTC Calculation: 464 R Axis:   51 Text Interpretation: Sinus rhythm Borderline short PR interval Right bundle branch block when compared to prior, similar appearance with some t wave inversions. No STEMI Confirmed by Theda Belfast ([id number]) on 06/17/2022 10:27:54 PM       Labs Reviewed  LIPASE, BLOOD - Abnormal; Notable for the following components:      Result Value   Lipase 70 (*)    All other components within normal limits  COMPREHENSIVE METABOLIC PANEL - Abnormal; Notable for the following components:   Sodium 120 (*)    Chloride 91 (*)    Glucose, Bld 130 (*)    BUN 24 (*)    Calcium 8.0 (*)    Albumin 2.1  (*)    AST 131 (*)    Alkaline Phosphatase 131 (*)    Total Bilirubin 7.9 (*)    All other components within normal limits  CBC - Abnormal; Notable  for the following components:   RBC 3.11 (*)    Hemoglobin 10.5 (*)    HCT 31.0 (*)    RDW 17.2 (*)    Platelets 99 (*)    All other components within normal limits  URINALYSIS, ROUTINE W REFLEX MICROSCOPIC - Abnormal; Notable for the following components:   Color, Urine AMBER (*)    Bilirubin Urine SMALL (*)    All other components within normal limits    CT ABDOMEN PELVIS W CONTRAST  Final Result      Medications  fentaNYL (SUBLIMAZE) injection 50 mcg (50 mcg Intravenous Given 06/17/22 2033)  ondansetron (ZOFRAN) injection 4 mg (4 mg Intravenous Given 06/17/22 2033)  lactated ringers bolus 1,000 mL (0 mLs Intravenous Stopped 06/18/22 0051)  morphine (PF) 2 MG/ML injection 2 mg (2 mg Intravenous Given 06/17/22 2342)  iohexol (OMNIPAQUE) 300 MG/ML solution 100 mL (100 mLs Intravenous Contrast Given 06/17/22 2318)     Procedures  /  Critical Care .Critical Care  Performed by: Sabas Sous, MD Authorized by: Sabas Sous, MD   Critical care provider statement:    Critical care time (minutes):  35   Critical care was necessary to treat or prevent imminent or life-threatening deterioration of the following conditions: Hyponatremia.   Critical care was time spent personally by me on the following activities:  Development of treatment plan with patient or surrogate, discussions with consultants, evaluation of patient's response to treatment, examination of patient, ordering and review of laboratory studies, ordering and review of radiographic studies, ordering and performing treatments and interventions, pulse oximetry, re-evaluation of patient's condition and review of old charts   ED Course and Medical Decision Making  Initial Impression and Ddx Differential diagnosis includes variceal bleeding, upper GI bleeding, pancreatitis,  cholecystitis, bleeding from a sinus infection or epistaxis.  Reassuring vitals at this time, suspect not actively bleeding given that it has been several hours since he has bled.  Past medical/surgical history that increases complexity of ED encounter: Cirrhosis  Interpretation of Diagnostics I personally reviewed the EKG and my interpretation is as follows: Sinus rhythm  Labs reveal hyponatremia with a level of 120, elevated bilirubin similar to prior values. CT scan revealing likely new evidence of varices.   Patient Reassessment and Ultimate Disposition/Management     Given the hematemesis and evidence of new varices patient would likely benefit from observation and repeat endoscopy.  Hyponatremia as well, admitted to medicine.  Patient management required discussion with the following services or consulting groups:  Hospitalist Service  Complexity of Problems Addressed Acute illness or injury that poses threat of life of bodily function  Additional Data Reviewed and Analyzed Further history obtained from: Prior labs/imaging results  Additional Factors Impacting ED Encounter Risk Use of parenteral controlled substances and Consideration of hospitalization  Elmer Sow. Pilar Plate, MD [facility] Emergency Medicine [facility] [email or web address]  Final Clinical Impressions(s) / ED Diagnoses     ICD-10-CM   1. Hematemesis, unspecified whether nausea present  K92.0     2. Epigastric pain  R10.13       ED Discharge Orders     None        Discharge Instructions Discussed with and Provided to Patient:   Discharge Instructions   None      Sabas Sous, MD 06/18/22 [id number]

## 2022-06-17 NOTE — ED Triage Notes (Addendum)
Pt has hx of cirrhosis and has been vomiting blood and coughing up blood with and mid ABD pain.   At this time both are controlled.

## 2022-06-18 DIAGNOSIS — Z8249 Family history of ischemic heart disease and other diseases of the circulatory system: Secondary | ICD-10-CM | POA: Diagnosis not present

## 2022-06-18 DIAGNOSIS — R7989 Other specified abnormal findings of blood chemistry: Secondary | ICD-10-CM | POA: Diagnosis not present

## 2022-06-18 DIAGNOSIS — D860 Sarcoidosis of lung: Secondary | ICD-10-CM

## 2022-06-18 DIAGNOSIS — K920 Hematemesis: Secondary | ICD-10-CM | POA: Diagnosis not present

## 2022-06-18 DIAGNOSIS — F32A Depression, unspecified: Secondary | ICD-10-CM | POA: Diagnosis not present

## 2022-06-18 DIAGNOSIS — K621 Rectal polyp: Secondary | ICD-10-CM

## 2022-06-18 DIAGNOSIS — D638 Anemia in other chronic diseases classified elsewhere: Secondary | ICD-10-CM | POA: Diagnosis not present

## 2022-06-18 DIAGNOSIS — Y92239 Unspecified place in hospital as the place of occurrence of the external cause: Secondary | ICD-10-CM | POA: Diagnosis not present

## 2022-06-18 DIAGNOSIS — I700 Atherosclerosis of aorta: Secondary | ICD-10-CM | POA: Diagnosis not present

## 2022-06-18 DIAGNOSIS — Z823 Family history of stroke: Secondary | ICD-10-CM | POA: Diagnosis not present

## 2022-06-18 DIAGNOSIS — G4733 Obstructive sleep apnea (adult) (pediatric): Secondary | ICD-10-CM | POA: Diagnosis not present

## 2022-06-18 DIAGNOSIS — D6959 Other secondary thrombocytopenia: Secondary | ICD-10-CM | POA: Diagnosis not present

## 2022-06-18 DIAGNOSIS — K7031 Alcoholic cirrhosis of liver with ascites: Secondary | ICD-10-CM | POA: Diagnosis not present

## 2022-06-18 DIAGNOSIS — K226 Gastro-esophageal laceration-hemorrhage syndrome: Secondary | ICD-10-CM | POA: Diagnosis not present

## 2022-06-18 DIAGNOSIS — D696 Thrombocytopenia, unspecified: Secondary | ICD-10-CM | POA: Diagnosis not present

## 2022-06-18 DIAGNOSIS — I959 Hypotension, unspecified: Secondary | ICD-10-CM | POA: Diagnosis not present

## 2022-06-18 DIAGNOSIS — R1013 Epigastric pain: Secondary | ICD-10-CM | POA: Diagnosis not present

## 2022-06-18 DIAGNOSIS — E871 Hypo-osmolality and hyponatremia: Secondary | ICD-10-CM | POA: Diagnosis not present

## 2022-06-18 DIAGNOSIS — Z800 Family history of malignant neoplasm of digestive organs: Secondary | ICD-10-CM | POA: Diagnosis not present

## 2022-06-18 DIAGNOSIS — D684 Acquired coagulation factor deficiency: Secondary | ICD-10-CM | POA: Diagnosis not present

## 2022-06-18 DIAGNOSIS — K219 Gastro-esophageal reflux disease without esophagitis: Secondary | ICD-10-CM | POA: Diagnosis not present

## 2022-06-18 DIAGNOSIS — Z8349 Family history of other endocrine, nutritional and metabolic diseases: Secondary | ICD-10-CM | POA: Diagnosis not present

## 2022-06-18 DIAGNOSIS — Z79899 Other long term (current) drug therapy: Secondary | ICD-10-CM | POA: Diagnosis not present

## 2022-06-18 DIAGNOSIS — K7040 Alcoholic hepatic failure without coma: Secondary | ICD-10-CM | POA: Diagnosis not present

## 2022-06-18 DIAGNOSIS — Z801 Family history of malignant neoplasm of trachea, bronchus and lung: Secondary | ICD-10-CM | POA: Diagnosis not present

## 2022-06-18 DIAGNOSIS — Z87891 Personal history of nicotine dependence: Secondary | ICD-10-CM | POA: Diagnosis not present

## 2022-06-18 DIAGNOSIS — K635 Polyp of colon: Secondary | ICD-10-CM

## 2022-06-18 DIAGNOSIS — E440 Moderate protein-calorie malnutrition: Secondary | ICD-10-CM | POA: Diagnosis not present

## 2022-06-18 DIAGNOSIS — F1010 Alcohol abuse, uncomplicated: Secondary | ICD-10-CM | POA: Diagnosis not present

## 2022-06-18 DIAGNOSIS — I10 Essential (primary) hypertension: Secondary | ICD-10-CM | POA: Diagnosis not present

## 2022-06-18 LAB — PROTIME-INR
INR: 1.7 — ABNORMAL HIGH (ref 0.8–1.2)
Prothrombin Time: 20.1 seconds — ABNORMAL HIGH (ref 11.4–15.2)

## 2022-06-18 LAB — MRSA NEXT GEN BY PCR, NASAL: MRSA by PCR Next Gen: NOT DETECTED

## 2022-06-18 MED ORDER — RIFAXIMIN 550 MG PO TABS
550.0000 mg | ORAL_TABLET | Freq: Two times a day (BID) | ORAL | Status: DC
  Administered 2022-06-18 – 2022-06-21 (×5): 550 mg via ORAL
  Filled 2022-06-18 (×5): qty 1

## 2022-06-18 MED ORDER — ORAL CARE MOUTH RINSE: 15.0000 mL | OROMUCOSAL | Status: DC | PRN

## 2022-06-18 MED ORDER — FUROSEMIDE 40 MG PO TABS: 60.0000 mg | ORAL_TABLET | Freq: Every day | ORAL | Status: DC

## 2022-06-18 MED ORDER — CHLORHEXIDINE GLUCONATE CLOTH 2 % EX PADS: 6.0000 | MEDICATED_PAD | Freq: Every day | CUTANEOUS | Status: DC

## 2022-06-18 MED ORDER — MIDODRINE HCL 5 MG PO TABS
10.0000 mg | ORAL_TABLET | Freq: Two times a day (BID) | ORAL | Status: DC
  Administered 2022-06-18 – 2022-06-21 (×6): 10 mg via ORAL
  Filled 2022-06-18 (×6): qty 2

## 2022-06-18 MED ORDER — PANTOPRAZOLE SODIUM 40 MG IV SOLR
40.0000 mg | Freq: Two times a day (BID) | INTRAVENOUS | Status: DC
  Administered 2022-06-18 – 2022-06-20 (×4): 40 mg via INTRAVENOUS
  Filled 2022-06-18 (×5): qty 10

## 2022-06-18 MED ORDER — CHLORHEXIDINE GLUCONATE CLOTH 2 % EX PADS
6.0000 | MEDICATED_PAD | Freq: Every day | CUTANEOUS | Status: DC
  Administered 2022-06-18: 6 via TOPICAL

## 2022-06-18 MED ORDER — SODIUM CHLORIDE 0.9 % IV SOLN
2.0000 g | INTRAVENOUS | Status: DC
  Administered 2022-06-18 – 2022-06-20 (×3): 2 g via INTRAVENOUS
  Filled 2022-06-18 (×3): qty 20

## 2022-06-18 MED ORDER — LACTULOSE 10 GM/15ML PO SOLN: 20.0000 g | Freq: Two times a day (BID) | ORAL | Status: DC

## 2022-06-18 MED ORDER — FUROSEMIDE 20 MG PO TABS: 20.0000 mg | ORAL_TABLET | Freq: Every day | ORAL | Status: DC

## 2022-06-18 MED ORDER — LACTULOSE 10 GM/15ML PO SOLN
20.0000 g | Freq: Three times a day (TID) | ORAL | Status: DC
  Administered 2022-06-18 – 2022-06-21 (×8): 20 g via ORAL
  Filled 2022-06-18 (×8): qty 30

## 2022-06-18 MED ORDER — CHLORHEXIDINE GLUCONATE CLOTH 2 % EX PADS
6.0000 | MEDICATED_PAD | Freq: Every day | CUTANEOUS | Status: DC
  Administered 2022-06-18 – 2022-06-21 (×4): 6 via TOPICAL

## 2022-06-18 MED ORDER — SPIRONOLACTONE 25 MG PO TABS
50.0000 mg | ORAL_TABLET | Freq: Every day | ORAL | Status: DC
  Administered 2022-06-18: 50 mg via ORAL
  Filled 2022-06-18: qty 2

## 2022-06-18 NOTE — Progress Notes (Signed)
Plan of Care Note for accepted transfer   Patient: Trevor Reilly MRN: [id number]   DOA: 06/17/2022  Facility requesting transfer: [facility] ED  Requesting Provider: Dr. Pilar Plate, EDP Reason for transfer: Hematemesis in the setting of decompensated cirrhosis.  Facility course: The patient is a 52 year old male with past medical history significant for alcoholic cirrhosis, decompensated liver cirrhosis with previous large-volume paracentesis (ascites are now controlled with home diuretics), history of hepatic encephalopathy, portal hypertensive gastropathy, [facility], past alcohol use, pulmonary sarcoidosis, who presents to [facility] ED with complaints of hematemesis.  Endorses vomiting blood today.  Also endorses nasal bleeding.  Associated with epigastric pain.  In the ED, vital signs are stable.  Afebrile with no leukocytosis.  Lab studies are remarkable for serum sodium 120, elevated liver chemistries, alkaline phosphatase 131, AST 131, ALT 43, T. bili 7.9.  Hemoglobin 10.5, platelet count 99.  CT abdomen and pelvis with contrast revealed the following: 1. Mild urinary bladder wall thickening, may be due to nondistention, however recommend correlation with urinalysis to exclude urinary tract infection. 2. Cirrhosis with large portosystemic varices, most prominently affecting the right lower quadrant. Trace perihepatic and pelvic ascites. Aortic Atherosclerosis (ICD10-I70.0).  The patient received 1 L LR IV fluid bolus x 1, IV antiemetics Zofran 4 mg x 1, IV opioid-based analgesics morphine 2 mg x 1.  Admitted to [facility] stepdown unit as inpatient status.  EDP will consult GI.  Plan of care: The patient is accepted for admission to [facility] unit, at [facility].  Author: Darlin Drop, DO 06/18/2022  Check [email or web address] for on-call coverage.  Nursing staff, Please call [facility] Admits & Consults System-Wide number on Amion as soon as patient's  arrival, so appropriate admitting provider can evaluate the pt.

## 2022-06-18 NOTE — Consult Note (Addendum)
Referring Provider: Dr. Shonna Chock Primary Care Physician:  Sherral Hammers, FNP Primary Gastroenterologist: Dr. Chales Abrahams  Reason for Consultation: GI bleed, hematemesis  HPI: Trevor Reilly is a 52 y.o. male  Trevor Reilly. Gaunt is a 52 year old male with a past medical history of depression, sarcoidosis, hypertension, RBBB, GERD, adenomatous colon polyps, tubulovillous rectal polyp and alcohol associated decompensated cirrhosis with ascites, portal hypertensive gastropathy and hepatic encephalopathy. He is undergoing liver transplant evaluation at [facility], followed by Annamarie Major NP.   He presented to [facility] ED 06/16/2021 with yesterday due to having epigastric pain with N/V and reported hematemesis. Labs in the ED showed a WBC count 8.3.  Hemoglobin 10.5 (Hg 7.2 on 04/23/2022).  Hematocrit 31.0.  MCV 99.7.  Platelets 99.  Sodium 120.  Potassium 4.6.  Glucose 130.  BUN 24.  Creatinine 1.02.  Albumin 2.1.  Total bili 7.9.  Alk phos 131.  AST 131.  ALT 43.  INR pending. CTAP with contrast identified cirrhosis with large portosystemic varices with trace perihepatic/pelvic ascites without evidence of any liver lesions/hepatomas.  He developed epigastric pain earlier yesterday and after he ate bananas, chicken and noodles he developed nausea and vomited up the food he had just eaten.  He vomited a few times until his stomach emptied.  He did not see any blood in his emesis but after vomiting he started spitting and saw specks of bright blood in his spit.  No heartburn or dysphagia.  No NSAID use.  He passes normal brown stools at home.  No rectal bleeding or black stools.  He underwent an EGD 07/13/2021 which showed a small lateral hernia and portal hypertensive gastropathy without evidence of esophageal or gastric varices.  Biopsies were negative for H. 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  A colonoscopy was done at  the same time which identified 6 tubular adenomatous polyps removed from the colon and one 10 mm tubulovillous adenoma was removed from the rectum.  Symptoms feels a little foggy headed but denies significant confusion.  He is adherent with taking lactulose and Xifaxan as prescribed.  His last alcohol consumption was 06/2021.  He was previously admitted to the hospital 04/18/2022 with chest pain secondary to a rib fracture with fever, chills and shortness of breath. Admission labs showed AKI with a creatinine level of 2.9 concerning for hepatorenal syndrome versus prerenal etiology/dehydration.  He received albumin, midodrine and his renal status stabilized.  He tested positive for COVID but was asymptomatic.  His hemoglobin dropped to 6.3 on 2/25 and he received 1 unit PRBCs without evidence of active GI bleeding throughout his hospitalization.  FOBT was negative.  He underwent a diagnostic paracentesis 04/19/2022 and 240 mL of peritoneal fluid was removed without evidence of SBP.  He underwent a repeat paracentesis 04/23/2022, 6.5 L of peritoneal fluid was removed without evidence of SBP was discharged home the same day.   He was last seen by Annamarie Major NP at [facility] on 06/07/2022.  At that time, he was instructed to continue midodrine 10 mg p.o. twice daily, spironolactone 50 mg twice daily, lactulose 20 g p.o. 4 times daily Xifaxan 550 mg twice daily. He was started on Lasix 40 mg daily.  MELD 3.0 = 25.  A triple phase abdominal CT was ordered, yet completed.  He was instructed to follow-up in 4 weeks.   GI PROCEDURES:  EGD  07/13/2021 by Dr. Chales Abrahams:  - Small hiatal hernia. - Portal hypertensive gastropathy. Biopsied. - Normal examined duodenum.   COLONOSCOPY 07/13/2021 by Dr. Chales Abrahams:  - Four 6 to 8 mm polyps in the proximal ascending  colon and in the mid ascending colon, removed with  a cold snare. Resected and retrieved. - Two 4 to 6 mm polyps in the mid descending colon,   removed with a cold snare. Resected and retrieved. - One 10 mm polyp in the proximal rectum, removed  with a hot snare. Resected and retrieved. - The examined portion of the ileum was normal. - Non-bleeding internal hemorrhoids. - The examination was otherwise normal on direct  and retroflexion views.  - 3 year recall colonoscopy  1. Surgical [P], random sites gastric CHRONIC GASTRITIS WITH MINUTE NONCASEATING GRANULOMAS IN THE LAMINA PROPRIA. H. PYLORI, INTESTINAL METAPLASIA, ATROPHY AND DYSPLASIA ARE NOT IDENTIFIED. AFB AND GMS STAINS ARE NEGATIVE FOR MICROORGANISMS IN THE GRANULOMAS PLEASE SEE COMMENT. COMMENT: THE GRANULOMAS IDENTIFIED IN ONE OF THE BIOPSY FRAGMENTS OF GASTRIC MUCOSA APPEAR TO BE NONINFECTIOUS. POSSIBILITY OF SARCOIDOSIS CANNOT BE EXCLUDED. 2. Surgical [P], colon, ascending, polyp (4) TUBULAR ADENOMAS. NEGATIVE FOR HIGH-GRADE DYSPLASIA. 3. Surgical [P], colon, descending, polyp (2) TUBULAR ADENOMA AND A HYPERPLASTIC POLYP. NEGATIVE FOR HIGH-GRADE DYSPLASIA. 4. Surgical [P], colon, rectum, polyp (1) TUBULOVILLOUS ADENOMA. NEGATIVE FOR HIGH-GRADE DYSPLASIA.  Past Medical History:  Diagnosis Date  . Allergy    SEASONAL  . Chronic cough    05-22-2020 per pt morning productive with yellow sputum  . Cirrhosis   . Depression   . ED (erectile dysfunction)   . Epididymal cyst    left  . Fatty liver    05-22-2020  per pt has been referred to GI,  last ultrasound in epic 06-10-2016  . GERD (gastroesophageal reflux disease)   . History of community acquired pneumonia 04/13/2020   ED visit in epic  . History of rectal bleeding   . Hypertension    05-22-2020 followed by new pcp--- no medication currently  . Mild obstructive sleep apnea    05-22-2020  per pt had study approx. 10 yrs ago, told mild [facility]sa uses cpap until hew years ago , unable to find  . Nocturia   . RBBB (right bundle branch block)   . Sarcoidosis of lung primary 2007   05-19-2020  currently  started seeing new pcp,  previous seen by pulmonolgy , Lysle Pearl PA([facility]) lov note 05-20-2016 care everywhere  . Sarcoidosis of skin    05-22-2020  per pt lower legs  . Wears glasses     Past Surgical History:  Procedure Laterality Date  . 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 CYSTOSCOPY  05/24/2020   Procedure: CYSTOSCOPY FLEXIBLE;  Surgeon: Crista Elliot, MD;  Location: [facility];  Service: Urology;;  . EPIDIDYMECTOMY Left 05/24/2020   Procedure: LEFT EPIDIDYMAL CYST REMOVAL;  Surgeon: Crista Elliot, MD;  Location[facility];  Service: Urology;  Laterality: Left;  . IR PARACENTESIS  09/14/2021  . IR PARACENTESIS  04/01/2022  . IR TRANSCATHETER BX  10/03/2021  . IR US GUIDE VASC ACCESS RIGHT  10/03/2021  . IR VENOGRAM HEPATIC W HEMODYNAMIC EVALUATION  10/03/2021  . LUNG BIOPSY  2007   IR   CT guided needle lung biopsy's and washing's     Prior to Admission medications   Medication Sig Start Date End Date Taking? Authorizing Provider  albuterol (VENTOLIN HFA) 108 (90  Base) MCG/ACT inhaler SMARTSIG:1-2 Puff(s) By Mouth Every 4-6 Hours PRN    [provider]  azithromycin (ZITHROMAX) 250 MG tablet Take 2 tablets on first day, then 1 tablet daily until finished 05/31/22   Cobb, Ruby Cola, NP  fluticasone (FLONASE) 50 MCG/ACT nasal spray Place 1 spray into both nostrils 2 (two) times daily. 01/09/22   Hunsucker, Lesia Sago, MD  furosemide (LASIX) 20 MG tablet Take 1 tablet (20 mg total) by mouth 2 (two) times daily. Patient taking differently: Take 20 mg by mouth daily. 02/08/22   Curatolo, Adam, DO  lactulose (CHRONULAC) 10 GM/15ML solution Take 30 mLs (20 g total) by mouth 2 (two) times daily. 04/23/22   Danford, Earl Lites, MD  midodrine (PROAMATINE) 5 MG tablet Take 2 tablets (10 mg total) by mouth 2 (two) times daily with a meal. 04/23/22   Danford, Earl Lites, MD  pantoprazole (PROTONIX) 40 MG tablet Take 1 tablet (40 mg  total) by mouth 2 (two) times daily. Please call [phone] to schedule an office visit for more refills 05/20/22   Lynann Bologna, MD  predniSONE (DELTASONE) 10 MG tablet Take 4 tabs for 2 days, then 3 tabs for 2 days, 2 tabs for 2 days, then 1 tab for 2 days, then stop. 05/31/22   Cobb, Ruby Cola, NP  silver sulfADIAZINE (SILVADENE) 1 % cream Apply 1 Application topically 2 (two) times a week.    [provider]  spironolactone (ALDACTONE) 50 MG tablet Take 1 tablet (50 mg total) by mouth daily. 02/08/22   Curatolo, Adam, DO  XIFAXAN 550 MG TABS tablet Take 1 tablet (550 mg total) by mouth 2 (two) times daily. 04/23/22   Danford, Earl Lites, MD    Current Facility-Administered Medications  Medication Dose Route Frequency Provider Last Rate Last Admin  . cefTRIAXone (ROCEPHIN) 2 g in sodium chloride 0.9 % 100 mL IVPB  2 g Intravenous Q24H Wouk, Wilfred Curtis, MD   Stopped at 06/18/22 1651  . [START ON 06/19/2022] Chlorhexidine Gluconate Cloth 2 % PADS 6 each  6 each Topical Q0600 Kathrynn Running, MD      . Melene Muller ON 06/19/2022] furosemide (LASIX) tablet 60 mg  60 mg Oral Daily Wouk, Wilfred Curtis, MD      . lactulose (CHRONULAC) 10 GM/15ML solution 20 g  20 g Oral TID Kathrynn Running, MD   20 g at 06/18/22 1726  . midodrine (PROAMATINE) tablet 10 mg  10 mg Oral BID WC Wouk, Wilfred Curtis, MD   10 mg at 06/18/22 1727  . pantoprazole (PROTONIX) injection 40 mg  40 mg Intravenous Q12H Wouk, Wilfred Curtis, MD   40 mg at 06/18/22 1620  . rifaximin (XIFAXAN) tablet 550 mg  550 mg Oral BID Kathrynn Running, MD   550 mg at 06/18/22 1727  . spironolactone (ALDACTONE) tablet 50 mg  50 mg Oral Daily Kathrynn Running, MD   50 mg at 06/18/22 1727    Allergies as of 06/17/2022  . (No Known Allergies)    Family History  Problem Relation Age of Onset  . Gout Mother   . Hypertension Mother   . Thyroid disease Mother   . Stroke Father   . Heart attack Father   . Thyroid disease Sister    . Hypertension Brother   . Lung cancer Brother   . Colon cancer Maternal Grandmother 80  . Sarcoidosis Cousin   . Crohn's disease Neg Hx   . Esophageal cancer Neg Hx   .  Rectal cancer Neg Hx   . Stomach cancer Neg Hx     Social History   Socioeconomic History  . Marital status: Single    Spouse name: Not on file  . Number of children: Not on file  . Years of education: Not on file  . Highest education level: Not on file  Occupational History  . Not on file  Tobacco Use  . Smoking status: Former    Packs/day: 0.50    Years: 25.00    Additional pack years: 0.00    Total pack years: 12.50    Types: Cigarettes    Quit date: 04/25/2019    Years since quitting: 3.1  . Smokeless tobacco: Never  Vaping Use  . Vaping Use: Never used  Substance and Sexual Activity  . Alcohol use: Not Currently    Alcohol/week: 2.0 standard drinks of alcohol    Types: 2 Standard drinks or equivalent per week    Comment: not drank in approx. 1 year  . Drug use: Never  . Sexual activity: Not on file  Other Topics Concern  . Not on file  Social History Narrative  . Not on file   Social Determinants of Health   Financial Resource Strain: Not on file  Food Insecurity: Food Insecurity Present (06/18/2022)   Hunger Vital Sign   . Worried About Programme researcher, broadcasting/film/video in the Last Year: Often true   . Ran Out of Food in the Last Year: Often true  Transportation Needs: No Transportation Needs (06/18/2022)   PRAPARE - Transportation   . Lack of Transportation (Medical): No   . Lack of Transportation (Non-Medical): No  Physical Activity: Not on file  Stress: Not on file  Social Connections: Not on file  Intimate Partner Violence: Not At Risk (06/18/2022)   Humiliation, Afraid, Rape, and Kick questionnaire   . Fear of Current or Ex-Partner: No   . Emotionally Abused: No   . Physically Abused: No   . Sexually Abused: No    Review of Systems: Gen: Denies fever, sweats or chills. No weight loss.   CV: Denies chest pain, palpitations or edema. Resp: Denies cough, shortness of breath of hemoptysis.  GI: See HPI. GU : Denies urinary burning, blood in urine, increased urinary frequency or incontinence. MS: Denies joint pain, muscles aches or weakness. Derm: Denies rash, itchiness, skin lesions or unhealing ulcers. Psych: Denies depression, anxiety, memory loss or confusion. Heme: Denies easy bruising, bleeding. Neuro:  Denies headaches, dizziness or paresthesias. Endo:  Denies any problems with DM, thyroid or adrenal function.  Physical Exam: Vital signs in last 24 hours: Temp:  [97.9 F (36.6 C)-98.9 F (37.2 C)] 98 F (36.7 C) (04/23 1600) Pulse Rate:  [73-89] 78 (04/23 1400) Resp:  [11-30] 11 (04/23 1400) BP: (109-124)/(54-72) 115/58 (04/23 1400) SpO2:  [93 %-99 %] 98 % (04/23 1400) Weight:  [80.2 kg-83.9 kg] 80.2 kg (04/23 1308) Last BM Date :  (PTA) General: 52 year-old male in no acute distress. Head:  Normocephalic and atraumatic. Eyes: Mild scleral icterus.  Conjunctiva pink. Ears:  Normal auditory acuity. Nose:  No deformity, discharge or lesions. Mouth:  Dentition intact. No ulcers or lesions.  Neck:  Supple. No lymphadenopathy or thyromegaly.  Lungs: Breath sounds clear throughout. No wheezes, rhonchi or crackles.  Heart: Regular rate and rhythm, no murmurs. Abdomen: Soft, nondistended.  Nontender.  No ascites.  No hepatosplenomegaly.  No palpable mass. Rectal: Deferred. Musculoskeletal:  Symmetrical without gross deformities.  Pulses:  Normal  pulses noted. Extremities: No edema. Neurologic:  Alert and  oriented x 4. No focal deficits.  No asterixis. Skin:  Intact without significant lesions or rashes.  No obvious jaundice. Psych:  Alert and cooperative. Normal mood and affect.  Intake/Output from previous day: 04/22 0701 - 04/23 0700 In: 1000.6 [IV Piggyback:1000.6] Out: -  Intake/Output this shift: Total I/O In: 100 [IV Piggyback:100] Out: -   Lab  Results: Recent Labs    06/17/22 2031  WBC 8.3  HGB 10.5*  HCT 31.0*  PLT 99*   BMET Recent Labs    06/17/22 2031  NA 120*  K 4.6  CL 91*  CO2 22  GLUCOSE 130*  BUN 24*  CREATININE 1.02  CALCIUM 8.0*   LFT Recent Labs    06/17/22 2031  PROT 6.9  ALBUMIN 2.1*  AST 131*  ALT 43  ALKPHOS 131*  BILITOT 7.9*   PT/INR Recent Labs    06/18/22 1526  LABPROT 20.1*  INR 1.7*   Hepatitis Panel No results for input(s): "HEPBSAG", "HCVAB", "HEPAIGM", "HEPBIGM" in the last 72 hours.    Studies/Results: CT ABDOMEN PELVIS W CONTRAST  Result Date: 06/17/2022 CLINICAL DATA:  Acute abdominal pain. EXAM: CT ABDOMEN AND PELVIS WITH CONTRAST TECHNIQUE: Multidetector CT imaging of the abdomen and pelvis was performed using the standard protocol following bolus administration of intravenous contrast. RADIATION DOSE REDUCTION: This exam was performed according to the departmental dose-optimization program which includes automated exposure control, adjustment of the mA and/or kV according to patient size and/or use of iterative reconstruction technique. CONTRAST:  OMNIPAQUE IOHEXOL 300 MG/ML  SOLN COMPARISON:  Most recent CT 04/19/2022 FINDINGS: Lower chest: Faint peribronchial nodularity unchanged from prior exam. No acute airspace disease or pleural effusion. Hepatobiliary: Cirrhotic hepatic morphology. No evidence of focal liver lesion. Moderate gallbladder distension but no calcified gallstone. No biliary dilatation. The main portal vein is patent. Pancreas: No ductal dilatation or inflammation. Spleen: Normal in size without focal abnormality. Adrenals/Urinary Tract: Normal adrenal glands. No hydronephrosis or perinephric edema. Homogeneous renal enhancement with symmetric excretion on delayed phase imaging. Minimally distended urinary bladder, mild wall thickening. Stomach/Bowel: Diminished hiatal hernia from prior. Perigastric varices. The stomach is nondistended. There is no small  bowel obstruction or inflammation. Appendix not confidently visualized, there is no appendicitis. No colonic inflammation. 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 Mild aortic atherosclerosis. Few prominent lymph nodes in the pelvis which are stable from prior exam, largest measuring 10 mm in the right external iliac station. Reproductive: Prostate is unremarkable. Other: Trace perihepatic and pelvic ascites. Mild generalized edema of the intra-abdominal fat. No free air. Musculoskeletal: Mild chronic T9 compression deformity. There are no acute or suspicious osseous abnormalities. IMPRESSION: 1. Mild urinary bladder wall thickening, may be due to nondistention, however recommend correlation with urinalysis to exclude urinary tract infection. 2. Cirrhosis with large portosystemic varices, most prominently affecting the right lower quadrant. Trace perihepatic and pelvic ascites. Aortic Atherosclerosis (ICD10-I70.0). Electronically Signed   By: Narda Rutherford M.D.   On: 06/17/2022 23:41    IMPRESSION/PLAN:  52 year old with a history of alcohol associated decompensated cirrhosis with ascites, portal hypertension and hepatic encephalopathy. Admitted to the hospital secondary acute epigastric pain with N/V and specs of red blood in his spit. EGD 07/13/2021 identified portal hypertensive gastropathy without evidence of esophageal/gastric varices. CTAP identified cirrhosis with large portosystemic varices and trace with trace perihepatic and pelvic ascites.  Hemoglobin 10.5 ( Hg  7.2 on 04/23/2022).  Stable LFTs. Lipase mildly elevated at 70.  Normal pancreas per CT. No overt hepatic encephalopathy at this time. -Clear liquid diet -Continue Pantoprazole 40 mg IV twice daily -Continue Rocephin 2gm IV every 24 hours for SBP prophylaxis -Ondansetron 4 mg p.o. or IV every 6 hours as needed -Continue Lactulose 20 g p.o. twice  daily -Rifaximin 550 mg p.o. twice daily -Monitor renal status closely on Spironolactone and Furosemide -Consider Octreotide if he demonstrates significant active GI bleeding  -Defer endoscopic recommendations to Dr. Leone Payor -Calculate MELD 3.0 after INR results received -BMP, hepatic panel and CBC in am -Will continue follow-up at [facility] as planned  Thrombocytopenia secondary to cirrhosis, no splenomegaly per CT  Pulmonary sarcoidosis  Tubular adenomatous colon polyps, tubulovillous rectal polyp -Next colonoscopy due 06/2024  Original note by Alcide Evener, NP  ADDENDUM MELD 3.0: 27 at 06/19/2022  3:09 AM MELD-Na: 28 at 06/19/2022  3:09 AM Calculated from: Serum Creatinine: 0.83 mg/dL (Using min of 1 mg/dL) at 10/05/9145  8:29 AM Serum Sodium: 123 mmol/L (Using min of 125 mmol/L) at 06/19/2022  3:09 AM Total Bilirubin: 7.9 mg/dL at 5/62/1308  6:57 PM Serum Albumin: 2.1 g/dL at 8/46/9629  5:28 PM INR(ratio): 1.7 at 06/18/2022  3:26 PM Age at listing (hypothetical): 52 years Sex: Male at 06/19/2022  3:09 AM        [facility] Attending   I have taken an interval history, reviewed the chart and examined the patient. I agree with the Advanced Practitioner's note, impression and recommendations.    Bleeding not major but w/ epigastric pain, vomiting, cirrhosis and gastric sarcoid I think EGD appropriate. Will try to do tomorrow. Possibly at noon if that is available.   Stan Head  06/18/2022, 4:14PM

## 2022-06-18 NOTE — ED Notes (Signed)
Attempted to call report to [facility] step down unit . Nurse busy with critical patient , left message to call back when she can .

## 2022-06-18 NOTE — H&P (Signed)
History and Physical    Trevor Reilly ZOX:[id number] DOB: 1971-01-14 DOA: 06/17/2022  PCP: Sherral Hammers, FNP  Patient coming from: home by way of outside ER   Chief Complaint: hematemesis  HPI: Trevor Reilly is a 52 y.o. male with medical history significant for etoh cirrhosis, osa not on cpap, pulmonary sarcoid, htn, who presents with the above.  Reports being in his usual state of health when yesterday developed abdominal pain and one episode of emesis. There was blood streaked in the emesis. No further emesis and abd pain resolved. Did have a nosebleed yesterday that has resolved. No melena or hematochezia. No varices history   Review of Systems: As per HPI otherwise 10 point review of systems negative.    Past Medical History:  Diagnosis Date   Allergy    SEASONAL   Chronic cough    05-22-2020 per pt morning productive with yellow sputum   Cirrhosis    Depression    ED (erectile dysfunction)    Epididymal cyst    left   Fatty liver    05-22-2020  per pt has been referred to GI,  last ultrasound in epic 06-10-2016   GERD (gastroesophageal reflux disease)    History of community acquired pneumonia 04/13/2020   ED visit in epic   History of rectal bleeding    Hypertension    05-22-2020 followed by new pcp--- no medication currently   Mild obstructive sleep apnea    05-22-2020  per pt had study approx. 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30   CYSTOSCOPY  05/24/2020   Procedure: CYSTOSCOPY FLEXIBLE;  Surgeon: Crista Elliot, MD;   Location: [facility];  Service: Urology;;   EPIDIDYMECTOMY Left 05/24/2020   Procedure: LEFT EPIDIDYMAL CYST REMOVAL;  Surgeon: Crista Elliot, MD;  Location: [facility];  Service: Urology;  Laterality: Left;   IR PARACENTESIS  09/14/2021   IR PARACENTESIS  04/01/2022   IR TRANSCATHETER BX  10/03/2021   IR US GUIDE VASC ACCESS RIGHT  10/03/2021   IR VENOGRAM HEPATIC W HEMODYNAMIC EVALUATION  10/03/2021   LUNG BIOPSY  2007   IR   CT guided needle lung biopsy's and washing's      reports that he quit smoking about 3 years ago. His smoking use included cigarettes. He has a 12.50 pack-year smoking history. He has never used smokeless tobacco. He reports that he does not currently use alcohol after a past usage of about 2.0 standard drinks of alcohol per week. He reports that he does not use drugs.  No Known Allergies  Family History  Problem Relation Age of Onset   Gout Mother    Hypertension Mother    Thyroid disease Mother    Stroke Father    Heart attack Father    Thyroid disease Sister    Hypertension Brother    Lung cancer Brother    Colon cancer Maternal Grandmother 67  Sarcoidosis Cousin    Crohn's disease Neg Hx    Esophageal cancer Neg Hx    Rectal cancer Neg Hx    Stomach cancer Neg Hx     Prior to Admission medications   Medication Sig Start Date End Date Taking? Authorizing Provider  albuterol (VENTOLIN HFA) 108 (90 Base) MCG/ACT inhaler SMARTSIG:1-2 Puff(s) By Mouth Every 4-6 Hours PRN    [provider]  azithromycin (ZITHROMAX) 250 MG tablet Take 2 tablets on first day, then 1 tablet daily until finished 05/31/22   Cobb, Ruby Cola, NP  fluticasone (FLONASE) 50 MCG/ACT nasal spray Place 1 spray into both nostrils 2 (two) times daily. 01/09/22   Hunsucker, Lesia Sago, MD  furosemide (LASIX) 20 MG tablet Take 1 tablet (20 mg total) by mouth 2 (two) times daily. Patient taking differently: Take 20 mg by mouth daily. 02/08/22    Curatolo, Adam, DO  lactulose (CHRONULAC) 10 GM/15ML solution Take 30 mLs (20 g total) by mouth 2 (two) times daily. 04/23/22   Danford, Earl Lites, MD  midodrine (PROAMATINE) 5 MG tablet Take 2 tablets (10 mg total) by mouth 2 (two) times daily with a meal. 04/23/22   Danford, Earl Lites, MD  pantoprazole (PROTONIX) 40 MG tablet Take 1 tablet (40 mg total) by mouth 2 (two) times daily. Please call [phone] to schedule an office visit for more refills 05/20/22   Lynann Bologna, MD  predniSONE (DELTASONE) 10 MG tablet Take 4 tabs for 2 days, then 3 tabs for 2 days, 2 tabs for 2 days, then 1 tab for 2 days, then stop. 05/31/22   Cobb, Ruby Cola, NP  silver sulfADIAZINE (SILVADENE) 1 % cream Apply 1 Application topically 2 (two) times a week.    [provider]  spironolactone (ALDACTONE) 50 MG tablet Take 1 tablet (50 mg total) by mouth daily. 02/08/22   Curatolo, Adam, DO  XIFAXAN 550 MG TABS tablet Take 1 tablet (550 mg total) by mouth 2 (two) times daily. 04/23/22   Alberteen Sam, MD    Physical Exam: Vitals:   06/18/22 1100 06/18/22 1306 06/18/22 1308 06/18/22 1400  BP: 115/69 113/62  (!) 115/58  Pulse: 83 82  78  Resp: 16 13  11   Temp: 97.9 F (36.6 C)  98 F (36.7 C)   TempSrc:   Oral   SpO2: 98% 97%  98%  Weight:   80.2 kg   Height:   5\' 9"  (1.753 m)     Constitutional: No acute distress Head: Atraumatic Eyes: Conjunctiva clear ENM: Moist mucous membranes. Normal dentition.  Neck: Supple Respiratory: few scattered rhonchi Cardiovascular: Regular rate and rhythm. No murmurs/rubs/gallops. Abdomen: Non-tender, non-distended. No masses. No rebound or guarding. Positive bowel sounds. Musculoskeletal: No joint deformity upper and lower extremities. Normal ROM, no contractures. Normal muscle tone.  Skin: No rashes, lesions, or ulcers.  Extremities: No peripheral edema. Palpable peripheral pulses. Neurologic: Alert, moving all 4 extremities. Psychiatric:  Normal insight and judgement.   Labs on Admission: I have personally reviewed following labs and imaging studies  CBC: Recent Labs  Lab 06/17/22 2031  WBC 8.3  HGB 10.5*  HCT 31.0*  MCV 99.7  PLT 99*   Basic Metabolic Panel: Recent Labs  Lab 06/17/22 2031  NA 120*  K 4.6  CL 91*  CO2 22  GLUCOSE 130*  BUN 24*  CREATININE 1.02  CALCIUM 8.0*   GFR: Estimated Creatinine Clearance: 85.7 mL/min (by C-G formula based on SCr of 1.02 mg/dL). Liver  Function Tests: Recent Labs  Lab 06/17/22 2031  AST 131*  ALT 43  ALKPHOS 131*  BILITOT 7.9*  PROT 6.9  ALBUMIN 2.1*   Recent Labs  Lab 06/17/22 2031  LIPASE 70*   No results for input(s): "AMMONIA" in the last 168 hours. Coagulation Profile: No results for input(s): "INR", "PROTIME" in the last 168 hours. Cardiac Enzymes: No results for input(s): "CKTOTAL", "CKMB", "CKMBINDEX", "TROPONINI" in the last 168 hours. BNP (last 3 results) No results for input(s): "PROBNP" in the last 8760 hours. HbA1C: No results for input(s): "HGBA1C" in the last 72 hours. CBG: No results for input(s): "GLUCAP" in the last 168 hours. Lipid Profile: No results for input(s): "CHOL", "HDL", "LDLCALC", "TRIG", "CHOLHDL", "LDLDIRECT" in the last 72 hours. Thyroid Function Tests: No results for input(s): "TSH", "T4TOTAL", "FREET4", "T3FREE", "THYROIDAB" in the last 72 hours. Anemia Panel: No results for input(s): "VITAMINB12", "FOLATE", "FERRITIN", "TIBC", "IRON", "RETICCTPCT" in the last 72 hours. Urine analysis:    Component Value Date/Time   COLORURINE AMBER (A) 06/17/2022 2345   APPEARANCEUR CLEAR 06/17/2022 2345   LABSPEC 1.020 06/17/2022 2345   PHURINE 5.5 06/17/2022 2345   GLUCOSEU NEGATIVE 06/17/2022 2345   HGBUR NEGATIVE 06/17/2022 2345   BILIRUBINUR SMALL (A) 06/17/2022 2345   KETONESUR NEGATIVE 06/17/2022 2345   PROTEINUR NEGATIVE 06/17/2022 2345   UROBILINOGEN 1.0 01/09/2011 1625   NITRITE NEGATIVE 06/17/2022 2345    LEUKOCYTESUR NEGATIVE 06/17/2022 2345    Radiological Exams on Admission: CT ABDOMEN PELVIS W CONTRAST  Result Date: 06/17/2022 CLINICAL DATA:  Acute abdominal pain. EXAM: CT ABDOMEN AND PELVIS WITH CONTRAST TECHNIQUE: Multidetector CT imaging of the abdomen and pelvis was performed using the standard protocol following bolus administration of intravenous contrast. RADIATION DOSE REDUCTION: This exam was performed according to the departmental dose-optimization program which includes automated exposure control, adjustment of the mA and/or kV according to patient size and/or use of iterative reconstruction technique. CONTRAST:  OMNIPAQUE IOHEXOL 300 MG/ML  SOLN COMPARISON:  Most recent CT 04/19/2022 FINDINGS: Lower chest: Faint peribronchial nodularity unchanged from prior exam. No acute airspace disease or pleural effusion. Hepatobiliary: Cirrhotic hepatic morphology. No evidence of focal liver lesion. Moderate gallbladder distension but no calcified gallstone. No biliary dilatation. The main portal vein is patent. Pancreas: No ductal dilatation or inflammation. Spleen: Normal in size without focal abnormality. Adrenals/Urinary Tract: Normal adrenal glands. No hydronephrosis or perinephric edema. Homogeneous renal enhancement with symmetric excretion on delayed phase imaging. Minimally distended urinary bladder, mild wall thickening. Stomach/Bowel: Diminished hiatal hernia from prior. Perigastric varices. The stomach is nondistended. There is no small bowel obstruction or inflammation. Appendix not confidently visualized, there is no appendicitis. No colonic inflammation. 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 Mild aortic atherosclerosis. Few prominent lymph nodes in the pelvis which are stable from prior exam, largest measuring 10 mm in the right external iliac station. Reproductive: Prostate is unremarkable.  Other: Trace perihepatic and pelvic ascites. Mild generalized edema of the intra-abdominal fat. No free air. Musculoskeletal: Mild chronic T9 compression deformity. There are no acute or suspicious osseous abnormalities. IMPRESSION: 1. Mild urinary bladder wall thickening, may be due to nondistention, however recommend correlation with urinalysis to exclude urinary tract infection. 2. Cirrhosis with large portosystemic varices, most prominently affecting the right lower quadrant. Trace perihepatic and pelvic ascites. Aortic Atherosclerosis (ICD10-I70.0). Electronically Signed   By: Narda Rutherford M.D.   On: 06/17/2022 23:41     Assessment/Plan Principal Problem:  Hematemesis Active Problems:   OSA (obstructive sleep apnea)   Alcoholic cirrhosis of liver with ascites   Alcohol abuse   Hypertension    # Hematemesis One episode, small volume (blood streaks). No varices on 2023 egd but did have portal gastropathy. CT today shows nothing acute in the abdomen but large portosystemic varices, trace ascites. Hgb today improved from prior. Presently this does not strongly appear to be a variceal bleed, symptoms more consistent with mallory weiss. - [facility] will see in consult - npo for now - will start ceftriaxone 1 g qd and pantop 40 IV bid - holding on octreotide given relatively low concern for variceal bleed - f/u INR  # Cirrhosis Appears relatively compensated currently. Followed by transplant hepatology. Trace ascites on CT.  - cont home lasix, spiro, rifaximin, lactulose  # Hyponatremia 2/2 liver failure. Na 120 bit down from priors - monitor for now  # Anemia # Thrombocytopenia Hgb 10.4 much improved from recent priors. Plts 99 stable from priors - monitor  # OSA Not on cpap at home  # Hypotension - cont home midodrine  # Pulmonary sarcoid Appears quiescent  DVT prophylaxis: SCDs Code Status: full  Family Communication: none @ bedside  Consults called: GI   Level  of care: Stepdown Status is: Inpatient Remains inpatient appropriate because: need for inpatient evaluation    Silvano Bilis MD [facility] Pager [phone]  If 7PM-7AM, please contact night-coverage [email or web address] Password [facility]  06/18/2022, 2:44 PM

## 2022-06-19 DIAGNOSIS — D860 Sarcoidosis of lung: Secondary | ICD-10-CM | POA: Diagnosis present

## 2022-06-19 DIAGNOSIS — Z801 Family history of malignant neoplasm of trachea, bronchus and lung: Secondary | ICD-10-CM | POA: Diagnosis not present

## 2022-06-19 DIAGNOSIS — K7040 Alcoholic hepatic failure without coma: Secondary | ICD-10-CM | POA: Diagnosis present

## 2022-06-19 DIAGNOSIS — K226 Gastro-esophageal laceration-hemorrhage syndrome: Secondary | ICD-10-CM | POA: Diagnosis present

## 2022-06-19 DIAGNOSIS — I700 Atherosclerosis of aorta: Secondary | ICD-10-CM | POA: Diagnosis present

## 2022-06-19 DIAGNOSIS — E871 Hypo-osmolality and hyponatremia: Secondary | ICD-10-CM | POA: Diagnosis present

## 2022-06-19 DIAGNOSIS — R7989 Other specified abnormal findings of blood chemistry: Secondary | ICD-10-CM | POA: Diagnosis present

## 2022-06-19 DIAGNOSIS — E440 Moderate protein-calorie malnutrition: Secondary | ICD-10-CM | POA: Diagnosis present

## 2022-06-19 DIAGNOSIS — K219 Gastro-esophageal reflux disease without esophagitis: Secondary | ICD-10-CM | POA: Diagnosis present

## 2022-06-19 DIAGNOSIS — Z8349 Family history of other endocrine, nutritional and metabolic diseases: Secondary | ICD-10-CM | POA: Diagnosis not present

## 2022-06-19 DIAGNOSIS — I10 Essential (primary) hypertension: Secondary | ICD-10-CM

## 2022-06-19 DIAGNOSIS — Z8249 Family history of ischemic heart disease and other diseases of the circulatory system: Secondary | ICD-10-CM | POA: Diagnosis not present

## 2022-06-19 DIAGNOSIS — D689 Coagulation defect, unspecified: Secondary | ICD-10-CM

## 2022-06-19 DIAGNOSIS — K440 Diaphragmatic hernia with obstruction, without gangrene: Secondary | ICD-10-CM | POA: Diagnosis not present

## 2022-06-19 DIAGNOSIS — K922 Gastrointestinal hemorrhage, unspecified: Secondary | ICD-10-CM | POA: Diagnosis not present

## 2022-06-19 DIAGNOSIS — K7031 Alcoholic cirrhosis of liver with ascites: Secondary | ICD-10-CM

## 2022-06-19 DIAGNOSIS — Z79899 Other long term (current) drug therapy: Secondary | ICD-10-CM | POA: Diagnosis not present

## 2022-06-19 DIAGNOSIS — D638 Anemia in other chronic diseases classified elsewhere: Secondary | ICD-10-CM | POA: Diagnosis present

## 2022-06-19 DIAGNOSIS — G4733 Obstructive sleep apnea (adult) (pediatric): Secondary | ICD-10-CM

## 2022-06-19 DIAGNOSIS — Z87891 Personal history of nicotine dependence: Secondary | ICD-10-CM | POA: Diagnosis not present

## 2022-06-19 DIAGNOSIS — D684 Acquired coagulation factor deficiency: Secondary | ICD-10-CM | POA: Diagnosis present

## 2022-06-19 DIAGNOSIS — F32A Depression, unspecified: Secondary | ICD-10-CM | POA: Diagnosis present

## 2022-06-19 DIAGNOSIS — F1010 Alcohol abuse, uncomplicated: Secondary | ICD-10-CM | POA: Diagnosis present

## 2022-06-19 DIAGNOSIS — Z823 Family history of stroke: Secondary | ICD-10-CM | POA: Diagnosis not present

## 2022-06-19 DIAGNOSIS — D6959 Other secondary thrombocytopenia: Secondary | ICD-10-CM | POA: Diagnosis present

## 2022-06-19 DIAGNOSIS — I959 Hypotension, unspecified: Secondary | ICD-10-CM | POA: Diagnosis present

## 2022-06-19 DIAGNOSIS — Y92239 Unspecified place in hospital as the place of occurrence of the external cause: Secondary | ICD-10-CM | POA: Diagnosis present

## 2022-06-19 DIAGNOSIS — Z800 Family history of malignant neoplasm of digestive organs: Secondary | ICD-10-CM | POA: Diagnosis not present

## 2022-06-19 DIAGNOSIS — K920 Hematemesis: Secondary | ICD-10-CM | POA: Diagnosis present

## 2022-06-19 LAB — CBC
HCT: 29.2 % — ABNORMAL LOW (ref 39.0–52.0)
Hemoglobin: 9.4 g/dL — ABNORMAL LOW (ref 13.0–17.0)
MCH: 34.3 pg — ABNORMAL HIGH (ref 26.0–34.0)
MCHC: 32.2 g/dL (ref 30.0–36.0)
MCV: 106.6 fL — ABNORMAL HIGH (ref 80.0–100.0)
Platelets: 90 10*3/uL — ABNORMAL LOW (ref 150–400)
RBC: 2.74 MIL/uL — ABNORMAL LOW (ref 4.22–5.81)
RDW: 17.5 % — ABNORMAL HIGH (ref 11.5–15.5)
WBC: 8 10*3/uL (ref 4.0–10.5)
nRBC: 0 % (ref 0.0–0.2)

## 2022-06-19 LAB — BASIC METABOLIC PANEL
Anion gap: 6 (ref 5–15)
BUN: 18 mg/dL (ref 6–20)
CO2: 20 mmol/L — ABNORMAL LOW (ref 22–32)
Calcium: 8.1 mg/dL — ABNORMAL LOW (ref 8.9–10.3)
Chloride: 97 mmol/L — ABNORMAL LOW (ref 98–111)
Creatinine, Ser: 0.83 mg/dL (ref 0.61–1.24)
GFR, Estimated: >60 mL/min (ref >60)
Glucose, Bld: 90 mg/dL (ref 70–99)
Potassium: 4.8 mmol/L (ref 3.5–5.1)
Sodium: 123 mmol/L — ABNORMAL LOW (ref 135–145)

## 2022-06-19 LAB — OSMOLALITY: Osmolality: 270 mOsm/kg — ABNORMAL LOW (ref 275–295)

## 2022-06-19 LAB — SODIUM, URINE, RANDOM: Sodium, Ur: 10 mmol/L

## 2022-06-19 LAB — OSMOLALITY, URINE: Osmolality, Ur: 109 mOsm/kg — ABNORMAL LOW (ref 300–900)

## 2022-06-19 MED ORDER — SODIUM CHLORIDE 0.9 % IV BOLUS
250.0000 mL | Freq: Once | INTRAVENOUS | Status: AC
  Administered 2022-06-19: 250 mL via INTRAVENOUS

## 2022-06-19 NOTE — H&P (View-Only) (Signed)
   Unable to scope patient today - will do tomorrow AM.  I changed diet. Iva Boop, MD, Clement[facility] Graham

## 2022-06-19 NOTE — TOC CM/SW Note (Signed)
  Transition of Care [facility]) Screening Note   Patient Details  Name: Trevor Reilly Date of Birth: 1970/10/27   [facility]) CM/SW Contact:    Amada Jupiter, LCSW Phone Number: 06/19/2022, 3:31 PM    Transition of Care Department [facility]) has reviewed patient and no TOC needs have been identified at this time. We will continue to monitor patient advancement through interdisciplinary progression rounds. If new patient transition needs arise, please place a TOC consult.

## 2022-06-19 NOTE — Progress Notes (Signed)
   Unable to scope patient today - will do tomorrow AM.  I changed diet. Armandina Iman E. Trafton Roker, MD, FACG    

## 2022-06-19 NOTE — Anesthesia Preprocedure Evaluation (Addendum)
Anesthesia Evaluation  Patient identified by MRN, date of birth, ID band Patient awake    Reviewed: Allergy & Precautions, NPO status , Patient's Chart, lab work & pertinent test results  History of Anesthesia Complications Negative for: history of anesthetic complications  Airway Mallampati: II  TM Distance: >3 FB Neck ROM: Full    Dental no notable dental hx. (+) Dental Advisory Given, Missing, Poor Dentition   Pulmonary sleep apnea (no longer requires CPAP) , COPD,  COPD inhaler, former smoker 05/22/2020 SARS coronavirus NEG Sarcoidosis   Pulmonary exam normal breath sounds clear to auscultation       Cardiovascular hypertension, Normal cardiovascular exam Rhythm:Regular Rate:Normal     Neuro/Psych    Depression    negative neurological ROS     GI/Hepatic ,GERD  Controlled,,(+) Cirrhosis       Lab Results      Component                Value               Date                      ALT                      38                  06/20/2022                AST                      111 (H)             06/20/2022                ALKPHOS                  110                 06/20/2022                BILITOT                  7.5 (H)             06/20/2022              Endo/Other  negative endocrine ROS  obese  Renal/GU Renal diseaseLab Results      Component                Value               Date                      CREATININE               0.91                06/20/2022                BUN                      11                  06/20/2022                NA  124 (L)             06/20/2022                K                        4.6                 06/20/2022                CL                       97 (L)              06/20/2022                CO2                      20 (L)              06/20/2022                Musculoskeletal   Abdominal  (+) + obese  Peds  Hematology negative hematology  ROS (+) Lab Results      Component                Value               Date                      WBC                      5.4                 06/20/2022                HGB                      10.7 (L)            06/20/2022                HCT                      31.9 (L)            06/20/2022                MCV                      102.6 (H)           06/20/2022                PLT                      98 (L)              06/20/2022              Anesthesia Other Findings   Reproductive/Obstetrics                             Anesthesia Physical Anesthesia Plan  ASA: 4  Anesthesia Plan: MAC   Post-op Pain Management:    Induction: Intravenous  PONV Risk Score and Plan: 2 and Ondansetron and Dexamethasone  Airway Management Planned:  Additional Equipment: None  Intra-op Plan:   Post-operative Plan:   Informed Consent: I have reviewed the patients History and Physical, chart, labs and discussed the procedure including the risks, benefits and alternatives for the proposed anesthesia with the patient or authorized representative who has indicated his/her understanding and acceptance.     Dental advisory given  Plan Discussed with: CRNA and Surgeon  Anesthesia Plan Comments:         Anesthesia Quick Evaluation

## 2022-06-19 NOTE — Progress Notes (Addendum)
[facility] Progress Note   CC: Hematemesis, GI bleed, cirrhosis  Subjective: No further N/V or spitting up blood.  No abdominal pain.  He passed 3 soft brown stools overnight.  No no bloody or black stools.  No chest pain or shortness of breath.  He remains NPO.  Objective:  Vital signs in last 24 hours: Temp:  [97.6 F (36.4 C)-98.4 F (36.9 C)] 98.4 F (36.9 C) (04/24 0330) Pulse Rate:  [72-95] 72 (04/24 0700) Resp:  [11-24] 12 (04/24 0700) BP: (80-144)/(26-98) 105/49 (04/24 0700) SpO2:  [94 %-100 %] 94 % (04/24 0700) Weight:  [80.2 kg] 80.2 kg (04/23 1308) Last BM Date : 06/18/22 General: Alert 52 year old male in no acute distress. Heart: Regular rate and rhythm, no murmurs. Pulm: Breath sounds clear throughout. Abdomen: Soft, nondistended.  Nontender.  No ascites.  No hepatosplenomegaly.  No palpable mass.  Positive bowel sounds to all 4 quadrants. Extremities:  Without edema. Neurologic:  Alert and  oriented x 4.  Speech is clear.  Moves all extremities.  Hands slightly tremulous without asterixis. Psych:  Alert and cooperative. Normal mood and affect.  Intake/Output from previous day: 04/23 0701 - 04/24 0700 In: 211.8 [IV Piggyback:211.8] Out: 250 [Urine:250] Intake/Output this shift: No intake/output data recorded.  Lab Results: Recent Labs    06/17/22 2031 06/19/22 0309  WBC 8.3 8.0  HGB 10.5* 9.4*  HCT 31.0* 29.2*  PLT 99* 90*   BMET Recent Labs    06/17/22 2031 06/19/22 0309  NA 120* 123*  K 4.6 4.8  CL 91* 97*  CO2 22 20*  GLUCOSE 130* 90  BUN 24* 18  CREATININE 1.02 0.83  CALCIUM 8.0* 8.1*   LFT Recent Labs    06/17/22 2031  PROT 6.9  ALBUMIN 2.1*  AST 131*  ALT 43  ALKPHOS 131*  BILITOT 7.9*   PT/INR Recent Labs    06/18/22 1526  LABPROT 20.1*  INR 1.7*   Hepatitis Panel No results for input(s): "HEPBSAG", "HCVAB", "HEPAIGM", "HEPBIGM" in the last 72 hours.  CT ABDOMEN PELVIS W CONTRAST  Result Date:  06/17/2022 CLINICAL DATA:  Acute abdominal pain. EXAM: CT ABDOMEN AND PELVIS WITH CONTRAST TECHNIQUE: Multidetector CT imaging of the abdomen and pelvis was performed using the standard protocol following bolus administration of intravenous contrast. RADIATION DOSE REDUCTION: This exam was performed according to the departmental dose-optimization program which includes automated exposure control, adjustment of the mA and/or kV according to patient size and/or use of iterative reconstruction technique. CONTRAST:  OMNIPAQUE IOHEXOL 300 MG/ML  SOLN COMPARISON:  Most recent CT 04/19/2022 FINDINGS: Lower chest: Faint peribronchial nodularity unchanged from prior exam. No acute airspace disease or pleural effusion. Hepatobiliary: Cirrhotic hepatic morphology. No evidence of focal liver lesion. Moderate gallbladder distension but no calcified gallstone. No biliary dilatation. The main portal vein is patent. Pancreas: No ductal dilatation or inflammation. Spleen: Normal in size without focal abnormality. Adrenals/Urinary Tract: Normal adrenal glands. No hydronephrosis or perinephric edema. Homogeneous renal enhancement with symmetric excretion on delayed phase imaging. Minimally distended urinary bladder, mild wall thickening. Stomach/Bowel: Diminished hiatal hernia from prior. Perigastric varices. The stomach is nondistended. There is no small bowel obstruction or inflammation. Appendix not confidently visualized, there is no appendicitis. No colonic inflammation. 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 Mild aortic atherosclerosis. Few prominent lymph nodes in the pelvis which are stable from prior exam, largest measuring 10 mm in the right external  iliac station. Reproductive: Prostate is unremarkable. Other: Trace perihepatic and pelvic ascites. Mild generalized edema of the intra-abdominal fat. No free air.  Musculoskeletal: Mild chronic T9 compression deformity. There are no acute or suspicious osseous abnormalities. IMPRESSION: 1. Mild urinary bladder wall thickening, may be due to nondistention, however recommend correlation with urinalysis to exclude urinary tract infection. 2. Cirrhosis with large portosystemic varices, most prominently affecting the right lower quadrant. Trace perihepatic and pelvic ascites. Aortic Atherosclerosis (ICD10-I70.0). Electronically Signed   By: Narda Rutherford M.D.   On: 06/17/2022 23:41    Assessment / Plan:  52 year old with a history of alcohol associated decompensated cirrhosis with ascites, portal hypertension and hepatic encephalopathy. MELD 3.0 = 27. Admitted to the hospital secondary acute epigastric pain, N/V with specs of red blood in his spit and anemia. EGD 07/13/2021 identified portal hypertensive gastropathy without evidence of esophageal/gastric varices. CTAP identified cirrhosis with large portosystemic varices and trace with trace perihepatic and pelvic ascites.  Hemoglobin 10.5 ( Hg 7.2 on 04/23/2022) -> today Hg 9.4. MCV 106.6. Stable LFTs. Lipase mildly elevated at 70.  Normal pancreas per CT.  Normal renal function. No overt hepatic encephalopathy at this time. -Full liquid diet today then NPO aftermidinght -EGD tomorrow, benefits and risks discussed including risk with sedation, risk of bleeding, perforation and infection  -Continue Pantoprazole 40 mg IV twice daily -Continue Rocephin 2gm IV every 24 hours for SBP prophylaxis -Ondansetron 4 mg p.o. or IV every 6 hours as needed -Continue Lactulose 20 g p.o. twice daily titrate to no more than 3-4 loose bowel movements daily -Rifaximin 550 mg p.o. twice daily -Monitor renal status/electrolytes closely on Spironolactone and Furosemide -Consider Octreotide if he demonstrates significant active GI bleeding  -CBC, PT/INR, BMP, B12 level in am -Will continue follow-[facility] as planned  Hyponatremia secondary to diuretics and cirrhosis -Defer diuretic recommendations to Dr. Leone Payor   Thrombocytopenia secondary to cirrhosis, no splenomegaly per CT  Coagulopathy secondary to cirrhosis. INR 1.7 on 4/23. 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 I agree with the Advanced Practitioner's note, impression and recommendations.    Iva Boop, MD, [facility] See Loretha Stapler on call - gastroenterology for best contact person 06/19/2022 5:18 PM

## 2022-06-19 NOTE — Progress Notes (Signed)
PROGRESS NOTE  Trevor Reilly:[id number] DOB: 03/30/1970 DOA: 06/17/2022 PCP: Sherral Hammers, FNP  HPI/Recap of past 24 hours: Trevor Reilly is a 52 y.o. male with medical history significant for etoh cirrhosis, osa not on cpap, pulmonary sarcoid, htn, who presents with small amount of hematemesis, 1 episode. Reports being in his usual state of health when PTA developed abdominal pain and one episode of emesis. There was blood streaked in the emesis. No further emesis and abd pain resolved. No melena or hematochezia. No varices history.  Patient admitted for further management.    Denies any further hematemesis, melena, abdominal pain, nausea/vomiting, fever/chills.  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, trace ascites [facility], consulted, plan for EGD on 2/25 Continue ceftriaxone 1 g qd and pantop 40 IV bid Npo @ midnight   Alcoholic Cirrhosis Elevated LFTs, t.bili AAO X 4 Appears relatively compensated currently GI on board, appreciate recs Followed by transplant hepatology at [facility] lasix, spiro, due to worsening hyponatremia Continue rifaximin, lactulose   Hyponatremia Chronic likely 2/2 liver cirrhosis, appears to be worse than his baseline Urine studies ordered Held diurteics Daily CMP   Anemia of chronic disease Chronic thrombocytopenia Likely from liver cirrhosis Hgb and PLT stable from baseline   OSA Not on cpap at home   Hypotension Improved Cont home midodrine   Pulmonary sarcoid Appears stable    Estimated body mass index is 26.11 kg/m as calculated from the following:   Height as of this encounter:   (1.753 m).   Weight as of this encounter: 80.2 kg.     Code Status: Full  Family Communication: None at bedside  Disposition Plan: Status is: Inpatient Remains inpatient appropriate because: Level of care      Consultants: GI  Procedures: None  Antimicrobials: Ceftriaxone  DVT prophylaxis: SCDs   Objective: Vitals:   06/19/22 0630 06/19/22 0700 06/19/22 0800 06/19/22 1200  BP: (!) 104/55 (!) 105/49 (!) 103/52   Pulse: 78 72 77   Resp: Temp:   98.4 F (36.9 C) 97.9 F (36.6 C)  TempSrc:   Oral Oral  SpO2: 96% 94% 96%   Weight:      Height:        Intake/Output Summary (Last 24 hours) at 06/19/2022 1435 Last data filed at 06/19/2022 0818 Gross per 24 hour  Intake 211.84 ml  Output 650 ml  Net -438.16 ml   Filed Weights   06/17/22 2023 06/18/22 1308  Weight: 83.9 kg 80.2 kg    Exam: General: NAD, dry skin Cardiovascular: S1, S2 present Respiratory: CTAB Abdomen: Soft, nontender, nondistended, bowel sounds present Musculoskeletal: No bilateral pedal edema noted Skin: Dry Psychiatry: Normal mood     Data Reviewed: CBC: Recent Labs  Lab 06/17/22 2031 06/19/22 0309  WBC 8.3 8.0  HGB 10.5* 9.4*  HCT 31.0* 29.2*  MCV 99.7 106.6*  PLT 99* 90*   Basic Metabolic Panel: Recent Labs  Lab 06/17/22 2031 06/19/22 0309  NA 120* 123*  K 4.6 4.8  CL 91* 97*  CO2 22 20*  GLUCOSE 130* 90  BUN 24* 18  CREATININE 1.02 0.83  CALCIUM 8.0* 8.1*   GFR: Estimated Creatinine Clearance: 105.3 mL/min (by C-G formula based on SCr of 0.83 mg/dL). Liver Function Tests: Recent Labs  Lab 06/17/22 2031  AST 131*  ALT 43  ALKPHOS 131*  BILITOT 7.9*  PROT 6.9  ALBUMIN 2.1*   Recent Labs  Lab 06/17/22 2031  LIPASE 70*   No results for input(s): "AMMONIA" in the last 168 hours. Coagulation Profile: Recent Labs  Lab 06/18/22 1526  INR 1.7*   Cardiac Enzymes: No results for input(s): "CKTOTAL", "CKMB", "CKMBINDEX", "TROPONINI"  in the last 168 hours. BNP (last 3 results) No results for input(s): "PROBNP" in the last 8760 hours. HbA1C: No results for input(s): "HGBA1C" in the last 72 hours. CBG: No results for input(s): "GLUCAP" in the last 168 hours. Lipid Profile: No results for input(s): "CHOL", "HDL", "LDLCALC", "TRIG", "CHOLHDL", "LDLDIRECT" in the last 72 hours. Thyroid Function Tests: No results for input(s): "TSH", "T4TOTAL", "FREET4", "T3FREE", "THYROIDAB" in the last 72 hours. Anemia Panel: No results for input(s): "VITAMINB12", "FOLATE", "FERRITIN", "TIBC", "IRON", "RETICCTPCT" in the last 72 hours. Urine analysis:    Component Value Date/Time   COLORURINE AMBER (A) 06/17/2022 2345   APPEARANCEUR CLEAR 06/17/2022 2345   LABSPEC 1.020 06/17/2022 2345   PHURINE 5.5 06/17/2022 2345   GLUCOSEU NEGATIVE 06/17/2022 2345   HGBUR NEGATIVE 06/17/2022 2345   BILIRUBINUR SMALL (A) 06/17/2022 2345   KETONESUR NEGATIVE 06/17/2022 2345   PROTEINUR NEGATIVE 06/17/2022 2345   UROBILINOGEN 1.0 01/09/2011 1625   NITRITE NEGATIVE 06/17/2022 2345   LEUKOCYTESUR NEGATIVE 06/17/2022 2345   Sepsis Labs: (procalcitonin:4,lacticidven:4)  ) Recent Results (from the past 240 hour(s))  MRSA Next Gen by PCR, Nasal     Status: None   Collection Time: 06/18/22 12:59 PM   Specimen: Nasal Mucosa; Nasal Swab  Result Value Ref Range Status   MRSA by PCR Next Gen NOT DETECTED NOT DETECTED Final    Comment: (NOTE) The GeneXpert MRSA Assay (FDA approved for NASAL specimens only), is one component of a comprehensive MRSA colonization surveillance program. It is not intended to diagnose MRSA infection nor to guide or monitor treatment for MRSA infections. Test performance is not FDA approved in patients less than 88 years old. Performed at [facility], [address] [place], [place] [address]       Studies: No results found.  Scheduled Meds:  Chlorhexidine Gluconate Cloth  6 each  Topical Daily   lactulose  20 g Oral TID   midodrine  10 mg Oral BID WC   pantoprazole (PROTONIX) IV  40 mg Intravenous Q12H   rifaximin  550 mg Oral BID    Continuous Infusions:  cefTRIAXone (ROCEPHIN)  IV Stopped (06/18/22 1651)     LOS: 1 day     Briant Cedar, MD [facility]  If 7PM-7AM, please contact night-coverage [email or web address] 06/19/2022, 2:35 PM

## 2022-06-20 ENCOUNTER — Encounter (HOSPITAL_COMMUNITY)
Admission: EM | Disposition: A | Discharge status: Home / Self Care | Payer: Self-pay | Source: Home / Self Care | Attending: Internal Medicine

## 2022-06-20 ENCOUNTER — Inpatient Hospital Stay (HOSPITAL_COMMUNITY): Payer: BC Managed Care – PPO | Admitting: Certified Registered"

## 2022-06-20 ENCOUNTER — Encounter (HOSPITAL_COMMUNITY): Payer: Self-pay | Admitting: Internal Medicine

## 2022-06-20 DIAGNOSIS — K920 Hematemesis: Secondary | ICD-10-CM | POA: Diagnosis not present

## 2022-06-20 DIAGNOSIS — K440 Diaphragmatic hernia with obstruction, without gangrene: Secondary | ICD-10-CM

## 2022-06-20 DIAGNOSIS — K7031 Alcoholic cirrhosis of liver with ascites: Secondary | ICD-10-CM | POA: Diagnosis not present

## 2022-06-20 DIAGNOSIS — E440 Moderate protein-calorie malnutrition: Secondary | ICD-10-CM | POA: Insufficient documentation

## 2022-06-20 HISTORY — PX: ESOPHAGOGASTRODUODENOSCOPY (EGD) WITH PROPOFOL: SHX5813

## 2022-06-20 LAB — COMPREHENSIVE METABOLIC PANEL
ALT: 38 U/L (ref 0–44)
AST: 111 U/L — ABNORMAL HIGH (ref 15–41)
Albumin: 2 g/dL — ABNORMAL LOW (ref 3.5–5.0)
Alkaline Phosphatase: 110 U/L (ref 38–126)
Anion gap: 7 (ref 5–15)
BUN: 11 mg/dL (ref 6–20)
CO2: 20 mmol/L — ABNORMAL LOW (ref 22–32)
Calcium: 8.1 mg/dL — ABNORMAL LOW (ref 8.9–10.3)
Chloride: 97 mmol/L — ABNORMAL LOW (ref 98–111)
Creatinine, Ser: 0.91 mg/dL (ref 0.61–1.24)
GFR, Estimated: >60 mL/min (ref >60)
Glucose, Bld: 98 mg/dL (ref 70–99)
Potassium: 4.6 mmol/L (ref 3.5–5.1)
Sodium: 124 mmol/L — ABNORMAL LOW (ref 135–145)
Total Bilirubin: 7.5 mg/dL — ABNORMAL HIGH (ref 0.3–1.2)
Total Protein: 6.2 g/dL — ABNORMAL LOW (ref 6.5–8.1)

## 2022-06-20 LAB — CBC WITH DIFFERENTIAL/PLATELET
Abs Immature Granulocytes: 0.05 10*3/uL (ref 0.00–0.07)
Basophils Absolute: 0.1 10*3/uL (ref 0.0–0.1)
Basophils Relative: 2 %
Eosinophils Absolute: 0.4 10*3/uL (ref 0.0–0.5)
Eosinophils Relative: 8 %
HCT: 31.9 % — ABNORMAL LOW (ref 39.0–52.0)
Hemoglobin: 10.7 g/dL — ABNORMAL LOW (ref 13.0–17.0)
Immature Granulocytes: 1 %
Lymphocytes Relative: 25 %
Lymphs Abs: 1.4 10*3/uL (ref 0.7–4.0)
MCH: 34.4 pg — ABNORMAL HIGH (ref 26.0–34.0)
MCHC: 33.5 g/dL (ref 30.0–36.0)
MCV: 102.6 fL — ABNORMAL HIGH (ref 80.0–100.0)
Monocytes Absolute: 0.5 10*3/uL (ref 0.1–1.0)
Monocytes Relative: 9 %
Neutro Abs: 3 10*3/uL (ref 1.7–7.7)
Neutrophils Relative %: 55 %
Platelets: 98 10*3/uL — ABNORMAL LOW (ref 150–400)
RBC: 3.11 MIL/uL — ABNORMAL LOW (ref 4.22–5.81)
RDW: 17.5 % — ABNORMAL HIGH (ref 11.5–15.5)
WBC: 5.4 10*3/uL (ref 4.0–10.5)
nRBC: 0 % (ref 0.0–0.2)

## 2022-06-20 LAB — LIPASE, BLOOD: Lipase: 50 U/L (ref 11–51)

## 2022-06-20 LAB — PROTIME-INR
INR: 1.9 — ABNORMAL HIGH (ref 0.8–1.2)
Prothrombin Time: 21.2 seconds — ABNORMAL HIGH (ref 11.4–15.2)

## 2022-06-20 SURGERY — ESOPHAGOGASTRODUODENOSCOPY (EGD) WITH PROPOFOL
Anesthesia: Monitor Anesthesia Care

## 2022-06-20 MED ORDER — LACTATED RINGERS IV SOLN: INTRAVENOUS | Status: DC

## 2022-06-20 MED ORDER — PANTOPRAZOLE SODIUM 40 MG PO TBEC
40.0000 mg | DELAYED_RELEASE_TABLET | Freq: Every day | ORAL | Status: DC
  Administered 2022-06-21: 40 mg via ORAL
  Filled 2022-06-20: qty 1

## 2022-06-20 MED ORDER — SODIUM CHLORIDE 0.9 % IV SOLN: INTRAVENOUS | Status: DC

## 2022-06-20 MED ORDER — LACTATED RINGERS IV SOLN: INTRAVENOUS | Status: DC | PRN

## 2022-06-20 MED ORDER — ADULT MULTIVITAMIN W/MINERALS CH
1.0000 | ORAL_TABLET | Freq: Every day | ORAL | Status: DC
  Administered 2022-06-21: 1 via ORAL
  Filled 2022-06-20: qty 1

## 2022-06-20 MED ORDER — PROPOFOL 500 MG/50ML IV EMUL
INTRAVENOUS | Status: DC | PRN
  Administered 2022-06-20: 125 ug/kg/min via INTRAVENOUS

## 2022-06-20 MED ORDER — ONDANSETRON HCL 4 MG/2ML IJ SOLN
4.0000 mg | Freq: Four times a day (QID) | INTRAMUSCULAR | Status: DC | PRN
  Administered 2022-06-20: 4 mg via INTRAVENOUS
  Filled 2022-06-20: qty 2

## 2022-06-20 MED ORDER — ENSURE ENLIVE PO LIQD
237.0000 mL | Freq: Two times a day (BID) | ORAL | Status: DC
  Administered 2022-06-20 – 2022-06-21 (×2): 237 mL via ORAL

## 2022-06-20 MED ORDER — PROPOFOL 10 MG/ML IV BOLUS
INTRAVENOUS | Status: DC | PRN
  Administered 2022-06-20: 20 mg via INTRAVENOUS
  Administered 2022-06-20: 30 mg via INTRAVENOUS
  Administered 2022-06-20: 20 mg via INTRAVENOUS
  Administered 2022-06-20 (×2): 30 mg via INTRAVENOUS

## 2022-06-20 SURGICAL SUPPLY — 15 items

## 2022-06-20 NOTE — Progress Notes (Signed)
PROGRESS NOTE  HERBERT MARKEN ZOX:[id number] DOB: [birth year] DOA: 06/17/2022 PCP: Sherral Hammers, FNP  HPI/Recap of past 24 hours: PADRAIG NHAN is a 52 y.o. male with medical history significant for etoh cirrhosis, osa not on cpap, pulmonary sarcoid, htn, who presents with small amount of hematemesis, 1 episode. Reports being in his usual state of health when PTA developed abdominal pain and one episode of emesis. There was blood streaked in the emesis. No further emesis and abd pain resolved. No melena or hematochezia. No varices history.  Patient admitted for further management.    Today, patient reported overnight having some abdominal pain with some nausea.  Otherwise denies any new complaints this morning.   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, trace ascites [facility], consulted, s/p EGD on 4/25 showed no varices and no signs of ulcer or gastritis Continue ceftriaxone 1 g qd  Continue PPI   Alcoholic Cirrhosis Elevated LFTs, t.bili AAO X 4 Appears relatively compensated currently GI on board, appreciate recs Followed by transplant hepatology at [facility] lasix, spiro, due to worsening hyponatremia Continue rifaximin, lactulose   Hyponatremia Chronic likely 2/2 liver cirrhosis, appears to be worse than his baseline Urine studies showed plasma osmolality 270, urine osmolality 109, urine sodium 10 Held diurteics Start gentle IV hydration If hyponatremia persistent, will involve nephrology Daily CMP   Anemia of chronic disease Chronic thrombocytopenia Likely from liver cirrhosis Hgb and  PLT stable from baseline   OSA Not on cpap at home   Hypotension Gentle IV hydration Cont home midodrine   Pulmonary sarcoid Appears stable    Estimated body mass index is 26.11 kg/m as calculated from the following:   Height as of this encounter: 5\' 9"  (1.753 m).   Weight as of this encounter: 80.2 kg.     Code Status: Full  Family Communication: None at bedside  Disposition Plan: Status is: Inpatient Remains inpatient appropriate because: Level of care      Consultants: GI  Procedures: EGD on 4/25  Antimicrobials: Ceftriaxone  DVT prophylaxis: SCDs   Objective: Vitals:   06/20/22 1150 06/20/22 1158 06/20/22 1200 06/20/22 1324  BP: (!) 111/53 (!) 111/53  122/69  Pulse: 73 74  72  Resp: 14 13  20   Temp:   97.7 F (36.5 C) 97.6 F (36.4 C)  TempSrc:   Oral Oral  SpO2: 92% 94%  96%  Weight:      Height:        Intake/Output Summary (Last 24 hours) at 06/20/2022 1455 Last data filed at 06/20/2022 1138 Gross per 24 hour  Intake 900 ml  Output 1750 ml  Net -850 ml   Filed Weights   06/17/22 2023 06/18/22 1308  Weight: 83.9 kg 80.2 kg    Exam: General: NAD Cardiovascular: S1, S2 present Respiratory: CTAB Abdomen: Soft, nontender, nondistended, bowel sounds present Musculoskeletal: No bilateral pedal edema noted Skin: Dry Psychiatry: Normal mood     Data Reviewed: CBC: Recent Labs  Lab 06/17/22 2031 06/19/22 0309 06/20/22 0315  WBC 8.3 8.0 5.4  NEUTROABS  --   --  3.0  HGB 10.5* 9.4* 10.7*  HCT 31.0* 29.2* 31.9*  MCV 99.7 106.6* 102.6*  PLT 99* 90* 98*   Basic Metabolic Panel: Recent Labs  Lab 06/17/22 2031 06/19/22 0309 06/20/22 0315  NA 120* 123* 124*  K 4.6 4.8 4.6  CL 91* 97* 97*  CO2 22 20* 20*  GLUCOSE 130* 90 98  BUN 24* 18 11  CREATININE 1.02 0.83 0.91  CALCIUM 8.0* 8.1* 8.1*   GFR: Estimated Creatinine Clearance: 96 mL/min (by C-G formula based on SCr of 0.91 mg/dL). Liver Function Tests: Recent Labs   Lab 06/17/22 2031 06/20/22 0315  AST 131* 111*  ALT 43 38  ALKPHOS 131* 110  BILITOT 7.9* 7.5*  PROT 6.9 6.2*  ALBUMIN 2.1* 2.0*   Recent Labs  Lab 06/17/22 2031 06/20/22 0315  LIPASE 70* 50   No results for input(s): "AMMONIA" in the last 168 hours. Coagulation Profile: Recent Labs  Lab 06/18/22 1526 06/20/22 0315  INR 1.7* 1.9*   Cardiac Enzymes: No results for input(s): "CKTOTAL", "CKMB", "CKMBINDEX", "TROPONINI" in the last 168 hours. BNP (last 3 results) No results for input(s): "PROBNP" in the last 8760 hours. HbA1C: No results for input(s): "HGBA1C" in the last 72 hours. CBG: No results for input(s): "GLUCAP" in the last 168 hours. Lipid Profile: No results for input(s): "CHOL", "HDL", "LDLCALC", "TRIG", "CHOLHDL", "LDLDIRECT" in the last 72 hours. Thyroid Function Tests: No results for input(s): "TSH", "T4TOTAL", "FREET4", "T3FREE", "THYROIDAB" in the last 72 hours. Anemia Panel: No results for input(s): "VITAMINB12", "FOLATE", "FERRITIN", "TIBC", "IRON", "RETICCTPCT" in the last 72 hours. Urine analysis:    Component Value Date/Time   COLORURINE AMBER (A) 06/17/2022 2345   APPEARANCEUR CLEAR 06/17/2022 2345   LABSPEC 1.020 06/17/2022 2345   PHURINE 5.5 06/17/2022 2345   GLUCOSEU NEGATIVE 06/17/2022 2345   HGBUR NEGATIVE 06/17/2022 2345   BILIRUBINUR SMALL (A) 06/17/2022 2345   KETONESUR NEGATIVE 06/17/2022 2345   PROTEINUR NEGATIVE 06/17/2022 2345   UROBILINOGEN 1.0 01/09/2011 1625   NITRITE NEGATIVE 06/17/2022 2345   LEUKOCYTESUR NEGATIVE 06/17/2022 2345   Sepsis Labs: (procalcitonin:4,lacticidven:4)  ) Recent Results (from the past 240 hour(s))  MRSA Next Gen by PCR, Nasal     Status: None   Collection Time: 06/18/22 12:59 PM   Specimen: Nasal Mucosa; Nasal Swab  Result Value Ref Range Status   MRSA by PCR Next Gen NOT DETECTED NOT DETECTED Final    Comment: (NOTE) The GeneXpert MRSA Assay (FDA approved for NASAL specimens  only), is one component of a comprehensive MRSA colonization surveillance program. It is not intended to diagnose MRSA infection nor to guide or monitor treatment for MRSA infections. Test performance is not FDA approved in patients less than 82 years old. Performed at [facility], [address], [place], [place] [address]       Studies: No results found.  Scheduled Meds:  Chlorhexidine Gluconate Cloth  6 each Topical Daily   feeding supplement  237 mL Oral BID BM   lactulose  20 g Oral TID   midodrine  10 mg Oral BID WC   [START ON 06/21/2022] multivitamin with minerals  1 tablet Oral Daily   [START ON 06/21/2022] pantoprazole  40 mg Oral QAC breakfast   rifaximin  550 mg Oral BID    Continuous Infusions:  sodium chloride 75 mL/hr at 06/20/22 1429   cefTRIAXone (ROCEPHIN)  IV Stopped (06/19/22 1631)     LOS: 2 days     Briant Cedar, MD [facility]  If 7PM-7AM,  please contact night-coverage [email or web address] 06/20/2022, 2:55 PM

## 2022-06-20 NOTE — Transfer of Care (Signed)
Immediate Anesthesia Transfer of Care Note  Patient: Trevor Reilly  Procedure(s) Performed: ESOPHAGOGASTRODUODENOSCOPY (EGD) WITH PROPOFOL  Patient Location: PACU  Anesthesia Type:MAC  Level of Consciousness: awake, alert , and oriented  Airway & Oxygen Therapy: Patient Spontanous Breathing and Patient connected to face mask oxygen  Post-op Assessment: Report given to RN, Post -op Vital signs reviewed and stable, and Patient moving all extremities X 4  Post vital signs: Reviewed and stable  Last Vitals:  Vitals Value Taken Time  BP 94/31   Temp    Pulse 83   Resp 12   SpO2 100     Last Pain:  Vitals:   06/20/22 1052  TempSrc: Temporal  PainSc: 0-No pain         Complications: No notable events documented.

## 2022-06-20 NOTE — Anesthesia Postprocedure Evaluation (Signed)
Anesthesia Post Note  Patient: Trevor Reilly  Procedure(s) Performed: ESOPHAGOGASTRODUODENOSCOPY (EGD) WITH PROPOFOL     Patient location during evaluation: Endoscopy Anesthesia Type: MAC Level of consciousness: awake and alert Pain management: pain level controlled Vital Signs Assessment: post-procedure vital signs reviewed and stable Respiratory status: spontaneous breathing, nonlabored ventilation, respiratory function stable and patient connected to nasal cannula oxygen Cardiovascular status: blood pressure returned to baseline and stable Postop Assessment: no apparent nausea or vomiting Anesthetic complications: no  No notable events documented.  Last Vitals:  Vitals:   06/20/22 1158 06/20/22 1200  BP: (!) 111/53   Pulse: 74   Resp: 13   Temp:  36.5 C  SpO2: 94%     Last Pain:  Vitals:   06/20/22 1200  TempSrc: Oral  PainSc:                  Trevor Iha

## 2022-06-20 NOTE — Anesthesia Procedure Notes (Signed)
Procedure Name: MAC Date/Time: 06/20/2022 11:23 AM  Performed by: Nelle Don, CRNAPre-anesthesia Checklist: Patient identified, Emergency Drugs available, Suction available and Patient being monitored Oxygen Delivery Method: Simple face mask

## 2022-06-20 NOTE — Progress Notes (Signed)
Initial Nutrition Assessment  DOCUMENTATION CODES:   Non-severe (moderate) malnutrition in context of chronic illness  INTERVENTION:  - 2 gram sodium diet.  - Ensure Plus High Protein po BID, each supplement provides 350 kcal and 20 grams of protein. - Encourage intake at all meals of protein rich food sources.   - Multivitamin with minerals daily to support micronutrient needs.   - Monitor weight trends.   NUTRITION DIAGNOSIS:   Moderate Malnutrition related to chronic illness (etoh cirrhosis) as evidenced by mild fat depletion, mild muscle depletion, percent weight loss (21% in 3 months).  GOAL:   Patient will meet greater than or equal to 90% of their needs  MONITOR:   PO intake, Supplement acceptance, Weight trends  REASON FOR ASSESSMENT:   Malnutrition Screening Tool    ASSESSMENT:   52 y.o. male with PMH etoh cirrhosis, osa not on cpap, pulmonary sarcoid, HTN who presented with hematemesis.  Patient had just returned from EGD at time of visit.  When asked about his weight patient reports it has been fluctuating the past few weeks.  Per EMR, patient weighed at 224# in January and now weighed at 176# - a 48# or 21% weight loss in 3 months, which is severe and significant for the time frame. Patient reports this is likely accurate.   Despite weight loss, patient endorses eating very well at home. Usually consumes 2-3 meals a day cooked by his sister in addition to snacks in between. Also consumes 2 Ensure's daily. Appetite has been good.   Current appetite is also good. Patient notes he is hungry for lunch as he has been NPO all morning for EGD.  Encouraged patient to consume 3 meals a day during admission, emphasizing protein rich food sources. Patient enjoys many protein rich foods such as chicken, eggs, yogurt, peanut butter, etc.  Patient agreeable to also receive Ensure during admission to support intake.   Medications reviewed and include: Zofran prn  Labs  reviewed:  Na 124   NUTRITION - FOCUSED PHYSICAL EXAM:  Flowsheet Row Most Recent Value  Orbital Region Mild depletion  Upper Arm Region Mild depletion  Thoracic and Lumbar Region No depletion  Buccal Region No depletion  Temple Region Moderate depletion  Clavicle Bone Region Mild depletion  Clavicle and Acromion Bone Region Mild depletion  Scapular Bone Region Unable to assess  Dorsal Hand No depletion  Patellar Region No depletion  Anterior Thigh Region No depletion  Posterior Calf Region No depletion  Edema (RD Assessment) None  Hair Reviewed  Eyes Reviewed  Mouth Reviewed  Skin Reviewed  Nails Reviewed       Diet Order:   Diet Order             Diet 2 gram sodium Room service appropriate? Yes; Fluid consistency: Thin  Diet effective now                   EDUCATION NEEDS:  Education needs have been addressed  Skin:  Skin Assessment: Reviewed RN Assessment  Last BM:  4/25  Height:  Ht Readings from Last 1 Encounters:  06/18/22  (1.753 m)   Weight:  Wt Readings from Last 1 Encounters:  06/18/22 80.2 kg    BMI:  Body mass index is 26.11 kg/m.  Estimated Nutritional Needs:  Kcal:  2250-2400 kcals Protein:  95-120 grams Fluid:  >/= 2.2L    Shelle Iron RD, LDN For contact information, refer to [facility].

## 2022-06-20 NOTE — Op Note (Signed)
[facility] Patient Name: Trevor Reilly Procedure Date: 06/20/2022 MRN: [id number] Attending MD: Iva Boop , MD, [id number] Date of Birth: [birth year] CSN: [id number] Age: 52 Admit Type: Inpatient Procedure:                Upper GI endoscopy Indications:              Epigastric abdominal pain, Hematemesis Providers:                Iva Boop, MD, Rozetta Nunnery, Technician,                            Carlena Hurl RN, RN Referring MD:              Medicines:                Monitored Anesthesia Care Complications:            No immediate complications. Estimated Blood Loss:     Estimated blood loss: none. Procedure:                Pre-Anesthesia Assessment:                           - Prior to the procedure, a History and Physical                            was performed, and patient medications and                            allergies were reviewed. The patient's tolerance of                            previous anesthesia was also reviewed. The risks                            and benefits of the procedure and the sedation                            options and risks were discussed with the patient.                            All questions were answered, and informed consent                            was obtained. Prior Anticoagulants: The patient has                            taken no anticoagulant or antiplatelet agents. ASA                            Grade Assessment: IV - A patient with severe                            systemic disease that is a constant threat to life.  After reviewing the risks and benefits, the patient                            was deemed in satisfactory condition to undergo the                            procedure.                           After obtaining informed consent, the endoscope was                            passed under direct vision. Throughout the                            procedure,  the patient's blood pressure, pulse, and                            oxygen saturations were monitored continuously. The                            [id number]-[id number] ([id number]) Olympus endoscope was introduced                            through the mouth, and advanced to the second part                            of duodenum. The upper GI endoscopy was                            accomplished without difficulty. The patient                            tolerated the procedure well. Scope In: Scope Out: Findings:      The examined esophagus was normal.      Diffuse mild mucosal changes characterized by mosaic pattern were found       in the gastric fundus and in the gastric body.      A 5 cm hiatal hernia was present.      The examined duodenum was normal.      The cardia and gastric fundus were normal on retroflexion. Impression:               - Normal esophagus.                           - Mosaic pattern mucosa in the gastric fundus and                            gastric body.                           - 5 cm hiatal hernia.                           - Normal examined duodenum.                           -  No specimens collected. No varices and no signs                            of ulcer or gastritis today (last year had active                            sarcoidosis) - cause of pain and vomiting not clear                            though wonder if hyponatremia related to vomiting.                            Had minor hematemesis likely from trauma. Moderate Sedation:      Not Applicable - Patient had care per Anesthesia. Recommendation:           - Patient has a contact number available for                            emergencies. The signs and symptoms of potential                            delayed complications were discussed with the                            patient. Return to normal activities tomorrow.                            Written discharge instructions were provided to the                             patient.                           - Resume previous diet.                           - daily PPI                           GI signing off at this point - F/U [facility] after dc                           Needs repeat EGD 1 year Dr. Chales Abrahams to screen for                            varices Procedure Code(s):        --- Professional ---                           [id number], Esophagogastroduodenoscopy, flexible,  transoral; diagnostic, including collection of                            specimen(s) by brushing or washing, when performed                            (separate procedure) Diagnosis Code(s):        --- Professional ---                           K44.9, Diaphragmatic hernia without obstruction or                            gangrene                           R10.13, Epigastric pain                           K92.0, Hematemesis CPT copyright 2022 [employer]. All rights reserved. The codes documented in this report are preliminary and upon coder review may  be revised to meet current compliance requirements. Iva Boop, MD 06/20/2022 11:45:38 AM This report has been signed electronically. Number of Addenda: 0

## 2022-06-20 NOTE — TOC Progression Note (Signed)
Transition of Care [facility]) - Progression Note    Patient Details  Name: Trevor Reilly MRN: [id number] Date of Birth: 23-Jul-1970  Transition of Care [facility]) CM/SW Contact  Coralyn Helling, [place] Phone Number: 06/20/2022, 10:29 AM  Clinical Narrative:     SDOH resources added to AVS.       Expected Discharge Plan and Services                                               Social Determinants of Health (SDOH) Interventions SDOH Screenings   Food Insecurity: Food Insecurity Present (06/18/2022)  Housing: High Risk (06/18/2022)  Transportation Needs: No Transportation Needs (06/18/2022)  Utilities: At Risk (06/18/2022)  Tobacco Use: Medium Risk (06/17/2022)    Readmission Risk Interventions    04/22/2022    3:40 PM  Readmission Risk Prevention Plan  HRI or Home Care Consult Complete  Social Work Consult for Recovery Care Planning/Counseling Complete  Palliative Care Screening Not Applicable  Medication Review Oceanographer) Complete

## 2022-06-20 NOTE — Interval H&P Note (Signed)
History and Physical Interval Note:  06/20/2022 11:19 AM  Trevor Reilly  has presented today for surgery, with the diagnosis of cirrhosis, minor hematemesis, epigastric pain.  The various methods of treatment have been discussed with the patient and family. After consideration of risks, benefits and other options for treatment, the patient has consented to  Procedure(s): ESOPHAGOGASTRODUODENOSCOPY (EGD) WITH PROPOFOL (N/A) as a surgical intervention.  The patient's history has been reviewed, patient examined, no change in status, stable for surgery.  I have reviewed the patient's chart and labs.  Questions were answered to the patient's satisfaction.     Stan Head

## 2022-06-21 ENCOUNTER — Inpatient Hospital Stay
Admission: RE | Admit: 2022-06-21 | Discharge: 2022-06-21 | Disposition: A | Discharge status: Home / Self Care | Payer: BC Managed Care – PPO | Source: Ambulatory Visit | Attending: Nurse Practitioner | Admitting: Nurse Practitioner

## 2022-06-21 ENCOUNTER — Ambulatory Visit: Payer: BC Managed Care – PPO | Admitting: Nurse Practitioner

## 2022-06-21 DIAGNOSIS — K920 Hematemesis: Secondary | ICD-10-CM | POA: Diagnosis not present

## 2022-06-21 LAB — CBC WITH DIFFERENTIAL/PLATELET
Abs Immature Granulocytes: 0.03 10*3/uL (ref 0.00–0.07)
Basophils Absolute: 0.1 10*3/uL (ref 0.0–0.1)
Basophils Relative: 2 %
Eosinophils Absolute: 0.5 10*3/uL (ref 0.0–0.5)
Eosinophils Relative: 9 %
HCT: 26.8 % — ABNORMAL LOW (ref 39.0–52.0)
Hemoglobin: 9.1 g/dL — ABNORMAL LOW (ref 13.0–17.0)
Immature Granulocytes: 1 %
Lymphocytes Relative: 21 %
Lymphs Abs: 1.1 10*3/uL (ref 0.7–4.0)
MCH: 35.4 pg — ABNORMAL HIGH (ref 26.0–34.0)
MCHC: 34 g/dL (ref 30.0–36.0)
MCV: 104.3 fL — ABNORMAL HIGH (ref 80.0–100.0)
Monocytes Absolute: 0.6 10*3/uL (ref 0.1–1.0)
Monocytes Relative: 11 %
Neutro Abs: 3.1 10*3/uL (ref 1.7–7.7)
Neutrophils Relative %: 56 %
Platelets: 85 10*3/uL — ABNORMAL LOW (ref 150–400)
RBC: 2.57 MIL/uL — ABNORMAL LOW (ref 4.22–5.81)
RDW: 17.7 % — ABNORMAL HIGH (ref 11.5–15.5)
WBC: 5.3 10*3/uL (ref 4.0–10.5)
nRBC: 0 % (ref 0.0–0.2)

## 2022-06-21 LAB — COMPREHENSIVE METABOLIC PANEL
ALT: 34 U/L (ref 0–44)
AST: 94 U/L — ABNORMAL HIGH (ref 15–41)
Albumin: 1.6 g/dL — ABNORMAL LOW (ref 3.5–5.0)
Alkaline Phosphatase: 113 U/L (ref 38–126)
Anion gap: 7 (ref 5–15)
BUN: 12 mg/dL (ref 6–20)
CO2: 18 mmol/L — ABNORMAL LOW (ref 22–32)
Calcium: 7.8 mg/dL — ABNORMAL LOW (ref 8.9–10.3)
Chloride: 102 mmol/L (ref 98–111)
Creatinine, Ser: 0.78 mg/dL (ref 0.61–1.24)
GFR, Estimated: >60 mL/min (ref >60)
Glucose, Bld: 121 mg/dL — ABNORMAL HIGH (ref 70–99)
Potassium: 4.5 mmol/L (ref 3.5–5.1)
Sodium: 127 mmol/L — ABNORMAL LOW (ref 135–145)
Total Bilirubin: 5.4 mg/dL — ABNORMAL HIGH (ref 0.3–1.2)
Total Protein: 5.2 g/dL — ABNORMAL LOW (ref 6.5–8.1)

## 2022-06-21 LAB — PROTIME-INR
INR: 2 — ABNORMAL HIGH (ref 0.8–1.2)
Prothrombin Time: 22.9 seconds — ABNORMAL HIGH (ref 11.4–15.2)

## 2022-06-21 NOTE — Discharge Summary (Signed)
Physician Discharge Summary   Patient: Trevor Reilly MRN: [id number] DOB: [birth year]  Admit date:     06/17/2022  Discharge date: 06/21/22  Discharge Physician: Briant Cedar   PCP: Sherral Hammers, FNP   Recommendations at discharge:   Follow-up with PCP in 1 week Follow-up with [facility] as scheduled   Discharge Diagnoses: Principal Problem:   Hematemesis Active Problems:   Alcoholic cirrhosis of liver with ascites (HCC)   OSA (obstructive sleep apnea)   Alcohol abuse   Hypertension   Malnutrition of moderate degree [facility])   Hospital Course: Trevor Reilly is a 52 y.o. male with medical history significant for etoh cirrhosis, osa not on cpap, pulmonary sarcoid, htn, who presents with small amount of hematemesis, 1 episode. Reports being in his usual state of health when PTA developed abdominal pain and one episode of emesis. There was blood streaked in the emesis. No further emesis and abd pain resolved. No melena or hematochezia. No varices history.  Patient admitted for further management.    Today, patient reports feeling much better, denies any nausea/vomiting, abdominal pain, chest pain, shortness of breath, fever/chills, abdominal swelling.  Discussed with Dr. Leone Payor, okay for patient to be discharged from his standpoint and resume diuretics.  Patient was able to take a shower independently.  Tolerated his diet without any issues.  Patient advised to follow-up with PCP as well as his [facility] as scheduled.  Advised to restart his diuretics.  Patient very eager to be discharged    Assessment and Plan:  Hematemesis/UGIB Likely from possible ??Mallory-Weiss tear, noted only episode, blood streaks, solitary episode CT abdomen with large portosystemic varices, trace ascites [facility], consulted, s/p EGD on 4/25 showed no varices and no signs of ulcer or gastritis S/p 3 days of ceftriaxone 1 g qd, stopped Continue PPI   Alcoholic  Cirrhosis Elevated LFTs, t.bili AAO X 4 Appears relatively compensated currently GI on board, appreciate recs Followed by transplant hepatology at [facility], currently on the list for transplant Restart home lasix, spiro Continue rifaximin, lactulose   Hyponatremia Chronic likely 2/2 liver cirrhosis,  Improving s/p gentle hydration for 1 day Urine studies showed plasma osmolality 270, urine osmolality 109, urine sodium 10 Held diuretics while admitted, advised to restart  Follow up with PCP, hepatologist   Anemia of chronic disease Chronic thrombocytopenia Likely from liver cirrhosis Hgb and PLT stable from baseline   OSA Not on cpap at home   Hypotension S/p gentle IV hydration Cont home midodrine   Pulmonary sarcoid Appears stable          Consultants: GI Procedures performed: EGD Disposition: Home Diet recommendation:  Cardiac diet   DISCHARGE MEDICATION: Allergies as of 06/21/2022   No Known Allergies      Medication List     STOP taking these medications    azithromycin 250 MG tablet Commonly known as: Zithromax   predniSONE 10 MG tablet Commonly known as: DELTASONE       TAKE these medications    albuterol 108 (90 Base) MCG/ACT inhaler Commonly known as: VENTOLIN HFA Inhale 2 puffs into the lungs See admin instructions. Inhale 2 puffs into the lungs every 4-6 hours as needed for shortness of breath or wheezing   Constulose 10 GM/15ML solution Generic drug: lactulose Take 20 g by mouth 3 (three) times daily. What changed: Another medication with the same name was removed. Continue taking this medication, and follow the directions you see here.  fluticasone 50 MCG/ACT nasal spray Commonly known as: FLONASE Place 1 spray into both nostrils 2 (two) times daily. What changed: how much to take   furosemide 20 MG tablet Commonly known as: LASIX Take 1 tablet (20 mg total) by mouth 2 (two) times daily. What changed:  how much to  take when to take this   hydrOXYzine 25 MG tablet Commonly known as: ATARAX Take 25 mg by mouth 3 (three) times daily as needed for anxiety.   midodrine 5 MG tablet Commonly known as: PROAMATINE Take 2 tablets (10 mg total) by mouth 2 (two) times daily with a meal.   pantoprazole 40 MG tablet Commonly known as: PROTONIX Take 1 tablet (40 mg total) by mouth 2 (two) times daily. Please call [phone] to schedule an office visit for more refills What changed:  when to take this additional instructions   spironolactone 50 MG tablet Commonly known as: ALDACTONE Take 1 tablet (50 mg total) by mouth daily.   Symbicort 160-4.5 MCG/ACT inhaler Generic drug: budesonide-formoterol Inhale 2 puffs into the lungs 2 (two) times daily.   Xifaxan 550 MG Tabs tablet Generic drug: rifaximin Take 1 tablet (550 mg total) by mouth 2 (two) times daily.        Follow-up Information     Sherral Hammers, FNP. Schedule an appointment as soon as possible for a visit in 1 week(s).   Specialty: Family Medicine Contact information: [address] [place] [address] [phone]                Discharge Exam: Ceasar Mons Weights   06/17/22 2023 06/18/22 1308  Weight: 83.9 kg 80.2 kg   General: NAD  Cardiovascular: S1, S2 present Respiratory: CTAB Abdomen: Soft, nontender, nondistended, bowel sounds present Musculoskeletal: No bilateral pedal edema noted Skin: Normal Psychiatry: Normal mood   Condition at discharge: stable  The results of significant diagnostics from this hospitalization (including imaging, microbiology, ancillary and laboratory) are listed below for reference.   Imaging Studies: CT ABDOMEN PELVIS W CONTRAST  Result Date: 06/17/2022 CLINICAL DATA:  Acute abdominal pain. EXAM: CT ABDOMEN AND PELVIS WITH CONTRAST TECHNIQUE: Multidetector CT imaging of the abdomen and pelvis was performed using the standard protocol following bolus administration of  intravenous contrast. RADIATION DOSE REDUCTION: This exam was performed according to the departmental dose-optimization program which includes automated exposure control, adjustment of the mA and/or kV according to patient size and/or use of iterative reconstruction technique. CONTRAST:  OMNIPAQUE IOHEXOL 300 MG/ML  SOLN COMPARISON:  Most recent CT 04/19/2022 FINDINGS: Lower chest: Faint peribronchial nodularity unchanged from prior exam. No acute airspace disease or pleural effusion. Hepatobiliary: Cirrhotic hepatic morphology. No evidence of focal liver lesion. Moderate gallbladder distension but no calcified gallstone. No biliary dilatation. The main portal vein is patent. Pancreas: No ductal dilatation or inflammation. Spleen: Normal in size without focal abnormality. Adrenals/Urinary Tract: Normal adrenal glands. No hydronephrosis or perinephric edema. Homogeneous renal enhancement with symmetric excretion on delayed phase imaging. Minimally distended urinary bladder, mild wall thickening. Stomach/Bowel: Diminished hiatal hernia from prior. Perigastric varices. The stomach is nondistended. There is no small bowel obstruction or inflammation. Appendix not confidently visualized, there is no appendicitis. No colonic inflammation. 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 Mild aortic atherosclerosis. Few prominent lymph nodes in the pelvis which are stable from prior exam, largest measuring 10 mm in the right external iliac station. Reproductive: Prostate is unremarkable. Other: Trace perihepatic and  pelvic ascites. Mild generalized edema of the intra-abdominal fat. No free air. Musculoskeletal: Mild chronic T9 compression deformity. There are no acute or suspicious osseous abnormalities. IMPRESSION: 1. Mild urinary bladder wall thickening, may be due to nondistention, however recommend correlation with urinalysis to  exclude urinary tract infection. 2. Cirrhosis with large portosystemic varices, most prominently affecting the right lower quadrant. Trace perihepatic and pelvic ascites. Aortic Atherosclerosis (ICD10-I70.0). Electronically Signed   By: Narda Rutherford M.D.   On: 06/17/2022 23:41    Microbiology: Results for orders placed or performed during the hospital encounter of 06/17/22  MRSA Next Gen by PCR, Nasal     Status: None   Collection Time: 06/18/22 12:59 PM   Specimen: Nasal Mucosa; Nasal Swab  Result Value Ref Range Status   MRSA by PCR Next Gen NOT DETECTED NOT DETECTED Final    Comment: (NOTE) The GeneXpert MRSA Assay (FDA approved for NASAL specimens only), is one component of a comprehensive MRSA colonization surveillance program. It is not intended to diagnose MRSA infection nor to guide or monitor treatment for MRSA infections. Test performance is not FDA approved in patients less than 34 years old. Performed at [facility], [address], [place], [place] [address]     Labs: CBC: Recent Labs  Lab 06/17/22 2031 06/19/22 0309 06/20/22 0315 06/21/22 0548  WBC 8.3 8.0 5.4 5.3  NEUTROABS  --   --  3.0 3.1  HGB 10.5* 9.4* 10.7* 9.1*  HCT 31.0* 29.2* 31.9* 26.8*  MCV 99.7 106.6* 102.6* 104.3*  PLT 99* 90* 98* 85*   Basic Metabolic Panel: Recent Labs  Lab 06/17/22 2031 06/19/22 0309 06/20/22 0315 06/21/22 0548  NA 120* 123* 124* 127*  K 4.6 4.8 4.6 4.5  CL 91* 97* 97* 102  CO2 22 20* 20* 18*  GLUCOSE 130* 90 98 121*  BUN 24* 18 11 12   CREATININE 1.02 0.83 0.91 0.78  CALCIUM 8.0* 8.1* 8.1* 7.8*   Liver Function Tests: Recent Labs  Lab 06/17/22 2031 06/20/22 0315 06/21/22 0548  AST 131* 111* 94*  ALT 43 38 34  ALKPHOS 131* 110 113  BILITOT 7.9* 7.5* 5.4*  PROT 6.9 6.2* 5.2*  ALBUMIN 2.1* 2.0* 1.6*   CBG: No results for input(s): "GLUCAP" in the last 168 hours.  Discharge time spent: greater than 30 minutes.  Signed: Briant Cedar, MD [facility] 06/21/2022

## 2022-06-21 NOTE — Progress Notes (Signed)
SDOH Interventions Today    Flowsheet Row Most Recent Value  SDOH Interventions   Food Insecurity Interventions Inpatient TOC, Other (Comment)  [Resources added to AVS]  Housing Interventions Inpatient TOC  [Resources added to AVS]  Utilities Interventions Inpatient TOC, Other (Comment)  [Resources added to AVS]

## 2022-06-21 NOTE — Plan of Care (Signed)
Patient is awake and alert orientated x4. Denies pain. Remains on RA. Patient stable for discharge as per MD order. Patient shows no signs or symptoms of acute distress at this time. Patient verbalized understanding of discharge instructions. Problem: Education: Goal: Knowledge of General Education information will improve Description: Including pain rating scale, medication(s)/side effects and non-pharmacologic comfort measures Outcome: Adequate for Discharge   Problem: Health Behavior/Discharge Planning: Goal: Ability to manage health-related needs will improve Outcome: Adequate for Discharge   Problem: Clinical Measurements: Goal: Ability to maintain clinical measurements within normal limits will improve Outcome: Adequate for Discharge Goal: Will remain free from infection Outcome: Adequate for Discharge Goal: Diagnostic test results will improve Outcome: Adequate for Discharge Goal: Respiratory complications will improve Outcome: Adequate for Discharge Goal: Cardiovascular complication will be avoided Outcome: Adequate for Discharge   Problem: Activity: Goal: Risk for activity intolerance will decrease Outcome: Adequate for Discharge   Problem: Nutrition: Goal: Adequate nutrition will be maintained Outcome: Adequate for Discharge   Problem: Coping: Goal: Level of anxiety will decrease Outcome: Adequate for Discharge   Problem: Elimination: Goal: Will not experience complications related to bowel motility Outcome: Adequate for Discharge Goal: Will not experience complications related to urinary retention Outcome: Adequate for Discharge   Problem: Pain Managment: Goal: General experience of comfort will improve Outcome: Adequate for Discharge   Problem: Safety: Goal: Ability to remain free from injury will improve Outcome: Adequate for Discharge   Problem: Skin Integrity: Goal: Risk for impaired skin integrity will decrease Outcome: Adequate for Discharge    Problem: Education: Goal: Knowledge of General Education information will improve Description: Including pain rating scale, medication(s)/side effects and non-pharmacologic comfort measures Outcome: Adequate for Discharge   Problem: Health Behavior/Discharge Planning: Goal: Ability to manage health-related needs will improve Outcome: Adequate for Discharge   Problem: Clinical Measurements: Goal: Ability to maintain clinical measurements within normal limits will improve Outcome: Adequate for Discharge Goal: Will remain free from infection Outcome: Adequate for Discharge Goal: Diagnostic test results will improve Outcome: Adequate for Discharge Goal: Respiratory complications will improve Outcome: Adequate for Discharge Goal: Cardiovascular complication will be avoided Outcome: Adequate for Discharge   Problem: Activity: Goal: Risk for activity intolerance will decrease Outcome: Adequate for Discharge   Problem: Nutrition: Goal: Adequate nutrition will be maintained Outcome: Adequate for Discharge   Problem: Coping: Goal: Level of anxiety will decrease Outcome: Adequate for Discharge   Problem: Elimination: Goal: Will not experience complications related to bowel motility Outcome: Adequate for Discharge Goal: Will not experience complications related to urinary retention Outcome: Adequate for Discharge   Problem: Pain Managment: Goal: General experience of comfort will improve Outcome: Adequate for Discharge   Problem: Safety: Goal: Ability to remain free from injury will improve Outcome: Adequate for Discharge   Problem: Skin Integrity: Goal: Risk for impaired skin integrity will decrease Outcome: Adequate for Discharge   Problem: Education: Goal: Knowledge of General Education information will improve Description: Including pain rating scale, medication(s)/side effects and non-pharmacologic comfort measures Outcome: Adequate for Discharge   Problem: Health  Behavior/Discharge Planning: Goal: Ability to manage health-related needs will improve Outcome: Adequate for Discharge   Problem: Clinical Measurements: Goal: Ability to maintain clinical measurements within normal limits will improve Outcome: Adequate for Discharge Goal: Will remain free from infection Outcome: Adequate for Discharge Goal: Diagnostic test results will improve Outcome: Adequate for Discharge Goal: Respiratory complications will improve Outcome: Adequate for Discharge Goal: Cardiovascular complication will be avoided Outcome: Adequate for Discharge   Problem: Activity:  Goal: Risk for activity intolerance will decrease Outcome: Adequate for Discharge   Problem: Nutrition: Goal: Adequate nutrition will be maintained Outcome: Adequate for Discharge   Problem: Coping: Goal: Level of anxiety will decrease Outcome: Adequate for Discharge   Problem: Elimination: Goal: Will not experience complications related to bowel motility Outcome: Adequate for Discharge Goal: Will not experience complications related to urinary retention Outcome: Adequate for Discharge   Problem: Pain Managment: Goal: General experience of comfort will improve Outcome: Adequate for Discharge   Problem: Safety: Goal: Ability to remain free from injury will improve Outcome: Adequate for Discharge   Problem: Skin Integrity: Goal: Risk for impaired skin integrity will decrease Outcome: Adequate for Discharge

## 2022-06-21 NOTE — Plan of Care (Signed)
  Problem: Education: Goal: Knowledge of General Education information will improve Description: Including pain rating scale, medication(s)/side effects and non-pharmacologic comfort measures Outcome: Progressing   Problem: Health Behavior/Discharge Planning: Goal: Ability to manage health-related needs will improve Outcome: Progressing   Problem: Clinical Measurements: Goal: Ability to maintain clinical measurements within normal limits will improve Outcome: Progressing Goal: Will remain free from infection Outcome: Progressing   Problem: Clinical Measurements: Goal: Diagnostic test results will improve Outcome: Not Progressing

## 2022-06-24 ENCOUNTER — Encounter (HOSPITAL_COMMUNITY): Payer: Self-pay | Admitting: Internal Medicine

## 2022-06-26 DIAGNOSIS — L989 Disorder of the skin and subcutaneous tissue, unspecified: Secondary | ICD-10-CM | POA: Diagnosis not present

## 2022-06-27 DIAGNOSIS — K7469 Other cirrhosis of liver: Secondary | ICD-10-CM | POA: Diagnosis not present

## 2022-07-02 DIAGNOSIS — Z719 Counseling, unspecified: Secondary | ICD-10-CM | POA: Diagnosis not present

## 2022-07-02 DIAGNOSIS — K7030 Alcoholic cirrhosis of liver without ascites: Secondary | ICD-10-CM | POA: Diagnosis not present

## 2022-07-02 DIAGNOSIS — G4733 Obstructive sleep apnea (adult) (pediatric): Secondary | ICD-10-CM | POA: Diagnosis not present

## 2022-07-02 DIAGNOSIS — K766 Portal hypertension: Secondary | ICD-10-CM | POA: Diagnosis not present

## 2022-07-02 DIAGNOSIS — Z01818 Encounter for other preprocedural examination: Secondary | ICD-10-CM | POA: Diagnosis not present

## 2022-07-02 DIAGNOSIS — L905 Scar conditions and fibrosis of skin: Secondary | ICD-10-CM | POA: Diagnosis not present

## 2022-07-03 DIAGNOSIS — D860 Sarcoidosis of lung: Secondary | ICD-10-CM | POA: Diagnosis not present

## 2022-07-03 DIAGNOSIS — K7030 Alcoholic cirrhosis of liver without ascites: Secondary | ICD-10-CM | POA: Diagnosis not present

## 2022-07-03 DIAGNOSIS — R531 Weakness: Secondary | ICD-10-CM | POA: Diagnosis not present

## 2022-07-03 DIAGNOSIS — E871 Hypo-osmolality and hyponatremia: Secondary | ICD-10-CM | POA: Diagnosis not present

## 2022-07-03 DIAGNOSIS — D696 Thrombocytopenia, unspecified: Secondary | ICD-10-CM | POA: Diagnosis not present

## 2022-07-05 DIAGNOSIS — L299 Pruritus, unspecified: Secondary | ICD-10-CM | POA: Diagnosis not present

## 2022-07-05 DIAGNOSIS — L905 Scar conditions and fibrosis of skin: Secondary | ICD-10-CM | POA: Diagnosis not present

## 2022-07-05 DIAGNOSIS — Z4802 Encounter for removal of sutures: Secondary | ICD-10-CM | POA: Diagnosis not present

## 2022-07-08 DIAGNOSIS — K7469 Other cirrhosis of liver: Secondary | ICD-10-CM | POA: Diagnosis not present

## 2022-07-09 DIAGNOSIS — R040 Epistaxis: Secondary | ICD-10-CM | POA: Diagnosis not present

## 2022-07-11 DIAGNOSIS — K7682 Hepatic encephalopathy: Secondary | ICD-10-CM | POA: Diagnosis not present

## 2022-07-11 DIAGNOSIS — I9589 Other hypotension: Secondary | ICD-10-CM | POA: Diagnosis not present

## 2022-07-11 DIAGNOSIS — K766 Portal hypertension: Secondary | ICD-10-CM | POA: Diagnosis not present

## 2022-07-11 DIAGNOSIS — K7031 Alcoholic cirrhosis of liver with ascites: Secondary | ICD-10-CM | POA: Diagnosis not present

## 2022-07-15 ENCOUNTER — Ambulatory Visit: Payer: BC Managed Care – PPO | Admitting: Nurse Practitioner

## 2022-07-15 DIAGNOSIS — F1021 Alcohol dependence, in remission: Secondary | ICD-10-CM | POA: Diagnosis not present

## 2022-07-16 DIAGNOSIS — D860 Sarcoidosis of lung: Secondary | ICD-10-CM | POA: Diagnosis not present

## 2022-07-16 DIAGNOSIS — K7030 Alcoholic cirrhosis of liver without ascites: Secondary | ICD-10-CM | POA: Diagnosis not present

## 2022-07-16 DIAGNOSIS — R531 Weakness: Secondary | ICD-10-CM | POA: Diagnosis not present

## 2022-07-16 DIAGNOSIS — E871 Hypo-osmolality and hyponatremia: Secondary | ICD-10-CM | POA: Diagnosis not present

## 2022-07-17 DIAGNOSIS — K7469 Other cirrhosis of liver: Secondary | ICD-10-CM | POA: Diagnosis not present

## 2022-07-23 DIAGNOSIS — K7469 Other cirrhosis of liver: Secondary | ICD-10-CM | POA: Diagnosis not present

## 2022-07-29 DIAGNOSIS — K7469 Other cirrhosis of liver: Secondary | ICD-10-CM | POA: Diagnosis not present

## 2022-08-05 DIAGNOSIS — K7469 Other cirrhosis of liver: Secondary | ICD-10-CM | POA: Diagnosis not present

## 2022-08-08 DIAGNOSIS — E441 Mild protein-calorie malnutrition: Secondary | ICD-10-CM | POA: Diagnosis not present

## 2022-08-08 DIAGNOSIS — K7682 Hepatic encephalopathy: Secondary | ICD-10-CM | POA: Diagnosis not present

## 2022-08-08 DIAGNOSIS — Z719 Counseling, unspecified: Secondary | ICD-10-CM | POA: Diagnosis not present

## 2022-08-08 DIAGNOSIS — K7031 Alcoholic cirrhosis of liver with ascites: Secondary | ICD-10-CM | POA: Diagnosis not present

## 2022-08-14 DIAGNOSIS — K7469 Other cirrhosis of liver: Secondary | ICD-10-CM | POA: Diagnosis not present

## 2022-08-14 DIAGNOSIS — J449 Chronic obstructive pulmonary disease, unspecified: Secondary | ICD-10-CM | POA: Diagnosis not present

## 2022-08-14 DIAGNOSIS — D869 Sarcoidosis, unspecified: Secondary | ICD-10-CM | POA: Diagnosis not present

## 2022-08-19 DIAGNOSIS — K7469 Other cirrhosis of liver: Secondary | ICD-10-CM | POA: Diagnosis not present

## 2022-08-27 DIAGNOSIS — K7469 Other cirrhosis of liver: Secondary | ICD-10-CM | POA: Diagnosis not present

## 2022-08-29 ENCOUNTER — Other Ambulatory Visit: Payer: Self-pay | Admitting: Gastroenterology

## 2022-09-09 DIAGNOSIS — K7469 Other cirrhosis of liver: Secondary | ICD-10-CM | POA: Diagnosis not present

## 2022-09-17 DIAGNOSIS — K7469 Other cirrhosis of liver: Secondary | ICD-10-CM | POA: Diagnosis not present

## 2022-09-23 DIAGNOSIS — K7469 Other cirrhosis of liver: Secondary | ICD-10-CM | POA: Diagnosis not present

## 2022-09-26 ENCOUNTER — Other Ambulatory Visit: Payer: Self-pay | Admitting: Gastroenterology

## 2022-09-27 DIAGNOSIS — F1021 Alcohol dependence, in remission: Secondary | ICD-10-CM | POA: Diagnosis not present

## 2022-09-30 ENCOUNTER — Other Ambulatory Visit (HOSPITAL_COMMUNITY): Payer: Self-pay | Admitting: Nurse Practitioner

## 2022-09-30 ENCOUNTER — Ambulatory Visit (HOSPITAL_COMMUNITY)
Admission: RE | Admit: 2022-09-30 | Discharge: 2022-09-30 | Disposition: A | Discharge status: Home / Self Care | Payer: BC Managed Care – PPO | Source: Ambulatory Visit | Attending: Nurse Practitioner | Admitting: Nurse Practitioner

## 2022-09-30 DIAGNOSIS — K7031 Alcoholic cirrhosis of liver with ascites: Secondary | ICD-10-CM | POA: Diagnosis not present

## 2022-09-30 DIAGNOSIS — R188 Other ascites: Secondary | ICD-10-CM

## 2022-09-30 MED ORDER — LIDOCAINE HCL 1 % IJ SOLN
INTRAMUSCULAR | Status: AC
  Filled 2022-09-30: qty 20

## 2022-09-30 NOTE — Procedures (Signed)
Patient presented today for requested paracentesis. Upon examination with ultrasound, no ascites was visualized. Paracentesis was not attempted today due to lack of fluid.  Kennieth Francois, PA-C 09/30/2022 2:20 PM

## 2022-10-01 DIAGNOSIS — F1021 Alcohol dependence, in remission: Secondary | ICD-10-CM | POA: Diagnosis not present

## 2022-10-02 DIAGNOSIS — R059 Cough, unspecified: Secondary | ICD-10-CM | POA: Diagnosis not present

## 2022-10-02 DIAGNOSIS — J449 Chronic obstructive pulmonary disease, unspecified: Secondary | ICD-10-CM | POA: Diagnosis not present

## 2022-10-02 DIAGNOSIS — R0789 Other chest pain: Secondary | ICD-10-CM | POA: Diagnosis not present

## 2022-10-02 DIAGNOSIS — J40 Bronchitis, not specified as acute or chronic: Secondary | ICD-10-CM | POA: Diagnosis not present

## 2022-10-02 DIAGNOSIS — D869 Sarcoidosis, unspecified: Secondary | ICD-10-CM | POA: Diagnosis not present

## 2022-10-02 DIAGNOSIS — J069 Acute upper respiratory infection, unspecified: Secondary | ICD-10-CM | POA: Diagnosis not present

## 2022-10-04 DIAGNOSIS — F1021 Alcohol dependence, in remission: Secondary | ICD-10-CM | POA: Diagnosis not present

## 2022-10-07 DIAGNOSIS — F1021 Alcohol dependence, in remission: Secondary | ICD-10-CM | POA: Diagnosis not present

## 2022-10-08 DIAGNOSIS — K7469 Other cirrhosis of liver: Secondary | ICD-10-CM | POA: Diagnosis not present

## 2022-10-14 DIAGNOSIS — R1314 Dysphagia, pharyngoesophageal phase: Secondary | ICD-10-CM | POA: Diagnosis not present

## 2022-10-14 DIAGNOSIS — M542 Cervicalgia: Secondary | ICD-10-CM | POA: Diagnosis not present

## 2022-10-14 DIAGNOSIS — D860 Sarcoidosis of lung: Secondary | ICD-10-CM | POA: Diagnosis not present

## 2022-10-14 DIAGNOSIS — F1021 Alcohol dependence, in remission: Secondary | ICD-10-CM | POA: Diagnosis not present

## 2022-10-14 DIAGNOSIS — R070 Pain in throat: Secondary | ICD-10-CM | POA: Diagnosis not present

## 2022-10-17 DIAGNOSIS — F1021 Alcohol dependence, in remission: Secondary | ICD-10-CM | POA: Diagnosis not present

## 2022-10-22 ENCOUNTER — Encounter (HOSPITAL_BASED_OUTPATIENT_CLINIC_OR_DEPARTMENT_OTHER): Payer: Self-pay | Admitting: Emergency Medicine

## 2022-10-22 ENCOUNTER — Emergency Department (HOSPITAL_BASED_OUTPATIENT_CLINIC_OR_DEPARTMENT_OTHER): Payer: BC Managed Care – PPO

## 2022-10-22 ENCOUNTER — Emergency Department (HOSPITAL_BASED_OUTPATIENT_CLINIC_OR_DEPARTMENT_OTHER)
Admission: EM | Admit: 2022-10-22 | Discharge: 2022-10-22 | Disposition: A | Discharge status: Home / Self Care | Payer: BC Managed Care – PPO | Attending: Emergency Medicine | Admitting: Emergency Medicine

## 2022-10-22 ENCOUNTER — Other Ambulatory Visit: Payer: Self-pay

## 2022-10-22 DIAGNOSIS — M47812 Spondylosis without myelopathy or radiculopathy, cervical region: Secondary | ICD-10-CM | POA: Diagnosis not present

## 2022-10-22 DIAGNOSIS — R040 Epistaxis: Secondary | ICD-10-CM | POA: Diagnosis not present

## 2022-10-22 DIAGNOSIS — M542 Cervicalgia: Secondary | ICD-10-CM | POA: Diagnosis not present

## 2022-10-22 DIAGNOSIS — R61 Generalized hyperhidrosis: Secondary | ICD-10-CM | POA: Insufficient documentation

## 2022-10-22 DIAGNOSIS — R509 Fever, unspecified: Secondary | ICD-10-CM | POA: Insufficient documentation

## 2022-10-22 DIAGNOSIS — R109 Unspecified abdominal pain: Secondary | ICD-10-CM | POA: Diagnosis not present

## 2022-10-22 DIAGNOSIS — R630 Anorexia: Secondary | ICD-10-CM | POA: Insufficient documentation

## 2022-10-22 DIAGNOSIS — R1011 Right upper quadrant pain: Secondary | ICD-10-CM | POA: Diagnosis not present

## 2022-10-22 DIAGNOSIS — J029 Acute pharyngitis, unspecified: Secondary | ICD-10-CM | POA: Diagnosis not present

## 2022-10-22 DIAGNOSIS — K110 Atrophy of salivary gland: Secondary | ICD-10-CM | POA: Diagnosis not present

## 2022-10-22 LAB — COMPREHENSIVE METABOLIC PANEL
ALT: 25 U/L (ref 0–44)
AST: 87 U/L — ABNORMAL HIGH (ref 15–41)
Albumin: 1.9 g/dL — ABNORMAL LOW (ref 3.5–5.0)
Alkaline Phosphatase: 140 U/L — ABNORMAL HIGH (ref 38–126)
Anion gap: 8 (ref 5–15)
BUN: 18 mg/dL (ref 6–20)
CO2: 22 mmol/L (ref 22–32)
Calcium: 7.3 mg/dL — ABNORMAL LOW (ref 8.9–10.3)
Chloride: 99 mmol/L (ref 98–111)
Creatinine, Ser: 1.2 mg/dL (ref 0.61–1.24)
GFR, Estimated: >60 mL/min (ref >60)
Glucose, Bld: 184 mg/dL — ABNORMAL HIGH (ref 70–99)
Potassium: 3.7 mmol/L (ref 3.5–5.1)
Sodium: 129 mmol/L — ABNORMAL LOW (ref 135–145)
Total Bilirubin: 4.8 mg/dL — ABNORMAL HIGH (ref 0.3–1.2)
Total Protein: 5.4 g/dL — ABNORMAL LOW (ref 6.5–8.1)

## 2022-10-22 LAB — CBC WITH DIFFERENTIAL/PLATELET
Abs Immature Granulocytes: 0.08 10*3/uL — ABNORMAL HIGH (ref 0.00–0.07)
Basophils Absolute: 0.1 10*3/uL (ref 0.0–0.1)
Basophils Relative: 1 %
Eosinophils Absolute: 0.1 10*3/uL (ref 0.0–0.5)
Eosinophils Relative: 1 %
HCT: 28.8 % — ABNORMAL LOW (ref 39.0–52.0)
Hemoglobin: 9.8 g/dL — ABNORMAL LOW (ref 13.0–17.0)
Immature Granulocytes: 1 %
Lymphocytes Relative: 8 %
Lymphs Abs: 0.9 10*3/uL (ref 0.7–4.0)
MCH: 32.6 pg (ref 26.0–34.0)
MCHC: 34 g/dL (ref 30.0–36.0)
MCV: 95.7 fL (ref 80.0–100.0)
Monocytes Absolute: 0.7 10*3/uL (ref 0.1–1.0)
Monocytes Relative: 7 %
Neutro Abs: 8.3 10*3/uL — ABNORMAL HIGH (ref 1.7–7.7)
Neutrophils Relative %: 82 %
Platelets: 95 10*3/uL — ABNORMAL LOW (ref 150–400)
RBC: 3.01 MIL/uL — ABNORMAL LOW (ref 4.22–5.81)
RDW: 17.2 % — ABNORMAL HIGH (ref 11.5–15.5)
WBC: 10.1 10*3/uL (ref 4.0–10.5)
nRBC: 0 % (ref 0.0–0.2)

## 2022-10-22 LAB — GROUP A STREP BY PCR: Group A Strep by PCR: NOT DETECTED

## 2022-10-22 MED ORDER — OXYCODONE HCL 5 MG PO TABS: 5.0000 mg | ORAL_TABLET | Freq: Four times a day (QID) | ORAL | 0 refills | Status: DC | PRN

## 2022-10-22 MED ORDER — IOHEXOL 300 MG/ML  SOLN
100.0000 mL | Freq: Once | INTRAMUSCULAR | Status: AC | PRN
  Administered 2022-10-22: 75 mL via INTRAVENOUS

## 2022-10-22 MED ORDER — OXYCODONE-ACETAMINOPHEN 5-325 MG PO TABS
1.0000 | ORAL_TABLET | Freq: Once | ORAL | Status: AC
  Administered 2022-10-22: 1 via ORAL
  Filled 2022-10-22: qty 1

## 2022-10-22 NOTE — ED Triage Notes (Signed)
Sore throat , abd pain , headache  x 3 weeks , seen by ENT and UC , has CT scheduled for 8/29. Reports unable to wait due to discomfort . No resp distress. Alert and oriented x 4

## 2022-10-22 NOTE — ED Provider Notes (Signed)
[place] [facility] AT [facility] Provider Note   CSN: [id number] Arrival date & time: 10/22/22  1250     History  Chief Complaint  Patient presents with   Sore Throat    Trevor Reilly is a 52 y.o. male.  Patient with history of lung sarcoidosis, alcohol induced liver cirrhosis presents for further evaluation of right sided throat pain for the past 6 weeks. He describes symptoms of morning mucus having to clear his nose and throat daily. He feels he has had a fever and reports intermittent night sweats. He was seen by his primary care provider early on and given antibiotics that did not change or improve his symptoms. He was referred to ENT and saw Dr. Doran Heater who scoped his throat. Chart reviewed and this study is reported as negative. He has been scheduled for a CT neck per note of ENT that is pending. This morning he woke, cleared his nose and throat consistent with ongoing symptoms but reports epistaxis which caused concern for him. No vomiting. He reports abdominal pain that is c/w history of cirrhosis without new quality or intensity. He is able to swallow solids and liquids but reports loss of appetite 2 days ago and has not been eating.   The history is provided by the patient. No language interpreter was used.  Sore Throat       Home Medications Prior to Admission medications   Medication Sig Start Date End Date Taking? Authorizing Provider  oxyCODONE (ROXICODONE) 5 MG immediate release tablet Take 1 tablet (5 mg total) by mouth every 6 (six) hours as needed for severe pain. 10/22/22  Yes Benjiman Core, MD  albuterol (VENTOLIN HFA) 108 (90 Base) MCG/ACT inhaler Inhale 2 puffs into the lungs See admin instructions. Inhale 2 puffs into the lungs every 4-6 hours as needed for shortness of breath or wheezing    [provider]  CONSTULOSE 10 GM/15ML solution Take 20 g by mouth 3 (three) times daily.    [provider]  fluticasone  (FLONASE) 50 MCG/ACT nasal spray Place 1 spray into both nostrils 2 (two) times daily. Patient taking differently: Place 2 sprays into both nostrils 2 (two) times daily. 01/09/22   Hunsucker, Lesia Sago, MD  furosemide (LASIX) 20 MG tablet Take 1 tablet (20 mg total) by mouth 2 (two) times daily. Patient taking differently: Take 60 mg by mouth in the morning. 02/08/22   Curatolo, Adam, DO  hydrOXYzine (ATARAX) 25 MG tablet Take 25 mg by mouth 3 (three) times daily as needed for anxiety.    [provider]  midodrine (PROAMATINE) 5 MG tablet Take 2 tablets (10 mg total) by mouth 2 (two) times daily with a meal. 04/23/22   Danford, Earl Lites, MD  pantoprazole (PROTONIX) 40 MG tablet TAKE 1 TABLET BY MOUTH TWICE DAILY . APPOINTMENT REQUIRED FOR FUTURE REFILLS 09/26/22   Lynann Bologna, MD  spironolactone (ALDACTONE) 50 MG tablet Take 1 tablet (50 mg total) by mouth daily. 02/08/22   Curatolo, Adam, DO  SYMBICORT 160-4.5 MCG/ACT inhaler Inhale 2 puffs into the lungs 2 (two) times daily.    [provider]  XIFAXAN 550 MG TABS tablet Take 1 tablet (550 mg total) by mouth 2 (two) times daily. 04/23/22   Danford, Earl Lites, MD      Allergies    Patient has no known allergies.    Review of Systems   Review of Systems  Physical Exam Updated Vital Signs BP 114/72  Pulse 88   Temp 98.3 F (36.8 C) (Oral)   Resp 18   Wt 90.7 kg   SpO2 100%   BMI 29.53 kg/m  Physical Exam Vitals and nursing note reviewed.  Constitutional:      General: He is not in acute distress.    Appearance: He is well-developed.  HENT:     Head: Normocephalic.     Nose: No congestion or rhinorrhea.     Comments: No mucosal edema. No epistaxis.     Mouth/Throat:     Mouth: No oral lesions.     Pharynx: No pharyngeal swelling or oropharyngeal exudate.     Tonsils: No tonsillar exudate or tonsillar abscesses.  Neck:     Comments: Right anterior and anteromedial neck tenderness without mass or  thyromegaly. Mild right TMJ tenderness with swelling. No trismus.  Cardiovascular:     Rate and Rhythm: Normal rate and regular rhythm.  Pulmonary:     Effort: Pulmonary effort is normal.     Breath sounds: Normal breath sounds. No wheezing, rhonchi or rales.  Abdominal:     Palpations: Abdomen is soft.     Tenderness: There is abdominal tenderness (Diffuse tenderness to soft abdomen. No ascites or fluid wave.).  Musculoskeletal:     Cervical back: Normal range of motion and neck supple.  Skin:    General: Skin is warm and dry.  Neurological:     Mental Status: He is alert and oriented to person, place, and time.     ED Results / Procedures / Treatments   Labs (all labs ordered are listed, but only abnormal results are displayed) Labs Reviewed  COMPREHENSIVE METABOLIC PANEL - Abnormal; Notable for the following components:      Result Value   Sodium 129 (*)    Glucose, Bld 184 (*)    Calcium 7.3 (*)    Total Protein 5.4 (*)    Albumin 1.9 (*)    AST 87 (*)    Alkaline Phosphatase 140 (*)    Total Bilirubin 4.8 (*)    All other components within normal limits  CBC WITH DIFFERENTIAL/PLATELET - Abnormal; Notable for the following components:   RBC 3.01 (*)    Hemoglobin 9.8 (*)    HCT 28.8 (*)    RDW 17.2 (*)    Platelets 95 (*)    Neutro Abs 8.3 (*)    Abs Immature Granulocytes 0.08 (*)    All other components within normal limits  GROUP A STREP BY PCR    EKG None  Radiology CT Soft Tissue Neck W Contrast  Result Date: 10/22/2022 CLINICAL DATA:  Occult malignancy.  Sore throat.  Headache. EXAM: CT NECK WITH CONTRAST TECHNIQUE: Multidetector CT imaging of the neck was performed using the standard protocol following the bolus administration of intravenous contrast. RADIATION DOSE REDUCTION: This exam was performed according to the departmental dose-optimization program which includes automated exposure control, adjustment of the mA and/or kV according to patient size  and/or use of iterative reconstruction technique. CONTRAST:  75mL OMNIPAQUE IOHEXOL 300 MG/ML  SOLN COMPARISON:  None Available. FINDINGS: Pharynx and larynx: No evidence of mucosal or submucosal mass lesion. No gross inflammatory change visible by CT. Salivary glands: Left parotid gland is normal. Right parotid gland shows fatty atrophy. Submandibular glands are normal. Thyroid: Normal except for a benign appearing calcification in the left lobe without definable nodule. Lymph nodes: No lymphadenopathy on either side of the neck. Vascular: No abnormal vascular finding. Limited intracranial: Normal  Visualized orbits: Normal Mastoids and visualized paranasal sinuses: Clear Skeleton: No significant cervical degenerative changes. Upper chest: Mild scarring and emphysema. Other: None IMPRESSION: 1. No evidence of mucosal or submucosal mass lesion. No gross inflammatory change visible by CT. 2. Fatty atrophy of the right parotid gland. Emphysema (ICD10-J43.9). Electronically Signed   By: Paulina Fusi M.D.   On: 10/22/2022 16:38    Procedures Procedures    Medications Ordered in ED Medications  iohexol (OMNIPAQUE) 300 MG/ML solution 100 mL (75 mLs Intravenous Contrast Given 10/22/22 1540)  oxyCODONE-acetaminophen (PERCOCET/ROXICET) 5-325 MG per tablet 1 tablet (1 tablet Oral Given 10/22/22 1627)    ED Course/ Medical Decision Making/ A&P Clinical Course as of 10/22/22 1654  Tue Oct 22, 2022  1432 Patient to ED with ss/sxs as per HPI, history of sarcoid affecting lung, h/o cirrhosis without ascites today, with throat and neck pain, morning mucus production, subjective fever x 6 weeks. He is pending CT neck by ENT. Will obtain this study in ED today, basic labs. He appears stable and in NAD.  [SU]  1617 Labs reviewed and appear to be stable. Requesting something for pain. Oxycodone ordered. CT pending radiology interpretation.  [SU]  1650 CT neck is unremarkable as interpreted by radiology. He is appropriate  for discharge home, follow up with Dr. Doran Heater as planned. #5 roxycodone 5 mg call in for patient comfort.  [SU]    Clinical Course User Index [SU] Elpidio Anis, PA-C                                 Medical Decision Making Amount and/or Complexity of Data Reviewed Labs: ordered. Radiology: ordered.  Risk Prescription drug management.           Final Clinical Impression(s) / ED Diagnoses Final diagnoses:  Sore throat    Rx / DC Orders ED Discharge Orders          Ordered    oxyCODONE (ROXICODONE) 5 MG immediate release tablet  Every 6 hours PRN        10/22/22 1651              Elpidio Anis, PA-C 10/22/22 1654    Alvira Monday, MD 10/22/22 2354

## 2022-10-22 NOTE — Discharge Instructions (Signed)
Your CT neck is negative and your las are at baseline with your history. You can be discharged home and should follow up with Dr. Doran Heater as planned to review CT scan and discuss further evaluation of ongoing throat pain.

## 2022-10-24 ENCOUNTER — Other Ambulatory Visit: Payer: Self-pay | Admitting: Gastroenterology

## 2022-10-28 DIAGNOSIS — F1021 Alcohol dependence, in remission: Secondary | ICD-10-CM | POA: Diagnosis not present

## 2022-10-30 DIAGNOSIS — F1021 Alcohol dependence, in remission: Secondary | ICD-10-CM | POA: Diagnosis not present

## 2022-10-31 DIAGNOSIS — K3189 Other diseases of stomach and duodenum: Secondary | ICD-10-CM | POA: Diagnosis not present

## 2022-10-31 DIAGNOSIS — K766 Portal hypertension: Secondary | ICD-10-CM | POA: Diagnosis not present

## 2022-10-31 DIAGNOSIS — K7031 Alcoholic cirrhosis of liver with ascites: Secondary | ICD-10-CM | POA: Diagnosis not present

## 2022-10-31 DIAGNOSIS — Z01818 Encounter for other preprocedural examination: Secondary | ICD-10-CM | POA: Diagnosis not present

## 2022-10-31 DIAGNOSIS — Z719 Counseling, unspecified: Secondary | ICD-10-CM | POA: Diagnosis not present

## 2022-10-31 DIAGNOSIS — F339 Major depressive disorder, recurrent, unspecified: Secondary | ICD-10-CM | POA: Diagnosis not present

## 2022-11-05 ENCOUNTER — Emergency Department (HOSPITAL_BASED_OUTPATIENT_CLINIC_OR_DEPARTMENT_OTHER)
Admission: EM | Admit: 2022-11-05 | Discharge: 2022-11-05 | Disposition: A | Discharge status: Home / Self Care | Payer: BC Managed Care – PPO | Attending: Emergency Medicine | Admitting: Emergency Medicine

## 2022-11-05 ENCOUNTER — Other Ambulatory Visit: Payer: Self-pay

## 2022-11-05 ENCOUNTER — Encounter (HOSPITAL_BASED_OUTPATIENT_CLINIC_OR_DEPARTMENT_OTHER): Payer: Self-pay | Admitting: Emergency Medicine

## 2022-11-05 ENCOUNTER — Emergency Department (HOSPITAL_BASED_OUTPATIENT_CLINIC_OR_DEPARTMENT_OTHER): Payer: BC Managed Care – PPO

## 2022-11-05 DIAGNOSIS — R519 Headache, unspecified: Secondary | ICD-10-CM | POA: Insufficient documentation

## 2022-11-05 DIAGNOSIS — R0781 Pleurodynia: Secondary | ICD-10-CM | POA: Insufficient documentation

## 2022-11-05 DIAGNOSIS — W01190A Fall on same level from slipping, tripping and stumbling with subsequent striking against furniture, initial encounter: Secondary | ICD-10-CM | POA: Diagnosis not present

## 2022-11-05 DIAGNOSIS — I10 Essential (primary) hypertension: Secondary | ICD-10-CM | POA: Diagnosis not present

## 2022-11-05 DIAGNOSIS — W19XXXA Unspecified fall, initial encounter: Secondary | ICD-10-CM

## 2022-11-05 MED ORDER — OXYCODONE HCL 5 MG PO TABS: 5.0000 mg | ORAL_TABLET | Freq: Four times a day (QID) | ORAL | 0 refills | Status: AC | PRN

## 2022-11-05 MED ORDER — LIDOCAINE 5 % EX PTCH
1.0000 | MEDICATED_PATCH | CUTANEOUS | Status: DC
  Administered 2022-11-05: 1 via TRANSDERMAL
  Filled 2022-11-05: qty 1

## 2022-11-05 MED ORDER — LIDOCAINE 5 % EX PTCH: 1.0000 | MEDICATED_PATCH | CUTANEOUS | 0 refills | Status: AC

## 2022-11-05 NOTE — ED Notes (Signed)
Pt discharged to home using teachback Method. Discharge instructions have been discussed with patient and/or family members. Pt verbally acknowledges understanding d/c instructions, has been given opportunity for questions to be answered, and endorses comprehension to checkout at registration before leaving.  

## 2022-11-05 NOTE — ED Triage Notes (Addendum)
Mechanical Fall 2 days ago , left rib cage pain . Head injury . Unable to recall if lost consciousness after fall .

## 2022-11-05 NOTE — Discharge Instructions (Addendum)
As discussed, no obvious rib fracture on chest x-ray imaging but there is sometimes rib fractures that we cannot see on x-ray so I would recommend treating it accordingly.  Recommend use of incentive spirometry at home as well as topical numbing agents as well as medications for treatment of your pain as we discussed.  Recommend follow-up with primary care for reassessment of your symptoms.  Please do not hesitate to return to emergency department for worrisome signs and symptoms we discussed become apparent.

## 2022-11-05 NOTE — ED Provider Notes (Signed)
[place] [facility] Provider Note   CSN: [id number] Arrival date & time: 11/05/22  1016     History  Chief Complaint  Patient presents with   Marletta Lor    Trevor Reilly is a 53 y.o. male.   Fall   52 year old male presents emergency department after mechanical fall.  Patient states that he was closing his front door when he turned around and lost his balance causing him to fall hitting his left ribs on a nearby piece of furniture and falling backwards striking his head on the ground.  Patient states that he does not think that he lost consciousness but was "out of it" for a few seconds before he is able to stand.  States that he usually ambulates with a walker but during this episode, his walker was not nearby.  States he has been with left-sided rib pain as well as posterior headache since incident occurred.  Denies any visual disturbance, gait abnormality from baseline, slurred speech, facial droop, weakness/sensory deficits in upper extremities.  Denies any shortness of breath, abdominal pain, nausea, vomiting.  Past medical history significant for sarcoidosis, cirrhosis, alcohol abuse, hypertension, anemia, thrombocytopenia, hyponatremia, hypoalbuminemia, portal hypertensive gastropathy  Home Medications Prior to Admission medications   Medication Sig Start Date End Date Taking? Authorizing Provider  lidocaine (LIDODERM) 5 % Place 1 patch onto the skin daily. Remove & Discard patch within 12 hours or as directed by MD 11/05/22  Yes Sherian Maroon A, PA  oxyCODONE (ROXICODONE) 5 MG immediate release tablet Take 1 tablet (5 mg total) by mouth every 6 (six) hours as needed for severe pain or breakthrough pain. 11/05/22  Yes Sherian Maroon A, PA  albuterol (VENTOLIN HFA) 108 (90 Base) MCG/ACT inhaler Inhale 2 puffs into the lungs See admin instructions. Inhale 2 puffs into the lungs every 4-6 hours as needed for shortness of breath or wheezing     [provider]  CONSTULOSE 10 GM/15ML solution Take 20 g by mouth 3 (three) times daily.    [provider]  fluticasone (FLONASE) 50 MCG/ACT nasal spray Place 1 spray into both nostrils 2 (two) times daily. Patient taking differently: Place 2 sprays into both nostrils 2 (two) times daily. 01/09/22   Hunsucker, Lesia Sago, MD  furosemide (LASIX) 20 MG tablet Take 1 tablet (20 mg total) by mouth 2 (two) times daily. Patient taking differently: Take 60 mg by mouth in the morning. 02/08/22   Curatolo, Adam, DO  hydrOXYzine (ATARAX) 25 MG tablet Take 25 mg by mouth 3 (three) times daily as needed for anxiety.    [provider]  midodrine (PROAMATINE) 5 MG tablet Take 2 tablets (10 mg total) by mouth 2 (two) times daily with a meal. 04/23/22   Danford, Earl Lites, MD  pantoprazole (PROTONIX) 40 MG tablet TAKE 1  BY MOUTH TWICE DAILY . APPOINTMENT REQUIRED FOR FUTURE REFILLS 10/24/22   Lynann Bologna, MD  spironolactone (ALDACTONE) 50 MG tablet Take 1 tablet (50 mg total) by mouth daily. 02/08/22   Curatolo, Adam, DO  SYMBICORT 160-4.5 MCG/ACT inhaler Inhale 2 puffs into the lungs 2 (two) times daily.    [provider]  XIFAXAN 550 MG TABS tablet Take 1 tablet (550 mg total) by mouth 2 (two) times daily. 04/23/22   Danford, Earl Lites, MD      Allergies    Patient has no known allergies.    Review of Systems   Review of Systems  All other  systems reviewed and are negative.   Physical Exam Updated Vital Signs BP 122/73   Pulse 74   Temp 98.4 F (36.9 C)   Resp 16   Wt 90 kg   SpO2 95%   BMI 29.30 kg/m  Physical Exam Vitals and nursing note reviewed.  Constitutional:      General: He is not in acute distress.    Appearance: He is well-developed.  HENT:     Head: Normocephalic and atraumatic.  Eyes:     Conjunctiva/sclera: Conjunctivae normal.  Cardiovascular:     Rate and Rhythm: Normal rate and regular rhythm.     Heart sounds: No murmur  heard. Pulmonary:     Effort: Pulmonary effort is normal. No respiratory distress.     Breath sounds: Normal breath sounds.  Abdominal:     Palpations: Abdomen is soft.     Tenderness: There is no abdominal tenderness.  Musculoskeletal:        General: No swelling.     Cervical back: Neck supple.     Comments: No midline cervical, thoracic, lumbar spine with no obvious top of her deformity noted.  Left lateral rib tenderness to palpation but otherwise, no chest tenderness to palpation.  No tenderness to palpation of upper or lower extremities with full range of motion.  Skin:    General: Skin is warm and dry.     Capillary Refill: Capillary refill takes less than 2 seconds.  Neurological:     Mental Status: He is alert.     Comments: Alert and oriented to self, place, time and event.   Speech is fluent, clear without dysarthria or dysphasia.   Strength 5/5 in upper/lower extremities   Sensation intact in upper/lower extremities   Normal gait.  CN I not tested  CN II not tested CN III, IV, VI PERRLA and EOMs intact bilaterally  CN V Intact sensation to sharp and light touch to the face  CN VII facial movements symmetric  CN VIII not tested  CN IX, X no uvula deviation, symmetric rise of soft palate  CN XI 5/5 SCM and trapezius strength bilaterally  CN XII Midline tongue protrusion, symmetric L/R movements     Psychiatric:        Mood and Affect: Mood normal.     ED Results / Procedures / Treatments   Labs (all labs ordered are listed, but only abnormal results are displayed) Labs Reviewed - No data to display  EKG None  Radiology DG Ribs Unilateral W/Chest Left  Result Date: 11/05/2022 CLINICAL DATA:  52 year old malep status post fall 2 days ago. Struck head. Pain. EXAM: LEFT RIBS AND CHEST - 3+ VIEW COMPARISON:  Chest radiographs 10/02/2022 and earlier. FINDINGS: PA view of the chest and 2 oblique views of the left ribs 1054 hours. Left rib marker is at the  anterior 10th rib costochondral region. Lung volumes and mediastinal contours remain normal. Visualized tracheal air column is within normal limits. Mild eventration of the right hemidiaphragm, normal variant, but also adjacent probable staple line in the right lower lung. No pneumothorax, pulmonary edema, pleural effusion or confluent lung opacity. Negative visible bowel gas. No acute displaced left rib fracture is identified. Chronic T9 compression fracture redemonstrated. Other visible osseous structures appear grossly intact. IMPRESSION: 1. No acute left rib fracture identified radiographically. Chronic T9 compression fracture. 2. No acute cardiopulmonary abnormality. Electronically Signed   By: Odessa Fleming M.D.   On: 11/05/2022 12:06   CT Head Wo Contrast  Result Date: 11/05/2022 CLINICAL DATA:  52 year old malep status post fall 2 days ago. Struck head. Pain. EXAM: CT HEAD WITHOUT CONTRAST TECHNIQUE: Contiguous axial images were obtained from the base of the skull through the vertex without intravenous contrast. RADIATION DOSE REDUCTION: This exam was performed according to the departmental dose-optimization program which includes automated exposure control, adjustment of the mA and/or kV according to patient size and/or use of iterative reconstruction technique. COMPARISON:  None Available. FINDINGS: Brain: Cerebral volume is within normal limits for age. No midline shift, ventriculomegaly, mass effect, evidence of mass lesion, intracranial hemorrhage or evidence of cortically based acute infarction. Minimal to mild for age scattered white matter hypodensity in both hemispheres. Otherwise normal gray-white differentiation. Vascular: Calcified atherosclerosis at the skull base. No suspicious intracranial vascular hyperdensity. Skull: Intact, negative. Sinuses/Orbits: Visualized paranasal sinuses and mastoids are clear. Other: No orbit or scalp soft tissue injury identified. IMPRESSION: 1. No acute intracranial  abnormality or acute traumatic injury identified. 2. Mild for age white matter changes, most commonly due to chronic small vessel disease. Electronically Signed   By: Odessa Fleming M.D.   On: 11/05/2022 12:03    Procedures Procedures    Medications Ordered in ED Medications  lidocaine (LIDODERM) 5 % 1 patch (1 patch Transdermal Patch Applied 11/05/22 1117)    ED Course/ Medical Decision Making/ A&P Clinical Course as of 11/05/22 1237  Tue Nov 05, 2022  1222 DG Ribs Unilateral W/Chest Left [CR]    Clinical Course User Index [CR] Peter Garter, PA                                 Medical Decision Making Amount and/or Complexity of Data Reviewed Radiology: ordered. Decision-making details documented in ED Course.  Risk Prescription drug management.   This patient presents to the ED for concern of fall, this involves an extensive number of treatment options, and is a complaint that carries with it a high risk of complications and morbidity.  The differential diagnosis includes CVA, pneumothorax, rib fracture   Co morbidities that complicate the patient evaluation  See HPI   Additional history obtained:  Additional history obtained from EMR External records from outside source obtained and reviewed including hospital records   Lab Tests:  N/a   Imaging Studies ordered:  I ordered imaging studies including chest x-ray with left ribs, CT head I independently visualized and interpreted imaging which showed  CT head: No acute intracranial abnormality.  Mild age-related white matter changes Chest x-ray with left ribs: No acute fracture or other cardiopulmonary abnormalities.  Chronic T9 compression fracture I agree with the radiologist interpretation   Cardiac Monitoring: / EKG:  The patient was maintained on a cardiac monitor.  I personally viewed and interpreted the cardiac monitored which showed an underlying rhythm of: Sinus rhythm   Consultations  Obtained:  N/a   Problem List / ED Course / Critical interventions / Medication management  Fall, left rib pain I ordered medication including Lidoderm   Reevaluation of the patient after these medicines showed that the patient improved I have reviewed the patients home medicines and have made adjustments as needed   Social Determinants of Health:  Former cigarette use.  Denies illicit drug use.   Test / Admission - Considered:  Fall, left rib pain Vitals signs within normal range and stable throughout visit. Imaging studies significant for: See above 52 year old male presents emergency department 2  days after fall.  Fall described as mechanical in nature with patient was turning and lost balance and fell.  Usually walks with a walker we do not have walker at time of fall.  Patient with complaints of left-sided rib pain as well as posterior headache.  On exam, patient with tenderness left lateral ribs as well as nonfocal neurologic exam.  CT imaging of head without any acute intracranial abnormality.  X-ray negative for any fracture or other acute abnormality.  Patient reassured by overall negative workup.  Will recommend treatment of pain at home with at home pain medication as well as topical numbing patches.  Patient has incentive spirometry at home and will recommend continued use of the same.  Close follow-up with primary care recommended for reevaluation of symptoms.  Treatment plan discussed at length with patient and he acknowledged understanding was agreeable to said plan.  Patient overall well-appearing, afebrile in no acute distress. Worrisome signs and symptoms were discussed with the patient, and the patient acknowledged understanding to return to the ED if noticed. Patient was stable upon discharge.          Final Clinical Impression(s) / ED Diagnoses Final diagnoses:  Fall, initial encounter  Rib pain on left side    Rx / DC Orders ED Discharge Orders           Ordered    lidocaine (LIDODERM) 5 %  Every 24 hours        11/05/22 1211    oxyCODONE (ROXICODONE) 5 MG immediate release tablet  Every 6 hours PRN        11/05/22 1215              Peter Garter, PA 11/05/22 1237    Virgina Norfolk, DO 11/05/22 1349

## 2022-11-11 DIAGNOSIS — F1021 Alcohol dependence, in remission: Secondary | ICD-10-CM | POA: Diagnosis not present

## 2022-11-13 DIAGNOSIS — F1021 Alcohol dependence, in remission: Secondary | ICD-10-CM | POA: Diagnosis not present

## 2022-11-14 ENCOUNTER — Other Ambulatory Visit: Payer: Self-pay | Admitting: Nurse Practitioner

## 2022-11-14 DIAGNOSIS — K7682 Hepatic encephalopathy: Secondary | ICD-10-CM

## 2022-11-14 DIAGNOSIS — K3189 Other diseases of stomach and duodenum: Secondary | ICD-10-CM

## 2022-11-14 DIAGNOSIS — R188 Other ascites: Secondary | ICD-10-CM

## 2022-11-14 DIAGNOSIS — K766 Portal hypertension: Secondary | ICD-10-CM | POA: Diagnosis not present

## 2022-11-14 DIAGNOSIS — K7030 Alcoholic cirrhosis of liver without ascites: Secondary | ICD-10-CM | POA: Diagnosis not present

## 2022-11-15 DIAGNOSIS — F1021 Alcohol dependence, in remission: Secondary | ICD-10-CM | POA: Diagnosis not present

## 2022-11-22 ENCOUNTER — Encounter (INDEPENDENT_AMBULATORY_CARE_PROVIDER_SITE_OTHER): Payer: Self-pay

## 2022-11-22 ENCOUNTER — Ambulatory Visit
Admission: RE | Admit: 2022-11-22 | Discharge: 2022-11-22 | Disposition: A | Discharge status: Home / Self Care | Payer: BC Managed Care – PPO | Source: Ambulatory Visit | Attending: Nurse Practitioner

## 2022-11-22 DIAGNOSIS — K3189 Other diseases of stomach and duodenum: Secondary | ICD-10-CM

## 2022-11-22 DIAGNOSIS — R188 Other ascites: Secondary | ICD-10-CM | POA: Diagnosis not present

## 2022-11-22 DIAGNOSIS — K7030 Alcoholic cirrhosis of liver without ascites: Secondary | ICD-10-CM

## 2022-11-22 DIAGNOSIS — K7460 Unspecified cirrhosis of liver: Secondary | ICD-10-CM | POA: Diagnosis not present

## 2022-11-22 DIAGNOSIS — K7682 Hepatic encephalopathy: Secondary | ICD-10-CM

## 2022-11-26 DIAGNOSIS — K7469 Other cirrhosis of liver: Secondary | ICD-10-CM | POA: Diagnosis not present

## 2022-11-27 DIAGNOSIS — F1021 Alcohol dependence, in remission: Secondary | ICD-10-CM | POA: Diagnosis not present

## 2022-12-04 DIAGNOSIS — F1021 Alcohol dependence, in remission: Secondary | ICD-10-CM | POA: Diagnosis not present

## 2022-12-05 DIAGNOSIS — Z23 Encounter for immunization: Secondary | ICD-10-CM | POA: Diagnosis not present

## 2022-12-05 DIAGNOSIS — F339 Major depressive disorder, recurrent, unspecified: Secondary | ICD-10-CM | POA: Diagnosis not present

## 2022-12-05 DIAGNOSIS — K7682 Hepatic encephalopathy: Secondary | ICD-10-CM | POA: Diagnosis not present

## 2022-12-05 DIAGNOSIS — K7030 Alcoholic cirrhosis of liver without ascites: Secondary | ICD-10-CM | POA: Diagnosis not present

## 2022-12-05 DIAGNOSIS — Z01818 Encounter for other preprocedural examination: Secondary | ICD-10-CM | POA: Diagnosis not present

## 2022-12-09 DIAGNOSIS — F1021 Alcohol dependence, in remission: Secondary | ICD-10-CM | POA: Diagnosis not present

## 2022-12-11 DIAGNOSIS — F1021 Alcohol dependence, in remission: Secondary | ICD-10-CM | POA: Diagnosis not present

## 2022-12-16 DIAGNOSIS — F1021 Alcohol dependence, in remission: Secondary | ICD-10-CM | POA: Diagnosis not present

## 2022-12-18 DIAGNOSIS — F1021 Alcohol dependence, in remission: Secondary | ICD-10-CM | POA: Diagnosis not present

## 2022-12-19 DIAGNOSIS — K7030 Alcoholic cirrhosis of liver without ascites: Secondary | ICD-10-CM | POA: Diagnosis not present

## 2022-12-19 DIAGNOSIS — D860 Sarcoidosis of lung: Secondary | ICD-10-CM | POA: Diagnosis not present

## 2022-12-19 DIAGNOSIS — Z23 Encounter for immunization: Secondary | ICD-10-CM | POA: Diagnosis not present

## 2022-12-23 DIAGNOSIS — F1021 Alcohol dependence, in remission: Secondary | ICD-10-CM | POA: Diagnosis not present

## 2022-12-25 DIAGNOSIS — F1021 Alcohol dependence, in remission: Secondary | ICD-10-CM | POA: Diagnosis not present

## 2022-12-26 DIAGNOSIS — E291 Testicular hypofunction: Secondary | ICD-10-CM | POA: Diagnosis not present

## 2022-12-26 DIAGNOSIS — R7989 Other specified abnormal findings of blood chemistry: Secondary | ICD-10-CM | POA: Diagnosis not present

## 2022-12-26 DIAGNOSIS — Z79890 Hormone replacement therapy (postmenopausal): Secondary | ICD-10-CM | POA: Diagnosis not present

## 2022-12-27 DIAGNOSIS — K7469 Other cirrhosis of liver: Secondary | ICD-10-CM | POA: Diagnosis not present

## 2022-12-30 ENCOUNTER — Other Ambulatory Visit: Payer: Self-pay | Admitting: Gastroenterology

## 2022-12-30 DIAGNOSIS — K7460 Unspecified cirrhosis of liver: Secondary | ICD-10-CM | POA: Diagnosis not present

## 2022-12-30 DIAGNOSIS — E291 Testicular hypofunction: Secondary | ICD-10-CM | POA: Diagnosis not present

## 2023-01-06 DIAGNOSIS — E291 Testicular hypofunction: Secondary | ICD-10-CM | POA: Diagnosis not present

## 2023-01-09 DIAGNOSIS — K7031 Alcoholic cirrhosis of liver with ascites: Secondary | ICD-10-CM | POA: Diagnosis not present

## 2023-01-09 DIAGNOSIS — K7469 Other cirrhosis of liver: Secondary | ICD-10-CM | POA: Diagnosis not present

## 2023-01-15 DIAGNOSIS — J449 Chronic obstructive pulmonary disease, unspecified: Secondary | ICD-10-CM | POA: Diagnosis not present

## 2023-01-15 DIAGNOSIS — G4700 Insomnia, unspecified: Secondary | ICD-10-CM | POA: Diagnosis not present

## 2023-01-15 DIAGNOSIS — D869 Sarcoidosis, unspecified: Secondary | ICD-10-CM | POA: Diagnosis not present

## 2023-01-15 DIAGNOSIS — K7469 Other cirrhosis of liver: Secondary | ICD-10-CM | POA: Diagnosis not present

## 2023-01-15 DIAGNOSIS — G4719 Other hypersomnia: Secondary | ICD-10-CM | POA: Diagnosis not present

## 2023-01-20 DIAGNOSIS — K7469 Other cirrhosis of liver: Secondary | ICD-10-CM | POA: Diagnosis not present

## 2023-01-31 DIAGNOSIS — K7469 Other cirrhosis of liver: Secondary | ICD-10-CM | POA: Diagnosis not present

## 2023-02-04 DIAGNOSIS — K7469 Other cirrhosis of liver: Secondary | ICD-10-CM | POA: Diagnosis not present

## 2023-02-15 HISTORY — PX: LIVER TRANSPLANT: SHX410

## 2023-04-15 ENCOUNTER — Other Ambulatory Visit: Payer: Self-pay | Admitting: Nurse Practitioner

## 2023-06-28 ENCOUNTER — Encounter (HOSPITAL_BASED_OUTPATIENT_CLINIC_OR_DEPARTMENT_OTHER): Payer: Self-pay | Admitting: Emergency Medicine

## 2023-06-28 ENCOUNTER — Emergency Department (HOSPITAL_BASED_OUTPATIENT_CLINIC_OR_DEPARTMENT_OTHER)

## 2023-06-28 ENCOUNTER — Emergency Department (HOSPITAL_BASED_OUTPATIENT_CLINIC_OR_DEPARTMENT_OTHER)
Admission: EM | Admit: 2023-06-28 | Discharge: 2023-06-28 | Disposition: A | Discharge status: Other Acute Inpatient Hospital | Attending: Emergency Medicine | Admitting: Emergency Medicine

## 2023-06-28 DIAGNOSIS — I10 Essential (primary) hypertension: Secondary | ICD-10-CM | POA: Insufficient documentation

## 2023-06-28 DIAGNOSIS — Z79899 Other long term (current) drug therapy: Secondary | ICD-10-CM | POA: Diagnosis not present

## 2023-06-28 DIAGNOSIS — G8918 Other acute postprocedural pain: Secondary | ICD-10-CM | POA: Insufficient documentation

## 2023-06-28 DIAGNOSIS — R1011 Right upper quadrant pain: Secondary | ICD-10-CM | POA: Diagnosis not present

## 2023-06-28 DIAGNOSIS — R1013 Epigastric pain: Secondary | ICD-10-CM | POA: Insufficient documentation

## 2023-06-28 DIAGNOSIS — R42 Dizziness and giddiness: Secondary | ICD-10-CM | POA: Diagnosis not present

## 2023-06-28 DIAGNOSIS — R1010 Upper abdominal pain, unspecified: Secondary | ICD-10-CM | POA: Diagnosis present

## 2023-06-28 LAB — CBC WITH DIFFERENTIAL/PLATELET
Abs Immature Granulocytes: 0.01 10*3/uL (ref 0.00–0.07)
Basophils Absolute: 0.1 10*3/uL (ref 0.0–0.1)
Basophils Relative: 2 %
Eosinophils Absolute: 0.2 10*3/uL (ref 0.0–0.5)
Eosinophils Relative: 3 %
HCT: 41.7 % (ref 39.0–52.0)
Hemoglobin: 13.6 g/dL (ref 13.0–17.0)
Immature Granulocytes: 0 %
Lymphocytes Relative: 50 %
Lymphs Abs: 3.1 10*3/uL (ref 0.7–4.0)
MCH: 28.8 pg (ref 26.0–34.0)
MCHC: 32.6 g/dL (ref 30.0–36.0)
MCV: 88.2 fL (ref 80.0–100.0)
Monocytes Absolute: 0.6 10*3/uL (ref 0.1–1.0)
Monocytes Relative: 10 %
Neutro Abs: 2.2 10*3/uL (ref 1.7–7.7)
Neutrophils Relative %: 35 %
Platelets: 196 10*3/uL (ref 150–400)
RBC: 4.73 MIL/uL (ref 4.22–5.81)
RDW: 13.4 % (ref 11.5–15.5)
WBC: 6.1 10*3/uL (ref 4.0–10.5)
nRBC: 0 % (ref 0.0–0.2)

## 2023-06-28 LAB — URINALYSIS, ROUTINE W REFLEX MICROSCOPIC
Bilirubin Urine: NEGATIVE
Glucose, UA: NEGATIVE mg/dL
Hgb urine dipstick: NEGATIVE
Ketones, ur: NEGATIVE mg/dL
Leukocytes,Ua: NEGATIVE
Nitrite: NEGATIVE
Protein, ur: NEGATIVE mg/dL
Specific Gravity, Urine: 1.02 (ref 1.005–1.030)
pH: 7 (ref 5.0–8.0)

## 2023-06-28 LAB — COMPREHENSIVE METABOLIC PANEL WITH GFR
ALT: 15 U/L (ref 0–44)
AST: 19 U/L (ref 15–41)
Albumin: 4.2 g/dL (ref 3.5–5.0)
Alkaline Phosphatase: 117 U/L (ref 38–126)
Anion gap: 16 — ABNORMAL HIGH (ref 5–15)
BUN: 14 mg/dL (ref 6–20)
CO2: 21 mmol/L — ABNORMAL LOW (ref 22–32)
Calcium: 9.7 mg/dL (ref 8.9–10.3)
Chloride: 104 mmol/L (ref 98–111)
Creatinine, Ser: 1.19 mg/dL (ref 0.61–1.24)
GFR, Estimated: >60 mL/min (ref >60)
Glucose, Bld: 102 mg/dL — ABNORMAL HIGH (ref 70–99)
Potassium: 4.5 mmol/L (ref 3.5–5.1)
Sodium: 141 mmol/L (ref 135–145)
Total Bilirubin: 0.5 mg/dL (ref 0.0–1.2)
Total Protein: 7.5 g/dL (ref 6.5–8.1)

## 2023-06-28 LAB — LIPASE, BLOOD: Lipase: 12 U/L (ref 11–51)

## 2023-06-28 MED ORDER — MORPHINE SULFATE (PF) 4 MG/ML IV SOLN
4.0000 mg | Freq: Once | INTRAVENOUS | Status: AC
  Administered 2023-06-28: 4 mg via INTRAVENOUS
  Filled 2023-06-28: qty 1

## 2023-06-28 MED ORDER — IOHEXOL 300 MG/ML  SOLN
100.0000 mL | Freq: Once | INTRAMUSCULAR | Status: AC | PRN
  Administered 2023-06-28: 100 mL via INTRAVENOUS

## 2023-06-28 MED ORDER — ONDANSETRON HCL 4 MG/2ML IJ SOLN
4.0000 mg | Freq: Once | INTRAMUSCULAR | Status: AC
  Administered 2023-06-28: 4 mg via INTRAVENOUS
  Filled 2023-06-28: qty 2

## 2023-06-28 NOTE — ED Provider Notes (Signed)
 [place] [facility] AT [facility] Provider Note   CSN: [id number] Arrival date & time: 06/28/23  1153     History  Chief Complaint  Patient presents with   Abdominal Pain   Post-op Problem    Trevor Reilly is a 53 y.o. male past medical history significant for hypertension, sarcoidosis, liver transplant and GERD presents today for upper abdominal pain and lightheadedness that started on 4/30.  Patient had a myringotomy device procedure on 4/29.  Patient states that he has this procedure every 3 months and has never had any issues afterwards.  Patient denies fever, chills, nausea, vomiting, diarrhea, chest pain, shortness of breath.  Patient also endorses decreased oral intake and early satiety.   Abdominal Pain      Home Medications Prior to Admission medications   Medication Sig Start Date End Date Taking? Authorizing Provider  albuterol  (VENTOLIN  HFA) 108 (90 Base) MCG/ACT inhaler Inhale 2 puffs into the lungs See admin instructions. Inhale 2 puffs into the lungs every 4-6 hours as needed for shortness of breath or wheezing    [provider]  CONSTULOSE  10 GM/15ML solution Take 20 g by mouth 3 (three) times daily.    [provider]  fluticasone  (FLONASE ) 50 MCG/ACT nasal spray Place 1 spray into both nostrils 2 (two) times daily. Patient taking differently: Place 2 sprays into both nostrils 2 (two) times daily. 01/09/22   Hunsucker, Archer Kobs, MD  furosemide  (LASIX ) 20 MG tablet Take 1 tablet (20 mg total) by mouth 2 (two) times daily. Patient taking differently: Take 60 mg by mouth in the morning. 02/08/22   Curatolo, Adam, DO  hydrOXYzine (ATARAX) 25 MG tablet Take 25 mg by mouth 3 (three) times daily as needed for anxiety.    [provider]  lidocaine  (LIDODERM ) 5 % Place 1 patch onto the skin daily. Remove & Discard patch within 12 hours or as directed by MD 11/05/22   [place] Butter, PA  midodrine  (PROAMATINE ) 5 MG  tablet Take 2 tablets (10 mg total) by mouth 2 (two) times daily with a meal. 04/23/22   Danford, Willis Harter, MD  oxyCODONE  (ROXICODONE ) 5 MG immediate release tablet Take 1 tablet (5 mg total) by mouth every 6 (six) hours as needed for severe pain or breakthrough pain. 11/05/22   Neil Balls A, PA  pantoprazole  (PROTONIX ) 40 MG tablet TAKE 1 TABLET BY MOUTH TWICE DAILY . APPOINTMENT REQUIRED FOR FUTURE REFILLS 12/31/22   Lajuan Pila, MD  spironolactone  (ALDACTONE ) 50 MG tablet Take 1 tablet (50 mg total) by mouth daily. 02/08/22   Curatolo, Adam, DO  SYMBICORT 160-4.5 MCG/ACT inhaler Inhale 2 puffs into the lungs 2 (two) times daily.    [provider]  XIFAXAN  550 MG TABS tablet Take 1 tablet (550 mg total) by mouth 2 (two) times daily. 04/23/22   Danford, Willis Harter, MD      Allergies    Patient has no known allergies.    Review of Systems   Review of Systems  Constitutional:  Positive for appetite change.  Gastrointestinal:  Positive for abdominal pain.  Neurological:  Positive for light-headedness.    Physical Exam Updated Vital Signs BP 134/88 (BP Location: Left Arm)   Pulse 70   Temp 98.6 F (37 C) (Oral)   Resp 14   Ht 5\' 9"  (1.753 m)   Wt 89.4 kg   SpO2 99%   BMI 29.09 kg/m  Physical Exam Vitals and nursing note reviewed.  Constitutional:      General: He is not in acute distress.    Appearance: He is well-developed. He is not ill-appearing, toxic-appearing or diaphoretic.  HENT:     Head: Normocephalic and atraumatic.     Mouth/Throat:     Mouth: Mucous membranes are moist.  Eyes:     Extraocular Movements: Extraocular movements intact.     Conjunctiva/sclera: Conjunctivae normal.     Pupils: Pupils are equal, round, and reactive to light.  Cardiovascular:     Rate and Rhythm: Normal rate and regular rhythm.     Heart sounds: Normal heart sounds. No murmur heard. Pulmonary:     Effort: Pulmonary effort is normal. No respiratory distress.      Breath sounds: Normal breath sounds.  Abdominal:     General: Bowel sounds are normal. There is no distension.     Palpations: Abdomen is soft.     Tenderness: There is abdominal tenderness in the right upper quadrant and epigastric area.  Musculoskeletal:        General: No swelling.     Cervical back: Neck supple.  Skin:    General: Skin is warm and dry.     Capillary Refill: Capillary refill takes less than 2 seconds.  Neurological:     General: No focal deficit present.     Mental Status: He is alert.     Cranial Nerves: No cranial nerve deficit.  Psychiatric:        Mood and Affect: Mood normal.     ED Results / Procedures / Treatments   Labs (all labs ordered are listed, but only abnormal results are displayed) Labs Reviewed  COMPREHENSIVE METABOLIC PANEL WITH GFR - Abnormal; Notable for the following components:      Result Value   CO2 21 (*)    Glucose, Bld 102 (*)    Anion gap 16 (*)    All other components within normal limits  CBC WITH DIFFERENTIAL/PLATELET  LIPASE, BLOOD  URINALYSIS, ROUTINE W REFLEX MICROSCOPIC    EKG None  Radiology CT ABDOMEN PELVIS W CONTRAST Result Date: 06/28/2023 CLINICAL DATA:  Epigastric and right upper quadrant abdominal pain for several days. EXAM: CT ABDOMEN AND PELVIS WITH CONTRAST TECHNIQUE: Multidetector CT imaging of the abdomen and pelvis was performed using the standard protocol following bolus administration of intravenous contrast. RADIATION DOSE REDUCTION: This exam was performed according to the departmental dose-optimization program which includes automated exposure control, adjustment of the mA and/or kV according to patient size and/or use of iterative reconstruction technique. CONTRAST:  OMNIPAQUE  IOHEXOL  300 MG/ML  SOLN COMPARISON:  06/17/2022 FINDINGS: Lower Chest: No acute findings. Hepatobiliary: No suspicious hepatic masses identified. Patient has undergone cholecystectomy since prior exam. An internal stent is  seen within the common bile duct and duodenum, without biliary ductal dilatation. Mild pneumobilia is noted. No evidence of ascites. A rim enhancing fluid collection is seen along the posterior margin of the upper right hepatic lobe which measures 4.4 x 2.5 cm. This is consistent with a postop fluid collection, with differential diagnosis including biloma, abscess, and seroma. Pancreas: No mass or inflammatory changes. Mild pancreatic ductal dilatation noted, without evidence of mass or other obstructing etiology. Spleen: Within normal limits in size and appearance. Adrenals/Urinary Tract: No suspicious masses identified. No evidence of ureteral calculi or hydronephrosis. Unremarkable unopacified urinary bladder. Stomach/Bowel: No evidence of obstruction, inflammatory process or abnormal fluid collections. Normal appendix visualized. Vascular/Lymphatic: No pathologically enlarged lymph nodes. No acute vascular findings. 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 Reproductive:  No mass or other significant abnormality. Other:  None. Musculoskeletal: No suspicious bone lesions identified. Stable mild wedge compression deformity of the T11 vertebral body. IMPRESSION: Interval cholecystectomy. Internal biliary stent in place, without biliary ductal dilatation. 4.4 cm rim enhancing fluid collection along the posterior margin of the superior right hepatic lobe, consistent with a postop fluid collection. Differential diagnosis includes biloma, abscess, and seroma. Electronically Signed   By: Marlyce Sine M.D.   On: 06/28/2023 15:31    Procedures Procedures    Medications Ordered in ED Medications  morphine  (PF) 4 MG/ML injection 4 mg (4 mg Intravenous Given 06/28/23 1353)  ondansetron  (ZOFRAN ) injection 4 mg (4 mg Intravenous Given 06/28/23 1352)  iohexol  (OMNIPAQUE ) 300 MG/ML solution 100 mL (100 mLs Intravenous Contrast Given 06/28/23 1449)  morphine  (PF) 4 MG/ML injection 4  mg (4 mg Intravenous Given 06/28/23 1631)  ondansetron  (ZOFRAN ) injection 4 mg (4 mg Intravenous Given 06/28/23 1628)    ED Course/ Medical Decision Making/ A&P                                 Medical Decision Making Amount and/or Complexity of Data Reviewed Labs: ordered. Radiology: ordered.  Risk Prescription drug management.   This patient presents to the ED for concern of abdominal pain differential diagnosis includes surgery complication, pancreatitis, choledocholithiasis, acute cholecystitis, hematoma    Additional history obtained:  Additional history obtained from significant other External records from outside source obtained and reviewed including Care Everywhere   Lab Tests:  I Ordered, and personally interpreted labs.  The pertinent results include: CBC WNL, mildly decreased CO2, mildly elevated anion gap at 16, UA WNL   Imaging Studies ordered:  I ordered imaging studies including CT abdomen pelvis with contrast I independently visualized and interpreted imaging which showed interval cholecystectomy.  Internal biliary stent in place without biliary ductal dilation.  4.4 cm rim-enhancing fluid collection along the posterior margin of the superior right hepatic lobe, consistent with a postop fluid collection. I agree with the radiologist interpretation   Medicines ordered and prescription drug management:  I ordered medication including morphine  and Zofran  for pain and nausea Reevaluation of the patient after these medicines showed that the patient improved I have reviewed the patients home medicines and have made adjustments as needed   Problem List / ED Course:  [facility], Dr. Lotus Round with [facility] Brothers who felt the patient should be admitted to the transplant team [facility], Dr. Colman Deans with transplant team who was agreeable to admission for observation and potential intervention.        Final Clinical Impression(s) / ED Diagnoses Final diagnoses:   Post-operative pain    Rx / DC Orders ED Discharge Orders     None         Carie Charity, PA-C 06/28/23 1842    Trish Furl, MD 06/29/23 [phone]

## 2023-06-28 NOTE — ED Triage Notes (Signed)
 Pt c/o general upper abd pain and lightheadedness that started 4/30, one day s/p procedure for adjustment or removal of myringotomy device (stent); sts he has this procedure q 3 mos and has never had any issues

## 2023-11-06 ENCOUNTER — Encounter (HOSPITAL_BASED_OUTPATIENT_CLINIC_OR_DEPARTMENT_OTHER): Payer: Self-pay | Admitting: Emergency Medicine

## 2023-11-06 ENCOUNTER — Other Ambulatory Visit: Payer: Self-pay

## 2023-11-06 ENCOUNTER — Emergency Department (HOSPITAL_BASED_OUTPATIENT_CLINIC_OR_DEPARTMENT_OTHER): Admission: EM | Admit: 2023-11-06 | Discharge: 2023-11-06 | Disposition: A | Discharge status: Home / Self Care

## 2023-11-06 DIAGNOSIS — M25511 Pain in right shoulder: Secondary | ICD-10-CM | POA: Diagnosis present

## 2023-11-06 DIAGNOSIS — G8929 Other chronic pain: Secondary | ICD-10-CM | POA: Insufficient documentation

## 2023-11-06 DIAGNOSIS — M25512 Pain in left shoulder: Secondary | ICD-10-CM | POA: Diagnosis not present

## 2023-11-06 MED ORDER — PREDNISONE 20 MG PO TABS
20.0000 mg | ORAL_TABLET | Freq: Once | ORAL | Status: AC
  Administered 2023-11-06: 20 mg via ORAL
  Filled 2023-11-06: qty 1

## 2023-11-06 MED ORDER — OXYCODONE-ACETAMINOPHEN 5-325 MG PO TABS
2.0000 | ORAL_TABLET | Freq: Once | ORAL | Status: AC
Start: 2023-11-06 — End: 2023-11-06
  Administered 2023-11-06: 2 via ORAL
  Filled 2023-11-06: qty 2

## 2023-11-06 MED ORDER — PREDNISONE 20 MG PO TABS: 40.0000 mg | ORAL_TABLET | Freq: Every day | ORAL | 0 refills | Status: AC

## 2023-11-06 NOTE — Telephone Encounter (Signed)
 Pt calling back to check on status of Rx request.  Advised msg was sent to provider. Will send additional msg; unable to know timeframe as provider likely in OR.  Pt in agreement. Requests callback if there are issues/questions.

## 2023-11-06 NOTE — ED Triage Notes (Signed)
 Pt c/o bilateral shoulder pain x 2 months, has seen ortho and given prednisone  with no relief; pt also c/o mid abd pain x 1 week

## 2023-11-06 NOTE — ED Provider Notes (Signed)
 [place] [facility] Provider Note   CSN: [id number] Arrival date & time: 11/06/23  8165     Patient presents with: Shoulder Pain and Abdominal Pain   Trevor Reilly is a 53 y.o. male patient who presents to the emerged from today for further evaluation of a medication request.  Patient has bilateral chronic bursitis and was seen by his orthopedist recently.  He was supposed to get a prednisone  prescription but the orthopedist was in surgery and patient has gone now 2 or 3 days not being able to get it filled.  Presents here for medication request.  Patient also complaining of some mild epigastric abdominal pain which she states typically happens after he gets a new stent in his liver as he does have a history of a liver transplant.  He did get a stent approximately 2 weeks ago.  He states that this is normal for him.    Shoulder Pain Abdominal Pain      Prior to Admission medications   Medication Sig Start Date End Date Taking? Authorizing Provider  predniSONE  (DELTASONE ) 20 MG tablet Take 2 tablets (40 mg total) by mouth daily. 11/06/23  Yes Theotis, Janaiah Vetrano M, PA-C  albuterol  (VENTOLIN  HFA) 108 (90 Base) MCG/ACT inhaler Inhale 2 puffs into the lungs See admin instructions. Inhale 2 puffs into the lungs every 4-6 hours as needed for shortness of breath or wheezing    [provider]  CONSTULOSE  10 GM/15ML solution Take 20 g by mouth 3 (three) times daily.    [provider]  fluticasone  (FLONASE ) 50 MCG/ACT nasal spray Place 1 spray into both nostrils 2 (two) times daily. Patient taking differently: Place 2 sprays into both nostrils 2 (two) times daily. 01/09/22   Hunsucker, Donnice SAUNDERS, MD  furosemide  (LASIX ) 20 MG tablet Take 1 tablet (20 mg total) by mouth 2 (two) times daily. Patient taking differently: Take 60 mg by mouth in the morning. 02/08/22   Curatolo, Adam, DO  hydrOXYzine (ATARAX) 25 MG tablet Take 25 mg by mouth 3  (three) times daily as needed for anxiety.    [provider]  lidocaine  (LIDODERM ) 5 % Place 1 patch onto the skin daily. Remove & Discard patch within 12 hours or as directed by MD 11/05/22   Silver Wonda LABOR, PA  midodrine  (PROAMATINE ) 5 MG tablet Take 2 tablets (10 mg total) by mouth 2 (two) times daily with a meal. 04/23/22   Danford, Lonni SQUIBB, MD  oxyCODONE  (ROXICODONE ) 5 MG immediate release tablet Take 1 tablet (5 mg total) by mouth every 6 (six) hours as needed for severe pain or breakthrough pain. 11/05/22   Silver Wonda A, PA  pantoprazole  (PROTONIX ) 40 MG tablet TAKE 1 TABLET BY MOUTH TWICE DAILY . APPOINTMENT REQUIRED FOR FUTURE REFILLS 12/31/22   Charlanne Groom, MD  spironolactone  (ALDACTONE ) 50 MG tablet Take 1 tablet (50 mg total) by mouth daily. 02/08/22   Curatolo, Adam, DO  SYMBICORT 160-4.5 MCG/ACT inhaler Inhale 2 puffs into the lungs 2 (two) times daily.    [provider]  XIFAXAN  550 MG TABS tablet Take 1 tablet (550 mg total) by mouth 2 (two) times daily. 04/23/22   Danford, Lonni SQUIBB, MD    Allergies: Penicillins    Review of Systems  Gastrointestinal:  Positive for abdominal pain.  All other systems reviewed and are negative.   Updated Vital Signs BP 118/89 (BP Location: Right Arm)   Pulse 87   Temp 98.1  F (36.7 C) (Oral)   Resp 18   Ht 5' 9 (1.753 m)   Wt 95.3 kg   SpO2 100%   BMI 31.01 kg/m   Physical Exam Vitals and nursing note reviewed.  Constitutional:      Appearance: Normal appearance.  HENT:     Head: Normocephalic and atraumatic.  Eyes:     General:        Right eye: No discharge.        Left eye: No discharge.     Conjunctiva/sclera: Conjunctivae normal.  Pulmonary:     Effort: Pulmonary effort is normal.  Skin:    General: Skin is warm and dry.     Findings: No rash.  Neurological:     General: No focal deficit present.     Mental Status: He is alert.  Psychiatric:        Mood and Affect: Mood normal.         Behavior: Behavior normal.     (all labs ordered are listed, but only abnormal results are displayed) Labs Reviewed - No data to display  EKG: None  Radiology: No results found.   Procedures   Medications Ordered in the ED  oxyCODONE -acetaminophen  (PERCOCET/ROXICET) 5-325 MG per tablet 2 tablet (has no administration in time range)  predniSONE  (DELTASONE ) tablet 20 mg (has no administration in time range)     Medical Decision Making Trevor Reilly is a 53 y.o. male patient who presents to the emergency department today for further evaluation of bilateral chronic shoulder pain.  Will give him his first dose of prednisone  here in addition to 2 of Percocet.  I will also fill his prednisone  prescription and have him follow-up with his orthopedist.  Strict return precautions were discussed.  He is safe for discharge.   Risk Prescription drug management.     Final diagnoses:  Chronic pain of both shoulders    ED Discharge Orders          Ordered    predniSONE  (DELTASONE ) 20 MG tablet  Daily        11/06/23 1944               Theotis Cameron HERO, PA-C 11/06/23 1950    Kammerer, Megan L, DO 11/10/23 1324

## 2023-11-06 NOTE — Discharge Instructions (Signed)
 Please fill up your prednisone  prescription and take as prescribed.  You can follow-up with your orthopedist.  You may return to the emergency department for any worsening symptoms.

## 2023-11-18 ENCOUNTER — Emergency Department (HOSPITAL_BASED_OUTPATIENT_CLINIC_OR_DEPARTMENT_OTHER)
Admission: EM | Admit: 2023-11-18 | Discharge: 2023-11-18 | Disposition: A | Discharge status: Home / Self Care | Attending: Emergency Medicine | Admitting: Emergency Medicine

## 2023-11-18 ENCOUNTER — Emergency Department (HOSPITAL_BASED_OUTPATIENT_CLINIC_OR_DEPARTMENT_OTHER)

## 2023-11-18 ENCOUNTER — Other Ambulatory Visit: Payer: Self-pay

## 2023-11-18 ENCOUNTER — Encounter (HOSPITAL_BASED_OUTPATIENT_CLINIC_OR_DEPARTMENT_OTHER): Payer: Self-pay

## 2023-11-18 DIAGNOSIS — R1011 Right upper quadrant pain: Secondary | ICD-10-CM | POA: Diagnosis present

## 2023-11-18 LAB — COMPREHENSIVE METABOLIC PANEL WITH GFR
ALT: 8 U/L (ref 0–44)
AST: 18 U/L (ref 15–41)
Albumin: 4.4 g/dL (ref 3.5–5.0)
Alkaline Phosphatase: 119 U/L (ref 38–126)
Anion gap: 11 (ref 5–15)
BUN: 11 mg/dL (ref 6–20)
CO2: 21 mmol/L — ABNORMAL LOW (ref 22–32)
Calcium: 9.1 mg/dL (ref 8.9–10.3)
Chloride: 104 mmol/L (ref 98–111)
Creatinine, Ser: 1.06 mg/dL (ref 0.61–1.24)
GFR, Estimated: >60 mL/min (ref >60)
Glucose, Bld: 139 mg/dL — ABNORMAL HIGH (ref 70–99)
Potassium: 4 mmol/L (ref 3.5–5.1)
Sodium: 136 mmol/L (ref 135–145)
Total Bilirubin: 0.5 mg/dL (ref 0.0–1.2)
Total Protein: 7 g/dL (ref 6.5–8.1)

## 2023-11-18 LAB — CBC
HCT: 43.6 % (ref 39.0–52.0)
Hemoglobin: 14.9 g/dL (ref 13.0–17.0)
MCH: 28.9 pg (ref 26.0–34.0)
MCHC: 34.2 g/dL (ref 30.0–36.0)
MCV: 84.7 fL (ref 80.0–100.0)
Platelets: 220 K/uL (ref 150–400)
RBC: 5.15 MIL/uL (ref 4.22–5.81)
RDW: 13.5 % (ref 11.5–15.5)
WBC: 8.3 K/uL (ref 4.0–10.5)
nRBC: 0 % (ref 0.0–0.2)

## 2023-11-18 LAB — URINALYSIS, ROUTINE W REFLEX MICROSCOPIC
Bilirubin Urine: NEGATIVE
Glucose, UA: NEGATIVE mg/dL
Ketones, ur: NEGATIVE mg/dL
Leukocytes,Ua: NEGATIVE
Nitrite: NEGATIVE
Protein, ur: NEGATIVE mg/dL
Specific Gravity, Urine: 1.01 (ref 1.005–1.030)
pH: 5.5 (ref 5.0–8.0)

## 2023-11-18 LAB — URINALYSIS, MICROSCOPIC (REFLEX)

## 2023-11-18 LAB — LIPASE, BLOOD: Lipase: 15 U/L (ref 11–51)

## 2023-11-18 MED ORDER — ONDANSETRON HCL 4 MG/2ML IJ SOLN
4.0000 mg | Freq: Once | INTRAMUSCULAR | Status: AC
  Administered 2023-11-18: 4 mg via INTRAVENOUS
  Filled 2023-11-18: qty 2

## 2023-11-18 MED ORDER — HYDROMORPHONE HCL 1 MG/ML IJ SOLN
0.5000 mg | Freq: Once | INTRAMUSCULAR | Status: AC
  Administered 2023-11-18: 0.5 mg via INTRAVENOUS
  Filled 2023-11-18: qty 1

## 2023-11-18 MED ORDER — ONDANSETRON 4 MG PO TBDP: 4.0000 mg | ORAL_TABLET | Freq: Three times a day (TID) | ORAL | 0 refills | Status: AC | PRN

## 2023-11-18 MED ORDER — SODIUM CHLORIDE 0.9 % IV BOLUS
1000.0000 mL | Freq: Once | INTRAVENOUS | Status: AC
  Administered 2023-11-18: 1000 mL via INTRAVENOUS

## 2023-11-18 MED ORDER — FENTANYL CITRATE PF 50 MCG/ML IJ SOSY
50.0000 ug | PREFILLED_SYRINGE | Freq: Once | INTRAMUSCULAR | Status: AC
  Administered 2023-11-18: 50 ug via INTRAVENOUS
  Filled 2023-11-18: qty 1

## 2023-11-18 MED ORDER — IOHEXOL 300 MG/ML  SOLN
100.0000 mL | Freq: Once | INTRAMUSCULAR | Status: AC | PRN
  Administered 2023-11-18: 100 mL via INTRAVENOUS

## 2023-11-18 NOTE — ED Triage Notes (Signed)
 Pt reports RUQ abdominal pain that started last night. Also endorses N&V.   Hx of liver transplant.

## 2023-11-18 NOTE — ED Provider Notes (Signed)
  EMERGENCY DEPARTMENT AT [facility] Provider Note   CSN: [id number] Arrival date & time: 11/18/23  1148     Patient presents with: Abdominal Pain   Trevor Reilly is a 53 y.o. male patient with history of liver transplant who presents to the emergency department today for further evaluation of right upper quadrant abdominal pain that radiates across the upper abdomen for the last 2 weeks but worse over the last several days.  Patient was unable to sleep secondary to pain.  He states last time this happened he had both an infection and his stent moved in the biliary tract.  He reports associated nausea and vomiting.  Denies any fever, chills, urinary symptoms, diarrhea. Denies alcohol use.     Abdominal Pain      Prior to Admission medications   Medication Sig Start Date End Date Taking? Authorizing Provider  ondansetron  (ZOFRAN -ODT) 4 MG disintegrating tablet Take 1 tablet (4 mg total) by mouth every 8 (eight) hours as needed for nausea or vomiting. 11/18/23  Yes Theotis, Jaileigh Weimer M, PA-C  albuterol  (VENTOLIN  HFA) 108 (90 Base) MCG/ACT inhaler Inhale 2 puffs into the lungs See admin instructions. Inhale 2 puffs into the lungs every 4-6 hours as needed for shortness of breath or wheezing    [provider]  CONSTULOSE  10 GM/15ML solution Take 20 g by mouth 3 (three) times daily.    [provider]  fluticasone  (FLONASE ) 50 MCG/ACT nasal spray Place 1 spray into both nostrils 2 (two) times daily. Patient taking differently: Place 2 sprays into both nostrils 2 (two) times daily. 01/09/22   Hunsucker, Donnice SAUNDERS, MD  furosemide  (LASIX ) 20 MG tablet Take 1 tablet (20 mg total) by mouth 2 (two) times daily. Patient taking differently: Take 60 mg by mouth in the morning. 02/08/22   Curatolo, Adam, DO  hydrOXYzine (ATARAX) 25 MG tablet Take 25 mg by mouth 3 (three) times daily as needed for anxiety.    [provider]  lidocaine  (LIDODERM ) 5 % Place  1 patch onto the skin daily. Remove & Discard patch within 12 hours or as directed by MD 11/05/22   Silver Wonda LABOR, PA  midodrine  (PROAMATINE ) 5 MG tablet Take 2 tablets (10 mg total) by mouth 2 (two) times daily with a meal. 04/23/22   Danford, Lonni SQUIBB, MD  oxyCODONE  (ROXICODONE ) 5 MG immediate release tablet Take 1 tablet (5 mg total) by mouth every 6 (six) hours as needed for severe pain or breakthrough pain. 11/05/22   Silver Wonda A, PA  pantoprazole  (PROTONIX ) 40 MG tablet TAKE 1 TABLET BY MOUTH TWICE DAILY . APPOINTMENT REQUIRED FOR FUTURE REFILLS 12/31/22   Charlanne Groom, MD  predniSONE  (DELTASONE ) 20 MG tablet Take 2 tablets (40 mg total) by mouth daily. 11/06/23   Theotis Peers M, PA-C  spironolactone  (ALDACTONE ) 50 MG tablet Take 1 tablet (50 mg total) by mouth daily. 02/08/22   Curatolo, Adam, DO  SYMBICORT 160-4.5 MCG/ACT inhaler Inhale 2 puffs into the lungs 2 (two) times daily.    [provider]  XIFAXAN  550 MG TABS tablet Take 1 tablet (550 mg total) by mouth 2 (two) times daily. 04/23/22   Danford, Lonni SQUIBB, MD    Allergies: Penicillins    Review of Systems  Gastrointestinal:  Positive for abdominal pain.  All other systems reviewed and are negative.   Updated Vital Signs BP (!) 140/90 (BP Location: Left Arm)   Pulse 95   Temp 98.1 F (36.7  C) (Oral)   Resp 20   SpO2 100%   Physical Exam Vitals and nursing note reviewed.  Constitutional:      General: He is not in acute distress.    Appearance: Normal appearance.  HENT:     Head: Normocephalic and atraumatic.  Eyes:     General:        Right eye: No discharge.        Left eye: No discharge.  Cardiovascular:     Comments: Regular rate and rhythm.  S1/S2 are distinct without any evidence of murmur, rubs, or gallops.  Radial pulses are 2+ bilaterally.  Dorsalis pedis pulses are 2+ bilaterally.  No evidence of pedal edema. Pulmonary:     Comments: Clear to auscultation bilaterally.  Normal  effort.  No respiratory distress.  No evidence of wheezes, rales, or rhonchi heard throughout. Abdominal:     General: Abdomen is flat. Bowel sounds are normal. There is no distension.     Tenderness: There is no guarding or rebound.     Comments: Upper abdominal distention.  Moderate tenderness to light palpation to the right upper quadrant.  Musculoskeletal:        General: Normal range of motion.     Cervical back: Neck supple.  Skin:    General: Skin is warm and dry.     Findings: No rash.  Neurological:     General: No focal deficit present.     Mental Status: He is alert.  Psychiatric:        Mood and Affect: Mood normal.        Behavior: Behavior normal.     (all labs ordered are listed, but only abnormal results are displayed) Labs Reviewed  COMPREHENSIVE METABOLIC PANEL WITH GFR - Abnormal; Notable for the following components:      Result Value   CO2 21 (*)    Glucose, Bld 139 (*)    All other components within normal limits  URINALYSIS, ROUTINE W REFLEX MICROSCOPIC - Abnormal; Notable for the following components:   Hgb urine dipstick TRACE (*)    All other components within normal limits  URINALYSIS, MICROSCOPIC (REFLEX) - Abnormal; Notable for the following components:   Bacteria, UA RARE (*)    All other components within normal limits  LIPASE, BLOOD  CBC    EKG: None  Radiology: CT ABDOMEN PELVIS W CONTRAST Result Date: 11/18/2023 CLINICAL DATA:  Abdominal pain. EXAM: CT ABDOMEN AND PELVIS WITH CONTRAST TECHNIQUE: Multidetector CT imaging of the abdomen and pelvis was performed using the standard protocol following bolus administration of intravenous contrast. RADIATION DOSE REDUCTION: This exam was performed according to the departmental dose-optimization program which includes automated exposure control, adjustment of the mA and/or kV according to patient size and/or use of iterative reconstruction technique. CONTRAST:  OMNIPAQUE  IOHEXOL  300 MG/ML   SOLN COMPARISON:  CT 06/28/2023 FINDINGS: Lower chest: Lung bases are clear. Hepatobiliary: Postcholecystectomy. There are apparently 2 parallel stents in the distal common bile duct which extend into the distal duodenum. There is no intrahepatic biliary duct dilatation. Multiple surgical clips along the margins of the RIGHT and LEFT hepatic lobe. The fluid collection along the posterior margin of the RIGHT hepatic lobe is decreased significantly in size measuring 25 x 13 mm compared to 44 x 25 mm. There is interval decrease in inflammation previously seen within the porta hepatis. Pancreas: Pancreatic duct is mildly prominent similar comparison exam. No fluid collections. No inflammation Spleen: Normal spleen Adrenals/urinary tract: Adrenal  glands and kidneys are normal. The ureters and bladder normal. Stomach/Bowel: Small hiatal hernia. Stomach, duodenum small-bowel normal. Biliary stent extends into the duodenum as described above. Small bowel normal without inflammation. Appendix. The colon and rectosigmoid colon are normal. Vascular/Lymphatic: Abdominal aorta is normal caliber. No periportal or retroperitoneal adenopathy. No pelvic adenopathy. Reproductive: Prostate unremarkable.   Calcified vas deferens. Other: No free fluid. Musculoskeletal: No aggressive osseous lesion. IMPRESSION: 1. Interval decrease in size of fluid collection along the posterior margin of the RIGHT hepatic lobe. 2. Interval decrease in inflammation in the porta hepatis. 3. Two parallel stents in the distal common bile duct extending into the distal common bile duct. No intrahepatic biliary duct dilatation. 4. No evidence of pancreatitis. 5. Small hiatal hernia. Electronically Signed   By: Jackquline Boxer M.D.   On: 11/18/2023 14:16    Procedures   Medications Ordered in the ED  HYDROmorphone  (DILAUDID ) injection 0.5 mg (has no administration in time range)  fentaNYL  (SUBLIMAZE ) injection 50 mcg (50 mcg Intravenous Given 11/18/23  1240)  ondansetron  (ZOFRAN ) injection 4 mg (4 mg Intravenous Given 11/18/23 1235)  sodium chloride  0.9 % bolus 1,000 mL (1,000 mLs Intravenous New Bag/Given 11/18/23 1232)  iohexol  (OMNIPAQUE ) 300 MG/ML solution 100 mL (100 mLs Intravenous Contrast Given 11/18/23 1306)     Medical Decision Making VASIL JUHASZ is a 53 y.o. male patient who presents to the emergency department today for further evaluation of upper abdominal pain.  Given the patient's liver transplant history and biliary duct obstruction I am concerned for this today.  Patient does not have a gallbladder.  Labs were initiated in triage interpreted by myself.  No evidence of leukocytosis to indicate some kind of infection.  No evidence of transaminitis at this time.  However, we will likely evaluate with a CT scan as there is significant tenderness on exam in the right upper quadrant.  There is some distention of the upper abdomen as well.  Will also give him some pain medication, Zofran  for nausea, and fluids.  On reevaluation, patient is feeling better.  Not exactly clear on the patient's cause of pain.  We have successfully ruled out any hepatobiliary complications from his liver transplant, appendicitis, ureterolithiasis, kidney infection, bowel obstruction.  Patient's pain has significantly improved after fentanyl .  Patient got some fluids as well.  Will plan to discharge him home with Zofran .  I will have him follow-up with his liver transplant team.  Strict return precautions were discussed.  He is safe for discharge.  Amount and/or Complexity of Data Reviewed Labs: ordered. Decision-making details documented in ED Course. Radiology: ordered. Decision-making details documented in ED Course.  Risk Prescription drug management.     Final diagnoses:  RUQ abdominal pain    ED Discharge Orders          Ordered    ondansetron  (ZOFRAN -ODT) 4 MG disintegrating tablet  Every 8 hours PRN        11/18/23 1459                Theotis Peers [place], [place] 11/18/23 1502    Freddi Hamilton, MD 11/19/23 [id number]

## 2023-11-18 NOTE — Discharge Instructions (Signed)
 As we discussed, I would like for you to follow-up with your liver transplant team.  Please call and schedule an appointment and stated that you were in the emergency department.  Please return to the emergency department sooner for any worsening symptoms.

## 2024-03-15 ENCOUNTER — Other Ambulatory Visit: Payer: Self-pay | Admitting: Gastroenterology
